# Patient Record
Sex: Female | Born: 1975 | Race: White | Hispanic: No | Marital: Married | Smoking: Never smoker
Health system: Southern US, Community
[De-identification: ages and names within clinical notes are randomized; demographics above are authoritative.]

## PROBLEM LIST (undated history)

## (undated) DIAGNOSIS — N83209 Unspecified ovarian cyst, unspecified side: Secondary | ICD-10-CM

## (undated) DIAGNOSIS — N809 Endometriosis, unspecified: Secondary | ICD-10-CM

## (undated) DIAGNOSIS — Q615 Medullary cystic kidney: Secondary | ICD-10-CM

## (undated) DIAGNOSIS — D8989 Other specified disorders involving the immune mechanism, not elsewhere classified: Secondary | ICD-10-CM

## (undated) DIAGNOSIS — I10 Essential (primary) hypertension: Secondary | ICD-10-CM

## (undated) DIAGNOSIS — N393 Stress incontinence (female) (male): Secondary | ICD-10-CM

## (undated) DIAGNOSIS — T7840XA Allergy, unspecified, initial encounter: Secondary | ICD-10-CM

## (undated) DIAGNOSIS — K5090 Crohn's disease, unspecified, without complications: Secondary | ICD-10-CM

## (undated) DIAGNOSIS — I7300 Raynaud's syndrome without gangrene: Secondary | ICD-10-CM

## (undated) DIAGNOSIS — O1490 Unspecified pre-eclampsia, unspecified trimester: Secondary | ICD-10-CM

## (undated) HISTORY — DX: Unspecified pre-eclampsia, unspecified trimester: O14.90

## (undated) HISTORY — PX: CHOLECYSTECTOMY: SHX55

## (undated) HISTORY — DX: Stress incontinence (female) (male): N39.3

## (undated) HISTORY — DX: Crohn's disease, unspecified, without complications: K50.90

## (undated) HISTORY — DX: Allergy, unspecified, initial encounter: T78.40XA

## (undated) HISTORY — DX: Endometriosis, unspecified: N80.9

## (undated) HISTORY — DX: Essential (primary) hypertension: I10

## (undated) HISTORY — PX: DILATION AND CURETTAGE OF UTERUS: SHX78

## (undated) HISTORY — PX: TUBAL LIGATION: SHX77

## (undated) HISTORY — DX: Medullary cystic kidney: Q61.5

## (undated) HISTORY — DX: Unspecified ovarian cyst, unspecified side: N83.209

## (undated) HISTORY — DX: Raynaud's syndrome without gangrene: I73.00

## (undated) HISTORY — PX: NOVASURE ABLATION: SHX5394

## (undated) HISTORY — PX: ENDOMETRIAL ABLATION: SHX621

---

## 2003-07-13 ENCOUNTER — Emergency Department (HOSPITAL_COMMUNITY): Admission: EM | Admit: 2003-07-13 | Discharge: 2003-07-14 | Payer: Self-pay | Admitting: Emergency Medicine

## 2004-02-16 ENCOUNTER — Inpatient Hospital Stay (HOSPITAL_COMMUNITY): Admission: AD | Admit: 2004-02-16 | Discharge: 2004-02-16 | Payer: Self-pay | Admitting: Obstetrics and Gynecology

## 2004-05-19 ENCOUNTER — Inpatient Hospital Stay (HOSPITAL_COMMUNITY): Admission: AD | Admit: 2004-05-19 | Discharge: 2004-05-19 | Payer: Self-pay | Admitting: Obstetrics and Gynecology

## 2004-05-20 ENCOUNTER — Inpatient Hospital Stay (HOSPITAL_COMMUNITY): Admission: AD | Admit: 2004-05-20 | Discharge: 2004-05-20 | Payer: Self-pay | Admitting: Obstetrics and Gynecology

## 2004-05-29 ENCOUNTER — Inpatient Hospital Stay (HOSPITAL_COMMUNITY): Admission: AD | Admit: 2004-05-29 | Discharge: 2004-06-06 | Payer: Self-pay | Admitting: Obstetrics & Gynecology

## 2004-06-11 ENCOUNTER — Inpatient Hospital Stay (HOSPITAL_COMMUNITY): Admission: AD | Admit: 2004-06-11 | Discharge: 2004-06-11 | Payer: Self-pay | Admitting: Obstetrics and Gynecology

## 2004-06-21 ENCOUNTER — Inpatient Hospital Stay (HOSPITAL_COMMUNITY): Admission: AD | Admit: 2004-06-21 | Discharge: 2004-06-23 | Payer: Self-pay | Admitting: Obstetrics and Gynecology

## 2004-08-15 ENCOUNTER — Ambulatory Visit (HOSPITAL_COMMUNITY): Admission: RE | Admit: 2004-08-15 | Discharge: 2004-08-15 | Payer: Self-pay | Admitting: Obstetrics and Gynecology

## 2007-10-17 ENCOUNTER — Ambulatory Visit (HOSPITAL_COMMUNITY): Admission: RE | Admit: 2007-10-17 | Discharge: 2007-10-17 | Payer: Self-pay | Admitting: Obstetrics and Gynecology

## 2007-10-17 ENCOUNTER — Encounter (INDEPENDENT_AMBULATORY_CARE_PROVIDER_SITE_OTHER): Payer: Self-pay | Admitting: Obstetrics and Gynecology

## 2009-07-02 ENCOUNTER — Ambulatory Visit (HOSPITAL_COMMUNITY): Admission: RE | Admit: 2009-07-02 | Discharge: 2009-07-02 | Payer: Self-pay | Admitting: General Surgery

## 2010-01-22 ENCOUNTER — Ambulatory Visit: Payer: Self-pay | Admitting: Family Medicine

## 2010-01-22 DIAGNOSIS — M9981 Other biomechanical lesions of cervical region: Secondary | ICD-10-CM | POA: Insufficient documentation

## 2010-01-22 DIAGNOSIS — I10 Essential (primary) hypertension: Secondary | ICD-10-CM | POA: Insufficient documentation

## 2010-01-22 DIAGNOSIS — M999 Biomechanical lesion, unspecified: Secondary | ICD-10-CM | POA: Insufficient documentation

## 2010-01-22 DIAGNOSIS — Q615 Medullary cystic kidney: Secondary | ICD-10-CM | POA: Insufficient documentation

## 2010-01-22 LAB — CONVERTED CEMR LAB
ALT: 12 units/L
AST: 13 units/L
Albumin: 4.4 g/dL
Alkaline Phosphatase: 82 units/L
BUN: 16 mg/dL
CO2: 25 meq/L
Calcium: 9 mg/dL
Chloride: 103 meq/L
Cholesterol: 150 mg/dL
Creatinine, Ser: 0.8 mg/dL
HCT: 42 %
HDL: 46 mg/dL
Hemoglobin: 14.1 g/dL
LDL Cholesterol: 87 mg/dL
MCV: 91.3 fL
Platelets: 298 10*3/uL
Potassium: 3.8 meq/L
RDW: 13 %
Sodium: 138 meq/L
Total Bilirubin: 0.4 mg/dL
Triglycerides: 86 mg/dL
VLDL: 17 mg/dL
WBC: 10.1 10*3/uL

## 2010-02-17 ENCOUNTER — Ambulatory Visit: Payer: Self-pay | Admitting: Family Medicine

## 2010-03-11 ENCOUNTER — Encounter
Admission: RE | Admit: 2010-03-11 | Discharge: 2010-04-07 | Payer: Self-pay | Source: Home / Self Care | Attending: Orthopedic Surgery | Admitting: Orthopedic Surgery

## 2010-03-30 ENCOUNTER — Encounter: Payer: Self-pay | Admitting: Obstetrics & Gynecology

## 2010-04-08 NOTE — Assessment & Plan Note (Signed)
Summary: new pt/employee/per Dr. Smith/adjustment/bmc   Vital Signs:  Patient profile:   35 year old female Height:      63.25 inches Weight:      133 pounds BMI:     23.46 BSA:     1.63 Temp:     99.2 degrees F Pulse rate:   84 / minute BP sitting:   121 / 69  Vitals Entered By: Jone Baseman CMA (January 22, 2010 9:17 AM) CC: NP/ manipulation Is Patient Diabetic? No Pain Assessment Patient in pain? yes     Location: right side Intensity: 3   Primary Care Parvin Stetzer:  Antoine Primas DO  CC:  NP/ manipulation.  History of Present Illness: 35 yo female here to establish care.  1. HTN   BP today: WNL Taking Meds:no notices exercise keeps control Side Effects: no ROS: denies Headache visual changes nausea vomiting abdominal pain numbness in extremities    2.  Medullary sponge kidney-  Pt had this found when she was having CT scan for nonspecific abdominal pain she has noticed when she has caffiene. Has seen a nephologist for it in the past.   3.  Back pain  Onset:gradaul usually worse at the end of a long shift.  Location: righ sided low Aggrevating factors: long shifts, lifting certain things.  Relieving factors: motrin Does pain radiate and where:sometime down the back of the hip any known hx of back pain:no [facility]  Impeding ADL's: no ROS: Denies loss of function of extremities, numbness  in extremities, bowel or bladder problems,  dysuria, SOB      Habits & Providers  Alcohol-Tobacco-Diet     Tobacco Status: never  Current Medications (verified): 1)  None  Allergies (verified): No Known Drug Allergies  Past History:  Past Medical History: medullary spnge kidney HTn no meds hx of depression resolved  Past Surgical History: ex lap for non specific abdominal pain Cholecystectomy BTL in 2006  Family History: breast cancer in mother 53 CAD in father HTN  Social History: married 3  children 2000, 2002, 2006 works at [employer] Status:  never  Review of Systems       denies fever, chills, nausea, vomiting, diarrhea or constipation   Physical Exam  General:  Well-developed,well-nourished,in no acute distress; alert,appropriate and cooperative throughout examination Eyes:  No corneal or conjunctival inflammation noted. EOMI. Perrla.  Ears:  External ear exam shows no significant lesions or deformities.  Otoscopic examination reveals clear canals, tympanic membranes are intact bilaterally without bulging, retraction, inflammation or discharge. Hearing is grossly normal bilaterally. Mouth:  MMM Neck:  supple no LAD Lungs:  CTAB Heart:  RRR no murmur Abdomen:  BS+, NT, ND no masses Msk:  5/5 strength in all extremities  OMT findings C2L C5R T1 eRS right, T5 RS right  L2 RS left Sacrum left on left.   Pulses:  2+ Extremities:  no edema   Impression & Recommendations:  Problem # 1:  NONALLOPATHIC LESION OF SACRAL REGION NEC (ICD-739.4) HVLA improved  standing and seated flexion test Orders: OMT 3-4 Body Regions (16109)  Problem # 2:  NONALLOPATHIC LESION OF CERVICAL REGION NEC (ICD-739.1) HVLA improved.  Orders: OMT 3-4 Body Regions [phone])  Problem # 3:  NONALLOPATHIC LESION OF THORACIC REGION NEC (ICD-739.2) HVLA improved all manipulation was improved after verbal consent was obtained.  Orders: OMT 3-4 Body Regions [phone])  Problem # 4:  ESSENTIAL HYPERTENSION, BENIGN (ICD-401.1) at goal on no medications  at this time.    Orders Added: 1)  [facility]- New Level 4 [99204] 2)  OMT 3-4 Body Regions [10272]

## 2010-04-10 NOTE — Assessment & Plan Note (Signed)
Summary: flu sx,df   Vital Signs:  Patient profile:   35 year old female Height:      63.25 inches Weight:      134 pounds BMI:     23.63 Temp:     97.7 degrees F oral Pulse rate:   91 / minute BP sitting:   128 / 87  (right arm) Cuff size:   regular  Vitals Entered By: Tessie Fass CMA (February 17, 2010 1:45 PM) CC: cough and congestion and headache Pain Assessment Patient in pain? yes     Location: head Intensity: 7   Primary Care Teauna Dubach:  Antoine Primas DO  CC:  cough and congestion and headache.  History of Present Illness: 6 days of sinus congestion getting worse instead of bettter.  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  Cough is not severe.  Deneis fever.  Current Medications (verified): 1)  Doxycycline Hyclate 100 Mg Tabs (Doxycycline Hyclate) .... One Tab Two Times A Day For 7 Days  Allergies (verified): No Known Drug Allergies  Review of Systems General:  Denies chills and fever. ENT:  Complains of hoarseness, nasal congestion, postnasal drainage, sinus pressure, and sore throat. Resp:  Denies cough.  Physical Exam  General:  Congested, in no acute distress Ears:  TM retracted Nose:  decreased air movement Mouth:  + post nasal draiange Neck:  no adenopathy Lungs:  normal respiratory effort and normal breath sounds.  Decreased breath sounds in right lower lobe  Heart:  normal rate and no murmur.     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  Recommended 12 hour nasal spray at bedtime for 2-3 nights. Her updated medication list for this problem includes:    Doxycycline Hyclate 100 Mg Tabs (Doxycycline hyclate) ..... One tab two times a day for 7 days  Orders: [facility]- Est Level  3 (04540)  Complete Medication List: 1)  Doxycycline Hyclate 100 Mg Tabs (Doxycycline hyclate) ....  One tab two times a day for 7 days  Patient Instructions: 1)  Drink a lot of fluids 2)  eat before doxycycline Prescriptions: DOXYCYCLINE HYCLATE 100 MG TABS (DOXYCYCLINE HYCLATE) one tab two times a day for 7 days  #14 x 0   Entered and Authorized by:   Luretha Murphy NP   Signed by:   Luretha Murphy NP on 02/17/2010   Method used:   Electronically to        [facility]* (retail)       [address]       [address] Shipping/mailing       [place], [place]  [address]       Ph: [phone]       Fax: [phone]   RxID:   [id number]    Orders Added: 1)  [facility]- Est Level  3 [72536]

## 2010-04-12 ENCOUNTER — Encounter: Payer: Self-pay | Admitting: *Deleted

## 2010-05-27 LAB — PREGNANCY, URINE: Preg Test, Ur: NEGATIVE

## 2010-07-22 NOTE — Op Note (Signed)
[name], [name]                ACCOUNT NO.:  [id number]   MEDICAL RECORD NO.:  [id number]          PATIENT TYPE:  AMB   LOCATION:  SDC                           [facility]   PHYSICIAN:  Lenoard Aden, M.D.DATE OF BIRTH:  Aug 05, 1975   DATE OF PROCEDURE:  DATE OF DISCHARGE:                               OPERATIVE REPORT   PREOPERATIVE DIAGNOSES:  1. Dysmenorrhea.  2. Menorrhagia.  3. Right lower quadrant pain.   POSTOPERATIVE DIAGNOSES:  1. Dysmenorrhea.  2. Menorrhagia.  3. Right lower quadrant pain of unknown etiology.   PROCEDURES:  1. Diagnostic hysteroscopy.  2. D&C.  3. NovaSure endometrial ablation.  4. Diagnostic laparoscopy.   SURGEON:  Lenoard Aden, MD   ANESTHESIA:  General.   ESTIMATED BLOOD LOSS:  Less than 50 mL.   FLUID DEFICIT:  Less than 50 mL.   COMPLICATIONS:  None.   SPECIMEN:  Endometrial curettings to pathology.   The patient to recovery in good condition.   BRIEF OPERATIVE NOTE:  After being apprised of risks of anesthesia,  infection, bleeding, injury to abdominal organs, need for repair, the  labor's immediate complications to include bowel and bladder injury, and  inability to cure pelvic pain, the patient was brought to the operating  room where she was administered a general anesthetic without  complications.  She was prepped and draped in the usual sterile fashion  and catheterized until the bladder was emptied.  After achieving  adequate anesthesia, a weighted speculum placed per vagina.  Dilute  Pitressin solution was placed at 3 and 9 o'clock cervicovaginal  junction.  Cervical length of 2 and uterine length of 8 cm were noted.  Cervix was easily dilated up to a #25 Pratt dilator.  Hysteroscope  placed.  Visualization reveals a thin endometrium, and otherwise, normal  endometrial cavity.  Hysteroscope was removed.  Endometrium curettings  were collected in a normal 4-quadrant method using sharp curettage.  Good  hemostasis was noted.  Revisualization reveals an otherwise normal  endometrial cavity.  Hysteroscope was removed.  NovaSure was placed and  seated in appropriate fashion.  CO2 test was performed and is negative.  Procedure was initiated in a standard fashion.  NovaSure was completed  without difficulty and removed atraumatically.  Revisualization reveals  an otherwise normal endometrial cavity.  Post-ablation, no evidence of  uterine perforation.  Hulka tenaculum placed.  Attention turned to the  abdomen whereby an infraumbilical incision was made with a scalpel.  Veress needle placed with an opening pressure of -1 is noted.  A 3.5 L  of CO2 was insufflated  without difficulty.  Trocars placed  atraumatically.  Visualization reveals a normal liver, gallbladder bed,  normal appendix, normal bowel, and there were some bilateral tubal  ligation site as noted.  Normal anterior and posterior cul-de-sac.  Normal left and right ovary.  Small simple cyst appearing on the left  ovary.  At this time, revisualization reveals no evidence of uterine  perforation and normal cul-de-sac was noted.  A 5-mm trocar site, which  was made suprapubically under direct visualization.  This was used to  manipulate bowel and the ovaries.  Both ureters were peristalsing  normally.  No evidence of any adhesions into the pelvic and cul-de-sac.  At this time, instruments were then removed under direct visualization  CO2 having been released.  Incisions were closed using a 0 Vicryl and  Dermabond.  Instruments were then removed from the vagina.  The patient  was awakened and transferred to recovery in good condition.      Lenoard Aden, M.D.  Electronically Signed     RJT/MEDQ  D:  10/17/2007  T:  10/18/2007  Job:  [id number]

## 2010-07-25 NOTE — Consult Note (Signed)
[name], [name]                ACCOUNT NO.:  [id number]   MEDICAL RECORD NO.:  [id number]          PATIENT TYPE:  MAT   LOCATION:  MATC                          [facility]  [facility]   PHYSICIAN:  Lenoard Aden, M.D.DATE OF BIRTH:  1975/11/17   DATE OF CONSULTATION:  06/11/2004  DATE OF DISCHARGE:                                   CONSULTATION   EMERGENCY ROOM CONSULTATION:   CHIEF COMPLAINT:  Acute onset of blurred vision.   HISTORY OF PRESENT ILLNESS:  [age]-year-old white female G5, P2 EDD Jul 11, 2004 at 35-6/[redacted] weeks gestation with known chronic hypertension versus  pregnancy-induced hypertension who presents with aforementioned neurologic  symptoms.  She denies headaches or epigastric pain, nausea, vomiting.  She  reports good fetal movement.  She has medications to include labetalol and  prenatal vitamins.  She has a history of two vaginal deliveries and two  pregnancy losses.  She does have a history of hypertension with both  pregnancies in addition to preterm contractions.   Prenatal course has been complicated by probable gestational versus chronic  hypertension and previously abnormal 24-hour urine which was recently  normalized at less than 300 mg in a 24-hour specimen.  Normal PIH labs have  been previously noted.   PHYSICAL EXAMINATION:  She is a well-developed, well-nourished white female  in no acute distress.  Blood pressure ranging from 120s/80s to one value of  150/100 upon presentation.  Lungs clear.  Heart regular rhythm.  Abdomen  soft, no __________ tenderness, gravid, nontender.  DTRs 2 to 3+, no clonus,  trace pretibial edema.  Cervix is 2 cm, 70%, vertex, -1 and posterior.  Neurologic exam is nonfocal.  Peripheral vision changes have improved at  this time.   NST is reactive.  CBC within normal limits.  Platelet count of 180,000.  Chemistries are pending.   IMPRESSION:  Chronic versus gestational hypertension, rule out superimposed  preeclampsia, stabilization of blood pressure noted.   PLAN:  Check chemistry panel.  If normal we will discharge home on continued  bed rest, labetalol and follow up for 24-hour urine as scheduled in the  office within 24 hours.      RJT/MEDQ  D:  06/11/2004  T:  06/11/2004  Job:  [id number]

## 2010-07-25 NOTE — Op Note (Signed)
[name], [name]                ACCOUNT NO.:  [id number]   MEDICAL RECORD NO.:  [id number]          PATIENT TYPE:  AMB   LOCATION:  SDC                           [facility]   PHYSICIAN:  Lenoard Aden, M.D.DATE OF BIRTH:  [birth year]   DATE OF PROCEDURE:  08/15/2004  DATE OF DISCHARGE:                                 OPERATIVE REPORT   PREOPERATIVE DIAGNOSIS:  Desire for elective sterilization.   POSTOPERATIVE DIAGNOSIS:  Desire for elective sterilization.   PROCEDURE:  Laparoscopic tubal ligation.   SURGEON:  Lenoard Aden, M.D.   ANESTHESIA:  General.   ESTIMATED BLOOD LOSS:  Less than 50 mL.   COMPLICATIONS:  None.   DRAINS:  None.   COUNTS:  Correct.   Patient to recovery in good condition.   FINDINGS:  Normal uterus.  Bilateral normal tubes and ovaries.  Normal-  appearing appendix.  Normal liver, gallbladder bed.  No evidence of  adhesions.   BRIEF OPERATIVE NOTE:  After being apprised of the risks and benefits of  anesthesia, infection, bleeding, injury to abdominal organs and need for  repair, failure rate of tubal ligation five to 10 per 1000, the patient was  brought to the operating room, where she was administered a general  anesthetic without complications, prepped and draped in the usual sterile  fashion, catheterized until the bladder is empty.  Exam under anesthesia  reveals an anteflexed uterus and no adnexal masses.  A Hulka tenaculum  placed per vagina.  An infraumbilical incision made with the scalpel, a  Veress needle placed, opening pressure of -1 noted, 4.2 L of CO2 insufflated  without difficulty.  Atraumatic trocar placement visualized, pictures taken.  Findings as noted above.  Right tube traced out to the fimbriated end,  cauterized with bipolar cautery in three contiguous portions of the  ampullary-isthmic portion of the tube.  The same procedure done on the left  tube.  Both tubes are divided, tubal lumens are visualized.  Good  hemostasis  is noted.  CO2 is released.  All instruments are removed under direct visualization,  incision closed using a 0 Vicryl and Dermabond.  Marcaine solution placed 10  mL total.  Instruments removed from the vagina.  The patient tolerates the  procedure well, transferred to recovery in good condition.       RJT/MEDQ  D:  08/15/2004  T:  08/15/2004  Job:  [id number]

## 2010-07-25 NOTE — Consult Note (Signed)
NAMEPHILICIA, [name]                            ACCOUNT NO.:  [id number]   MEDICAL RECORD NO.:  [id number]                   PATIENT TYPE:  EMS   LOCATION:  MAJO                                 FACILITY:  [facility]   PHYSICIAN:  Genene Churn. Love, M.D.                 DATE OF BIRTH:  08-18-1975   DATE OF CONSULTATION:  07/14/2003  DATE OF DISCHARGE:  07/14/2003                                   CONSULTATION   REFERRING PHYSICIAN:  Lorre Nick, M.D.   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  Seen in the  emergency room at the request of Lorre Nick, M.D., for evaluation of  blurred vision and abnormal CT scan.   HISTORY OF PRESENT ILLNESS:  This patient has been in good health her entire  life.  About 7:15 p.m., she felt pressured while at work.  She noted a glare  to the computer screen.  She had poor ability to focus.  She noted a  lightheaded sensation, fussy vision in her left eye, and a sensation as if  she was going to faint.  She was seen at the [facility] Emergency Room  by a P.A. with diastolic blood pressures of 102 and was referred to [facility].  A CT scan showed evidence of calcification versus blood in  the right basal ganglion region of the CT and neurology was called.  The  patient has had no evidence of migraine.  Denies any family history of  aneurysms.  She has possibly had some migraines in the past, but no car  sickness as a child.   PAST MEDICAL HISTORY:  Significant for:  1. Preeclampsia.  2. Endometriosis.   CURRENT MEDICATIONS:  Her only current medication is Ortho Evra birth  control patch.   SOCIAL HISTORY:  She drinks about one drink of alcohol per month.  She is  married and has two children.  She does not smoke cigarettes.   PHYSICAL EXAMINATION:  A well-developed white female.  Blood pressure  sitting in right and leg arms 120/80, heart  rate 64 and regular.  No bruits  were heard.  On mental status, she was alert and oriented x 3.  She followed  one, two, and three-step commands.  The cranial nerve examination revealed  visual fields full.  Disks flat.  Extraocular movements full.  Corneals  full.  Facial sensation equal.  No facial motor asymmetry.  Hearing intact.  Air conduction greater than bone conduction.  Tongue midline.  Uvula  midline.  Gags present.  Sternocleidomastoid and trapezius testing normal.  Motor examination with 5/5 strength proximally and distally in the upper and  lower extremities.  No evidence of drift.  Coordination testing normal.  Sensory examination intact.  Deep tendon reflexes 1-2+.  Plantar responses  downgoing.  Gait examination  not evaluated.   LABORATORY DATA:  EKG showed normal sinus rhythm and right axis deviation.  Her white blood cell count was 12,500, hemoglobin 12.7, hematocrit 37.2, and  platelet count 307,000.  Sodium 139, potassium 3.4, chloride 106, CO2  content 25, glucose 111, BUN 9, creatinine 0.8, calcium 8.7.  Urinalysis was  unremarkable.  MRI study of the brain showed no evidence of mastoiditis.  CT  scan showed question of a small hemorrhage versus calcification in the basal  ganglion.   IMPRESSION:  1. Visual disturbance possibly representing migraine aura with blurred     vision.  368.40  2. Abnormal CT scan not showing abnormality on MRI.   PLAN:  1. Allow the patient to return home.  2. She will follow up on a p.r.n. basis.                                               Genene Churn. Sandria Manly, M.D.    JML/MEDQ  D:  07/14/2003  T:  07/15/2003  Job:  [id number]

## 2010-07-25 NOTE — Discharge Summary (Signed)
[name], [name]                ACCOUNT NO.:  [id number]   MEDICAL RECORD NO.:  [id number]          PATIENT TYPE:  INP   LOCATION:  9155                          [facility]   PHYSICIAN:  Lenoard Aden, M.D.DATE OF BIRTH:  October 30, 1975   DATE OF ADMISSION:  05/29/2004  DATE OF DISCHARGE:  06/06/2004                                 DISCHARGE SUMMARY   HOSPITAL COURSE:  The patient underwent admission for pregnancy-induced  hypertension and preterm contractions on May 29, 2004. Expectant  management within normal limits. Labs normalized, 24-hour urine improved.  Fetal heart rate status within normal limits. She was discharged to home on  hospital day #8. Follow up in the office in 1 week. Discharge medications  include labetalol, prenatal vitamins. Follow up in the office within 1 week  as noted.      [name]/MEDQ  D:  08/05/2004  T:  08/05/2004  Job:  [id number]

## 2010-07-25 NOTE — H&P (Signed)
[name], [name]                ACCOUNT NO.:  [id number]   MEDICAL RECORD NO.:  [id number]          PATIENT TYPE:  INP   LOCATION:  9155                          [facility]   PHYSICIAN:  Lenoard Aden, M.D.DATE OF BIRTH:  May 30, 1975   DATE OF ADMISSION:  05/29/2004  DATE OF DISCHARGE:                                HISTORY & PHYSICAL   HISTORY OF PRESENT ILLNESS:  A 35 year old G5 P2, [redacted] weeks gestation; EDD  Jul 11, 2004; with mild preeclampsia and new onset blood pressure lability.  Medications include prenatal vitamins, Zoloft, Procardia XL, and Zantac.  Past obstetric history is remarkable for two uncomplicated pregnancy losses  and two vaginal deliveries complicated by preterm labor and pregnancy-  induced hypertension. She has family history of hypertension, heart disease,  birth defects, and she does have a personal history of chickenpox as a  child.   PHYSICAL EXAMINATION:  VITAL SIGNS:  Weight is 160, blood pressure 150/104,  no proteinuria noted.  HEENT:  Normal.  LUNGS:  Clear.  HEART:  Regular rate and rhythm.  ABDOMEN:  Soft, gravid, nontender. Estimated fetal weight on May 15, 2004  of 4 pounds 8 ounces.  PELVIC:  Cervix is closed, soft, 2 cm long, vertex, -1.  EXTREMITIES:  DTRs 2-3+, no evidence of clonus.  NEUROLOGIC:  Nonfocal.   The patient is status post betamethasone on March 13 and March 14.   IMPRESSION:  1.  Thirty-four-week OB.  2.  Mild preeclampsia, questionable element of chronic hypertension with      blood pressure lability.  3.  Previous history of pregnancy-induced hypertension.  4.  Situational depression, stable on Zoloft.  5.  Chronic gastroesophageal reflux, stable on Zantac.   PLAN:  Check PIH panel and 24-hour urine today. Continue Procardia XL. If  PIH panel and 24-hour urine are stable, consider adding labetalol for blood  pressure lability. Continue serial PIH labs and repeat 24-hour urine as  noted.      RJT/MEDQ  D:   05/29/2004  T:  05/29/2004  Job:  [id number]

## 2010-07-25 NOTE — H&P (Signed)
[name], [name]                ACCOUNT NO.:  [id number]   MEDICAL RECORD NO.:  [id number]          PATIENT TYPE:  INP   LOCATION:  9169                          [facility]   PHYSICIAN:  Lenoard Aden, M.D.DATE OF BIRTH:  01/25/1976   DATE OF ADMISSION:  06/21/2004  DATE OF DISCHARGE:                                HISTORY & PHYSICAL   CHIEF COMPLAINT:  Gestational hypertension versus chronic hypertension now  at 37 weeks for indicated induction.   HISTORY OF PRESENT ILLNESS:  The patient is a 35 year old white female, G5,  P2-0-2-2, EDD Jul 11, 2004 at [redacted] weeks gestation with labile hypertension on  labetalol for induction.   OBSTETRICAL HISTORY:  Remarkable for two vaginal deliveries complicated by  preterm labor, one complicated by preeclampsia currently with magnesium  required after one of these vaginal deliveries.   FAMILY HISTORY:  Noncontributory.   SOCIAL HISTORY:  Noncontributory.   PRENATAL LABORATORY DATA:  Uncomplicated.   MEDICATIONS:  Zoloft, Labetalol, Pepcid, and prenatal vitamins.   HOSPITAL COURSE:  Preterm course complicated by hospitalization for elevated  blood pressures and proteinuria. Previous proteinuria was greater than 300  mg and a 24-hour specimen consistent with possible mild preeclampsia and  subsequent proteinuria under 300 mg 24-hour specimen. All labs previously  within normal limits. She was placed on Procardia early for increased  contractions and blood pressure control and switched to labetalol after  headaches due to her Procardia were noted.   PHYSICAL EXAMINATION:  VITAL SIGNS:  Today on exam, blood pressure 135/89.  HEENT:  Normal.  LUNGS:  Clear.  HEART:  Regular rate and rhythm.  ABDOMEN:  Soft, gravid, and nontender. Fetal weight 7 pounds.  PELVIC:  Cervix is 2-3 cm, 2 cm on vertex, -1.  EXTREMITIES:  Without cords.  NEUROLOGICAL:  Nonfocal.   IMPRESSION:  Gestational hypertension versus chronic hypertension, now  at  term on labetalol.   PLAN:  Proceed with induction, magnesium, on prophylaxis as needed. Continue  labetalol as needed postpartum. The patient desires postpartum tubal  ligation. Risks, benefits have been discussed. If blood pressure is well  controlled, we will consider postpartum tubal ligation. Risks of anesthesia,  infection, bleeding, injury to abdominal organs, need for repair previously  discussed. Failure risk of tubal ligation 5 to 10:1000 noted. The patient  acknowledges and will proceed.      RJT/MEDQ  D:  06/21/2004  T:  06/21/2004  Job:  [id number]

## 2010-07-25 NOTE — H&P (Signed)
[name], [name]                ACCOUNT NO.:  [id number]   MEDICAL RECORD NO.:  [id number]           PATIENT TYPE:   LOCATION:                                 FACILITY:   PHYSICIAN:  Lenoard Aden, M.D.     DATE OF BIRTH:   DATE OF ADMISSION:  08/15/2004  DATE OF DISCHARGE:                                HISTORY & PHYSICAL   CHIEF COMPLAINT:  Desire for elective sterilization.   HISTORY OF PRESENT ILLNESS:  The patient is a 35 year old white female, G5,  P3-0-2-3, status post uncomplicated vaginal delivery, June 21, 2004, for  tubal ligation.  History of 3 vaginal deliveries.   FAMILY HISTORY AND SOCIAL HISTORY:  Noncontributory.   PRENATAL LABORATORY DATA:  Uncomplicated.   MEDICATIONS:  1.  Zoloft.  2.  Prenatal vitamins.   PHYSICAL EXAMINATION:  GENERAL:  She is a well-developed, well-nourished,  white female in no acute distress.  HEENT:  Normal.  LUNGS:  Clear.  HEART:  Regular rate and rhythm.  ABDOMEN:  Soft, nontender.  PELVIC:  A small anteflexed uterus and no adnexal masses.   IMPRESSION:  Desire for elective sterilization.   PLAN:  Proceed with laparoscopic tubal ligation.  Risks of anesthesia,  infection, bleeding, injury to abdominal organs and need for repair was  discussed.  Delayed versus immediate complications to include bowel and  bladder injury noted with possible need for repair, failure risk of tubal  ligation of 5-10 per 1000 noted.  The patient acknowledges and wishes to  proceed.       RJT/MEDQ  D:  08/14/2004  T:  08/14/2004  Job:  [id number]

## 2010-08-14 ENCOUNTER — Ambulatory Visit (INDEPENDENT_AMBULATORY_CARE_PROVIDER_SITE_OTHER): Payer: 59 | Admitting: Family Medicine

## 2010-08-14 ENCOUNTER — Encounter: Payer: Self-pay | Admitting: Family Medicine

## 2010-08-14 DIAGNOSIS — J309 Allergic rhinitis, unspecified: Secondary | ICD-10-CM | POA: Insufficient documentation

## 2010-08-14 DIAGNOSIS — A499 Bacterial infection, unspecified: Secondary | ICD-10-CM

## 2010-08-14 DIAGNOSIS — B9689 Other specified bacterial agents as the cause of diseases classified elsewhere: Secondary | ICD-10-CM

## 2010-08-14 DIAGNOSIS — J329 Chronic sinusitis, unspecified: Secondary | ICD-10-CM

## 2010-08-14 MED ORDER — FLUTICASONE FUROATE 27.5 MCG/SPRAY NA SUSP: 2.0000 | Freq: Every day | NASAL | Status: DC

## 2010-08-14 MED ORDER — FEXOFENADINE HCL 60 MG PO TABS: 60.0000 mg | ORAL_TABLET | Freq: Two times a day (BID) | ORAL | Status: DC

## 2010-08-14 MED ORDER — AMOXICILLIN-POT CLAVULANATE 875-125 MG PO TABS: 1.0000 | ORAL_TABLET | Freq: Two times a day (BID) | ORAL | Status: AC

## 2010-08-14 NOTE — Assessment & Plan Note (Signed)
Acute secondary bacterial sinusitis in setting of baseline viral URI. Will treat with 7 day course of augmentin. Will also start pt on flonase and allegra in setting of baseline allergic rhinitis. Stressed importance of daily compliance of chronic medications. Red flags discussed. Pt agreeable to plan.

## 2010-08-14 NOTE — Assessment & Plan Note (Signed)
flonase and allegra in setting of baseline allergic rhinitis. Stressed importance of daily compliance of chronic medications

## 2010-08-14 NOTE — Progress Notes (Signed)
  Subjective:    Patient ID: [name], female    DOB: June 25, 1975, 35 y.o.   MRN: [id number]  HPI Sinusitis x 3-4 weeks. Initially had viral URI 1-2 weeks ago that mildly resolved. Pt then noticed recurrence of sinus pressure and congestion. + Purulent sinus drainage. + Purulent post nasal drip. Mild cough. No fevers. Mild chills. Pain most prominent over R maxillary area. Has had recurrence of baseline migraines x 4-5 days since recurrence of sinus pressure. + baseline hx/o allergic rhinitis. Has used antihistamines and nasal steroids intermittently in the past. Pt is non-smoker.    Review of Systems See HPI     Objective:   Physical Exam Gen: up in chair, mildly ill appearing. HEENT: TMs WNL bilaterally, + nasal erythema and turbinate swelling bilaterally, + TTP across R maxillary sinus, + post oropharyngeal erythema PULM: CTAB     Assessment & Plan:  Acute secondary bacterial sinusitis in setting of baseline viral URI. Will treat with 7 day course of augmentin. Will also start pt on flonase and allegra in setting of baseline allergic rhinitis. Stressed importance of daily compliance of chronic medications. Red flags discussed. Pt agreeable to plan.

## 2010-08-14 NOTE — Patient Instructions (Signed)
Allergic Rhinitis Allergic rhinitis is when the mucous membranes in the nose respond to allergens. Allergens are particles in the air that cause your body to have an allergic reaction. This causes you to release allergic antibodies. Through a chain of events, these eventually cause you to release histamine into the blood stream (hence the use of antihistamines). Although meant to be protective to the body, it is this release that causes your discomfort, such as frequent sneezing, congestion and an itchy runny nose.  CAUSES The pollen allergens may come from grasses, trees, and weeds. This is seasonal allergic rhinitis, or "hay fever." Other allergens cause year-round allergic rhinitis (perennial allergic rhinitis) such as house dust mite allergen, pet dander and mold spores.  SYMPTOMS  Nasal stuffiness (congestion).   Runny, itchy nose with sneezing and tearing of the eyes.   There is often an itching of the mouth, eyes and ears.  It cannot be cured, but it can be controlled with medications. DIAGNOSIS If you are unable to determine the offending allergen, skin or blood testing may find it. TREATMENT  Avoid the allergen.   Medications and allergy shots (immunotherapy) can help.   Hay fever may often be treated with antihistamines in pill or nasal spray forms. Antihistamines block the effects of histamine. There are over-the-counter medicines that may help with nasal congestion and swelling around the eyes. Check with your caregiver before taking or giving this medicine.  If the treatment above does not work, there are many new medications your caregiver can prescribe. Stronger medications may be used if initial measures are ineffective. Desensitizing injections can be used if medications and avoidance fails. Desensitization is when a patient is given ongoing shots until the body becomes less sensitive to the allergen. Make sure you follow up with your caregiver if problems continue. SEEK  MEDICAL CARE IF:   You develop fever (more than 100.5F (38.1 C).   You develop a cough that does not stop easily (persistent).   You have shortness of breath.   You start wheezing.   Symptoms interfere with normal daily activities.  Document Released: 11/18/2000 Document Re-Released: 03/17/2009 [employer] Patient Information 2011 [employer]. 

## 2010-10-07 ENCOUNTER — Ambulatory Visit: Payer: 59

## 2010-10-07 ENCOUNTER — Ambulatory Visit (INDEPENDENT_AMBULATORY_CARE_PROVIDER_SITE_OTHER): Payer: 59 | Admitting: Family Medicine

## 2010-10-07 ENCOUNTER — Encounter: Payer: Self-pay | Admitting: Family Medicine

## 2010-10-07 VITALS — BP 130/86 | HR 92 | Temp 98.2°F | Ht 63.25 in | Wt 133.5 lb

## 2010-10-07 DIAGNOSIS — M5459 Other low back pain: Secondary | ICD-10-CM | POA: Insufficient documentation

## 2010-10-07 DIAGNOSIS — M549 Dorsalgia, unspecified: Secondary | ICD-10-CM

## 2010-10-07 MED ORDER — CYCLOBENZAPRINE HCL 10 MG PO TABS: 10.0000 mg | ORAL_TABLET | Freq: Three times a day (TID) | ORAL | Status: AC | PRN

## 2010-10-07 NOTE — Progress Notes (Deleted)
  Subjective:    Patient ID: [name], female    DOB: 04-Dec-1975, 35 y.o.   MRN: [id number]  HPI  Review of Systems  Objective:   Physical Exam Subjective:    [name] is a 35 y.o. female who presents for evaluation of low back pain. The patient has had recurrent self limited episodes of low back pain in the past. Symptoms have been present for {1-10:13787} *** and are {clinical course - history:17::"unchanged"}.  Onset was related to / precipitated by {causes; back pain:32249::"no known injury"}. The pain is located in the {back pain location:31199} and {radiation:20410}. The pain is described as {pain quality:31200} and occurs {timing:31009}. {Pain rating:20411} Symptoms are exacerbated by {causes; aggravators pain back:31424}. Symptoms are improved by {pain treatments:32172}. She has also tried {pain treatments:32172} which provided no symptom relief. She has {back pain associated symptoms neuro:31426} associated with the back pain. {red flag Hx:20412}  {Common ambulatory SmartLinks:19316}  Review of Systems {Ros - Complete:30496}       {Exam; back exam:5796::"Full range of motion without pain, no tenderness, no spasm, no curvature.","Normal reflexes, gait, strength and negative straight-leg raise."}    Assessment:    {back diagnosis:16452}    Plan:Negative     {Plan; back pain:10213} Subjective:    [name] is a 35 y.o. female  Who presents with one week history of low back pain. The patient first noted symptoms 7 days ago. It was related to no known injury and intense floor exercises.. The pain is rated moderate, unremitting, and is located at the across the lower back. The pain is described as aching and soreness and occurs all day. The symptoms are not progressive. Symptoms are exacerbated by exercise and flexion. Factors which relieve the pain include heat, ice, NSAIDs and rest. Other associated symptoms include weakness in the right leg. Previous history of  symptoms: back pain related to shortened L leg requiring manipulations.  Treatment efforts have included rest, ice, OTC NSAIDS and heat, with and without relief.  Outside reports reviewed: none.  Review of Systems Pertinent items are noted in HPI.    Objective:     General :    alert, cooperative and appears stated age  Gait:  {gait:140007::"Normal"}.   Tenderness:   absent  Edema:   absent.  Back ROM:  flexion 100, extension +30, lateral rotation 90/90, lateral bending 50/50. Pain reproducible on flexion.   Extension:   +30        Leg Lengths:   Left leg shorter by  1 cm                    Negative lying and sitting straight leg raising test.     Assessment:    Low back pain, likely secondary to muscle strain.     Plan:    I discussed the following treatment options: Exercise options discussed and encouraged Activity modifications discussed and recommended Medication options discussed and recommended      Assessment & Plan:   No problem-specific assessment & plan notes found for this encounter.

## 2010-10-07 NOTE — Patient Instructions (Signed)
Back Exercises Back exercises help treat and prevent back injuries. The goal of back exercises is to increase the strength of your abdominal and back muscles and the flexibility of your back. These exercises should be started when you no longer have back pain. Back exercises include: 1. Pelvic Tilt - Lie on your back with your knees bent. Tilt your pelvis until the lower part of your back is against the floor. Hold this position 5-10 sec and repeat 5-10 times.  2. Knee to Chest - Pull first one knee up against your chest and hold for 20-30 seconds, repeat this with the other knee, and then both knees. This may be done with the other leg straight or bent, whichever feels better.  3. Sit-Ups or Curl-Ups - Bend your knees 90 degrees. Start with tilting your pelvis, and do a partial, slow sit-up, lifting your trunk only 30-45 degrees off the floor. Take at least 2-3 sec for each sit-up. Do not do sit-ups with your knees out straight. If partial sit-ups are difficult, simply do the above but with only tightening your abdominal muscles and holding it as directed.  4. Hip-Lift - Lie on your back with your knees flexed 90 degrees. Push down with your feet and shoulders as you raise your hips a couple inches off the floor; hold for 10 sec, repeat 5-10 times.  5. Back arches - Lie on your stomach, propping yourself up on bent elbows. Slowly press on your hands, causing an arch in your low back. Repeat 3-5 times. Any initial stiffness and discomfort should lessen with repetition over time.  6. Shoulder-Lifts - Lie face down with arms beside your body. Keep hips and torso pressed to floor as you slowly lift your head and shoulders off the floor.  Do not overdo your exercises, especially in the beginning. Exercises may cause you some mild back discomfort which lasts for a few minutes; however, if the pain is more severe, or lasts for more than 15 minutes, do not continue exercises until you see your caregiver.  Improvement with exercise therapy for back problems is slow.   See your caregivers for assistance with developing a proper back exercise program. Document Released: 04/02/2004 Document Re-Released: 05/22/2008 [facility] Patient Information 2011 [place], [place].  Please take flexeril and use heating.  Please start low impact exercise in 2 days.  Continue ibuprofen up to 2400 mg/day. I recommend 400 mg every 6 hrs. Use lowest effective dose for shortest time.  F/u in 2 weeks with Dr. Katrinka Blazing. Return sooner if symptoms worsen.   -Dr. Armen Pickup

## 2010-10-08 NOTE — Progress Notes (Signed)
Subjective:    [name] is a 35 y.o. female who presents for evaluation of low back pain. The patient has had . Symptoms have been present for 7 day and are unchanged.  Onset was related to / precipitated by no known injury and abdominal exercises (leg lifts) on the floor. . The pain is located in the across the lower back and does not radiate. The pain is described as soreness and occurs all day. She rates her pain as moderate. Symptoms are exacerbated by flexion. Symptoms are improved by heat and ice. She has also tried NSAIDs which provided slight symptom relief. She has no other symptoms associated with the back pain.  No red flags indicative of potentially complicated back pain..    Review of Systems Pertinent items are noted in HPI.    Objective:   Full range of motion without pain, no tenderness, no spasm, no curvature. Normal gait, strength and negative straight-leg raise.    Assessment:    Nonspecific acute low back pain    Plan:    Natural history and expected course discussed. Questions answered. Stretching exercises discussed. Short (2-4 day) period of relative rest recommended until acute symptoms improve. Ice to affected area as needed for local pain relief. Heat to affected area as needed for local pain relief. NSAIDs per medication orders. Muscle relaxants per medication orders. Follow-up in 1 week as needed.

## 2010-10-08 NOTE — Assessment & Plan Note (Signed)
Acute low back pain 2/2 to strain. No evidence of nerve impingement.  Plan: moderate exercise (examples provided) in 2 days, continue heat, motrin for next few days, flexeril prn. If symptoms fail to improve in next week. RTC for evaluation. Consider PT referral at this time.

## 2010-12-05 LAB — CBC
HCT: 38.9
Hemoglobin: 13.1
MCHC: 33.8
MCV: 89.9
Platelets: 310
RBC: 4.32
RDW: 12.8
WBC: 8.7

## 2010-12-05 LAB — HCG, SERUM, QUALITATIVE: Preg, Serum: NEGATIVE

## 2011-01-13 ENCOUNTER — Encounter: Payer: Self-pay | Admitting: Family Medicine

## 2011-01-13 ENCOUNTER — Ambulatory Visit (INDEPENDENT_AMBULATORY_CARE_PROVIDER_SITE_OTHER): Payer: 59 | Admitting: Family Medicine

## 2011-01-13 VITALS — BP 131/84 | HR 81 | Temp 98.2°F | Wt 134.2 lb

## 2011-01-13 DIAGNOSIS — A499 Bacterial infection, unspecified: Secondary | ICD-10-CM

## 2011-01-13 DIAGNOSIS — J329 Chronic sinusitis, unspecified: Secondary | ICD-10-CM

## 2011-01-13 DIAGNOSIS — B9689 Other specified bacterial agents as the cause of diseases classified elsewhere: Secondary | ICD-10-CM

## 2011-01-13 MED ORDER — AMOXICILLIN-POT CLAVULANATE 875-125 MG PO TABS: 1.0000 | ORAL_TABLET | Freq: Two times a day (BID) | ORAL | Status: AC

## 2011-01-13 MED ORDER — FLUCONAZOLE 150 MG PO TABS: 150.0000 mg | ORAL_TABLET | Freq: Once | ORAL | Status: AC

## 2011-01-13 NOTE — Progress Notes (Signed)
  Subjective:    Patient ID: VALENA IVANOV, female    DOB: 1975/09/28, 35 y.o.   MRN: [id number]  HPI 35 year old female coming in with worsening sinus pressure. Patient was seen by Dr. Ezzard Standing in the springtime for sinus infection was treated and seemed to improve without much problems. Patient though has a history of allergic rhinitis has not been taking her medications on a regular basis. Over the course of last week she has had much more fullness and sinus pressure on the right side with some tooth pain denies any visual changes denies any fevers or chills patient is still able to eat and denies any type of shortness of breath or chest pain. Patient states that she was having some discharge from the nose earlier which was yellow to green in color no blood since that time though she just and stuffed up.   Review of Systems As stated above    past medical surgical social and family history reviewed with no changes Objective:   Physical Exam General: No apparent distress patient though has fullness of the right maxillary compared to contralateral side. Head: Patient is severely tender to percussion over the right maxillary sinus Tympanic membranes bilaterally have fluid level but nonbulging non-erythemic Patient has a postnasal drip Cardiovascular: Regular rate and rhythm no murmur Pulmonary: Clear to auscultation bilaterally    Assessment & Plan:

## 2011-01-13 NOTE — Patient Instructions (Signed)
Take augmentin for next 10  Days I am giving you diflucan just in case Come back when you nee me.

## 2011-01-13 NOTE — Assessment & Plan Note (Addendum)
Will treat  At this time. Patient does give a history of daughter having strep throat recently so we will broaden coverage and use Augmentin at this time. 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 Patient has history of yeast infections after antibiotic use we'll give patient a prescription for Diflucan

## 2011-04-28 ENCOUNTER — Ambulatory Visit (INDEPENDENT_AMBULATORY_CARE_PROVIDER_SITE_OTHER): Payer: 59 | Admitting: Family Medicine

## 2011-04-28 ENCOUNTER — Encounter: Payer: Self-pay | Admitting: Family Medicine

## 2011-04-28 DIAGNOSIS — M999 Biomechanical lesion, unspecified: Secondary | ICD-10-CM

## 2011-04-28 DIAGNOSIS — M9981 Other biomechanical lesions of cervical region: Secondary | ICD-10-CM

## 2011-04-28 NOTE — Progress Notes (Signed)
  Subjective:    Patient ID: [name], female    DOB: [birth year], 36 y.o.   MRN: [id number]  HPI Patient is coming in with right shoulder pain. 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 Patient also denies any chest pain or shortness of breath. Patient also complains of significant amount of pain on the right side of her body. Patient has had this before.  Review of Systems As stated in the history of present illness patient also denies any fever, chills, abdominal pain or weight loss.    Objective:   Physical Exam Gen: No apparent distress  Shoulder: Right Inspection reveals no abnormalities, atrophy or asymmetry. Palpation is normal with no tenderness over AC joint or bicipital groove. ROM is full in all planes. Rotator cuff strength normal throughout. No signs of impingement with negative Neer and Hawkin's tests, empty can. Speeds and Yergason's tests normal. No labral pathology noted with negative Obrien's, negative clunk and good stability. Normal scapular function observed. No painful arc and no drop arm sign. No apprehension sign  OMT Findings: Cervical: See to flexed rotated and side bent right, C6 through 7 rotated and side bent left Thoracic: T5 extended rotated and side bent right T8 extended rotated and side bent left Lumbar: L2 flexed rotated and side bent right Sacrum: Right posterior sacrum Right anterior ilium      Assessment & Plan:

## 2011-04-28 NOTE — Patient Instructions (Signed)
History to see you. Due to stretches for your shoulders and chest pressure do on the wall. Daily back exercises with soup cans. Stretch your psoas muscle every night before bed. Focus on strengthening her core. I should probably see you again in 3-4 weeks.   Back Injury Prevention How you use your back is important in preventing back injury. HEALTHY BACK IDEAS  Keep your back aligned and maintain good posture.   Sleep on a firm mattress. Two positions of sleep keep your back aligned properly:   Sleep on your side with your knees slightly bent.   Sleep on your back with a pillow under your knees.   Avoid sitting in 1 position for a long time. Get up and move around every hour.   Use massage if you feel tense in your shoulders, neck, or back.   Stretch before you work or exercise.  LIFTING  Avoid lifting heavy objects. Make several small trips rather than carrying 1 heavy load.   Do not let your back serve as a lever to lift objects.   Lift objects close to your body to prevent leaning forward.   Bend at your knees when you pick things up. Do not bend at your waist.   Try not to lift heavy things above your waist.   Try not to reach for things above your head in a manner that might disturb your back alignment.   Do not turn or twist while holding an object. Turn your feet, not your back.   Avoid sudden movements.   Get help to move an object when needed.   Use carts or dollies to move heavier objects whenever possible.  WORKING AT A DESK  Use a chair with good lower back support.   Sit close to your desk so you do not need to lean over.   Keep your chin tucked in to keep your back aligned.  IN A CAR  Avoid prolonged drives without enough breaks.   Sit comfortably with your arms slightly flexed.  EXERCISE Strengthening exercises decrease the risk of back injuries. Be sure to stretch before and after exercise. Ask your doctor for a list of exercises to  strengthen your back and belly (abdomen). Stay at a healthy body weight. MAKE SURE YOU:   Understand these instructions.   Will watch your condition.   Will get help right away if you are not doing well or get worse.  Document Released: 08/12/2007 Document Revised: 11/05/2010 Document Reviewed: 04/04/2009 [facility] Patient Information 2012 [place], [place].

## 2011-04-28 NOTE — Assessment & Plan Note (Signed)
After verbal consent pt did have HVLA, with marked improvement.  Gave side effects to look out for and can take anti inflammatories in the acute time frame. Exercises given to improve shoulder girdle and posture.

## 2011-04-28 NOTE — Assessment & Plan Note (Signed)
After verbal consent pt did have HVLA, with marked improvement.  Gave side effects to look out for and can take anti inflammatories in the acute time frame. Exercises given. Told her new mattress would be helpful.

## 2011-04-28 NOTE — Assessment & Plan Note (Signed)
After verbal consent pt did have HVLA, with marked improvement.  Gave side effects to look out for and can take anti inflammatories in the acute time frame. Talked about strength and core.

## 2011-08-14 ENCOUNTER — Encounter: Payer: Self-pay | Admitting: Family Medicine

## 2011-08-14 ENCOUNTER — Ambulatory Visit (INDEPENDENT_AMBULATORY_CARE_PROVIDER_SITE_OTHER): Payer: 59 | Admitting: Family Medicine

## 2011-08-14 VITALS — BP 127/83 | HR 89 | Temp 97.8°F | Ht 63.25 in | Wt 130.0 lb

## 2011-08-14 DIAGNOSIS — M549 Dorsalgia, unspecified: Secondary | ICD-10-CM

## 2011-08-14 DIAGNOSIS — M999 Biomechanical lesion, unspecified: Secondary | ICD-10-CM

## 2011-08-14 MED ORDER — PREDNISONE 50 MG PO TABS: ORAL_TABLET | ORAL | Status: AC

## 2011-08-14 MED ORDER — KETOROLAC TROMETHAMINE 60 MG/2ML IM SOLN
60.0000 mg | Freq: Once | INTRAMUSCULAR | Status: AC
  Administered 2011-08-14: 60 mg via INTRAMUSCULAR

## 2011-08-14 MED ORDER — DEXAMETHASONE SODIUM PHOSPHATE 10 MG/ML IJ SOLN
10.0000 mg | Freq: Once | INTRAMUSCULAR | Status: AC
  Administered 2011-08-14: 10 mg via INTRAMUSCULAR

## 2011-08-14 NOTE — Assessment & Plan Note (Signed)
Decision today to treat with OMT was based on Physical Exam  After verbal consent patient was treated with HVLA and muscle energy techniques in thoracic lumbar and sacral areas  Patient tolerated the procedure well with improvement in symptoms  Patient given exercises, stretches and lifestyle modifications  See medications in patient instructions if given  Patient will follow up in 4-6 weeks

## 2011-08-14 NOTE — Patient Instructions (Signed)
Hip Exercises RANGE OF MOTION (ROM) AND STRETCHING EXERCISES  These exercises may help you when beginning to rehabilitate your injury. Doing them too aggressively can worsen your condition. Complete them slowly and gently. Your symptoms may resolve with or without further involvement from your physician, physical therapist or athletic trainer. While completing these exercises, remember:   Restoring tissue flexibility helps normal motion to return to the joints. This allows healthier, less painful movement and activity.   An effective stretch should be held for at least 30 seconds.   A stretch should never be painful. You should only feel a gentle lengthening or release in the stretched tissue. If these stretches worsen your symptoms even when done gently, consult your physician, physical therapist or athletic trainer.  STRETCH - Hamstrings, Supine   Lie on your back. Loop a belt or towel over the ball of your right / left foot.   Straighten your right / left knee and slowly pull on the belt to raise your leg. Do not allow the right / left knee to bend. Keep your opposite leg flat on the floor.   Raise the leg until you feel a gentle stretch behind your right / left knee or thigh. Hold this position for __________ seconds.  Repeat __________ times. Complete this stretch __________ times per day.  STRETCH - Hip Rotators   Lie on your back on a firm surface. Grasp your right / left knee with your right / left hand and your ankle with your opposite hand.   Keeping your hips and shoulders firmly planted, gently pull your right / left knee and rotate your lower leg toward your opposite shoulder until you feel a stretch in your buttocks.   Hold this stretch for __________ seconds.  Repeat this stretch __________ times. Complete this stretch __________ times per day. STRETCH - Hamstrings/Adductors, V-Sit   Sit on the floor with your legs extended in a large "V," keeping your knees straight.    With your head and chest upright, bend at your waist reaching for your right foot to stretch your left adductors.   You should feel a stretch in your left inner thigh. Hold for __________ seconds.   Return to the upright position to relax your leg muscles.   Continuing to keep your chest upright, bend straight forward at your waist to stretch your hamstrings.   You should feel a stretch behind both of your thighs and/or knees. Hold for __________ seconds.   Return to the upright position to relax your leg muscles.   Repeat steps 2 through 4 for opposite leg.  Repeat __________ times. Complete this exercise __________ times per day.  STRETCHING - Hip Flexors, Lunge  Half kneel with your right / left knee on the floor and your opposite knee bent and directly over your ankle.   Keep good posture with your head over your shoulders. Tighten your buttocks to point your tailbone downward; this will prevent your back from arching too much.   You should feel a gentle stretch in the front of your thigh and/or hip. If you do not feel any resistance, slightly slide your opposite foot forward and then slowly lunge forward so your knee once again lines up over your ankle. Be sure your tailbone remains pointed downward.   Hold this stretch for __________ seconds.  Repeat __________ times. Complete this stretch __________ times per day. STRENGTHENING EXERCISES These exercises may help you when beginning to rehabilitate your injury. They may resolve your symptoms   with or without further involvement from your physician, physical therapist or athletic trainer. While completing these exercises, remember:   Muscles can gain both the endurance and the strength needed for everyday activities through controlled exercises.   Complete these exercises as instructed by your physician, physical therapist or athletic trainer. Progress the resistance and repetitions only as guided.   You may experience muscle  soreness or fatigue, but the pain or discomfort you are trying to eliminate should never worsen during these exercises. If this pain does worsen, stop and make certain you are following the directions exactly. If the pain is still present after adjustments, discontinue the exercise until you can discuss the trouble with your clinician.  STRENGTH - Hip Extensors, Bridge   Lie on your back on a firm surface. Bend your knees and place your feet flat on the floor.   Tighten your buttocks muscles and lift your bottom off the floor until your trunk is level with your thighs. You should feel the muscles in your buttocks and back of your thighs working. If you do not feel these muscles, slide your feet 1-2 inches further away from your buttocks.   Hold this position for __________ seconds.   Slowly lower your hips to the starting position and allow your buttock muscles relax completely before beginning the next repetition.   If this exercise is too easy, you may cross your arms over your chest.  Repeat __________ times. Complete this exercise __________ times per day.  STRENGTH - Hip Abductors, Straight Leg Raises  Be aware of your form throughout the entire exercise so that you exercise the correct muscles. Sloppy form means that you are not strengthening the correct muscles.  Lie on your side so that your head, shoulders, knee and hip line up. You may bend your lower knee to help maintain your balance. Your right / left leg should be on top.   Roll your hips slightly forward, so that your hips are stacked directly over each other and your right / left knee is facing forward.   Lift your top leg up 4-6 inches, leading with your heel. Be sure that your foot does not drift forward or that your knee does not roll toward the ceiling.   Hold this position for __________ seconds. You should feel the muscles in your outer hip lifting (you may not notice this until your leg begins to tire).   Slowly lower  your leg to the starting position. Allow the muscles to fully relax before beginning the next repetition.  Repeat __________ times. Complete this exercise __________ times per day.  STRENGTH - Hip Adductors, Straight Leg Raises   Lie on your side so that your head, shoulders, knee and hip line up. You may place your upper foot in front to help maintain your balance. Your right / left leg should be on the bottom.   Roll your hips slightly forward, so that your hips are stacked directly over each other and your right / left knee is facing forward.   Tense the muscles in your inner thigh and lift your bottom leg 4-6 inches. Hold this position for __________ seconds.   Slowly lower your leg to the starting position. Allow the muscles to fully relax before beginning the next repetition.  Repeat __________ times. Complete this exercise __________ times per day.  STRENGTH - Quadriceps, Straight Leg Raises  Quality counts! Watch for signs that the quadriceps muscle is working to insure you are strengthening the correct   muscles and not "cheating" by substituting with healthier muscles.  Lay on your back with your right / left leg extended and your opposite knee bent.   Tense the muscles in the front of your right / left thigh. You should see either your knee cap slide up or increased dimpling just above the knee. Your thigh may even quiver.   Tighten these muscles even more and raise your leg 4 to 6 inches off the floor. Hold for right / left seconds.   Keeping these muscles tense, lower your leg.   Relax the muscles slowly and completely in between each repetition.  Repeat __________ times. Complete this exercise __________ times per day.  STRENGTH - Hip Abductors, Standing  Tie one end of a rubber exercise band/tubing to a secure surface (table, pole) and tie a loop at the other end.   Place the loop around your right / left ankle. Keeping your ankle with the band directly opposite of the  secured end, step away until there is tension in the tube/band.   Hold onto a chair as needed for balance.   Keeping your back upright, your shoulders over your hips, and your toes pointing forward, lift your right / left leg out to your side. Be sure to lift your leg with your hip muscles. Do not "throw" your leg or tip your body to lift your leg.   Slowly and with control, return to the starting position.  Repeat exercise __________ times. Complete this exercise __________ times per day.  STRENGTH - Quadriceps, Squats  Stand in a door frame so that your feet and knees are in line with the frame.   Use your hands for balance, not support, on the frame.   Slowly lower your weight, bending at the hips and knees. Keep your lower legs upright so that they are parallel with the door frame. Squat only within the range that does not increase your knee pain. Never let your hips drop below your knees.   Slowly return upright, pushing with your legs, not pulling with your hands.  Document Released: 03/13/2005 Document Revised: 02/12/2011 Document Reviewed: 06/07/2008 [employer] Patient Information 2012 [employer]. 

## 2011-08-14 NOTE — Assessment & Plan Note (Signed)
Secondary to patient's hip pain and pelvic shear. Patient given Toradol and prednisone to help with acute flare secondary to manipulation today. Followup as needed.

## 2011-08-14 NOTE — Progress Notes (Signed)
Patient ID: [name], female   DOB: December 30, 1975, 36 y.o.   MRN: [id number]  36 year old female coming in with hip pain. Patient has had sciatica pain in the past but this one is a little bit different. Patient states that she has pain seems to radiate from the groin to her back mostly near the sacral bone on the right side. Patient denies any radiation of pain or any weakness in the hip. Patient states though that the pain seems to be getting worse and has a history of a biliary sponge kidney so is concerned of taking anti-inflammatories on a regular basis. Patient states she has not had a cough work yet but it is getting close to that type of pain. Patient denies any fever, chills, weight loss, abdominal pain.  Review of systems as above in history of present illness  Physical exam Vitals reviewed General: No apparent distress Cardiovascular: Regular in rhythm no murmur Pulmonary: Clear to auscultation bilaterally YQM:VHQIO ROM IR: 80 Deg, ER: 80 Deg, Flexion: 120 Deg, Extension: 100 Deg, Abduction: 45 Deg, Adduction: 45 Deg Strength IR: 5/5, ER: 5/5, Flexion: 5/5, Extension: 5/5, Abduction: 4/5, Adduction: 5/5 Pelvic alignment unremarkable to inspection and palpation. Standing hip rotation and gait without trendelenburg / unsteadiness. Greater trochanter without tenderness to palpation.  OMT Physical Exam  Standing structural       Occiput  Shoulder right shoulder lower.   Inferior angle of scapula lower right  Illiac crest high right  Standing flexion right  Seated Flexion right  Cervical  C3 flexed rotated and side bent right  Thoracic T3 extended rotated and side bent right T9 extended rotated and side bent left  Lumbar L3 flexed rotated and side bent left  Sacrum left on left  Has mild pelvic sheer her right

## 2011-08-14 NOTE — Progress Notes (Signed)
Addended by: Jone Baseman D on: 08/14/2011 12:37 PM   Modules accepted: Orders

## 2011-09-16 ENCOUNTER — Ambulatory Visit (INDEPENDENT_AMBULATORY_CARE_PROVIDER_SITE_OTHER): Payer: 59 | Admitting: Family Medicine

## 2011-09-16 VITALS — BP 118/90 | HR 99 | Temp 98.4°F | Resp 16 | Ht 63.5 in | Wt 131.6 lb

## 2011-09-16 DIAGNOSIS — M25559 Pain in unspecified hip: Secondary | ICD-10-CM

## 2011-09-16 DIAGNOSIS — G8929 Other chronic pain: Secondary | ICD-10-CM

## 2011-09-16 DIAGNOSIS — M533 Sacrococcygeal disorders, not elsewhere classified: Secondary | ICD-10-CM

## 2011-09-16 MED ORDER — TRAMADOL HCL 50 MG PO TABS: 50.0000 mg | ORAL_TABLET | Freq: Three times a day (TID) | ORAL | Status: AC | PRN

## 2011-09-16 MED ORDER — METHYLPREDNISOLONE ACETATE 80 MG/ML IJ SUSP
80.0000 mg | Freq: Once | INTRAMUSCULAR | Status: AC
  Administered 2011-09-16: 80 mg via INTRAMUSCULAR

## 2011-09-16 NOTE — Progress Notes (Signed)
Nature conservation officer at [facility] [address] [place] [address] Phone: [phone] Fax: [phone]  Date:  09/16/2011   Name:  [name]   DOB:  10-Jun-1975   MRN:  [id number]  PCP:  Rodman Pickle, MD    Chief Complaint: Back Pain and Hip Pain   History of Present Illness:  [name] is a 36 y.o. very pleasant female patient who presents with the following:  She has had some problems with right hip pain and sciatica- she had been followed by Dr. Antoine Primas, who was a DO at the family practice center while he was a resident.  He is now a sports fellow with Dr. Darrick Penna. He treated her with exercises, osteopathic manipulation, toradol and decadron last month.  This seemed to help her temporarily.  However, she lifted her daughter about 2 weeks ago and had recurrence of her pain.  She has pain and tenderness in her right buttock/ lower back area.  Her lower back is also tender bilaterally.  She has had this problem for about 6 weeks now.  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    She also has medullary sponge kidney but is otherwise generally healthy.  She does take flexeril at bedtime- she still has some pain, but is able to stay asleep once she falls asleep.  She liked Dr. Katrinka Blazing and would like to continue to see him, but was not aware that she could still do so.    Patient Active Problem List  Diagnosis  . ESSENTIAL HYPERTENSION, BENIGN  . NONALLOPATHIC LESION OF CERVICAL REGION NEC  . NONALLOPATHIC LESION OF THORACIC REGION NEC  . NONALLOPATHIC LESION OF SACRAL REGION NEC  . MEDULLARY SPONGE KIDNEY  . Bacterial sinusitis  . Allergic rhinitis  . Back pain, acute   Past Medical History  Diagnosis Date  . Allergy   . Anxiety    No past surgical history on file. History  Substance Use Topics  . Smoking status: Never Smoker   . Smokeless tobacco: Not on file  .  Alcohol Use: Yes   No family history on file. Allergies  Allergen Reactions  . Lamisil (Terbinafine Hcl) Hives and Itching  . Terbinafine Hcl Hives    Medication list has been reviewed and updated.  Current Outpatient Prescriptions on File Prior to Visit  Medication Sig Dispense Refill  . cyclobenzaprine (FLEXERIL) 10 MG tablet Take 1 tablet (10 mg total) by mouth every 8 (eight) hours as needed for muscle spasms.  30 tablet  1  . fexofenadine (ALLEGRA) 60 MG tablet Take 1 tablet (60 mg total) by mouth 2 (two) times daily.  60 tablet  2  . fluticasone (VERAMYST) 27.5 MCG/SPRAY nasal spray Place 2 sprays into the nose daily.  10 g  2    Review of Systems:  As per HPI- otherwise negative.   Physical Examination: Filed Vitals:   09/16/11 1527  BP: 118/90  Pulse: 99  Temp: 98.4 F (36.9 C)  Resp: 16   Filed Vitals:   09/16/11 1527  Height: 5' 3.5" (1.613 m)  Weight: 131 lb 9.6 oz (59.693 kg)   Body mass index is 22.95 kg/(m^2). Ideal Body Weight: Weight in (lb) to have BMI = 25: 143.1   GEN: WDWN, NAD, Non-toxic, A & O x 3, slim build HEENT: Atraumatic, Normocephalic. Neck supple. No masses, No LAD. Ears and  Nose: No external deformity. CV: RRR, No M/G/R. No JVD. No thrill. No extra heart sounds. PULM: CTA B, no wheezes, crackles, rhonchi. No retractions. No resp. distress. No accessory muscle use. EXTR: No c/c/e NEURO Normal gait.  PSYCH: Normally interactive. Conversant. Not depressed or anxious appearing.  Calm demeanor.  Tenderness over right SI joint.  Mild tenderness over bilateral lower back.  Normal flexion and extension.  Normal leg strength, sensation, patellar DTR, mildly positive SLR on the right.    Assessment and Plan: 1. Hip pain  traMADol (ULTRAM) 50 MG tablet  2. Chronic SI joint pain  methylPREDNISolone acetate (DEPO-MEDROL) injection 80 mg   SI joint pain and LBP.  Treated as above with depo- medrol and tramadol to use prn.  Let [name] know that she  can continue to see Dr. Katrinka Blazing- she is very glad about this and plans to see him at the [facility].  She will let me know if there is anything that I can do in the meantime.    Abbe Amsterdam, MD

## 2011-10-01 ENCOUNTER — Other Ambulatory Visit (HOSPITAL_COMMUNITY): Payer: Self-pay | Admitting: Sports Medicine

## 2011-10-01 DIAGNOSIS — M545 Low back pain, unspecified: Secondary | ICD-10-CM

## 2011-10-03 ENCOUNTER — Ambulatory Visit (HOSPITAL_COMMUNITY)
Admission: RE | Admit: 2011-10-03 | Discharge: 2011-10-03 | Disposition: A | Discharge status: Home / Self Care | Payer: 59 | Source: Ambulatory Visit | Attending: Sports Medicine | Admitting: Sports Medicine

## 2011-10-03 DIAGNOSIS — M545 Low back pain, unspecified: Secondary | ICD-10-CM

## 2011-10-03 DIAGNOSIS — M5126 Other intervertebral disc displacement, lumbar region: Secondary | ICD-10-CM | POA: Insufficient documentation

## 2011-11-02 ENCOUNTER — Ambulatory Visit: Payer: 59 | Attending: Family Medicine

## 2011-11-02 DIAGNOSIS — M545 Low back pain, unspecified: Secondary | ICD-10-CM | POA: Insufficient documentation

## 2011-11-02 DIAGNOSIS — IMO0001 Reserved for inherently not codable concepts without codable children: Secondary | ICD-10-CM | POA: Insufficient documentation

## 2011-11-02 DIAGNOSIS — M25559 Pain in unspecified hip: Secondary | ICD-10-CM | POA: Insufficient documentation

## 2011-11-11 ENCOUNTER — Ambulatory Visit: Payer: 59 | Attending: Family Medicine

## 2011-11-11 DIAGNOSIS — IMO0001 Reserved for inherently not codable concepts without codable children: Secondary | ICD-10-CM | POA: Insufficient documentation

## 2011-11-11 DIAGNOSIS — M545 Low back pain, unspecified: Secondary | ICD-10-CM | POA: Insufficient documentation

## 2011-11-11 DIAGNOSIS — M2569 Stiffness of other specified joint, not elsewhere classified: Secondary | ICD-10-CM | POA: Insufficient documentation

## 2011-11-11 DIAGNOSIS — M25559 Pain in unspecified hip: Secondary | ICD-10-CM | POA: Insufficient documentation

## 2011-11-12 ENCOUNTER — Ambulatory Visit: Payer: 59

## 2011-11-16 ENCOUNTER — Ambulatory Visit: Payer: 59

## 2011-11-19 ENCOUNTER — Ambulatory Visit: Payer: 59

## 2011-11-24 ENCOUNTER — Encounter: Payer: Self-pay | Admitting: Family Medicine

## 2011-11-24 ENCOUNTER — Ambulatory Visit (INDEPENDENT_AMBULATORY_CARE_PROVIDER_SITE_OTHER): Payer: 59 | Admitting: Family Medicine

## 2011-11-24 VITALS — BP 130/85 | HR 105 | Temp 98.2°F | Ht 63.5 in | Wt 133.0 lb

## 2011-11-24 DIAGNOSIS — R590 Localized enlarged lymph nodes: Secondary | ICD-10-CM | POA: Insufficient documentation

## 2011-11-24 DIAGNOSIS — R599 Enlarged lymph nodes, unspecified: Secondary | ICD-10-CM

## 2011-11-24 MED ORDER — AMOXICILLIN 500 MG PO CAPS: 500.0000 mg | ORAL_CAPSULE | Freq: Three times a day (TID) | ORAL | Status: DC

## 2011-11-24 NOTE — Assessment & Plan Note (Signed)
Unsure of etiology of LAD. Will refer to ENT for further work up and possible biopsy. Will also start on Amoxicillin TID x10 days to see if it is a reactive LAD that will resolve. If it goes away completely, she can cancel her ENT appointment. Given the size of the lymph node, and that it is not tender, I would lean more towards having a tissue biopsy, but I will leave that up to ENT. Patient to return in 1-2 months for follow up.

## 2011-11-24 NOTE — Patient Instructions (Signed)
It was good to see you today!  I am going to put in a referral to ENT for further evaluation of the lymph node. In the meantime, I am giving you an antibiotic to try to see if that will help it go away. If it goes away completely with the antibiotic, you can cancel your ENT appt.  Please come back to see me in 1-2 months, or after your ENT appt.  Take care! Porchia Sinkler M. Chaitra Mast, M.D.

## 2011-11-24 NOTE — Progress Notes (Signed)
Subjective:     Patient ID: [name], female   DOB: Aug 29, 1975, 36 y.o.   MRN: [id number]  HPI Pt felt ill one month ago and noticed a knot on left side of neck at that time. Fevers/chills/N/V one month ago. Got over the acute illness then noticed the knot did not go away. No fevers, no chills, no acute problems, no other swollen LAD. Never had anything like this before. No ear or tooth pain. Off/on allergy problems with post-nasal drainage but that has not changed the knot. Knot is nontender and has not changed. She has not noticed any significant weight loss. She does own cats but does not remember any bites or scratches.   History: Non-smoker  Review of Systems See HPI above    Objective:   Physical Exam Gen: Awake, alert. NAD HEENT: AT, [place]. Some post-nasal drip. Tonsils absent. Ears wnl, teeth wnl Neck: 1.5cm lymph node palpated in left cervical. Non-tender, no redness of skin. Moves some, but not fluctuant.  Heart: RRR Lungs: CTAB Abd: Soft nontender Ext: Moves all extremities. 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

## 2011-11-25 ENCOUNTER — Ambulatory Visit: Payer: 59

## 2011-11-26 ENCOUNTER — Ambulatory Visit: Payer: 59

## 2011-11-30 ENCOUNTER — Ambulatory Visit: Payer: 59

## 2011-12-04 ENCOUNTER — Ambulatory Visit: Payer: 59

## 2011-12-09 ENCOUNTER — Ambulatory Visit: Payer: 59 | Attending: Family Medicine

## 2011-12-09 DIAGNOSIS — IMO0001 Reserved for inherently not codable concepts without codable children: Secondary | ICD-10-CM | POA: Insufficient documentation

## 2011-12-09 DIAGNOSIS — M25559 Pain in unspecified hip: Secondary | ICD-10-CM | POA: Insufficient documentation

## 2011-12-09 DIAGNOSIS — M545 Low back pain, unspecified: Secondary | ICD-10-CM | POA: Insufficient documentation

## 2012-05-25 ENCOUNTER — Encounter: Payer: Self-pay | Admitting: Family Medicine

## 2012-05-25 ENCOUNTER — Ambulatory Visit (INDEPENDENT_AMBULATORY_CARE_PROVIDER_SITE_OTHER): Payer: 59 | Admitting: Family Medicine

## 2012-05-25 VITALS — BP 118/85 | HR 79 | Temp 97.6°F | Ht 63.5 in | Wt 135.0 lb

## 2012-05-25 DIAGNOSIS — A499 Bacterial infection, unspecified: Secondary | ICD-10-CM

## 2012-05-25 DIAGNOSIS — J329 Chronic sinusitis, unspecified: Secondary | ICD-10-CM

## 2012-05-25 DIAGNOSIS — B9689 Other specified bacterial agents as the cause of diseases classified elsewhere: Secondary | ICD-10-CM

## 2012-05-25 MED ORDER — AMOXICILLIN-POT CLAVULANATE 875-125 MG PO TABS: 1.0000 | ORAL_TABLET | Freq: Two times a day (BID) | ORAL | Status: DC

## 2012-05-25 NOTE — Patient Instructions (Signed)
I am sorry you are not feeling well- Please let us know if you are not feeling better after the medication.  Also, try nasal saline rinses or netti pot to help clear your nose out.

## 2012-05-25 NOTE — Progress Notes (Signed)
  Subjective:    Patient ID: [name], female    DOB: 1975/05/11, 37 y.o.   MRN: [id number]  HPI  [name] comes in with nasal congestion, headaches, facial pain for a few days.  She has had a cold for about two weeks and had gotten a little better and now is feeling much worse again.  She says her ears pop and she cannot clear the congestion from her nose.  Denies fever, wheezing, dyspnea.   Review of Systems See HPI    Objective:   Physical Exam BP 118/85  Pulse 79  Temp(Src) 97.6 F (36.4 C) (Oral)  Ht 5' 3.5" (1.613 m)  Wt 135 lb (61.236 kg)  BMI 23.54 kg/m2 General appearance: alert, cooperative and no distress Head: Normocephalic, without obvious abnormality, sinuses tender to percussion mildly Ears: normal TM's and external ear canals both ears Nose: moderate congestion Lungs: clear to auscultation bilaterally Heart: regular rate and rhythm, S1, S2 normal, no murmur, click, rub or gallop     Assessment & Plan:

## 2012-05-25 NOTE — Assessment & Plan Note (Signed)
Rx for Augmentin for 7 day course.

## 2012-06-03 ENCOUNTER — Other Ambulatory Visit (HOSPITAL_COMMUNITY): Payer: Self-pay | Admitting: Orthopedic Surgery

## 2012-06-03 DIAGNOSIS — M25551 Pain in right hip: Secondary | ICD-10-CM

## 2012-06-10 ENCOUNTER — Other Ambulatory Visit (HOSPITAL_COMMUNITY): Payer: Self-pay | Admitting: Orthopedic Surgery

## 2012-06-10 ENCOUNTER — Ambulatory Visit (HOSPITAL_COMMUNITY)
Admission: RE | Admit: 2012-06-10 | Discharge: 2012-06-10 | Disposition: A | Discharge status: Home / Self Care | Payer: 59 | Source: Ambulatory Visit | Attending: Orthopedic Surgery | Admitting: Orthopedic Surgery

## 2012-06-10 DIAGNOSIS — M25551 Pain in right hip: Secondary | ICD-10-CM

## 2012-06-10 DIAGNOSIS — M25559 Pain in unspecified hip: Secondary | ICD-10-CM | POA: Insufficient documentation

## 2012-06-10 DIAGNOSIS — M24859 Other specific joint derangements of unspecified hip, not elsewhere classified: Secondary | ICD-10-CM | POA: Insufficient documentation

## 2012-06-10 DIAGNOSIS — N83209 Unspecified ovarian cyst, unspecified side: Secondary | ICD-10-CM | POA: Insufficient documentation

## 2012-06-10 DIAGNOSIS — M2480 Other specific joint derangements of unspecified joint, not elsewhere classified: Secondary | ICD-10-CM | POA: Insufficient documentation

## 2012-06-10 MED ORDER — IOHEXOL 180 MG/ML  SOLN
20.0000 mL | Freq: Once | INTRAMUSCULAR | Status: AC | PRN
  Administered 2012-06-10: 8 mL via INTRA_ARTICULAR

## 2012-06-10 MED ORDER — GADOBENATE DIMEGLUMINE 529 MG/ML IV SOLN
5.0000 mL | Freq: Once | INTRAVENOUS | Status: AC | PRN
  Administered 2012-06-10: 0.1 mL via INTRAVENOUS

## 2013-02-17 ENCOUNTER — Ambulatory Visit (INDEPENDENT_AMBULATORY_CARE_PROVIDER_SITE_OTHER): Payer: 59 | Admitting: Family Medicine

## 2013-02-17 ENCOUNTER — Encounter: Payer: Self-pay | Admitting: Family Medicine

## 2013-02-17 VITALS — BP 132/89 | HR 82 | Temp 98.4°F | Ht 63.5 in | Wt 135.0 lb

## 2013-02-17 DIAGNOSIS — A499 Bacterial infection, unspecified: Secondary | ICD-10-CM

## 2013-02-17 DIAGNOSIS — J329 Chronic sinusitis, unspecified: Secondary | ICD-10-CM

## 2013-02-17 DIAGNOSIS — B9689 Other specified bacterial agents as the cause of diseases classified elsewhere: Secondary | ICD-10-CM

## 2013-02-17 MED ORDER — AMOXICILLIN-POT CLAVULANATE 875-125 MG PO TABS: 1.0000 | ORAL_TABLET | Freq: Two times a day (BID) | ORAL | Status: DC

## 2013-02-17 NOTE — Progress Notes (Signed)
  Tana Conch, MD Phone: [phone]  Subjective:     [name] is a 37 y.o. female who presents for evaluation of sinus pain. Symptoms include: congestion, cough, facial pain and foul breath. Onset of symptoms was 3 weeks ago. Symptoms have been gradually worsening since that time. Past history is significant for about twice yearly sinusitis episodes. Patient is a non-smoker. Taking allegra D for 3 weeks without relief. Also ibuprofen as needed for pain.   Past medical history-allergic rhinitis, hypertension (diet and exercise controlled), medullary sponge kidney  Review of Systems No fever, chills. Endorses pain with chewing. Drainage is R>L. Clear on left, purulent on right.   Objective:    BP 132/89  Pulse 82  Temp(Src) 98.4 F (36.9 C) (Oral)  Ht 5' 3.5" (1.613 m)  Wt 135 lb (61.236 kg)  BMI 23.54 kg/m2 General appearance: alert, cooperative and fatigued Head: pain with tapping on right frontal sinus Eyes: conjunctivae/corneas clear. PERRL, EOM's intact. Ears: normal TM's and external ear canals both ears Nose: erythematous nares, thick yellow-green discharge on right.  Throat: lips, mucosa, and tongue normal; teeth and gums normal Lungs: clear to auscultation bilaterally Heart: regular rate and rhythm, S1, S2 normal, no murmur, click, rub or gallop    Assessment:    Acute bacterial sinusitis.    Plan:    Neti pot recommended. Instructions given. Augmentin per medication orders.  Return prn if no improvement or worsening of symptoms  Meds ordered this encounter  Medications  . amoxicillin-clavulanate (AUGMENTIN) 875-125 MG per tablet    Sig: Take 1 tablet by mouth 2 (two) times daily.    Dispense:  14 tablet    Refill:  0

## 2013-02-17 NOTE — Patient Instructions (Signed)
Typically 5 days is adequate. I have given you 7 days worth just in case it is a little more persistent.  See [place] if no improvement or if recurrence after treatment.   Sinusitis Sinusitis is redness, soreness, and swelling (inflammation) of the paranasal sinuses. Paranasal sinuses are air pockets within the bones of your face (beneath the eyes, the middle of the forehead, or above the eyes). In healthy paranasal sinuses, mucus is able to drain out, and air is able to circulate through them by way of your nose. However, when your paranasal sinuses are inflamed, mucus and air can become trapped. This can allow bacteria and other germs to grow and cause infection. Sinusitis can develop quickly and last only a short time (acute) or continue over a long period (chronic). Sinusitis that lasts for more than 12 weeks is considered chronic.  CAUSES  Causes of sinusitis include:  Allergies.  Structural abnormalities, such as displacement of the cartilage that separates your nostrils (deviated septum), which can decrease the air flow through your nose and sinuses and affect sinus drainage.  Functional abnormalities, such as when the small hairs (cilia) that line your sinuses and help remove mucus do not work properly or are not present. SYMPTOMS  Symptoms of acute and chronic sinusitis are the same. The primary symptoms are pain and pressure around the affected sinuses. Other symptoms include:  Upper toothache.  Earache.  Headache.  Bad breath.  Decreased sense of smell and taste.  A cough, which worsens when you are lying flat.  Fatigue.  Fever.  Thick drainage from your nose, which often is green and may contain pus (purulent).  Swelling and warmth over the affected sinuses. DIAGNOSIS  Your caregiver will perform a physical exam. During the exam, your caregiver may:  Look in your nose for signs of abnormal growths in your nostrils (nasal polyps).  Tap over the affected sinus to check  for signs of infection.  View the inside of your sinuses (endoscopy) with a special imaging device with a light attached (endoscope), which is inserted into your sinuses. If your caregiver suspects that you have chronic sinusitis, one or more of the following tests may be recommended:  Allergy tests.  Nasal culture A sample of mucus is taken from your nose and sent to a lab and screened for bacteria.  Nasal cytology A sample of mucus is taken from your nose and examined by your caregiver to determine if your sinusitis is related to an allergy. TREATMENT  Most cases of acute sinusitis are related to a viral infection and will resolve on their own within 10 days. Sometimes medicines are prescribed to help relieve symptoms (pain medicine, decongestants, nasal steroid sprays, or saline sprays).  However, for sinusitis related to a bacterial infection, your caregiver will prescribe antibiotic medicines. These are medicines that will help kill the bacteria causing the infection.  Rarely, sinusitis is caused by a fungal infection. In theses cases, your caregiver will prescribe antifungal medicine. For some cases of chronic sinusitis, surgery is needed. Generally, these are cases in which sinusitis recurs more than 3 times per year, despite other treatments. HOME CARE INSTRUCTIONS   Drink plenty of water. Water helps thin the mucus so your sinuses can drain more easily.  Use a humidifier.  Inhale steam 3 to 4 times a day (for example, sit in the bathroom with the shower running).  Apply a warm, moist washcloth to your face 3 to 4 times a day, or as directed by  your caregiver.  Use saline nasal sprays to help moisten and clean your sinuses.  Take over-the-counter or prescription medicines for pain, discomfort, or fever only as directed by your caregiver. SEEK IMMEDIATE MEDICAL CARE IF:  You have increasing pain or severe headaches.  You have nausea, vomiting, or drowsiness.  You have  swelling around your face.  You have vision problems.  You have a stiff neck.  You have difficulty breathing. MAKE SURE YOU:   Understand these instructions.  Will watch your condition.  Will get help right away if you are not doing well or get worse. Document Released: 02/23/2005 Document Revised: 05/18/2011 Document Reviewed: 03/10/2011 [facility] Patient Information 2014 [place], [place].

## 2013-05-10 ENCOUNTER — Other Ambulatory Visit: Payer: Self-pay

## 2013-05-10 DIAGNOSIS — Z1231 Encounter for screening mammogram for malignant neoplasm of breast: Secondary | ICD-10-CM

## 2013-05-18 ENCOUNTER — Encounter: Payer: Self-pay | Admitting: Family Medicine

## 2013-05-18 ENCOUNTER — Ambulatory Visit (INDEPENDENT_AMBULATORY_CARE_PROVIDER_SITE_OTHER): Payer: 59 | Admitting: Family Medicine

## 2013-05-18 VITALS — BP 146/88 | HR 90 | Temp 98.7°F | Ht 63.5 in | Wt 134.0 lb

## 2013-05-18 DIAGNOSIS — B3749 Other urogenital candidiasis: Secondary | ICD-10-CM | POA: Insufficient documentation

## 2013-05-18 DIAGNOSIS — R300 Dysuria: Secondary | ICD-10-CM

## 2013-05-18 DIAGNOSIS — N390 Urinary tract infection, site not specified: Secondary | ICD-10-CM

## 2013-05-18 LAB — POCT URINALYSIS DIPSTICK
Bilirubin, UA: NEGATIVE
GLUCOSE UA: NEGATIVE
Ketones, UA: NEGATIVE
NITRITE UA: NEGATIVE
PH UA: 6
Protein, UA: NEGATIVE
RBC UA: NEGATIVE
Spec Grav, UA: 1.02
UROBILINOGEN UA: 0.2

## 2013-05-18 LAB — POCT UA - MICROSCOPIC ONLY

## 2013-05-18 MED ORDER — CEPHALEXIN 500 MG PO CAPS: 500.0000 mg | ORAL_CAPSULE | Freq: Four times a day (QID) | ORAL | Status: DC

## 2013-05-18 NOTE — Progress Notes (Signed)
   Subjective:    Patient ID: [name], female    DOB: 1976-01-23, 38 y.o.   MRN: [id number]  HPI Dysuria: Patient report dysuria for 2-3 days. She endorses burning with urination, frequency of urination and incomplete bladder emptying. She notices a foul smell of her urine. She denies any hematuria, fever, abdominal pain or back pain. She is tolerating by mouth. She has a history of urinary tract infections usually 2 times a year. She is taking an over-the-counter medication for the discomfort is similar to Pyridium.   Review of Systems  Negative, with the exception of above mentioned in HPI Objective:   Physical Exam BP 146/88  Pulse 90  Temp(Src) 98.7 F (37.1 C) (Oral)  Ht 5' 3.5" (1.613 m)  Wt 134 lb (60.782 kg)  BMI 23.36 kg/m2 Gen: NAD.  CV: RRR Chest: CTAB, no wheeze or crackles Abd: Soft. Mild suprapubic tenderness .ND. BS present. No Masses palpated.  Ext: No erythema. No edema.  Skin: No rashes, purpura or petechiae.

## 2013-05-18 NOTE — Patient Instructions (Signed)
Urinary Tract Infection  Urinary tract infections (UTIs) can develop anywhere along your urinary tract. Your urinary tract is your body's drainage system for removing wastes and extra water. Your urinary tract includes two kidneys, two ureters, a bladder, and a urethra. Your kidneys are a pair of bean-shaped organs. Each kidney is about the size of your fist. They are located below your ribs, one on each side of your spine.  CAUSES  Infections are caused by microbes, which are microscopic organisms, including fungi, viruses, and bacteria. These organisms are so small that they can only be seen through a microscope. Bacteria are the microbes that most commonly cause UTIs.  SYMPTOMS   Symptoms of UTIs may vary by age and gender of the patient and by the location of the infection. Symptoms in young women typically include a frequent and intense urge to urinate and a painful, burning feeling in the bladder or urethra during urination. Older women and men are more likely to be tired, shaky, and weak and have muscle aches and abdominal pain. A fever may mean the infection is in your kidneys. Other symptoms of a kidney infection include pain in your back or sides below the ribs, nausea, and vomiting.  DIAGNOSIS  To diagnose a UTI, your caregiver will ask you about your symptoms. Your caregiver also will ask to provide a urine sample. The urine sample will be tested for bacteria and white blood cells. White blood cells are made by your body to help fight infection.  TREATMENT   Typically, UTIs can be treated with medication. Because most UTIs are caused by a bacterial infection, they usually can be treated with the use of antibiotics. The choice of antibiotic and length of treatment depend on your symptoms and the type of bacteria causing your infection.  HOME CARE INSTRUCTIONS   If you were prescribed antibiotics, take them exactly as your caregiver instructs you. Finish the medication even if you feel better after you  have only taken some of the medication.   Drink enough water and fluids to keep your urine clear or pale yellow.   Avoid caffeine, tea, and carbonated beverages. They tend to irritate your bladder.   Empty your bladder often. Avoid holding urine for long periods of time.   Empty your bladder before and after sexual intercourse.   After a bowel movement, women should cleanse from front to back. Use each tissue only once.  SEEK MEDICAL CARE IF:    You have back pain.   You develop a fever.   Your symptoms do not begin to resolve within 3 days.  SEEK IMMEDIATE MEDICAL CARE IF:    You have severe back pain or lower abdominal pain.   You develop chills.   You have nausea or vomiting.   You have continued burning or discomfort with urination.  MAKE SURE YOU:    Understand these instructions.   Will watch your condition.   Will get help right away if you are not doing well or get worse.  Document Released: 12/03/2004 Document Revised: 08/25/2011 Document Reviewed: 04/03/2011  [employer] Patient Information 2014 [employer].

## 2013-05-18 NOTE — Assessment & Plan Note (Addendum)
Patient with large leukoesterase, negative nitrite and urine. We'll send for culture and call patient with results Will treat with Keflex patient to followup if symptoms are not resolved

## 2013-05-20 LAB — URINE CULTURE

## 2013-06-09 ENCOUNTER — Ambulatory Visit
Admission: RE | Admit: 2013-06-09 | Discharge: 2013-06-09 | Disposition: A | Discharge status: Home / Self Care | Payer: 59 | Source: Ambulatory Visit

## 2013-06-09 DIAGNOSIS — Z1231 Encounter for screening mammogram for malignant neoplasm of breast: Secondary | ICD-10-CM

## 2013-06-30 ENCOUNTER — Encounter: Payer: Self-pay | Admitting: Family Medicine

## 2013-06-30 ENCOUNTER — Ambulatory Visit (INDEPENDENT_AMBULATORY_CARE_PROVIDER_SITE_OTHER): Payer: 59 | Admitting: Family Medicine

## 2013-06-30 VITALS — BP 130/80 | HR 80 | Temp 98.5°F | Ht 63.0 in | Wt 132.6 lb

## 2013-06-30 DIAGNOSIS — N390 Urinary tract infection, site not specified: Secondary | ICD-10-CM

## 2013-06-30 LAB — POCT URINALYSIS DIPSTICK
Bilirubin, UA: NEGATIVE
Glucose, UA: NEGATIVE
Ketones, UA: NEGATIVE
Nitrite, UA: NEGATIVE
Protein, UA: NEGATIVE
SPEC GRAV UA: 1.02
Urobilinogen, UA: 0.2
pH, UA: 6

## 2013-06-30 LAB — POCT UA - MICROSCOPIC ONLY

## 2013-06-30 MED ORDER — CEPHALEXIN 250 MG PO CAPS: 250.0000 mg | ORAL_CAPSULE | Freq: Once | ORAL | Status: DC | PRN

## 2013-06-30 MED ORDER — CEPHALEXIN 500 MG PO CAPS: 500.0000 mg | ORAL_CAPSULE | Freq: Four times a day (QID) | ORAL | Status: DC

## 2013-06-30 NOTE — Patient Instructions (Signed)
UTI  Treat Current one with keflex for 7 days  For prevention- 250 mg dose after sex in the future  Thanks, Dr. Yong Channel  Health Maintenance Due  Topic Date Due  . Pap Smear  06/06/1991  . Tetanus/tdap  06/05/1992

## 2013-06-30 NOTE — Assessment & Plan Note (Signed)
Recurrent post coital UTI (based off symptoms)  with 2 episodes in 2 months. UA pending and also sent for culture. Will start back on prophylaxis with 243m keflex after sex (patient has sex infrequently so daily not as beneficial). Treat current UTI with keflex x 7 days.

## 2013-06-30 NOTE — Progress Notes (Signed)
  Garret Reddish, MD Phone: [phone]  Subjective:   [name] is a 38 y.o. year old very pleasant female patient who presents with the following:  Dysuria Urinary Burning, frequency, urgency x 3 days. Burning with each episode. Stable in course.  No polydipsia. Started after sex. Used to be on post coital prophylaxis. Typically has about 2-3 UTI per year but always after sex.  ROS- Denies nausea/vomiting/fever/chills/fatigue/overall sick feelings. No CVA tenderness. No vaginal discharge or itching.   Past Medical History-allergic rhinitis, hypertension (diet and exercise controlled), medullary sponge kidney, history of UTI in early march 2015 Medications- allegra, fluticasone nasal, ibuprofen   Objective: BP 130/80  Pulse 80  Temp(Src) 98.5 F (36.9 C) (Oral)  Ht 5' 3"  (1.6 m)  Wt 132 lb 9.6 oz (60.147 kg)  BMI 23.49 kg/m2 Gen: NAD, resting comfortably Back: No CVA tenderness Abdomen: softnondistended/normal bowel sounds. No rebound or guarding. Mild suprapubic tenderness.  Ext: no edema Skin: warm, dry, no rash  Assessment/Plan:  Urinary tract infection, site not specified Recurrent post coital UTI (based off symptoms)  with 2 episodes in 2 months. UA pending and also sent for culture. Will start back on prophylaxis with 254m keflex after sex (patient has sex infrequently so daily not as beneficial). Treat current UTI with keflex x 7 days.

## 2013-06-30 NOTE — Addendum Note (Signed)
Addended by: [place], Aven Cegielski on: 06/30/2013 09:33 AM   Modules accepted: Orders

## 2013-08-31 ENCOUNTER — Encounter: Payer: Self-pay | Admitting: Family Medicine

## 2013-08-31 ENCOUNTER — Ambulatory Visit (INDEPENDENT_AMBULATORY_CARE_PROVIDER_SITE_OTHER): Payer: 59 | Admitting: Family Medicine

## 2013-08-31 VITALS — BP 127/88 | HR 90 | Temp 98.0°F | Wt 131.0 lb

## 2013-08-31 DIAGNOSIS — I10 Essential (primary) hypertension: Secondary | ICD-10-CM

## 2013-08-31 MED ORDER — METOPROLOL SUCCINATE ER 25 MG PO TB24: 25.0000 mg | ORAL_TABLET | Freq: Every day | ORAL | Status: DC

## 2013-08-31 NOTE — Patient Instructions (Signed)
Start the blood pressure pill daily. Monitor your symptoms and BP. If you are not feeling well, please let us know. You have many other options if this does not work for you.  Amber M. Hairford, M.D.

## 2013-08-31 NOTE — Assessment & Plan Note (Signed)
A: Elevated blood pressure last week. Appears to be very labile. She has been on medications before.  P: - Given her elevated pulse and reports of palpitations will start with Metoprolol 62m daily - F/u in 1-2 months for recheck - Encouraged to keep track of her blood pressure at work and let [place] if she is not feeling well or if her BP is elevated.

## 2013-08-31 NOTE — Progress Notes (Signed)
Patient ID: [name], female   DOB: 1975/04/24, 38 y.o.   MRN: [id number]    Subjective: HPI: Patient is a 38 y.o. female presenting to clinic today for ED follow up for elevated blood pressure.  HTN- Was at the beach a few weeks ago. She did not feel well that week. On June 18, she was having palpitations, neck pain, chest pain and flushed. Went to the ED, BP was 170/100 at triage. Came down to 140's/90's. She had PAC on the monitor, HR 100's. EKG showed sinus tach. Given Metoprolol in the ED which helped. Had carotid doppler for possible bruit on the left, which was normal. She states her BP has been up for a few months. She states she develops sweatiness and rushing sound in her ears when it is up. She has a family history of HTN. She also had HTN during pregnancy.   History Reviewed: Never smoker. Health Maintenance: UTD  ROS: Please see HPI above.  Objective: Office vital signs reviewed. BP 127/88  Pulse 90  Temp(Src) 98 F (36.7 C) (Oral)  Wt 131 lb (59.421 kg)  Physical Examination:  General: Awake, alert. NAD HEENT: Atraumatic, normocephalic. MMM Pulm: CTAB, no wheezes Cardio: RRR, no murmurs appreciated Abdomen:+BS, soft, nontender, nondistended Extremities: No edema Neuro: Grossly intact  Assessment: 38 y.o. female follow up for HTN  Plan: See Problem List and After Visit Summary

## 2014-01-30 ENCOUNTER — Ambulatory Visit (INDEPENDENT_AMBULATORY_CARE_PROVIDER_SITE_OTHER): Payer: 59 | Admitting: Family Medicine

## 2014-01-30 ENCOUNTER — Encounter: Payer: Self-pay | Admitting: Family Medicine

## 2014-01-30 VITALS — BP 141/98 | HR 88 | Temp 97.8°F | Ht 63.0 in | Wt 138.0 lb

## 2014-01-30 DIAGNOSIS — R300 Dysuria: Secondary | ICD-10-CM

## 2014-01-30 DIAGNOSIS — I10 Essential (primary) hypertension: Secondary | ICD-10-CM

## 2014-01-30 DIAGNOSIS — N3000 Acute cystitis without hematuria: Secondary | ICD-10-CM

## 2014-01-30 LAB — POCT URINALYSIS DIPSTICK
BILIRUBIN UA: NEGATIVE
GLUCOSE UA: NEGATIVE
Ketones, UA: NEGATIVE
NITRITE UA: POSITIVE
Protein, UA: NEGATIVE
RBC UA: NEGATIVE
Spec Grav, UA: 1.015
Urobilinogen, UA: 1
pH, UA: 7

## 2014-01-30 LAB — POCT UA - MICROSCOPIC ONLY: WBC, Ur, HPF, POC: >20

## 2014-01-30 MED ORDER — CEPHALEXIN 250 MG PO CAPS: 250.0000 mg | ORAL_CAPSULE | Freq: Once | ORAL | Status: DC | PRN

## 2014-01-30 MED ORDER — CEPHALEXIN 500 MG PO CAPS: 500.0000 mg | ORAL_CAPSULE | Freq: Two times a day (BID) | ORAL | Status: DC

## 2014-01-30 NOTE — Progress Notes (Signed)
Subjective:     Patient ID: [name], female   DOB: 06/22/1975, 38 y.o.   MRN: [id number]  HPI [name] is a 38yo female presenting for UTI.  # UTI: - Has history of multiple UTIs in past, usually associated with sexual intercourse. States this feels similar to prior UTIs. - Has been experiencing burning, frequency, and odor, all of which she has experienced with previous UTIs - Symptoms started on Sunday (01/28/14) - Was treated with Keflex in past with Keflex for prophylaxis after intercourse. She has ran out of prophylactic medication. - Takes pyridium, which may discolor urine  # HTN-  141/98 today - Has not been taking BP medications - Prescribe Metoprolol succinate 36m - States she has been feeling more fatigued since taking 221m Would prefer to take 12.80m680mgain.  No further complaints today.  Review of Systems  Constitutional: Negative for fever.  Gastrointestinal: Negative for abdominal pain.  Genitourinary: Positive for dysuria and frequency. Negative for flank pain.       Objective:   Physical Exam  Constitutional: She is oriented to person, place, and time. She appears well-developed and well-nourished. No distress.  Cardiovascular: Normal rate and regular rhythm.  Exam reveals no gallop and no friction rub.   No murmur heard. Pulmonary/Chest: Effort normal and breath sounds normal. No respiratory distress. She has no wheezes. She has no rales.  Abdominal: Soft. Bowel sounds are normal. She exhibits no distension. There is no tenderness.  Notes "pressure/discomfort" over suprapubic region, but denies pain. No CVA tenderness noted.  Neurological: She is alert and oriented to person, place, and time.  Psychiatric: She has a normal mood and affect. Her behavior is normal.       Assessment:     Please refer to Problem List for Assessment.    Plan:     Please refer to Problem List for Plan.

## 2014-01-30 NOTE — Assessment & Plan Note (Addendum)
-   141/98 today - Has not been taking medications. States 78m makes her feel fatigued. Would prefer taking 12.532magain - Discussed importance of taking HTN medications and consequences of not doing so. While she is prescribed 2566mwe discussed it would be better for her to take 12.5mg60man nothing. - Agrees to begin taking medication again.

## 2014-01-30 NOTE — Patient Instructions (Signed)
Thank you so much for coming to visit [place] today1  I have sent in prescriptions for Keflex, both for the treatment of your current UTI and for prevention of UTIs in the future! We will culture your urine and if a change in your antibiotics is needed you will be contacted!  Thanks again! Dr. Gerlean Ren

## 2014-01-30 NOTE — Assessment & Plan Note (Signed)
-   Urinalysis obtained. Sent for Urine Culture with sensitivities. Will contact Mrs. Sample with change of antibiotics if needed. Urinalysis    Component Value Date/Time   BILIRUBINUR NEG 01/30/2014 1413   PROTEINUR NEG 01/30/2014 1413   UROBILINOGEN 1.0 01/30/2014 1413   NITRITE POSITIVE 01/30/2014 1413   LEUKOCYTESUR small (1+) 01/30/2014 1413  - Prescription sent for Keflex 522m BID x 7days - Prescription sent for Keflex 2566mto use after intercourse for prophylaxis. Instructed to call office if she requires refill of medication.

## 2014-01-31 NOTE — Progress Notes (Signed)
Reviewed and discussed with resident.

## 2014-02-02 LAB — URINE CULTURE: Colony Count: >=100000

## 2014-02-08 ENCOUNTER — Telehealth: Payer: 59 | Admitting: Physician Assistant

## 2014-02-08 DIAGNOSIS — B9689 Other specified bacterial agents as the cause of diseases classified elsewhere: Secondary | ICD-10-CM

## 2014-02-08 DIAGNOSIS — J019 Acute sinusitis, unspecified: Principal | ICD-10-CM

## 2014-02-09 MED ORDER — AMOXICILLIN-POT CLAVULANATE 875-125 MG PO TABS: 1.0000 | ORAL_TABLET | Freq: Two times a day (BID) | ORAL | Status: DC

## 2014-02-09 NOTE — Progress Notes (Signed)
We are sorry that you are not feeling well.  Here is how we plan to help!  Based on what you have shared with me it looks like you have sinusitis.  Sinusitis is inflammation and infection in the sinus cavities of the head.  Based on your presentation I believe you most likely have Acute Bacterial sinusitis.  This is an infection caused by bacteria and is treated with antibiotics.  I have prescribed Augmentin, an antibiotic in the penicillin family, one tablet twice daily with food, for 7 days.  You may use an oral decongestant such as Mucinex D or if you have glaucoma or high blood pressure use plain Mucinex.  Saline nasal sprays help an can sefely be used as often as needed for congestion.  If you develop worsening sinus pain, fever or notice severe headache and vision changes, or if symptoms are not better after completion of antibiotic, please schedule an appointment with a health care provider.  Sinus infections are not as easily transmitted as other respiratory infection, however we still recommend that you avoid close contact with loved ones, especially the very young and elderly.  Remember to wash your hands thoroughly throughout the day as this is the number one way to prevent the spread of infection!  Home Care:  Only take medications as instructed by your medical team.  Complete the entire course of an antibiotic.  Do not take these medications with alcohol.  A steam or ultrasonic humidifier can help congestion.  You can place a towel over your head and breathe in the steam from hot water coming from a faucet.  Avoid close contacts especially the very young and the elderly.  Cover your mouth when you cough or sneeze.  Always remember to wash your hands.  Get Help Right Away If:  You develop worsening fever or sinus pain.  You develop a severe head ache or visual changes.  Your symptoms persist after you have completed your treatment plan.  Make sure you  Understand these  instructions.  Will watch your condition.  Will get help right away if you are not doing well or get worse.  Your e-visit answers were reviewed by a board certified advanced clinical practitioner to complete your personal care plan.  Depending on the condition, your plan could have included both over the counter or prescription medications.  Please review your pharmacy choice.  If there is a problem, you may call our nursing hot line at [phone] and have the prescription routed to another pharmacy.  Your safety is important to [place].  If you have drug allergies check your prescription carefully.    You can use MyChart to ask questions about today's visit, request a non-urgent call back, or ask for a work or school excuse.  You will get an e-mail in the next two days asking about your experience.  I hope that your e-visit has been valuable and will speed your recovery. Thank you for using e-visits.

## 2014-05-14 ENCOUNTER — Other Ambulatory Visit: Payer: Self-pay | Admitting: Family Medicine

## 2014-05-14 NOTE — Telephone Encounter (Signed)
It looks like it was prescribed Nov 2015 for presumed UTI. She needs to be evaluated in clinic to determine if this is needed again.  Hilton Sinclair, MD

## 2014-05-14 NOTE — Telephone Encounter (Signed)
Will forward to MD but patient might need an appt to be evaluated. Jazmin Hartsell,CMA

## 2014-05-14 NOTE — Telephone Encounter (Signed)
Needs refill on keflex

## 2014-05-15 MED ORDER — CEPHALEXIN 250 MG PO CAPS
250.0000 mg | ORAL_CAPSULE | Freq: Once | ORAL | Status: DC | PRN
Start: 2014-05-15 — End: 2014-05-16

## 2014-05-15 NOTE — Telephone Encounter (Signed)
Prescription for prophylactic Keflex sent in as discussed at previous visit. Please make sure this is for prophylactic purposes and she does not have any symptoms of UTI. If symptoms are present, she will need to be seen.

## 2014-05-15 NOTE — Telephone Encounter (Signed)
Will forward to Dr. Gerlean Ren to see if she will refill this medication. Dezeray Puccio,CMA

## 2014-05-15 NOTE — Telephone Encounter (Signed)
Pt called back. The keflex has been presribed as a preventative for UTI  After sex, This was the plan that decided on by Dr Gerlean Ren Please advise

## 2014-05-15 NOTE — Telephone Encounter (Signed)
Spoke with pt to inform her of below and she stated that she had made an appointment for tomorrow. Katharina Caper, Ricky Gallery D

## 2014-05-15 NOTE — Telephone Encounter (Signed)
Sorry Dr Gerlean Ren, I missed that it was being given prophylactically.  Hilton Sinclair, MD

## 2014-05-15 NOTE — Telephone Encounter (Signed)
Will forward to PCP for review. Triston Lisanti, CMA. 

## 2014-05-15 NOTE — Telephone Encounter (Signed)
LM ok per ROI for patient to call back and schedule an appt.  Sedonia Kitner,CMA

## 2014-05-16 ENCOUNTER — Encounter: Payer: Self-pay | Admitting: Family Medicine

## 2014-05-16 ENCOUNTER — Ambulatory Visit (INDEPENDENT_AMBULATORY_CARE_PROVIDER_SITE_OTHER): Payer: 59 | Admitting: Family Medicine

## 2014-05-16 VITALS — BP 110/64 | Temp 98.0°F | Ht 63.0 in | Wt 137.0 lb

## 2014-05-16 DIAGNOSIS — R300 Dysuria: Secondary | ICD-10-CM

## 2014-05-16 DIAGNOSIS — N390 Urinary tract infection, site not specified: Secondary | ICD-10-CM

## 2014-05-16 LAB — POCT UA - MICROSCOPIC ONLY

## 2014-05-16 LAB — POCT URINALYSIS DIPSTICK
Bilirubin, UA: NEGATIVE
GLUCOSE UA: NEGATIVE
KETONES UA: NEGATIVE
NITRITE UA: NEGATIVE
PROTEIN UA: NEGATIVE
SPEC GRAV UA: 1.015
UROBILINOGEN UA: 0.2
pH, UA: 6

## 2014-05-16 MED ORDER — CEPHALEXIN 250 MG PO CAPS: 250.0000 mg | ORAL_CAPSULE | Freq: Once | ORAL | Status: DC | PRN

## 2014-05-16 MED ORDER — CEPHALEXIN 500 MG PO CAPS: 500.0000 mg | ORAL_CAPSULE | Freq: Two times a day (BID) | ORAL | Status: DC

## 2014-05-16 MED ORDER — CEPHALEXIN 250 MG PO CAPS: 250.0000 mg | ORAL_CAPSULE | Freq: Every day | ORAL | Status: DC | PRN

## 2014-05-16 NOTE — Progress Notes (Signed)
Patient ID: [name], female   DOB: 1975-05-21, 39 y.o.   MRN: [id number] Subjective:   CC: Urinary burning   HPI:   Patient presented to same-day clinic for burning with urination. She has a history of recurrent UTIs for which she was on low dose of Keflex prophylactically after sex. She was unable to receive refill last week due to confusion about prophylactic dosing. She has now had 1 day of dysuria and malodorous urine. She denies fevers, chills, vomiting, nausea, diarrhea, vaginal discharge, vaginal pain, dyspareunia, or back pain. She typically takes prophylactic Keflex dosing weekly for the past 3-4 years. She has had frequent UTIs in the past which this feels like. She drinks 6-7 cups of water daily. She tries urinating right after sex to avoid UTI.   She also has intermittent right lower quadrant burning pain if she drinks too much caffeine with suprapubic discomfort that lasts for a few moments and then resolves. It has never been severe or caused her to seek emergency evaluation. Years ago this was evaluated by PCP with colonoscopy, pelvic ultrasound, CT pelvis which revealed medullary sponge kidney but no other abnormalities. She saw nephrology who recommended no NSAIDs but otherwise stated this should not cause pain. She has this pain every 2-3 months. She is interested in urology referral for frequent UTIs and this pelvic/right lower quadrant pain.   Review of Systems - Per HPI.   PMH - allergic rhinitis, back pain, hypertension, medullary sponge kidney, UTI history    Objective:  Physical Exam BP 110/64 mmHg  Temp(Src) 98 F (36.7 C) (Oral)  Ht 5' 3"  (1.6 m)  Wt 137 lb (62.143 kg)  BMI 24.27 kg/m2 GEN: NAD Abdomen: Soft, nontender, nondistended, no CVA or suprapubic tenderness    Assessment:     [name] is a 39 y.o. female here for dysuria.    Plan:     # See problem list and after visit summary for problem-specific plans.   # Health Maintenance: Not  discussed  Follow-up: Follow up in 1 week if lack of improvement of dysuria.   Hilton Sinclair, MD [place]

## 2014-05-16 NOTE — Assessment & Plan Note (Addendum)
Frequent recurrent UTIs, historically has been taking prophylactic Keflex 250 mg after intercourse once per week. This has helped control symptoms. Current symptoms of UTI in context of no prophylactic keflex on hand. Normal vitals with no symptoms of systemic illness. -We'll Rx Keflex 500 mg twice a day 7 days to empirically treat current UTI, return in 1 week if not improving for further workup. -Also Rx'd prophylactic Keflex 250 mg to take daily when necessary after sexual intercourse, patient typically takes weekly, #30 with 2 refills -Continue voiding after sex and staying hydrated. Can also try cranberry juice. -Referral to urology placed for further workup of recurrent UTIs and suprapubic intermittent pain per pt request.

## 2014-05-16 NOTE — Patient Instructions (Addendum)
It was good to see you today. I have refilled Keflex to take after sex prophylactically to prevent urinary tract infection. I have also referred you to urology for any further recommendations on frequent UTIs.  Best,  Hilton Sinclair, MD

## 2014-05-18 ENCOUNTER — Telehealth: Payer: Self-pay | Admitting: Family Medicine

## 2014-05-18 NOTE — Telephone Encounter (Signed)
Called to let patient know that UA results were borderline (neg nitrite, trace leuks, small RBC, 5-10 WBC, trace bacteria); along with symptoms that she states are now improving, still was likely a UTI.   Lorraine Sinclair, MD

## 2014-06-13 ENCOUNTER — Other Ambulatory Visit: Payer: Self-pay

## 2014-06-13 DIAGNOSIS — Z1231 Encounter for screening mammogram for malignant neoplasm of breast: Secondary | ICD-10-CM

## 2014-06-29 ENCOUNTER — Ambulatory Visit
Admission: RE | Admit: 2014-06-29 | Discharge: 2014-06-29 | Disposition: A | Discharge status: Home / Self Care | Payer: 59 | Source: Ambulatory Visit

## 2014-06-29 ENCOUNTER — Ambulatory Visit (INDEPENDENT_AMBULATORY_CARE_PROVIDER_SITE_OTHER): Payer: 59 | Admitting: Family Medicine

## 2014-06-29 ENCOUNTER — Encounter: Payer: Self-pay | Admitting: Family Medicine

## 2014-06-29 VITALS — BP 126/84 | HR 93 | Ht 63.0 in | Wt 137.0 lb

## 2014-06-29 DIAGNOSIS — M67951 Unspecified disorder of synovium and tendon, right thigh: Secondary | ICD-10-CM

## 2014-06-29 DIAGNOSIS — Z1231 Encounter for screening mammogram for malignant neoplasm of breast: Secondary | ICD-10-CM

## 2014-06-29 DIAGNOSIS — M9905 Segmental and somatic dysfunction of pelvic region: Secondary | ICD-10-CM | POA: Diagnosis not present

## 2014-06-29 DIAGNOSIS — M7601 Gluteal tendinitis, right hip: Secondary | ICD-10-CM

## 2014-06-29 DIAGNOSIS — M9903 Segmental and somatic dysfunction of lumbar region: Secondary | ICD-10-CM

## 2014-06-29 DIAGNOSIS — M999 Biomechanical lesion, unspecified: Secondary | ICD-10-CM

## 2014-06-29 DIAGNOSIS — M9904 Segmental and somatic dysfunction of sacral region: Secondary | ICD-10-CM | POA: Diagnosis not present

## 2014-06-29 NOTE — Progress Notes (Signed)
[name] [place], [place] [address] Phone: [phone] Subjective:    I'm seeing this patient by the request  of:  Conni Slipper, MD   CC: Low back pain  [id number] [name] is a 39 y.o. female coming in with complaint of low back pain. Patient used to be seen by me greater than 3 years ago at another facility. Patient was responding somewhat for osteopathic manipulation for her low back pain. Patient did have an MRI back in 2013 that showed a very small L5-S1 central protrusion but no significant nerve root impingement. Patient states she is given an epidural steroid injection but we did not have any significant improvement. Patient then had a sacroiliac joint on the right side injected as well also without any significant improvement. Patient states that it is more of a dull throbbing aching sensation that can be worse with certain activities. Sometimes as radiation that seems to be going to her hip. In 2014 patient did have an MRI of the hip that was unremarkable for any intra-articular pathology. All the imaging were reviewed by me today. Patient continues to have more of a chronic pain. Patient has a dull aching sensation. Patient states that sometimes it can be sharp as well. Starts to affect some of her daily activities. Seems to be worse when she is working and removing significant amount of weight. Patient rates the severity of pain a 7 out of 10. Patient has tried over-the-counter medications that is only seem to be temporizing. Patient would like to avoid any significant medications on a regular basis.  Past Medical History  Diagnosis Date  . Allergy   . Anxiety    No past surgical history on file. History  Substance Use Topics  . Smoking status: Never Smoker   . Smokeless tobacco: Not on file  . Alcohol Use: Yes   No family history on file. Allergies  Allergen Reactions  . Lamisil [Terbinafine Hcl] Hives and  Itching  . Terbinafine Hcl Hives        Past medical history, social, surgical and family history all reviewed in electronic medical record.   Review of Systems: No headache, visual changes, nausea, vomiting, diarrhea, constipation, dizziness, abdominal pain, skin rash, fevers, chills, night sweats, weight loss, swollen lymph nodes, body aches, joint swelling, muscle aches, chest pain, shortness of breath, mood changes.   Objective Blood pressure 126/84, pulse 93, height 5' 3"  (1.6 m), weight 137 lb (62.143 kg), SpO2 94 %.  General: No apparent distress alert and oriented x3 mood and affect normal, dressed appropriately.  HEENT: Pupils equal, extraocular movements intact  Respiratory: Patient's speak in full sentences and does not appear short of breath  Cardiovascular: No lower extremity edema, non tender, no erythema  Skin: Warm dry intact with no signs of infection or rash on extremities or on axial skeleton.  Abdomen: Soft nontender  Neuro: Cranial nerves II through XII are intact, neurovascularly intact in all extremities with 2+ DTRs and 2+ pulses.  Lymph: No lymphadenopathy of posterior or anterior cervical chain or axillae bilaterally.  Gait normal with good balance and coordination.  MSK:  Non tender with full range of motion and good stability and symmetric strength and tone of shoulders, elbows, wrist, hip, knee and ankles bilaterally.  Back Exam:  Inspection: Unremarkable  Motion: Flexion 35 deg, Extension 25 deg, Side Bending to 45 deg bilaterally,  Rotation to 45 deg bilaterally  SLR laying: Negative  XSLR  laying: Negative  Palpable tenderness: Tender to palpation over the right sacroiliac joint but also over the piriformis and even inferior to this area. No pain over the ischial bursa. Minimal pain over the greater trochanteric area. FABER: Positive Sensory change: Gross sensation intact to all lumbar and sacral dermatomes.  Reflexes: 2+ at both patellar tendons, 2+ at  achilles tendons, Babinski's downgoing.  Strength at foot  Plantar-flexion: 5/5 Dorsi-flexion: 5/5 Eversion: 5/5 Inversion: 5/5  Leg strength  Quad: 5/5 Hamstring: 5/5 Hip flexor: 5/5 Hip abductors: 4/5 on left with full strength on right Gait unremarkable. Negative grinding on right hip Foot exam shows overpronation of the hindfoot bilaterally. Patient also has mild bunion and bunionette formation. Mild breakdown of the midfoot as well.  Osteopathic findings Cervical C2 flexed rotated and side bent right C6 flexed rotated and side bent left Thoracic T5 extended rotated and side bent right T8 extended rotated and side bent left Lumbar L2 flexed rotated and side bent right Sacrum Right on right Ileum Right anterior ilium  Procedure note 40981; 15 minutes spent for Therapeutic exercises as stated in above notes.  This included exercises focusing on stretching, strengthening, with significant focus on eccentric aspects.  Low back exercises that included:  Pelvic tilt/bracing instruction to focus on control of the pelvic girdle and lower abdominal muscles  Glute strengthening exercises, focusing on proper firing of the glutes without engaging the low back muscles Proper stretching techniques for maximum relief for the hamstrings, hip flexors, low back and some rotation where tolerated Proper technique shown and discussed handout in great detail with ATC.  All questions were discussed and answered.      Impression and Recommendations:     This case required medical decision making of moderate complexity.

## 2014-06-29 NOTE — Patient Instructions (Addendum)
It is so good to see you!!!! Ice 20 minutes 2 times daily. Usually after activity and before bed. Exercises 3 times a week.  We will get you orthotics Try medicine 3 times a day for 6 days Can try pennsaid topically.  Vitamin D 2000 IU daily Turmeric 519m twice daily See me again in 3 weeks.

## 2014-06-29 NOTE — Progress Notes (Signed)
Pre visit review using our clinic review tool, if applicable. No additional management support is needed unless otherwise documented below in the visit note. 

## 2014-06-29 NOTE — Assessment & Plan Note (Signed)
Decision today to treat with OMT was based on Physical Exam  After verbal consent patient was treated with HVLA, ME techniques in lumbar sacral and ilium areas  Patient tolerated the procedure well with improvement in symptoms  Patient given exercises, stretches and lifestyle modifications  See medications in patient instructions if given  Patient will follow up in 3 weeks

## 2014-06-29 NOTE — Assessment & Plan Note (Addendum)
Patient is likely having more of an obturator muscle injury. I think that there is weakness of the gluteal muscle region. Difficult to incorporate at this time. I do not think that there is any significant radicular symptoms at this time and I do not think that this is more of a neuropathy. I do not find any signs of an internal derangement of the hip. Patient did respond somewhat to osteopathic manipulation today. Patient even home exercises and will work with Product/process development scientist. Depending on patient results we may went to consider ultrasound the area and see if we can find any hypoechoic changes or increasing Doppler flow that could be contributing. If this does occur we can always try an injection. Patient has done formal physical therapy and a lot of the other conservative therapy previously. Patient did not respond to the epidural lumbar injection or the intra-articular sacroiliac injection was tried by other providers previously. Patient and will come back and see me again in 3 weeks for further evaluation and treatment.

## 2014-07-04 ENCOUNTER — Encounter: Payer: Self-pay | Admitting: Family Medicine

## 2014-07-09 ENCOUNTER — Ambulatory Visit (INDEPENDENT_AMBULATORY_CARE_PROVIDER_SITE_OTHER): Payer: 59 | Admitting: Family Medicine

## 2014-07-09 ENCOUNTER — Encounter: Payer: Self-pay | Admitting: Family Medicine

## 2014-07-09 DIAGNOSIS — M7601 Gluteal tendinitis, right hip: Secondary | ICD-10-CM | POA: Diagnosis not present

## 2014-07-09 DIAGNOSIS — M67951 Unspecified disorder of synovium and tendon, right thigh: Secondary | ICD-10-CM

## 2014-07-09 NOTE — Assessment & Plan Note (Signed)
She is having more of an arm tourniquet discomfort that I think was giving her the trouble. Patient was put in custom orthotics today to see if we can actually improved alignment. Patient will slowly increase wear over the course of the next 2 weeks. Patient will follow-up again in 2-4 weeks for further evaluation and treatment.

## 2014-07-09 NOTE — Patient Instructions (Addendum)
Acclimating to your Igli orthotics -   We recommend that you allow up to 2 weeks for you to fully acclimate to your new custom orthotics. Please use the following recommended plan to build into full day wear.   Day 1 - 2hours/day Every day afterwards add 1 hour of wear(3hrs/day, 4hrs/day, etc) until you are able to wear them for an entire day without issues.   If you notice any irritation or increasing discomfort with your new orthotics, please do not hesitate in contacting the office(leave a message for Jeral Fruit or Ria Comment) or sending Jody a message through [place] to arrange a time to review your fit.   Enjoy your new orthotics!!   [name]

## 2014-07-09 NOTE — Progress Notes (Signed)
Pre visit review using our clinic review tool, if applicable. No additional management support is needed unless otherwise documented below in the visit note. 

## 2014-07-09 NOTE — Progress Notes (Signed)
Patient was fitted for a : standard, cushioned, semi-rigid orthotic. The orthotic was heated and afterward the patient was in a seated position and the orthotic molded. The patient was positioned in subtalar neutral position and 10 degrees of ankle dorsiflexion in a non-weight bearing stance. After completion of molding, patient did have orthotic management which included instructions on acclimating to the orthotics, signs of ill fit as well as care for the orthotic.  I spent 49mnutes fitting the patient for orthotics as well as discussing proper fit, transferring between shoes and care of the inserts.   The blank was ground to a stable position for weight bearing. Size: 9 (Igli Active)  Base: Carbon fiber Additional Posting and Padding: The following postings were fitted onto the molded orthotics to help maintain a talar neutral position - Wedge posting for transverse arch: 200/35 on the right side     Silicone posting for longitudinal arch: Right 250/120 and 250/100     Left 250/100 x2  The patient ambulated these, and they were very comfortable and supportive.

## 2014-07-10 ENCOUNTER — Ambulatory Visit: Payer: 59 | Admitting: Family Medicine

## 2014-07-26 ENCOUNTER — Ambulatory Visit: Payer: 59 | Admitting: Family Medicine

## 2014-07-30 ENCOUNTER — Ambulatory Visit (INDEPENDENT_AMBULATORY_CARE_PROVIDER_SITE_OTHER): Payer: 59 | Admitting: Family Medicine

## 2014-07-30 ENCOUNTER — Encounter: Payer: Self-pay | Admitting: Family Medicine

## 2014-07-30 VITALS — BP 120/84 | HR 87 | Ht 63.0 in | Wt 136.0 lb

## 2014-07-30 DIAGNOSIS — M9903 Segmental and somatic dysfunction of lumbar region: Secondary | ICD-10-CM | POA: Diagnosis not present

## 2014-07-30 DIAGNOSIS — M9905 Segmental and somatic dysfunction of pelvic region: Secondary | ICD-10-CM

## 2014-07-30 DIAGNOSIS — M7601 Gluteal tendinitis, right hip: Secondary | ICD-10-CM | POA: Diagnosis not present

## 2014-07-30 DIAGNOSIS — M9904 Segmental and somatic dysfunction of sacral region: Secondary | ICD-10-CM | POA: Diagnosis not present

## 2014-07-30 DIAGNOSIS — M67951 Unspecified disorder of synovium and tendon, right thigh: Secondary | ICD-10-CM

## 2014-07-30 DIAGNOSIS — M999 Biomechanical lesion, unspecified: Secondary | ICD-10-CM

## 2014-07-30 MED ORDER — NITROGLYCERIN 0.2 MG/HR TD PT24: MEDICATED_PATCH | TRANSDERMAL | Status: DC

## 2014-07-30 NOTE — Assessment & Plan Note (Signed)
Patient continued to have some difficulty. We are starting a nitroglycerin patient warned the potential side effects. Discussed icing regimen and continuing the home exercises. Patient will then do more of the home exercises. Patient will continue with the orthotics. Patient come back and see me again in 3-4 weeks.

## 2014-07-30 NOTE — Assessment & Plan Note (Signed)
Decision today to treat with OMT was based on Physical Exam  After verbal consent patient was treated with HVLA, ME techniques in lumbar sacral and ilium areas  Patient tolerated the procedure well with improvement in symptoms  Patient given exercises, stretches and lifestyle modifications  See medications in patient instructions if given  Patient will follow up in 3-4 weeks

## 2014-07-30 NOTE — Progress Notes (Signed)
[name] [place], [place] [address] Phone: [phone] Subjective:     CC: Low back pain  [id number] [name] is a 39 y.o. female coming in with complaint of low back pain. Patient used to be seen by me greater than 3 years ago at another facility. Patient was responding somewhat for osteopathic manipulation for her low back pain. Patient did have an MRI back in 2013 that showed a very small L5-S1 central protrusion but no significant nerve root impingement. Patient states she is given an epidural steroid injection but we did not have any significant improvement. Patient then had a sacroiliac joint on the right side injected as well also without any significant improvement. Patient states that it is more of a dull throbbing aching sensation that can be worse with certain activities. Sometimes as radiation that seems to be going to her hip. In 2014 patient did have an MRI of the hip that was unremarkable for any intra-articular pathology.   Patient was last seen 3 weeks ago. Patient was diagnosed with more of a sacroiliac joint dysfunction. Patient is in orthotics to help out as well secondary to improper alignment. Patient also had what appeared to be more of a tendinopathy of the right gluteal region that was given her some discomfort. Patient was given home exercises, topical anti-inflammatories and we discussed over-the-counter medications. Patient did respond fairly well to osteopathic manipulation. Patient states she has made some mild improvement. Patient states that she's been able to do some more increasing activity. Patient states that the orthotics have been very helpful. Has noticed that she doesn't have as much fatigue at the end of the day. Notices when she switched shoes back home she has some mild discomfort of the sacroiliac joint.  Past Medical History  Diagnosis Date  . Allergy   . Anxiety    No past surgical history on  file. History  Substance Use Topics  . Smoking status: Never Smoker   . Smokeless tobacco: Not on file  . Alcohol Use: Yes   No family history on file. Allergies  Allergen Reactions  . Lamisil [Terbinafine Hcl] Hives and Itching  . Terbinafine Hcl Hives        Past medical history, social, surgical and family history all reviewed in electronic medical record.   Review of Systems: No headache, visual changes, nausea, vomiting, diarrhea, constipation, dizziness, abdominal pain, skin rash, fevers, chills, night sweats, weight loss, swollen lymph nodes, body aches, joint swelling, muscle aches, chest pain, shortness of breath, mood changes.   Objective There were no vitals taken for this visit.  General: No apparent distress alert and oriented x3 mood and affect normal, dressed appropriately.  HEENT: Pupils equal, extraocular movements intact  Respiratory: Patient's speak in full sentences and does not appear short of breath  Cardiovascular: No lower extremity edema, non tender, no erythema  Skin: Warm dry intact with no signs of infection or rash on extremities or on axial skeleton.  Abdomen: Soft nontender  Neuro: Cranial nerves II through XII are intact, neurovascularly intact in all extremities with 2+ DTRs and 2+ pulses.  Lymph: No lymphadenopathy of posterior or anterior cervical chain or axillae bilaterally.  Gait normal with good balance and coordination.  MSK:  Non tender with full range of motion and good stability and symmetric strength and tone of shoulders, elbows, wrist, hip, knee and ankles bilaterally.  Back Exam:  Inspection: Unremarkable  Motion: Flexion 35 deg, Extension  25 deg, Side Bending to 45 deg bilaterally,  Rotation to 45 deg bilaterally  SLR laying: Negative  XSLR laying: Negative  Palpable tenderness: Tender to palpation over the right sacroiliac joint but also over the piriformis and inferior to this.. No pain over the ischial bursa. Minimal pain over  the greater trochanteric area. No significant change from previous exam. FABER: Positive Sensory change: Gross sensation intact to all lumbar and sacral dermatomes.  Reflexes: 2+ at both patellar tendons, 2+ at achilles tendons, Babinski's downgoing.  Strength at foot  Plantar-flexion: 5/5 Dorsi-flexion: 5/5 Eversion: 5/5 Inversion: 5/5  Leg strength  Quad: 5/5 Hamstring: 5/5 Hip flexor: 5/5 Hip abductors: 4/5 on left with full strength on right Gait unremarkable. Negative grinding on right hip Foot exam shows overpronation of the hindfoot bilaterally. Patient also has mild bunion and bunionette formation. Mild breakdown of the midfoot as well.  Osteopathic findings Cervical C2 flexed rotated and side bent right C6 flexed rotated and side bent left Thoracic T5 extended rotated and side bent right T8 extended rotated and side bent left Lumbar L2 flexed rotated and side bent right Sacrum Right on right Ileum Right anterior ilium within flaring Same pattern as previously   Impression and Recommendations:     This case required medical decision making of moderate complexity.

## 2014-07-30 NOTE — Progress Notes (Signed)
Pre visit review using our clinic review tool, if applicable. No additional management support is needed unless otherwise documented below in the visit note. 

## 2014-07-30 NOTE — Patient Instructions (Addendum)
Good to see you Ice is your friend Continue what you are doing.  Add 2# ankle weights Nitroglycerin Protocol   Apply 1/4 nitroglycerin patch to affected area daily.  Change position of patch within the affected area every 24 hours.  You may experience a headache during the first 1-2 weeks of using the patch, these should subside.  If you experience headaches after beginning nitroglycerin patch treatment, you may take your preferred over the counter pain reliever.  Another side effect of the nitroglycerin patch is skin irritation or rash related to patch adhesive.  Please notify our office if you develop more severe headaches or rash, and stop the patch.  Tendon healing with nitroglycerin patch may require 12 to 24 weeks depending on the extent of injury.  Men should not use if taking Viagra, Cialis, or Levitra.   Do not use if you have migraines or rosacea.   See me again in 3-4 weeks

## 2014-08-21 ENCOUNTER — Ambulatory Visit (INDEPENDENT_AMBULATORY_CARE_PROVIDER_SITE_OTHER): Payer: 59 | Admitting: Family Medicine

## 2014-08-21 ENCOUNTER — Encounter: Payer: Self-pay | Admitting: Family Medicine

## 2014-08-21 VITALS — BP 118/82 | HR 85 | Ht 63.0 in | Wt 136.0 lb

## 2014-08-21 DIAGNOSIS — M7601 Gluteal tendinitis, right hip: Secondary | ICD-10-CM

## 2014-08-21 DIAGNOSIS — M549 Dorsalgia, unspecified: Secondary | ICD-10-CM

## 2014-08-21 DIAGNOSIS — M9905 Segmental and somatic dysfunction of pelvic region: Secondary | ICD-10-CM | POA: Diagnosis not present

## 2014-08-21 DIAGNOSIS — M9904 Segmental and somatic dysfunction of sacral region: Secondary | ICD-10-CM

## 2014-08-21 DIAGNOSIS — M9903 Segmental and somatic dysfunction of lumbar region: Secondary | ICD-10-CM

## 2014-08-21 DIAGNOSIS — M67951 Unspecified disorder of synovium and tendon, right thigh: Secondary | ICD-10-CM

## 2014-08-21 DIAGNOSIS — M999 Biomechanical lesion, unspecified: Secondary | ICD-10-CM

## 2014-08-21 MED ORDER — METHYLPREDNISOLONE ACETATE 80 MG/ML IJ SUSP
80.0000 mg | Freq: Once | INTRAMUSCULAR | Status: AC
  Administered 2014-08-21: 80 mg via INTRAMUSCULAR

## 2014-08-21 MED ORDER — KETOROLAC TROMETHAMINE 60 MG/2ML IM SOLN
60.0000 mg | Freq: Once | INTRAMUSCULAR | Status: AC
  Administered 2014-08-21: 60 mg via INTRAMUSCULAR

## 2014-08-21 NOTE — Patient Instructions (Signed)
Good to se y ou ICe at the end of a long day When driving tennis ball under buttocks and roll  Towel just proximal to knee with sitting Continue the vitamins and the exercises  Enjoy vacation See me when you get back.

## 2014-08-21 NOTE — Assessment & Plan Note (Signed)
Decision today to treat with OMT was based on Physical Exam  After verbal consent patient was treated with HVLA, ME techniques in lumbar sacral and ilium areas  Patient tolerated the procedure well with improvement in symptoms  Patient given exercises, stretches and lifestyle modifications  See medications in patient instructions if given  Patient will follow up in 3-4 weeks

## 2014-08-21 NOTE — Assessment & Plan Note (Signed)
Patient continues to respond somewhat to the nitroglycerin patch and we will continue this at the same dose. We discussed continuing home exercises working on hip abductor's. Patient is in custom orthotics which is helped with alignment and is somewhat decreased the amount of tenderness. We discussed the possibility of diagnostic as well as potential therapeutic ultrasound guided injections if she does not make any significant improvement. Patient feels though that she is improving slowly social hold on this for now. Patient though is going to be given a shot of a anti-inflammatory as well as an IM sterile for her travels. We discussed different ergonomic changes she can make for traveling. Patient and will follow-up with me again when she returns in 3 weeks.

## 2014-08-21 NOTE — Progress Notes (Signed)
[name] [address] [place], [place] [address] Phone: [phone] Subjective:     CC: Low back pain  [id number] [name] is a 39 y.o. female coming in with complaint of low back pain. Patient did have an MRI back in 2013 that showed a very small L5-S1 central protrusion but no significant nerve root impingement. Patient states she is given an epidural steroid injection but we did not have any significant improvement. Patient then had a sacroiliac joint on the right side injected as well also without any significant improvement. Patient states that it is more of a dull throbbing aching sensation that can be worse with certain activities. Sometimes as radiation that seems to be going to her hip. In 2014 patient did have an MRI of the hip that was unremarkable for any intra-articular pathology.   Patient was last seen 3 weeks ago. Patient was diagnosed with more of a sacroiliac joint dysfunction in the possibility of an obturator or piriformis syndrome or even tendinopathy of the right gluteal region.. Patient is in orthotics to help out as well secondary to improper alignment. Patient states that this continues to help. Patient states that the osteopathic manipulation does give her some mild improvement. With patient's tendinopathy of the right gluteal region she is also doing the nitroglycerin patch with no significant side effects and maybe some mild improvement. Vision is going to be traveling for the next 2 weeks and we'll be a car and is concerned and wants no other home modalities that can help while she is on the road. Patient states that still the pain seems to be worse at the end of the day.  Past Medical History  Diagnosis Date  . Allergy   . Anxiety    No past surgical history on file. History  Substance Use Topics  . Smoking status: Never Smoker   . Smokeless tobacco: Not on file  . Alcohol Use: Yes   No family history on  file. Allergies  Allergen Reactions  . Lamisil [Terbinafine Hcl] Hives and Itching  . Terbinafine Hcl Hives        Past medical history, social, surgical and family history all reviewed in electronic medical record.   Review of Systems: No headache, visual changes, nausea, vomiting, diarrhea, constipation, dizziness, abdominal pain, skin rash, fevers, chills, night sweats, weight loss, swollen lymph nodes, body aches, joint swelling, muscle aches, chest pain, shortness of breath, mood changes.   Objective Blood pressure 118/82, pulse 85, height 5' 3"  (1.6 m), weight 136 lb (61.689 kg), SpO2 97 %.  General: No apparent distress alert and oriented x3 mood and affect normal, dressed appropriately.  HEENT: Pupils equal, extraocular movements intact  Respiratory: Patient's speak in full sentences and does not appear short of breath  Cardiovascular: No lower extremity edema, non tender, no erythema  Skin: Warm dry intact with no signs of infection or rash on extremities or on axial skeleton.  Abdomen: Soft nontender  Neuro: Cranial nerves II through XII are intact, neurovascularly intact in all extremities with 2+ DTRs and 2+ pulses.  Lymph: No lymphadenopathy of posterior or anterior cervical chain or axillae bilaterally.  Gait normal with good balance and coordination.  MSK:  Non tender with full range of motion and good stability and symmetric strength and tone of shoulders, elbows, wrist, hip, knee and ankles bilaterally.  Back Exam:  Inspection: Unremarkable  Motion: Flexion 35 deg, Extension 25 deg, Side Bending to 45 deg bilaterally,  Rotation to 45 deg bilaterally  SLR laying: Negative  XSLR laying: Negative  Palpable tenderness: Tender to palpation over the right sacroiliac joint but also over the piriformis and inferior to this.. Minimal less tenderness than previous exam FABER: Positive Sensory change: Gross sensation intact to all lumbar and sacral dermatomes.  Reflexes: 2+  at both patellar tendons, 2+ at achilles tendons, Babinski's downgoing.  Strength at foot  Plantar-flexion: 5/5 Dorsi-flexion: 5/5 Eversion: 5/5 Inversion: 5/5  Leg strength  Quad: 5/5 Hamstring: 5/5 Hip flexor: 5/5 Hip abductors: 4/5 on left with full strength on right Gait unremarkable. Negative grinding on right hip  Osteopathic findings Cervical C2 flexed rotated and side bent right C6 flexed rotated and side bent left Thoracic T5 extended rotated and side bent right T9 extended rotated and side bent left Lumbar L2 flexed rotated and side bent right Sacrum Right on right Ileum Right anterior ilium with in flaring Same pattern as previously    Impression and Recommendations:     This case required medical decision making of moderate complexity.

## 2014-09-03 ENCOUNTER — Ambulatory Visit (INDEPENDENT_AMBULATORY_CARE_PROVIDER_SITE_OTHER): Payer: 59 | Admitting: Family Medicine

## 2014-09-03 ENCOUNTER — Encounter: Payer: Self-pay | Admitting: Family Medicine

## 2014-09-03 VITALS — BP 108/80 | HR 85 | Ht 63.0 in | Wt 137.0 lb

## 2014-09-03 DIAGNOSIS — M67951 Unspecified disorder of synovium and tendon, right thigh: Secondary | ICD-10-CM

## 2014-09-03 DIAGNOSIS — M999 Biomechanical lesion, unspecified: Secondary | ICD-10-CM

## 2014-09-03 DIAGNOSIS — M7601 Gluteal tendinitis, right hip: Secondary | ICD-10-CM | POA: Diagnosis not present

## 2014-09-03 DIAGNOSIS — M9904 Segmental and somatic dysfunction of sacral region: Secondary | ICD-10-CM | POA: Diagnosis not present

## 2014-09-03 NOTE — Progress Notes (Signed)
[name] [address] [place], [place] [address] Phone: [phone] Subjective:     CC: Low back pain  [id number] [name] is a 39 y.o. female coming in with complaint of low back pain. Patient did have an MRI back in 2013 that showed a very small L5-S1 central protrusion but no significant nerve root impingement. Patient states she is given an epidural steroid injection but we did not have any significant improvement. Patient then had a sacroiliac joint on the right side injected as well also without any significant improvement. Patient states that it is more of a dull throbbing aching sensation that can be worse with certain activities. Sometimes as radiation that seems to be going to her hip. In 2014 patient did have an MRI of the hip that was unremarkable for any intra-articular pathology.   She has been treated for more of a sacroiliac joint dysfunction. We have tried osteopathic manipulation. We discussed avoiding certain activities and patient was to be doing home exercises on a fairly regular basis. We discussed over-the-counter natural topical anti-inflammatory's in different treatment options. Patient states she was doing quite better until she went on 2 week vacation. Patient is now coming back from vacation. Having mild crease and discomfort. Patient left her shoes with the orthotics but they're being sent back to her house in the next couple days. Patient denies any radiation of pain. Nothing that is too alarming. Patient has not had anything that is stopping him from activity. Patient though is going to start working on a more regular basis. Patient will start doing the exercises she states on a more religious aspect. Denies any new symptoms.  Past Medical History  Diagnosis Date  . Allergy   . Anxiety    No past surgical history on file. History  Substance Use Topics  . Smoking status: Never Smoker   . Smokeless tobacco: Not on file  .  Alcohol Use: Yes   No family history on file. Allergies  Allergen Reactions  . Lamisil [Terbinafine Hcl] Hives and Itching  . Terbinafine Hcl Hives        Past medical history, social, surgical and family history all reviewed in electronic medical record.   Review of Systems: No headache, visual changes, nausea, vomiting, diarrhea, constipation, dizziness, abdominal pain, skin rash, fevers, chills, night sweats, weight loss, swollen lymph nodes, body aches, joint swelling, muscle aches, chest pain, shortness of breath, mood changes.   Objective There were no vitals taken for this visit.  General: No apparent distress alert and oriented x3 mood and affect normal, dressed appropriately.  HEENT: Pupils equal, extraocular movements intact  Respiratory: Patient's speak in full sentences and does not appear short of breath  Cardiovascular: No lower extremity edema, non tender, no erythema  Skin: Warm dry intact with no signs of infection or rash on extremities or on axial skeleton.  Abdomen: Soft nontender  Neuro: Cranial nerves II through XII are intact, neurovascularly intact in all extremities with 2+ DTRs and 2+ pulses.  Lymph: No lymphadenopathy of posterior or anterior cervical chain or axillae bilaterally.  Gait normal with good balance and coordination.  MSK:  Non tender with full range of motion and good stability and symmetric strength and tone of shoulders, elbows, wrist, hip, knee and ankles bilaterally.  Back Exam:  Inspection: Unremarkable  Motion: Flexion 35 deg, Extension 25 deg, Side Bending to 45 deg bilaterally,  Rotation to 45 deg bilaterally  SLR laying: Negative  XSLR laying: Negative  Palpable tenderness: Tender to palpation over the right sacroiliac joint but also over the piriformis and inferior to this.. Minimal less tenderness than previous exam FABER: Positive Sensory change: Gross sensation intact to all lumbar and sacral dermatomes.  Reflexes: 2+ at both  patellar tendons, 2+ at achilles tendons, Babinski's downgoing.  Strength at foot  Plantar-flexion: 5/5 Dorsi-flexion: 5/5 Eversion: 5/5 Inversion: 5/5  Leg strength  Quad: 5/5 Hamstring: 5/5 Hip flexor: 5/5 Hip abductors: 4/5 on left with full strength on right Gait unremarkable. Negative grinding on right hip  Osteopathic findings Cervical C2 flexed rotated and side bent right C5 flexed rotated and side bent left Thoracic T5 extended rotated and side bent right  Lumbar L2 flexed rotated and side bent right Sacrum Right on right Ileum Neutral   Impression and Recommendations:     This case required medical decision making of moderate complexity.

## 2014-09-03 NOTE — Assessment & Plan Note (Signed)
Decision today to treat with OMT was based on Physical Exam  After verbal consent patient was treated with HVLA, ME techniques in lumbar sacral and ilium areas  Patient tolerated the procedure well with improvement in symptoms  Patient given exercises, stretches and lifestyle modifications  See medications in patient instructions if given  Patient will follow up in 3-4 weeks

## 2014-09-03 NOTE — Patient Instructions (Addendum)
Good to see you as always.  Lets get in to the routine I hope UPS is on track Exercises, icing and the vitamins No changes  See me again in 3-4 weeks.

## 2014-09-03 NOTE — Assessment & Plan Note (Signed)
Patient was not doing the home exercises on a regular basis. Patient was not doing any other conservative therapy with her being on vacation. We discussed trying to do this on a more regular basis. Patient for the next month will be in town and working on a regular routine. We discussed patient doing this and seeing if she respond very well. Patient will come back and see me again in 3-4 weeks for further evaluation and treatment.

## 2014-09-03 NOTE — Progress Notes (Signed)
Pre visit review using our clinic review tool, if applicable. No additional management support is needed unless otherwise documented below in the visit note. 

## 2014-12-10 ENCOUNTER — Ambulatory Visit (INDEPENDENT_AMBULATORY_CARE_PROVIDER_SITE_OTHER): Payer: 59 | Admitting: Family Medicine

## 2014-12-10 ENCOUNTER — Encounter: Payer: Self-pay | Admitting: Family Medicine

## 2014-12-10 VITALS — BP 122/80 | HR 76 | Wt 140.0 lb

## 2014-12-10 DIAGNOSIS — M7601 Gluteal tendinitis, right hip: Secondary | ICD-10-CM

## 2014-12-10 DIAGNOSIS — M9905 Segmental and somatic dysfunction of pelvic region: Secondary | ICD-10-CM

## 2014-12-10 DIAGNOSIS — M67951 Unspecified disorder of synovium and tendon, right thigh: Secondary | ICD-10-CM

## 2014-12-10 DIAGNOSIS — M9904 Segmental and somatic dysfunction of sacral region: Secondary | ICD-10-CM

## 2014-12-10 DIAGNOSIS — M999 Biomechanical lesion, unspecified: Secondary | ICD-10-CM

## 2014-12-10 DIAGNOSIS — M9903 Segmental and somatic dysfunction of lumbar region: Secondary | ICD-10-CM | POA: Diagnosis not present

## 2014-12-10 NOTE — Assessment & Plan Note (Addendum)
Decision today to treat with OMT was based on Physical Exam  After verbal consent patient was treated with HVLA, ME techniques in lumbar sacral and ilium areas  Patient tolerated the procedure well with improvement in symptoms  Patient given exercises, stretches and lifestyle modifications  See medications in patient instructions if given  Patient will follow up in 4 weeks

## 2014-12-10 NOTE — Assessment & Plan Note (Signed)
Patient continues to have the difficulty with the obturator as well as the piriformis and the obturator muscle. Patient will also continue to work on doing her regular home exercises but we will refer to formal physical therapy as well. I think patient is motivated to get better but finds it difficult to focus on herself. Patient will come back again in 4-6 weeks.

## 2014-12-10 NOTE — Patient Instructions (Signed)
Good to see you [facility] will be calling you Ice when you need it.  Get back on the horse and try exercises 3 times a week if you can.  Continue the vitamins See me again in 406 weeks.

## 2014-12-10 NOTE — Progress Notes (Signed)
[name] [address] [place], [place] [address] Phone: [phone] Subjective:     CC: Low back pain  [id number] [name] is a 39 y.o. female coming in with complaint of low back pain. Patient did have an MRI back in 2013 that showed a very small L5-S1 central protrusion but no significant nerve root impingement. Patient states she is given an epidural steroid injection but we did not have any significant improvement. Patient then had a sacroiliac joint on the right side injected as well also without any significant improvement. Patient states that it is more of a dull throbbing aching sensation that can be worse with certain activities. Sometimes as radiation that seems to be going to her hip. In 2014 patient did have an MRI of the hip that was unremarkable for any intra-articular pathology.   She has been treated for more of a sacroiliac joint dysfunction. Patient at last exam was making some mild improvement. Patient was wearing the orthotics on a more regular basis. Patient was doing the exercises on a more regular basis as well. Patient states she is actually not been doing the exercises or doing any of the other modalities we've discussed previously. Patient does feel that she needs more help with somebody 2 holder [name]. States that it's only worsening of the previous symptoms no new symptoms.  Past Medical History  Diagnosis Date  . Allergy   . Anxiety    History reviewed. No pertinent past surgical history. Social History  Substance Use Topics  . Smoking status: Never Smoker   . Smokeless tobacco: None  . Alcohol Use: Yes   History reviewed. No pertinent family history. Allergies  Allergen Reactions  . Lamisil [Terbinafine Hcl] Hives and Itching  . Terbinafine Hcl Hives        Past medical history, social, surgical and family history all reviewed in electronic medical record.   Review of Systems: No headache, visual  changes, nausea, vomiting, diarrhea, constipation, dizziness, abdominal pain, skin rash, fevers, chills, night sweats, weight loss, swollen lymph nodes, body aches, joint swelling, muscle aches, chest pain, shortness of breath, mood changes.   Objective Blood pressure 122/80, pulse 76, weight 140 lb (63.504 kg).  General: No apparent distress alert and oriented x3 mood and affect normal, dressed appropriately.  HEENT: Pupils equal, extraocular movements intact  Respiratory: Patient's speak in full sentences and does not appear short of breath  Cardiovascular: No lower extremity edema, non tender, no erythema  Skin: Warm dry intact with no signs of infection or rash on extremities or on axial skeleton.  Abdomen: Soft nontender  Neuro: Cranial nerves II through XII are intact, neurovascularly intact in all extremities with 2+ DTRs and 2+ pulses.  Lymph: No lymphadenopathy of posterior or anterior cervical chain or axillae bilaterally.  Gait normal with good balance and coordination.  MSK:  Non tender with full range of motion and good stability and symmetric strength and tone of shoulders, elbows, wrist, hip, knee and ankles bilaterally.  Back Exam:  Inspection: Unremarkable  Motion: Flexion 35 deg, Extension 25 deg, Side Bending to 45 deg bilaterally,  Rotation to 45 deg bilaterally  SLR laying: Negative  XSLR laying: Negative  Palpable tenderness: Continued tenderness of the paraspinal musculature of the lumbar spine as well as the sacroiliac joint mild pain over the piriformis. FABER: Positive Sensory change: Gross sensation intact to all lumbar and sacral dermatomes.  Reflexes: 2+ at both patellar tendons, 2+ at achilles  tendons, Babinski's downgoing.  Strength at foot  Plantar-flexion: 5/5 Dorsi-flexion: 5/5 Eversion: 5/5 Inversion: 5/5  Leg strength  Quad: 5/5 Hamstring: 5/5 Hip flexor: 5/5 Hip abductors: 4/5 on left with full strength on right Gait unremarkable.  Osteopathic  findings Cervical C2 flexed rotated and side bent right C5 flexed rotated and side bent left Thoracic T5 extended rotated and side bent right inhaled fifth rib on right side  Lumbar L2 flexed rotated and side bent right Sacrum Right on right Ileum Neutral   Impression and Recommendations:     This case required medical decision making of moderate complexity.

## 2014-12-26 ENCOUNTER — Other Ambulatory Visit: Payer: Self-pay | Admitting: *Deleted

## 2014-12-26 DIAGNOSIS — M67951 Unspecified disorder of synovium and tendon, right thigh: Secondary | ICD-10-CM

## 2014-12-26 DIAGNOSIS — M7601 Gluteal tendinitis, right hip: Secondary | ICD-10-CM

## 2015-01-01 ENCOUNTER — Ambulatory Visit (INDEPENDENT_AMBULATORY_CARE_PROVIDER_SITE_OTHER): Payer: 59 | Admitting: Family Medicine

## 2015-01-01 ENCOUNTER — Ambulatory Visit (HOSPITAL_COMMUNITY)
Admission: RE | Admit: 2015-01-01 | Discharge: 2015-01-01 | Disposition: A | Discharge status: Home / Self Care | Payer: 59 | Source: Ambulatory Visit | Attending: Family Medicine | Admitting: Family Medicine

## 2015-01-01 ENCOUNTER — Encounter: Payer: Self-pay | Admitting: Family Medicine

## 2015-01-01 VITALS — BP 142/88 | HR 85 | Temp 98.2°F | Wt 139.8 lb

## 2015-01-01 DIAGNOSIS — I10 Essential (primary) hypertension: Secondary | ICD-10-CM | POA: Diagnosis not present

## 2015-01-01 DIAGNOSIS — R0789 Other chest pain: Secondary | ICD-10-CM

## 2015-01-01 DIAGNOSIS — Q615 Medullary cystic kidney: Secondary | ICD-10-CM | POA: Diagnosis not present

## 2015-01-01 MED ORDER — AMLODIPINE BESYLATE 2.5 MG PO TABS: 2.5000 mg | ORAL_TABLET | Freq: Every day | ORAL | Status: DC

## 2015-01-01 NOTE — Patient Instructions (Addendum)
Stop Toprol.  Start Amlodipine.  Plan to follow up in 1-2 weeks with Dr Brett Albino for BP check and medication follow up. I will contact you will the results of your labs.  If anything is abnormal, I will call you.  Otherwise, expect a copy to be mailed to you.  If your symptoms worsen, you develop weakness, slurred speech, dizziness, chest pain, shortness of breath, please go to the Emergency department for evaluation. Hypertension Hypertension, commonly called high blood pressure, is when the force of blood pumping through your arteries is too strong. Your arteries are the blood vessels that carry blood from your heart throughout your body. A blood pressure reading consists of a higher number over a lower number, such as 110/72. The higher number (systolic) is the pressure inside your arteries when your heart pumps. The lower number (diastolic) is the pressure inside your arteries when your heart relaxes. Ideally you want your blood pressure below 120/80. Hypertension forces your heart to work harder to pump blood. Your arteries may become narrow or stiff. Having untreated or uncontrolled hypertension can cause heart attack, stroke, kidney disease, and other problems. RISK FACTORS Some risk factors for high blood pressure are controllable. Others are not.  Risk factors you cannot control include:   Race. You may be at higher risk if you are African American.  Age. Risk increases with age.  Gender. Men are at higher risk than women before age 40 years. After age 54, women are at higher risk than men. Risk factors you can control include:  Not getting enough exercise or physical activity.  Being overweight.  Getting too much fat, sugar, calories, or salt in your diet.  Drinking too much alcohol. SIGNS AND SYMPTOMS Hypertension does not usually cause signs or symptoms. Extremely high blood pressure (hypertensive crisis) may cause headache, anxiety, shortness of breath, and nosebleed. DIAGNOSIS To  check if you have hypertension, your health care provider will measure your blood pressure while you are seated, with your arm held at the level of your heart. It should be measured at least twice using the same arm. Certain conditions can cause a difference in blood pressure between your right and left arms. A blood pressure reading that is higher than normal on one occasion does not mean that you need treatment. If it is not clear whether you have high blood pressure, you may be asked to return on a different day to have your blood pressure checked again. Or, you may be asked to monitor your blood pressure at home for 1 or more weeks. TREATMENT Treating high blood pressure includes making lifestyle changes and possibly taking medicine. Living a healthy lifestyle can help lower high blood pressure. You may need to change some of your habits. Lifestyle changes may include:  Following the DASH diet. This diet is high in fruits, vegetables, and whole grains. It is low in salt, red meat, and added sugars.  Keep your sodium intake below 2,300 mg per day.  Getting at least 30-45 minutes of aerobic exercise at least 4 times per week.  Losing weight if necessary.  Not smoking.  Limiting alcoholic beverages.  Learning ways to reduce stress. Your health care provider may prescribe medicine if lifestyle changes are not enough to get your blood pressure under control, and if one of the following is true:  You are 39-36 years of age and your systolic blood pressure is above 140.  You are 43 years of age or older, and your systolic blood  pressure is above 150.  Your diastolic blood pressure is above 90.  You have diabetes, and your systolic blood pressure is over 702 or your diastolic blood pressure is over 90.  You have kidney disease and your blood pressure is above 140/90.  You have heart disease and your blood pressure is above 140/90. Your personal target blood pressure may vary depending on  your medical conditions, your age, and other factors. HOME CARE INSTRUCTIONS  Have your blood pressure rechecked as directed by your health care provider.   Take medicines only as directed by your health care provider. Follow the directions carefully. Blood pressure medicines must be taken as prescribed. The medicine does not work as well when you skip doses. Skipping doses also puts you at risk for problems.  Do not smoke.   Monitor your blood pressure at home as directed by your health care provider. SEEK MEDICAL CARE IF:   You think you are having a reaction to medicines taken.  You have recurrent headaches or feel dizzy.  You have swelling in your ankles.  You have trouble with your vision. SEEK IMMEDIATE MEDICAL CARE IF:  You develop a severe headache or confusion.  You have unusual weakness, numbness, or feel faint.  You have severe chest or abdominal pain.  You vomit repeatedly.  You have trouble breathing. MAKE SURE YOU:   Understand these instructions.  Will watch your condition.  Will get help right away if you are not doing well or get worse.   This information is not intended to replace advice given to you by your health care provider. Make sure you discuss any questions you have with your health care provider.   Document Released: 02/23/2005 Document Revised: 07/10/2014 Document Reviewed: 12/16/2012 Elsevier Interactive Patient Education [employer].

## 2015-01-01 NOTE — Assessment & Plan Note (Signed)
BP 142/80.  Patient appears to not tolerate BB. - Stop Toprol - Start Norvasc 2.45m qd. - BMET ordered today - If not tolerating CCB, could consider thiazide diuretic. 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 - Return precautions reviewed with patient, see avs - follow up with PCP in 1-2 weeks for BP check and medication follow up

## 2015-01-01 NOTE — Progress Notes (Signed)
    Subjective: CC: HTN, elevated BP HPI: Patient is a 39 y.o. female presenting to clinic today for same day appt. Concerns today include:  1. Hypertension Patient notes that she had discontinued her HTN medication this summer.  She notes that she resumed medication about 1 month ago.  She notes that she feels a rushing sensation into her neck and head and this is how she knows her BP is up.  She notes a tightness around her neck over the last 3 days.  She notes that the sensation extends into her jaw.  Sensation was initially intermittent but since yesterday, has been constant.  Pressure seems to be worse with laying down.  She notes that her L carotid seems enlarged.  She has had this evaluated previously.  Occ has a shooting pain in the L side of her neck.  No dizziness, vision changes, CP, diaphoresis.  Occ has nausea without vomiting.   Blood pressure at home: 160/100 last night with a wrist cuff.  Not sure if she has rested enough time before reading. Blood pressure today: 142/80, repeat Meds: Compliant with Toprol XL 25 mg x3 weeks Side effects: Headache and fatigue but has resolved. ROS: Denies headache, dizziness, visual changes, vomiting, chest pain, abdominal pain or shortness of breath.  2. Medullary sponge kidney Patient reports that she was evaluated by nephrology previously.  She was told she did not need to f/u routinely for this.  She denies renal stones, difficulty urinating.  She does occ take Motrin for her back pain, also sees Dr Tamala Julian for this.  Has a h/o recurrent UTIs but currently w/o symptoms.  Social History Reviewed: non smoker. FamHx and MedHx updated.  Please see EMR.  ROS: Per HPI  Objective: Office vital signs reviewed. BP 142/88 mmHg  Pulse 85  Temp(Src) 98.2 F (36.8 C) (Oral)  Wt 139 lb 12.8 oz (63.413 kg)  SpO2 92%  Physical Examination:  General: Awake, alert, well nourished, NAD HEENT: Normal, PERRLA, EOMI, MMM Neck: no JVD, no palpable  masses or LAD, no discoloration of skin Cardio: RRR, S1S2 heard, no murmurs appreciated Pulm: CTAB, no wheezes, rhonchi or rales, normal WOB Extremities: WWP, No edema, cyanosis or clubbing; +2 radial pulses bilaterally MSK: Normal gait and station Skin: dry, intact, no rashes or lesions Neuro: Strength and sensation grossly intact, no focal deficits, follows commands.  Assessment/ Plan: 39 y.o. female with  ESSENTIAL HYPERTENSION, BENIGN BP 142/80.  Patient appears to not tolerate BB. - Stop Toprol - Start Norvasc 2.70m qd. - BMET ordered today - If not tolerating CCB, could consider thiazide diuretic. 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 - Return precautions reviewed with patient, see avs - follow up with PCP in 1-2 weeks for BP check and medication follow up  Atypical chest pain.  Patient with neck pain.  Denies SOB, CP.  BP has been elevated.  142/80, repeat 142/88  - EKG 12-Lead without signs of ischemia or abnormality - Return precautions reviewed.  MEDULLARY SPONGE KIDNEY - BMET today - Avoid overuse of NSAIDs - Could consider thiazide if needed for BP control. - Discussed dx and medication choices with Pharmacy - Follow up with PCP in 1-2 weeks.  AJanora Norlander DO PGY-2, [place]

## 2015-01-02 ENCOUNTER — Encounter: Payer: Self-pay | Admitting: Family Medicine

## 2015-01-02 LAB — BASIC METABOLIC PANEL WITH GFR
BUN: 13 mg/dL (ref 7–25)
CO2: 25 mmol/L (ref 20–31)
Calcium: 9.5 mg/dL (ref 8.6–10.2)
Chloride: 100 mmol/L (ref 98–110)
Creat: 0.72 mg/dL (ref 0.50–1.10)
GFR, Est African American: >89 mL/min (ref >=60)
Glucose, Bld: 84 mg/dL (ref 65–99)
POTASSIUM: 4.1 mmol/L (ref 3.5–5.3)
Sodium: 137 mmol/L (ref 135–146)

## 2015-01-07 ENCOUNTER — Ambulatory Visit (INDEPENDENT_AMBULATORY_CARE_PROVIDER_SITE_OTHER): Payer: 59 | Admitting: Family Medicine

## 2015-01-07 ENCOUNTER — Encounter: Payer: Self-pay | Admitting: Family Medicine

## 2015-01-07 VITALS — BP 122/82 | HR 75 | Ht 63.0 in | Wt 140.0 lb

## 2015-01-07 DIAGNOSIS — M9903 Segmental and somatic dysfunction of lumbar region: Secondary | ICD-10-CM | POA: Diagnosis not present

## 2015-01-07 DIAGNOSIS — M9904 Segmental and somatic dysfunction of sacral region: Secondary | ICD-10-CM | POA: Diagnosis not present

## 2015-01-07 DIAGNOSIS — M9905 Segmental and somatic dysfunction of pelvic region: Secondary | ICD-10-CM | POA: Diagnosis not present

## 2015-01-07 DIAGNOSIS — M999 Biomechanical lesion, unspecified: Secondary | ICD-10-CM

## 2015-01-07 DIAGNOSIS — M7601 Gluteal tendinitis, right hip: Secondary | ICD-10-CM

## 2015-01-07 DIAGNOSIS — M67951 Unspecified disorder of synovium and tendon, right thigh: Secondary | ICD-10-CM

## 2015-01-07 NOTE — Assessment & Plan Note (Signed)
Patient continues to have some discomfort overall. She is doing well as long as she does he exercises. I do not think advance procedures such as injection is warranted at this time. Encourage her to get an with formal physical therapy and she is awaiting scheduling for this. We discussed different sleeping wrist positions as well as ergonomics are up-to-date I can help. Patient will come back and see me again in 5-6 weeks for further evaluation and treatment. Continues to respond well to osteopathic manipulation.

## 2015-01-07 NOTE — Progress Notes (Signed)
Pre visit review using our clinic review tool, if applicable. No additional management support is needed unless otherwise documented below in the visit note. 

## 2015-01-07 NOTE — Progress Notes (Signed)
[name] [address], [place] [address] Phone: [phone] Subjective:     CC: Low back pain  [id number] [name] is a 39 y.o. female coming in with complaint of low back pain. Patient did have an MRI back in 2013 that showed a very small L5-S1 central protrusion but no significant nerve root impingement. Patient states she is given an epidural steroid injection but we did not have any significant improvement. Patient then had a sacroiliac joint on the right side injected as well also without any significant improvement.   She has been treated for more of a sacroiliac joint dysfunction. I do believe that there is likely also an obturator muscle injury versus nerve root impingement. Patient has now been going to formal physical therapy when she can. Has had difficulty secondary to her other family obligations. Patient states overall when she does do the exercises on a regular basis she seems to do better. Not hurting her as much. Some mild increasing upper back pain. Nothing that stopping her from activity. Patient feels that as long as she would start formal physical therapy which she has not been able to do that this will be very beneficial. Patient is more motivated and she has been recently. Having some difficulty sleeping at night though still from the discomfort.  Past Medical History  Diagnosis Date  . Allergy   . Anxiety    No past surgical history on file. Social History  Substance Use Topics  . Smoking status: Never Smoker   . Smokeless tobacco: None  . Alcohol Use: Yes   No family history on file. Allergies  Allergen Reactions  . Lamisil [Terbinafine Hcl] Hives and Itching  . Terbinafine Hcl Hives        Past medical history, social, surgical and family history all reviewed in electronic medical record.   Review of Systems: No headache, visual changes, nausea, vomiting, diarrhea, constipation, dizziness, abdominal  pain, skin rash, fevers, chills, night sweats, weight loss, swollen lymph nodes, body aches, joint swelling, muscle aches, chest pain, shortness of breath, mood changes.   Objective Blood pressure 122/82, pulse 75, height 5' 3"  (1.6 m), weight 140 lb (63.504 kg), SpO2 98 %.  General: No apparent distress alert and oriented x3 mood and affect normal, dressed appropriately.  HEENT: Pupils equal, extraocular movements intact  Respiratory: Patient's speak in full sentences and does not appear short of breath  Cardiovascular: No lower extremity edema, non tender, no erythema  Skin: Warm dry intact with no signs of infection or rash on extremities or on axial skeleton.  Abdomen: Soft nontender  Neuro: Cranial nerves II through XII are intact, neurovascularly intact in all extremities with 2+ DTRs and 2+ pulses.  Lymph: No lymphadenopathy of posterior or anterior cervical chain or axillae bilaterally.  Gait normal with good balance and coordination.  MSK:  Non tender with full range of motion and good stability and symmetric strength and tone of shoulders, elbows, wrist, hip, knee and ankles bilaterally.  Back Exam:  Inspection: Unremarkable  Motion: Flexion 35 deg, Extension 25 deg, Side Bending to 45 deg bilaterally, no change in range of motion Rotation to 45 deg bilaterally  SLR laying: Negative  XSLR laying: Negative  Palpable tenderness: Less tender than previous exam but continued discomfort. FABER: Positive Sensory change: Gross sensation intact to all lumbar and sacral dermatomes.  Reflexes: 2+ at both patellar tendons, 2+ at achilles tendons, Babinski's downgoing.  Strength at foot  Plantar-flexion: 5/5 Dorsi-flexion: 5/5 Eversion: 5/5 Inversion: 5/5  Leg strength  Quad: 5/5 Hamstring: 5/5 Hip flexor: 5/5 Hip abductors: 4/5 on left with full strength on right Gait unremarkable.  Osteopathic findings Cervical C2 flexed rotated and side bent right C5 flexed rotated and side bent  left Thoracic T1 extended rotated and side bent left with inhaled first rib T5 extended rotated and side bent right inhaled fifth rib on right side  Lumbar L2 flexed rotated and side bent right Sacrum Right on right Ileum Neutral   Impression and Recommendations:     This case required medical decision making of moderate complexity.

## 2015-01-07 NOTE — Patient Instructions (Addendum)
Good to see you Ice is your friend I am impressed overall When sleeping try to keep left arm down if possible Change your pillow Try PT because it will help See me again 5-6 weeks.

## 2015-01-07 NOTE — Assessment & Plan Note (Signed)
Decision today to treat with OMT was based on Physical Exam  After verbal consent patient was treated with HVLA, ME techniques in lumbar sacral and ilium areas  Patient tolerated the procedure well with improvement in symptoms  Patient given exercises, stretches and lifestyle modifications  See medications in patient instructions if given  Patient will follow up in 5-6 weeks

## 2015-01-14 ENCOUNTER — Encounter: Payer: Self-pay | Admitting: Family Medicine

## 2015-01-14 ENCOUNTER — Ambulatory Visit (INDEPENDENT_AMBULATORY_CARE_PROVIDER_SITE_OTHER): Payer: 59 | Admitting: Family Medicine

## 2015-01-14 VITALS — BP 118/72 | HR 72 | Temp 98.3°F | Ht 63.0 in | Wt 139.8 lb

## 2015-01-14 DIAGNOSIS — I10 Essential (primary) hypertension: Secondary | ICD-10-CM

## 2015-01-14 NOTE — Assessment & Plan Note (Signed)
At goal today with resolution of symptoms (some of which were likely not related to BP). Significant response to such a low dose of norvasc suggests other factors at play (e.g. stress/anxiety). No SEs. Also discussed role of DASH diet elements and increased exercise. (normal BMI). No labs today. Follow up with PCP in 6 - 8 weeks.

## 2015-01-14 NOTE — Patient Instructions (Signed)
It was nice to meet you!  - High blood pressure is best treated by taking medications as directed, avoiding salt in your diet and increasing potassium from fruits and vegetables (called the DASH diet). - If you feel faint, experience new/worsening chest pain or shortness of breath, or notice rapid leg swelling and/or weight gain you should call the clinic at [phone] OR go directly to the ER.   Take care,  - Dr. Bonner Puna  DASH Eating Plan DASH stands for "Dietary Approaches to Stop Hypertension." The DASH eating plan is a healthy eating plan that has been shown to reduce high blood pressure (hypertension). Additional health benefits may include reducing the risk of type 2 diabetes mellitus, heart disease, and stroke. The DASH eating plan may also help with weight loss. WHAT DO I NEED TO KNOW ABOUT THE DASH EATING PLAN? For the DASH eating plan, you will follow these general guidelines:  Choose foods with a percent daily value for sodium of less than 5% (as listed on the food label).  Use salt-free seasonings or herbs instead of table salt or sea salt.  Check with your health care provider or pharmacist before using salt substitutes.  Eat lower-sodium products, often labeled as "lower sodium" or "no salt added."  Eat fresh foods.  Eat more vegetables, fruits, and low-fat dairy products.  Choose whole grains. Look for the word "whole" as the first word in the ingredient list.  Choose fish and skinless chicken or [place] more often than red meat. Limit fish, poultry, and meat to 6 oz (170 g) each day.  Limit sweets, desserts, sugars, and sugary drinks.  Choose heart-healthy fats.  Limit cheese to 1 oz (28 g) per day.  Eat more home-cooked food and less restaurant, buffet, and fast food.  Limit fried foods.  Cook foods using methods other than frying.  Limit canned vegetables. If you do use them, rinse them well to decrease the sodium.  When eating at a restaurant, ask that your  food be prepared with less salt, or no salt if possible. WHAT FOODS CAN I EAT? Seek help from a dietitian for individual calorie needs. Grains Whole grain or whole wheat bread. Brown rice. Whole grain or whole wheat pasta. Quinoa, bulgur, and whole grain cereals. Low-sodium cereals. Corn or whole wheat flour tortillas. Whole grain cornbread. Whole grain crackers. Low-sodium crackers. Vegetables Fresh or frozen vegetables (raw, steamed, roasted, or grilled). Low-sodium or reduced-sodium tomato and vegetable juices. Low-sodium or reduced-sodium tomato sauce and paste. Low-sodium or reduced-sodium canned vegetables.  Fruits All fresh, canned (in natural juice), or frozen fruits. Meat and Other Protein Products Ground beef (85% or leaner), grass-fed beef, or beef trimmed of fat. Skinless chicken or [place]. Ground chicken or [place]. Pork trimmed of fat. All fish and seafood. Eggs. Dried beans, peas, or lentils. Unsalted nuts and seeds. Unsalted canned beans. Dairy Low-fat dairy products, such as skim or 1% milk, 2% or reduced-fat cheeses, low-fat ricotta or cottage cheese, or plain low-fat yogurt. Low-sodium or reduced-sodium cheeses. Fats and Oils Tub margarines without trans fats. Light or reduced-fat mayonnaise and salad dressings (reduced sodium). Avocado. Safflower, olive, or canola oils. Natural peanut or almond butter. Other Unsalted popcorn and pretzels.  WHAT FOODS ARE NOT RECOMMENDED? Grains White bread. White pasta. White rice. Refined cornbread. Bagels and croissants. Crackers that contain trans fat. Vegetables Creamed or fried vegetables. Vegetables in a cheese sauce. Regular canned vegetables. Regular canned tomato sauce and paste. Regular tomato and vegetable juices. Fruits Dried  fruits. Canned fruit in light or heavy syrup. Fruit juice. Meat and Other Protein Products Fatty cuts of meat. Ribs, chicken wings, bacon, sausage, bologna, salami, chitterlings, fatback, hot dogs,  bratwurst, and packaged luncheon meats. Salted nuts and seeds. Canned beans with salt. Dairy Whole or 2% milk, cream, half-and-half, and cream cheese. Whole-fat or sweetened yogurt. Full-fat cheeses or blue cheese. Nondairy creamers and whipped toppings. Processed cheese, cheese spreads, or cheese curds. Condiments Onion and garlic salt, seasoned salt, table salt, and sea salt. Canned and packaged gravies. Worcestershire sauce. Tartar sauce. Barbecue sauce. Teriyaki sauce. Soy sauce, including reduced sodium. Steak sauce. Fish sauce. Oyster sauce. Cocktail sauce. Horseradish. Ketchup and mustard. Meat flavorings and tenderizers. Bouillon cubes. Hot sauce. Tabasco sauce. Marinades. Taco seasonings. Relishes. Fats and Oils Butter, stick margarine, lard, shortening, ghee, and bacon fat. Coconut, palm kernel, or palm oils. Regular salad dressings. Other Pickles and olives. Salted popcorn and pretzels. The items listed above may not be a complete list of foods and beverages to avoid. Contact your dietitian for more information. WHERE CAN I FIND MORE INFORMATION? National [employer], [employer], [employer] Institute: travelstabloid.com

## 2015-01-14 NOTE — Progress Notes (Signed)
Subjective: [name] is a 39 y.o. female here for hypertension follow up.   BP at home checked about every other day in 120-130s/high70s-80s. Now 100% compliance with norvasc 2.56m daily. Trying to eat less salt and more fruits. Her neck pain is gone.  - ROS: Denies CP, SOB, palpitations, syncope, dizziness, orthopnea, PND, headaches, vision changes, claudication, leg swelling. - PMFSH: [facility] non-smoker, no EtOH, no illicit drugs. - Medications: reviewed and updated  Objective: BP 118/72 mmHg  Pulse 72  Temp(Src) 98.3 F (36.8 C) (Oral)  Ht 5' 3"  (1.6 m)  Wt 139 lb 12.8 oz (63.413 kg)  BMI 24.77 kg/m2  SpO2 99% Gen: Pleasant, well-appearing 39y.o.female in no distress Neck: brisk carotid upstroke, no bruits; thyroid not enlarged  Pulm: Non-labored breathing room air; CTAB CV: Regular rate with normal S1/S2, no murmur; no LE edema, no JVD; DP and radial pulses symmetric and 2+. Homan's sign absent. cap refill < 2 sec.  GI: Nontender, nondistended, no HSM   Assessment & Plan: Lorraine STREIGHTis a 39y.o. female here for hypertension, currently well controlled.  Essential hypertension, benign At goal today with resolution of symptoms (some of which were likely not related to BP). Significant response to such a low dose of norvasc suggests other factors at play (e.g. stress/anxiety). No SEs. Also discussed role of DASH diet elements and increased exercise. (normal BMI). No labs today. Follow up with PCP in 6 - 8 weeks.

## 2015-01-23 ENCOUNTER — Encounter: Payer: Self-pay | Admitting: Family Medicine

## 2015-01-23 ENCOUNTER — Ambulatory Visit (INDEPENDENT_AMBULATORY_CARE_PROVIDER_SITE_OTHER): Payer: 59 | Admitting: Family Medicine

## 2015-01-23 VITALS — BP 114/80 | HR 83 | Ht 63.0 in | Wt 139.0 lb

## 2015-01-23 DIAGNOSIS — M549 Dorsalgia, unspecified: Secondary | ICD-10-CM | POA: Diagnosis not present

## 2015-01-23 DIAGNOSIS — M9904 Segmental and somatic dysfunction of sacral region: Secondary | ICD-10-CM

## 2015-01-23 DIAGNOSIS — M9903 Segmental and somatic dysfunction of lumbar region: Secondary | ICD-10-CM | POA: Diagnosis not present

## 2015-01-23 DIAGNOSIS — M9905 Segmental and somatic dysfunction of pelvic region: Secondary | ICD-10-CM

## 2015-01-23 DIAGNOSIS — M999 Biomechanical lesion, unspecified: Secondary | ICD-10-CM

## 2015-01-23 MED ORDER — DICLOFENAC SODIUM 2 % TD SOLN: TRANSDERMAL | Status: DC

## 2015-01-23 NOTE — Patient Instructions (Signed)
Good to see you Ice is your friend pennsaid pinkie amount topically 2 times daily as needed.  Try new shoes for next 6 hours and then increase 2 hoursa day See me again soon.

## 2015-01-23 NOTE — Progress Notes (Signed)
Pre visit review using our clinic review tool, if applicable. No additional management support is needed unless otherwise documented below in the visit note. 

## 2015-01-23 NOTE — Assessment & Plan Note (Signed)
Decision today to treat with OMT was based on Physical Exam  After verbal consent patient was treated with HVLA, ME techniques in lumbar sacral and ilium areas  Patient tolerated the procedure well with improvement in symptoms  Patient given exercises, stretches and lifestyle modifications  See medications in patient instructions if given  Patient will follow up in 5-6 weeks

## 2015-01-23 NOTE — Progress Notes (Signed)
[name] [address], [place] [address] Phone: [phone] Subjective:     CC: Low back pain follow up  [id number] [name] is a 39 y.o. female coming in with complaint of low back pain. Patient did have an MRI back in 2013 that showed a very small L5-S1 central protrusion but no significant nerve root impingement. Patient states she is given an epidural steroid injection but we did not have any significant improvement.    Has been seen me for more of a sacroiliac dysfunction. Patient has been responding well to the home exercises as well as gluteal tendon exercises. Patient has also done well with osteopathic manipulation. States that overall seems to be doing relatively well. Starting to increase her activity and work out on a more regular basis.  Patient was putting custom orthotics for her back which has helped her back. Patient does notice that she's been having some mild numbness in the right anterior ankle. States that it doesn't stop her from any activity. Seems to be worse when she is standing on it for long amount of time.  Past Medical History  Diagnosis Date  . Allergy   . Anxiety    No past surgical history on file. Social History  Substance Use Topics  . Smoking status: Never Smoker   . Smokeless tobacco: None  . Alcohol Use: Yes   No family history on file. Allergies  Allergen Reactions  . Lamisil [Terbinafine Hcl] Hives and Itching  . Terbinafine Hcl Hives        Past medical history, social, surgical and family history all reviewed in electronic medical record.   Review of Systems: No headache, visual changes, nausea, vomiting, diarrhea, constipation, dizziness, abdominal pain, skin rash, fevers, chills, night sweats, weight loss, swollen lymph nodes, body aches, joint swelling, muscle aches, chest pain, shortness of breath, mood changes.   Objective Blood pressure 114/80, pulse 83, height 5' 3"  (1.6 m),  weight 139 lb (63.05 kg), SpO2 99 %.  General: No apparent distress alert and oriented x3 mood and affect normal, dressed appropriately.  HEENT: Pupils equal, extraocular movements intact  Respiratory: Patient's speak in full sentences and does not appear short of breath  Cardiovascular: No lower extremity edema, non tender, no erythema  Skin: Warm dry intact with no signs of infection or rash on extremities or on axial skeleton.  Abdomen: Soft nontender  Neuro: Cranial nerves II through XII are intact, neurovascularly intact in all extremities with 2+ DTRs and 2+ pulses.  Lymph: No lymphadenopathy of posterior or anterior cervical chain or axillae bilaterally.  Gait normal with good balance and coordination.  MSK:  Non tender with full range of motion and good stability and symmetric strength and tone of shoulders, elbows, wrist, hip, knee and ankles bilaterally.  Back Exam:  Inspection: Unremarkable  Motion: Flexion 35 deg, Extension 25 deg, Side Bending to 45 deg bilaterally, no change in range of motion Rotation to 45 deg bilaterally  SLR laying: Negative  XSLR laying: Negative  Palpable tenderness: Less tender than previous exam but continued discomfort. FABER: Positive Sensory change: Gross sensation intact to all lumbar and sacral dermatomes.  Reflexes: 2+ at both patellar tendons, 2+ at achilles tendons, Babinski's downgoing.  Strength at foot  Plantar-flexion: 5/5 Dorsi-flexion: 5/5 Eversion: 5/5 Inversion: 5/5  Leg strength  Quad: 5/5 Hamstring: 5/5 Hip flexor: 5/5 Hip abductors: 4/5 on left with full strength on right Gait unremarkable.  Osteopathic findings Cervical  C2 flexed rotated and side bent right C5 flexed rotated and side bent left Thoracic T1 extended rotated and side bent left with inhaled first rib T5 extended rotated and side bent right  Lumbar L2 flexed rotated and side bent right Sacrum Right on right Ileum Neutral   Impression and Recommendations:       This case required medical decision making of moderate complexity.

## 2015-01-23 NOTE — Assessment & Plan Note (Signed)
Patient overall is doing relatively better. I do not think that the numbness in her foot is coming from that. We made adjustments to her custom orthotics. Patient is going to continue with the same regular daily activities. Patient come back and see me again in 4-6 weeks. We will continue with the conservative therapy as well as she continues to respond.

## 2015-01-28 ENCOUNTER — Other Ambulatory Visit: Payer: Self-pay | Admitting: Family Medicine

## 2015-01-30 ENCOUNTER — Ambulatory Visit: Payer: 59 | Admitting: Internal Medicine

## 2015-02-12 ENCOUNTER — Ambulatory Visit: Payer: 59 | Admitting: Family Medicine

## 2015-02-19 ENCOUNTER — Encounter: Payer: Self-pay | Admitting: Internal Medicine

## 2015-02-19 ENCOUNTER — Ambulatory Visit (INDEPENDENT_AMBULATORY_CARE_PROVIDER_SITE_OTHER): Payer: 59 | Admitting: Internal Medicine

## 2015-02-19 ENCOUNTER — Encounter: Payer: Self-pay | Admitting: Family Medicine

## 2015-02-19 ENCOUNTER — Ambulatory Visit: Payer: 59 | Admitting: Family Medicine

## 2015-02-19 ENCOUNTER — Ambulatory Visit (INDEPENDENT_AMBULATORY_CARE_PROVIDER_SITE_OTHER): Payer: 59 | Admitting: Family Medicine

## 2015-02-19 VITALS — BP 126/86 | HR 79 | Temp 98.6°F | Resp 16 | Ht 63.0 in | Wt 139.0 lb

## 2015-02-19 VITALS — BP 126/86 | HR 79 | Ht 63.0 in | Wt 139.0 lb

## 2015-02-19 DIAGNOSIS — M9904 Segmental and somatic dysfunction of sacral region: Secondary | ICD-10-CM | POA: Diagnosis not present

## 2015-02-19 DIAGNOSIS — M549 Dorsalgia, unspecified: Secondary | ICD-10-CM

## 2015-02-19 DIAGNOSIS — I10 Essential (primary) hypertension: Secondary | ICD-10-CM

## 2015-02-19 DIAGNOSIS — N390 Urinary tract infection, site not specified: Secondary | ICD-10-CM | POA: Diagnosis not present

## 2015-02-19 DIAGNOSIS — M9903 Segmental and somatic dysfunction of lumbar region: Secondary | ICD-10-CM | POA: Diagnosis not present

## 2015-02-19 DIAGNOSIS — M999 Biomechanical lesion, unspecified: Secondary | ICD-10-CM

## 2015-02-19 DIAGNOSIS — M9905 Segmental and somatic dysfunction of pelvic region: Secondary | ICD-10-CM | POA: Diagnosis not present

## 2015-02-19 DIAGNOSIS — Q615 Medullary cystic kidney: Secondary | ICD-10-CM

## 2015-02-19 NOTE — Assessment & Plan Note (Signed)
Patient back pain seems to be improving. Encourage patient to continue to work out. Patient has made some lifestyle changes that seem to be significantly beneficial. Encourage patient to continue to work on hip abductor strength as well as core. Patient will come back and see me again in 6 weeks for further evaluation and treatment.

## 2015-02-19 NOTE — Assessment & Plan Note (Signed)
Following with urology Macrodantin prn after intercourse

## 2015-02-19 NOTE — Assessment & Plan Note (Signed)
Decision today to treat with OMT was based on Physical Exam  After verbal consent patient was treated with HVLA, ME techniques in lumbar sacral and ilium areas  Patient tolerated the procedure well with improvement in symptoms  Patient given exercises, stretches and lifestyle modifications  See medications in patient instructions if given  Patient will follow up in 6 weeks

## 2015-02-19 NOTE — Progress Notes (Signed)
Pre visit review using our clinic review tool, if applicable. No additional management support is needed unless otherwise documented below in the visit note. 

## 2015-02-19 NOTE — Patient Instructions (Signed)
  We have reviewed your prior records including labs and tests today.  Test(s) ordered today. Your results will be released to [place] (or called to you) after review, usually within 72hours after test completion. If any changes need to be made, you will be notified at that same time.  All other Health Maintenance issues reviewed.   All recommended immunizations and age-appropriate screenings are up-to-date.  No immunizations administered today.   Medications reviewed and updated.  No changes recommended at this time.   Please followup annually, sooner if needed

## 2015-02-19 NOTE — Patient Instructions (Signed)
Good to see you Ice is your friend Stay active See me agai nin 6 weeks you are doing great Happy holidays!

## 2015-02-19 NOTE — Progress Notes (Signed)
[name] [address], [place] [address] Phone: [phone] Subjective:     CC: Low back pain follow up  [id number] [name] is a 39 y.o. female coming in with complaint of low back pain. Patient did have an MRI back in 2013 that showed a very small L5-S1 central protrusion but no significant nerve root impingement. Patient states she is given an epidural steroid injection but we did not have any significant improvement.    Has been seen me for more of a sacroiliac dysfunction. Patient has been responding well to the home exercises as well as gluteal tendon exercises. Patient continues to make significant improvement. Notices that she does not feel as fatigued at the end of the long day. No radicular symptoms. Nothing that is stopping her from activity. No exacerbations. Has not taken any pain medications. Happy with the results.    Past Medical History  Diagnosis Date  . Allergy   . Hypertension    Past Surgical History  Procedure Laterality Date  . Tubal ligation    . Cholecystectomy     Social History  Substance Use Topics  . Smoking status: Never Smoker   . Smokeless tobacco: Not on file  . Alcohol Use: Yes   Family History  Problem Relation Age of Onset  . Cancer Mother     Breast Cancer  . Hypertension Father   . Hyperlipidemia Father   . Stroke Maternal Grandmother   . Alcohol abuse Paternal Grandmother   . Heart disease Paternal Grandfather   . Other Brother     lyme   Allergies  Allergen Reactions  . Lamisil [Terbinafine Hcl] Hives and Itching  . Terbinafine Hcl Hives        Past medical history, social, surgical and family history all reviewed in electronic medical record.   Review of Systems: No headache, visual changes, nausea, vomiting, diarrhea, constipation, dizziness, abdominal pain, skin rash, fevers, chills, night sweats, weight loss, swollen lymph nodes, body aches, joint swelling, muscle  aches, chest pain, shortness of breath, mood changes.   Objective Blood pressure 126/86, pulse 79, height 5' 3"  (1.6 m), weight 139 lb (63.05 kg), SpO2 99 %.  General: No apparent distress alert and oriented x3 mood and affect normal, dressed appropriately.  HEENT: Pupils equal, extraocular movements intact  Respiratory: Patient's speak in full sentences and does not appear short of breath  Cardiovascular: No lower extremity edema, non tender, no erythema  Skin: Warm dry intact with no signs of infection or rash on extremities or on axial skeleton.  Abdomen: Soft nontender  Neuro: Cranial nerves II through XII are intact, neurovascularly intact in all extremities with 2+ DTRs and 2+ pulses.  Lymph: No lymphadenopathy of posterior or anterior cervical chain or axillae bilaterally.  Gait normal with good balance and coordination.  MSK:  Non tender with full range of motion and good stability and symmetric strength and tone of shoulders, elbows, wrist, hip, knee and ankles bilaterally.  Back Exam:  Inspection: Unremarkable  Motion: Flexion 35 deg, Extension 25 deg, Side Bending to 45 deg bilaterally, no change in range of motion Rotation to 45 deg bilaterally  SLR laying: Negative  XSLR laying: Negative  Palpable tenderness: Continued improvement in tenderness. FABER: Positive still on the left side but mild. Sensory change: Gross sensation intact to all lumbar and sacral dermatomes.  Reflexes: 2+ at both patellar tendons, 2+ at achilles tendons, Babinski's downgoing.  Strength at foot  Plantar-flexion: 5/5 Dorsi-flexion: 5/5 Eversion: 5/5 Inversion: 5/5  Leg strength  Quad: 5/5 Hamstring: 5/5 Hip flexor: 5/5 Hip abductors: 4/5 on left with full strength on right Gait unremarkable.  Osteopathic findings Cervical C2 flexed rotated and side bent right C5 flexed rotated and side bent left Thoracic T1 extended rotated and side bent left with inhaled first rib T5 extended rotated and side  bent right  Lumbar L2 flexed rotated and side bent right Sacrum Right on right Ileum Neutral   Impression and Recommendations:     This case required medical decision making of moderate complexity.

## 2015-02-19 NOTE — Progress Notes (Signed)
Subjective:    Patient ID: [name], female    DOB: 1975-03-12, 39 y.o.   MRN: [id number]  HPI She is here to establish with a new pcp.   Hypertension: She is taking her medication daily. She is compliant with a low sodium diet.  She denies chest pain, palpitations, edema, shortness of breath and regular headaches. She is exercising regularly.  She does monitor her blood pressure at home and typically gets 120's/80's..    Recurrent UTI's:  She has seen urology.  She takes macrodantin after intercourse and that typically prevents the UTIs.     Medications and allergies reviewed with patient and updated if appropriate.  Patient Active Problem List   Diagnosis Date Noted  . 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 Nonallopathic lesion of lumbosacral region 06/29/2014  . Nonallopathic lesion of sacral region 06/29/2014  . Nonallopathic lesion of pelvic region 06/29/2014  . Recurrent UTI 05/18/2013  . Back pain, acute 10/07/2010  . Allergic rhinitis 08/14/2010  . Essential hypertension, benign 01/22/2010    Current Outpatient Prescriptions on File Prior to Visit  Medication Sig Dispense Refill  . acetaminophen (TYLENOL) 500 MG tablet Take 500 mg by mouth.    Marland Kitchen amLODipine (NORVASC) 2.5 MG tablet TAKE 1 TABLET BY MOUTH DAILY. 30 tablet 0  . Diclofenac Sodium 2 % SOLN Apply 1 pump twice daily as needed. 112 g 3  . ibuprofen (ADVIL,MOTRIN) 200 MG tablet Take 200 mg by mouth.     No current facility-administered medications on file prior to visit.    Past Medical History  Diagnosis Date  . Allergy   . Hypertension     Past Surgical History  Procedure Laterality Date  . Tubal ligation    . Cholecystectomy      Social History   Social History  . Marital Status: Married    Spouse Name: N/A  . Number of Children: N/A  . Years of Education: N/A   Social History Main Topics  . Smoking status: Never Smoker   .  Smokeless tobacco: None  . Alcohol Use: Yes  . Drug Use: No  . Sexual Activity: Yes   Other Topics Concern  . 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  HENT: Positive for congestion (in morning only). Negative for sinus pressure.   Respiratory: Negative for cough, shortness of breath and wheezing.   Cardiovascular: Negative for chest pain, palpitations and leg swelling.  Neurological: Positive for headaches (in morning, ? sinus related). Negative for dizziness and light-headedness.       Objective:   Filed Vitals:   02/19/15 0805  BP: 126/86  Pulse: 79  Temp: 98.6 F (37 C)  Resp: 16   Filed Weights   02/19/15 0805  Weight: 139 lb (63.05 kg)   Body mass index is 24.63 kg/(m^2).   Physical Exam Constitutional: Appears well-developed and well-nourished. No distress.  Neck: Neck supple. No tracheal deviation present. No thyromegaly present.  No carotid bruit. No cervical adenopathy.   Cardiovascular: Normal rate, regular rhythm and normal heart sounds.   No murmur heard. Pulmonary/Chest: Effort normal and breath sounds normal. No respiratory distress. No wheezes.  Musculoskeletal: No edema.      Assessment & Plan:   See Problem List.  Screening blood work ordered   Follow up  annually

## 2015-02-19 NOTE — Assessment & Plan Note (Signed)
BP controlled here today and well controlled at home Continue low dose norvasc 2.5 mg daily - she has not been able to come off of it Monitor BP at home Check blood work  Follow up annuall

## 2015-02-19 NOTE — Assessment & Plan Note (Signed)
Congenital Last cmp normal, will recheck since she is getting blood work done No history of kidney stones Recurrent UTI - typically related to intercourse Following with urology

## 2015-03-06 ENCOUNTER — Telehealth: Payer: Self-pay | Admitting: *Deleted

## 2015-03-06 MED ORDER — AMLODIPINE BESYLATE 2.5 MG PO TABS: 2.5000 mg | ORAL_TABLET | Freq: Every day | ORAL | Status: DC

## 2015-03-06 NOTE — Telephone Encounter (Signed)
Left msg on triage stating needing refill on her amlodipine. Sent to [facility] pharmacy.Called pt back no answer LMOM rx sent to [facility]

## 2015-03-14 ENCOUNTER — Telehealth: Payer: Self-pay | Admitting: Internal Medicine

## 2015-03-14 NOTE — Telephone Encounter (Signed)
Pt states her son Roderic Palau has injured his left knee and was hoping to see you. He would be a new patient . I informed her your schedule was full and she was hoping you could give her a recommendation. She states he needs to see someone today. Can you please give her a call at [phone]

## 2015-03-20 ENCOUNTER — Encounter: Payer: Self-pay | Admitting: Family Medicine

## 2015-03-20 ENCOUNTER — Ambulatory Visit (INDEPENDENT_AMBULATORY_CARE_PROVIDER_SITE_OTHER): Payer: 59 | Admitting: Family Medicine

## 2015-03-20 VITALS — BP 124/74 | HR 78 | Ht 63.0 in | Wt 137.0 lb

## 2015-03-20 DIAGNOSIS — M533 Sacrococcygeal disorders, not elsewhere classified: Secondary | ICD-10-CM | POA: Diagnosis not present

## 2015-03-20 DIAGNOSIS — M899 Disorder of bone, unspecified: Secondary | ICD-10-CM

## 2015-03-20 DIAGNOSIS — M549 Dorsalgia, unspecified: Secondary | ICD-10-CM

## 2015-03-20 DIAGNOSIS — M9903 Segmental and somatic dysfunction of lumbar region: Secondary | ICD-10-CM | POA: Diagnosis not present

## 2015-03-20 DIAGNOSIS — M9908 Segmental and somatic dysfunction of rib cage: Secondary | ICD-10-CM

## 2015-03-20 DIAGNOSIS — M999 Biomechanical lesion, unspecified: Secondary | ICD-10-CM

## 2015-03-20 DIAGNOSIS — M9901 Segmental and somatic dysfunction of cervical region: Secondary | ICD-10-CM

## 2015-03-20 DIAGNOSIS — M9905 Segmental and somatic dysfunction of pelvic region: Secondary | ICD-10-CM

## 2015-03-20 MED ORDER — MELOXICAM 15 MG PO TABS: 15.0000 mg | ORAL_TABLET | Freq: Every day | ORAL | Status: DC

## 2015-03-20 MED FILL — MELOXICAM 15 MG TABLET: 15 | 90 days supply | Qty: 90 | Fill #0

## 2015-03-20 NOTE — Assessment & Plan Note (Signed)
Decision today to treat with OMT was based on Physical Exam  After verbal consent patient was treated with HVLA, ME techniques in lumbar sacral and ilium areas  Patient tolerated the procedure well with improvement in symptoms  Patient given exercises, stretches and lifestyle modifications  See medications in patient instructions if given  Patient will follow up in 3 weeks due to exacerbation.

## 2015-03-20 NOTE — Progress Notes (Signed)
Pre visit review using our clinic review tool, if applicable. No additional management support is needed unless otherwise documented below in the visit note. 

## 2015-03-20 NOTE — Assessment & Plan Note (Signed)
Patient does have back pain and sacroiliac dysfunction that I think is contribute in. Patient does have some mild scapular dyskinesia that is also contributing but this caused a slipped rib syndrome. We discussed posturing trying to take more and drainage of taking care of herself. We discussed icing regimen. We discussed home exercises and doing the more postural training. Patient is gone come back again in 3 weeks. Prescription for anti-inflammatory given and will take daily for 10 days then as needed.

## 2015-03-20 NOTE — Progress Notes (Signed)
[name] [place], [place] [address] Phone: [phone] Subjective:     CC: Low back pain follow up Upper back pain  [id number] [name] is a 40 y.o. female coming in with complaint of low back pain. Patient did have an MRI back in 2013 that showed a very small L5-S1 central protrusion but no significant nerve root impingement. Patient states she is given an epidural steroid injection but we did not have any significant improvement.   Patient has been doing very well with treatment for more of the sacroiliac joint dysfunction. Has responded well to osteopathic manipulation.Patient was having worsening exacerbation of low back pain. Has not been as active. Does think some of it is associated with stress. Has been working more frequently as well without doing the exercises or taking the vitamins on a regular regimen.   Patient states though that she is having worsening right upper back pain. Seems to be different than her usual discomfort. Describes the pain as dull throbbing aching sensation. Patient notices it initially when she was reaching above her head. States that now it was catching her breath from time to time. Denies any swelling, any pain around the shoulder itself. Seems to be more around the rim. Getting better very slowly but still giving her discomfort. Stated difficult to work this week.    Past Medical History  Diagnosis Date  . Allergy   . Hypertension    Past Surgical History  Procedure Laterality Date  . Tubal ligation    . Cholecystectomy     Social History  Substance Use Topics  . Smoking status: Never Smoker   . Smokeless tobacco: None  . Alcohol Use: Yes   Family History  Problem Relation Age of Onset  . Cancer Mother     Breast Cancer  . Hypertension Father   . Hyperlipidemia Father   . Stroke Maternal Grandmother   . Alcohol abuse Paternal Grandmother   . Heart disease Paternal Grandfather   .  Other Brother     lyme   Allergies  Allergen Reactions  . Lamisil [Terbinafine Hcl] Hives and Itching  . Terbinafine Hcl Hives        Past medical history, social, surgical and family history all reviewed in electronic medical record.   Review of Systems: No headache, visual changes, nausea, vomiting, diarrhea, constipation, dizziness, abdominal pain, skin rash, fevers, chills, night sweats, weight loss, swollen lymph nodes, body aches, joint swelling, muscle aches, chest pain, shortness of breath, mood changes.   Objective Blood pressure 124/74, pulse 78, height 5' 3"  (1.6 m), weight 137 lb (62.143 kg), SpO2 97 %.  General: No apparent distress alert and oriented x3 mood and affect normal, dressed appropriately.  HEENT: Pupils equal, extraocular movements intact  Respiratory: Patient's speak in full sentences and does not appear short of breath  Cardiovascular: No lower extremity edema, non tender, no erythema  Skin: Warm dry intact with no signs of infection or rash on extremities or on axial skeleton.  Abdomen: Soft nontender  Neuro: Cranial nerves II through XII are intact, neurovascularly intact in all extremities with 2+ DTRs and 2+ pulses.  Lymph: No lymphadenopathy of posterior or anterior cervical chain or axillae bilaterally.  Gait normal with good balance and coordination.  MSK:  Non tender with full range of motion and good stability and symmetric strength and tone of shoulders, elbows, wrist, hip, knee and ankles bilaterally.  Back Exam:  Inspection: Unremarkable  Motion: Flexion 35 deg, Extension 25 deg, Side Bending to 45 deg bilaterally, no change in range of motion Rotation to 45 deg bilaterally  SLR laying: Negative  XSLR laying: Negative  Palpable tenderness: Continued improvement in tenderness. In the lumbar region but also now in the paraspinal muscular tear of the thoracic region around the scapula on the right side FABER: Positive still on the left side but  mild. Sensory change: Gross sensation intact to all lumbar and sacral dermatomes.  Mild scapular dyskinesia noted on the right side Reflexes: 2+ at both patellar tendons, 2+ at achilles tendons, Babinski's downgoing.  Strength at foot  Plantar-flexion: 5/5 Dorsi-flexion: 5/5 Eversion: 5/5 Inversion: 5/5  Leg strength  Quad: 5/5 Hamstring: 5/5 Hip flexor: 5/5 Hip abductors: 4/5 on left with full strength on right Gait unremarkable.  Osteopathic findings Cervical C2 flexed rotated and side bent right C5 flexed rotated and side bent left Thoracic T1 extended rotated and side bent left with inhaled first rib T5 extended rotated and side bent right with inhaled right rib  Lumbar L2 flexed rotated and side bent right Sacrum Right on right Ileum Neutral   Impression and Recommendations:     This case required medical decision making of moderate complexity.

## 2015-03-20 NOTE — Patient Instructions (Addendum)
Good to see you Ice would be good Can lay on a tennis ball and massage the area when you have time Try the opsture exercise a little  On wall with heels, butt shoulder and head touching for a goal of 5 minutes daily  Meloxicam daily for 10 days then as needed Just call PT at [phone] they will get you scheduled See me again in 3 weeks.

## 2015-04-05 ENCOUNTER — Ambulatory Visit: Payer: 59

## 2015-04-05 ENCOUNTER — Ambulatory Visit: Payer: 59 | Attending: Family Medicine | Admitting: Physical Therapy

## 2015-04-05 DIAGNOSIS — M25651 Stiffness of right hip, not elsewhere classified: Secondary | ICD-10-CM

## 2015-04-05 DIAGNOSIS — M25551 Pain in right hip: Secondary | ICD-10-CM | POA: Diagnosis not present

## 2015-04-05 DIAGNOSIS — R29898 Other symptoms and signs involving the musculoskeletal system: Secondary | ICD-10-CM | POA: Diagnosis not present

## 2015-04-05 DIAGNOSIS — R262 Difficulty in walking, not elsewhere classified: Secondary | ICD-10-CM

## 2015-04-05 NOTE — Therapy (Signed)
[facility] [address]  [place], [place], [address] Phone: [phone]   Fax:  [phone]  Physical Therapy Evaluation  Patient Details  Name: [name] MRN: [id number] Date of Birth: Jul 28, 1975 Referring Provider: Hulan Saas, MD  Encounter Date: 04/05/2015      PT End of Session - 04/05/15 0955    Visit Number 1   Number of Visits 12   Date for PT Re-Evaluation 05/17/15   PT Start Time 0849  Pt finishing admission paperwork   PT Stop Time 0934   PT Time Calculation (min) 45 min   Activity Tolerance Patient tolerated treatment well   Behavior During Therapy [facility] for tasks assessed/performed      Past Medical History  Diagnosis Date  . Allergy   . Hypertension     Past Surgical History  Procedure Laterality Date  . Tubal ligation    . Cholecystectomy      There were no vitals filed for this visit.  Visit Diagnosis:  Posterior pain of right hip  Stiffness of right hip joint  Weakness of right leg  Difficulty walking      Subjective Assessment - 04/05/15 0851    Subjective Patient reports onset of riht hip pain ~40 y/o ago without know triggering event. Pain initially presented with anterior groin pain with pain later in posterior SIJ and buttock. Has previously received PT for SIJ dysfunction but pain persisted in buttock at which point obturater internus muscle injury was suspected. Issues have led to muscle atrophy in buttocks and distal instability inlcuding progressive pes planus for which patient now wears orthotics.   Pertinent History PT 2-3 yrs ago for SIJ   Limitations Sitting;Walking   How long can you sit comfortably? 45 minutes   How long can you walk comfortably? 1 hour   Patient Stated Goals "decrease pain & build up muscle to improve stability"   Currently in Pain? 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, sports, cycling machines at gym   Pain Relieving Factors ice, anti-inflammatory   Effect of Pain on Daily Activities pain present at all times but typically able to do what she needs to            [facility] PT Assessment - 04/05/15 0849    Assessment   Medical Diagnosis Tendinopathy of right gluteal region   Referring Provider Hulan Saas, MD   Onset Date/Surgical Date --  4 yrs ago   Next MD Visit 04/10/15   Prior Therapy PT for SIJ dysfunction 2-3 yrs ago   Balance Screen   Has the patient fallen in the past 6 months No   Has the patient had a decrease in activity level because of a fear of falling?  No   Is the patient reluctant to leave their home because of a fear of falling?  No   Prior Function   Level of Independence Independent   Vocation Full time employment   Vocation Requirements L&D RN   Leisure  working out a coulple days per week, mom of 3 children, vacationing   Observation/Other Assessments   Focus on Therapeutic Outcomes (FOTO)  Hip 60% (40% limitation); Predicted 68% (32% limitation)   ROM / Strength   AROM / PROM / Strength Strength   Strength   Strength Assessment Site Hip;Knee   Right/Left Hip Right;Left   Right Hip Flexion 4-/5   Right Hip Extension 4-/5   Right Hip ABduction 4-/5   Right Hip ADduction 4-/5   Left Hip Flexion 4-/5   Left Hip Extension 4-/5   Left Hip ABduction 4/5   Left Hip ADduction 4/5   Right/Left Knee Right;Left   Right Knee Flexion 4-/5   Right Knee Extension 4/5   Left Knee Flexion 4+/5   Left Knee Extension 4+/5   Flexibility   Soft Tissue Assessment /Muscle Length yes   Hamstrings  bilateral tightness R > L   Quadriceps tight on R   ITB WFL   Piriformis tight bilaterally R > L   Palpation   Palpation comment ttp along SIJ border, piriformis and obturator   Special Tests    Special Tests Hip Special Tests   Hip Special Tests  Saralyn Pilar (FABER) Test;Trendelenberg Test;Thomas Test;Ober's Test   Saralyn Pilar [facility]) Test   Findings Positive   Side Right   Trendelenburg Test   Findings Negative   Thomas Test    Findings Positive   Side Right;Left   Ober's Test   Findings Negative        Today's Treatment  Verbal review of prior HEP - stretches for hamstrings, hip flexors and piriformis; bridging; wall sits; abdominal bracing with proximal LE strengthening  TherEx HEP instruction:   Bridging with B Hip Abd isometric with green TB 10x3"   Hooklying alternating Hip Abd/ER with green TB 10x3"   R Hip Abd/ER clam in L sidelying with green TB 10x3"   R Rectus femoris stretch in lunge position with R knee on chair seat (demonstrated but not attempted)   Doorway standing hip flexor stretch   ITB stretch in standing           PT Education - 04/05/15 0954    Education provided Yes   Education Details PT eval findings, POC, existing HEP review + update/additions   Person(s) Educated Patient   Methods Explanation;Demonstration;Handout   Comprehension Verbalized understanding;Returned demonstration;Need further instruction             PT Long Term Goals - 04/05/15 1400    PT LONG TERM GOAL #1   Title Pt will be independent with HEP/gym program by 05/17/15   Status New   PT LONG TERM GOAL #2   Title Pt will demonstrate right hip strength 4+/5 or greater by 05/17/15   Status New   PT LONG TERM GOAL #3   Title Pt will complete household chores and tolerate 12 hr work shift with pain no greater than 4/10 by 05/17/15   Status New               Plan - 04/05/15 1305    Clinical Impression Statement Patient is a 40 y/o female who presents to OP PT for  right posterior hip pain and weakness of ~4 years duration associated with presumed obturater internus muscle injury. Patient has previously received therapy for SIJ pain and continues with limited compliance with HEP. Pain currently deep in right buttock but extends into lateral hip and groin as well as down anteriolateral thigh to just above  knee when more intense. Patient demonstrates limited flexibility in proximal LE muscles, especially hamstrings, hip external rotators and quads, right > left. Also demonstrates weakness in hip & thigh musclature averaging 4-/5 on left. 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    Pt will benefit from skilled therapeutic intervention in order to improve on the following deficits Pain;Impaired flexibility;Decreased range of motion;Hypomobility;Increased muscle spasms;Decreased strength;Difficulty walking;Abnormal gait   Rehab Potential Good   Clinical Impairments Affecting Rehab Potential Chronicity of pain   PT Frequency 2x / week   PT Duration 6 weeks   PT Treatment/Interventions Patient/family education;Manual techniques;Taping;Dry needling;Therapeutic exercise;Ultrasound;Moist Heat;Electrical Stimulation;Cryotherapy;Iontophoresis 21m/ml Dexamethasone;Balance training;Therapeutic activities   PT Next Visit Plan Review prior and new HEP - update as appropriate: Hip/proximal LE flexibillity and strengthening: Manual therapy for STM/DTM, Taping & Modalities PRN   Consulted and Agree with Plan of Care Patient         Problem List Patient Active Problem List   Diagnosis Date Noted  . 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 Scapular dysfunction 03/20/2015  . 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 Nonallopathic lesion of lumbosacral region 06/29/2014  . Nonallopathic lesion of sacral region 06/29/2014  . Nonallopathic lesion  of pelvic region 06/29/2014  . Recurrent UTI 05/18/2013  . Back pain, acute 10/07/2010  . Allergic rhinitis 08/14/2010  . Essential hypertension, benign 01/22/2010    JPercival Spanish PT, MPT 04/05/2015, 2:09 PM  [facility] [place] [address]Phone: [phone]  Fax:  [phone] Name: Lorraine YOWMRN: [id number] of Birth: 3Jul 29, 1977

## 2015-04-09 ENCOUNTER — Ambulatory Visit: Payer: 59 | Admitting: Family Medicine

## 2015-04-10 ENCOUNTER — Ambulatory Visit (INDEPENDENT_AMBULATORY_CARE_PROVIDER_SITE_OTHER): Payer: 59 | Admitting: Family Medicine

## 2015-04-10 ENCOUNTER — Encounter: Payer: Self-pay | Admitting: Family Medicine

## 2015-04-10 ENCOUNTER — Ambulatory Visit: Payer: 59 | Attending: Family Medicine | Admitting: Physical Therapy

## 2015-04-10 VITALS — BP 118/76 | HR 99 | Wt 139.0 lb

## 2015-04-10 DIAGNOSIS — M25651 Stiffness of right hip, not elsewhere classified: Secondary | ICD-10-CM | POA: Insufficient documentation

## 2015-04-10 DIAGNOSIS — M9903 Segmental and somatic dysfunction of lumbar region: Secondary | ICD-10-CM | POA: Diagnosis not present

## 2015-04-10 DIAGNOSIS — R29898 Other symptoms and signs involving the musculoskeletal system: Secondary | ICD-10-CM | POA: Diagnosis not present

## 2015-04-10 DIAGNOSIS — M9902 Segmental and somatic dysfunction of thoracic region: Secondary | ICD-10-CM

## 2015-04-10 DIAGNOSIS — R262 Difficulty in walking, not elsewhere classified: Secondary | ICD-10-CM | POA: Insufficient documentation

## 2015-04-10 DIAGNOSIS — M9904 Segmental and somatic dysfunction of sacral region: Secondary | ICD-10-CM | POA: Diagnosis not present

## 2015-04-10 DIAGNOSIS — M25551 Pain in right hip: Secondary | ICD-10-CM | POA: Diagnosis not present

## 2015-04-10 DIAGNOSIS — M549 Dorsalgia, unspecified: Secondary | ICD-10-CM

## 2015-04-10 DIAGNOSIS — M999 Biomechanical lesion, unspecified: Secondary | ICD-10-CM

## 2015-04-10 NOTE — Patient Instructions (Signed)
Good to see you  Ice 20 minutes 2 times daily. Usually after activity and before bed. Continue with the exercises Continue the tylenol and the meloxicam  Can try pennsaid twice daily as needed as well.  See me again in 2-3 weeks or call or write me if pain  Is worsening and we need to try injection or prednisone.

## 2015-04-10 NOTE — Assessment & Plan Note (Signed)
Decision today to treat with OMT was based on Physical Exam  After verbal consent patient was treated with HVLA, ME techniques in lumbar sacral and ilium areas  Patient tolerated the procedure well with improvement in symptoms  Patient given exercises, stretches and lifestyle modifications  See medications in patient instructions if given  Patient will follow up in 3 weeks due to exacerbation.

## 2015-04-10 NOTE — Progress Notes (Signed)
[name], [name] Phone: [phone] Subjective:     CC: Low back pain follow up Upper back pain  [id number] [name] is a 40 y.o. female coming in with complaint of low back pain. Patient did have an MRI back in 2013 that showed a very small L5-S1 central protrusion but no significant nerve root impingement. Patient states she is given an epidural steroid injection but we did not have any significant improvement.   Patient had been responding very well to osteopathic manipulation. Patient states though that she was doing some repetitive activity insert having increasing back pain again. Discuss it as dull, throbbing aching sensation. Patient states it seems to be worse than her regular flares. Some minimal radiation down the leg. No weakness though. States that it is even difficult to do some daily activities. Has been working with it. Feels that she is out of line.  Past Medical History  Diagnosis Date  . Allergy   . Hypertension    Past Surgical History  Procedure Laterality Date  . Tubal ligation    . Cholecystectomy     Social History  Substance Use Topics  . Smoking status: Never Smoker   . Smokeless tobacco: None  . Alcohol Use: Yes   Family History  Problem Relation Age of Onset  . Cancer Mother     Breast Cancer  . Hypertension Father   . Hyperlipidemia Father   . Stroke Maternal Grandmother   . Alcohol abuse Paternal Grandmother   . Heart disease Paternal Grandfather   . Other Brother     lyme   Allergies  Allergen Reactions  . Lamisil [Terbinafine Hcl] Hives and Itching  . Terbinafine Hcl Hives       Past medical history, social, surgical and family history all reviewed in electronic medical record.   Review of Systems: No headache, visual changes, nausea, vomiting, diarrhea, constipation, dizziness, abdominal pain, skin rash, fevers, chills, night sweats, weight loss, swollen  lymph nodes, body aches, joint swelling, muscle aches, chest pain, shortness of breath, mood changes.   Objective Blood pressure 118/76, pulse 99, weight 139 lb (63.05 kg), SpO2 99 %.  General: No apparent distress alert and oriented x3 mood and affect normal, dressed appropriately.  HEENT: Pupils equal, extraocular movements intact  Respiratory: Patient's speak in full sentences and does not appear short of breath  Cardiovascular: No lower extremity edema, non tender, no erythema  Skin: Warm dry intact with no signs of infection or rash on extremities or on axial skeleton.  Abdomen: Soft nontender  Neuro: Cranial nerves II through XII are intact, neurovascularly intact in all extremities with 2+ DTRs and 2+ pulses.  Lymph: No lymphadenopathy of posterior or anterior cervical chain or axillae bilaterally.  Gait normal with good balance and coordination.  MSK:  Non tender with full range of motion and good stability and symmetric strength and tone of shoulders, elbows, wrist, hip, knee and ankles bilaterally.  Back Exam:  Inspection: Unremarkable  Motion: Flexion 35 deg patient seems more uncomfortable with flexion than usual, Extension 25 deg, Side Bending to 45 deg bilaterally, no change in range of motion Rotation to 45 deg bilaterally  SLR laying: Negative  XSLR laying: Negative  Palpable tenderness: Worsening symptoms with the paraspinal musculature of the lumbar spine to palpation. FABER: Positive bilateral Sensory change: Gross sensation intact to all lumbar and sacral dermatomes.  Mild scapular dyskinesia noted on the right side  Reflexes: 2+ at both patellar tendons, 2+ at achilles tendons, Babinski's downgoing.  Strength at foot  Plantar-flexion: 5/5 Dorsi-flexion: 5/5 Eversion: 5/5 Inversion: 5/5  Leg strength  Quad: 5/5 Hamstring: 5/5 Hip flexor: 5/5 Hip abductors: 4/5 on left with full strength on right Gait unremarkable.  Osteopathic findings Cervical C2 flexed rotated  and side bent right C6 flexed rotated and side bent left Thoracic T1 extended rotated and side bent left with inhaled first rib T5 extended rotated and side bent right with inhaled right rib  Lumbar L2 flexed rotated and side bent right L4 flexed rotated and side bent left Sacrum Right on right Ileum Neutral   Impression and Recommendations:     This case required medical decision making of moderate complexity.

## 2015-04-10 NOTE — Therapy (Signed)
[facility] [address]  [place], [place], [address] Phone: [phone]   Fax:  [phone]  Physical Therapy Treatment  Patient Details  Name: [name] MRN: [id number] Date of Birth: 08-Nov-1975 Referring Provider: Hulan Saas, MD  Encounter Date: 04/10/2015      PT End of Session - 04/10/15 0850    Visit Number 2   Number of Visits 12   Date for PT Re-Evaluation 05/17/15   PT Start Time 0847   PT Stop Time 0929   PT Time Calculation (min) 42 min   Activity Tolerance Patient tolerated treatment well   Behavior During Therapy [facility] for tasks assessed/performed      Past Medical History  Diagnosis Date  . Allergy   . Hypertension     Past Surgical History  Procedure Laterality Date  . Tubal ligation    . Cholecystectomy      There were no vitals filed for this visit.  Visit Diagnosis:  Posterior pain of right hip  Stiffness of right hip joint  Weakness of right leg  Difficulty walking      Subjective Assessment - 04/10/15 0849    Currently in Pain? Yes   Pain Score 4    Pain Location Buttocks   Pain Orientation Right;Proximal           Today's Treatment  TherEx NuStep - lvl 5 x 5' Bridging with B Hip Abd isometric with green TB 10x3" Hooklying alternating Hip Abd/ER with green TB 10x3" B Hip Abd/ER clam in L sidelying with green TB 10x3" R thomas hip flexor stretch 3x20" LTR - slow rotation x10, followed by 2x20" stretch B DKTC stretch 2x20" Hooklying abdominal bracing x10 Hooklying upper abdominal curl 2x10 Manual R ITB stretch in L side-lying x20" R ITB stretch in standing 2x20" R Hip flexor & Rectus femoris stretches in lunge position with R knee on chair seat x20" each R Hip flexor stretch in 1/2 kneel lunge position x20" R Hip adductor stretch in 1/2 kneel lateral lunge position x20" TrA + Hooklying march 1# 2x10 Bridge with feet on peanut ball 2x10 (more  comfortable than standard bridge)           PT Long Term Goals - 04/10/15 0927    PT LONG TERM GOAL #1   Title Pt will be independent with HEP/gym program by 05/17/15   Status On-going   PT LONG TERM GOAL #2   Title Pt will demonstrate right hip strength 4+/5 or greater by 05/17/15   Status On-going   PT LONG TERM GOAL #3   Title Pt will complete household chores and tolerate 12 hr work shift with pain no greater than 4/10 by 05/17/15   Status On-going               Plan - 04/10/15 1003    Clinical Impression Statement Patient reporting ggod compliance with HEP and able to demonstrate HEP exercises with minor corrections/clarifications. Tolerated initial progression of exercises with no increased pain but significant effort required to complete exercises.    PT Next Visit Plan Hip/proximal LE flexibillity and strengthening: Manual therapy for STM/DTM, Taping & Modalities PRN   Consulted and Agree with Plan of Care Patient        Problem List Patient Active Problem List   Diagnosis Date Noted  . 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 Scapular dysfunction 03/20/2015  . 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 Tendinopathy of  right gluteal region 06/29/2014  . Nonallopathic lesion of lumbosacral region 06/29/2014  . Nonallopathic lesion of sacral region 06/29/2014  . Nonallopathic lesion of pelvic region 06/29/2014  . Recurrent UTI 05/18/2013  . Back pain, acute 10/07/2010  . Allergic rhinitis 08/14/2010  . Essential hypertension, benign 01/22/2010    Percival Spanish, PT, MPT  04/10/2015, 10:18 AM  [facility] [address]  [place], [place], [address] Phone: [phone]   Fax:  [phone]  Name: [name] MRN: [id number] Date of Birth: 1975/05/08

## 2015-04-10 NOTE — Assessment & Plan Note (Signed)
Seems to be more of a muscle strain than true sacroiliac joint dysfunction. We discussed with patient at great length. Given perception were topical anti-inflammatory. We discussed icing regimen. We discussed which activities to do an which was potentially avoid. Did respond somewhat to osteopathic manipulation. Did have some rotation of the sacroiliac joint or could've been contributing but I do not think it is the primary factor today. Patient will follow-up again in 2-3 weeks for further evaluation and treatment call sooner if radicular symptoms seem to get worse.

## 2015-04-11 ENCOUNTER — Ambulatory Visit: Payer: 59

## 2015-04-11 DIAGNOSIS — M25551 Pain in right hip: Secondary | ICD-10-CM

## 2015-04-11 DIAGNOSIS — R262 Difficulty in walking, not elsewhere classified: Secondary | ICD-10-CM | POA: Diagnosis not present

## 2015-04-11 DIAGNOSIS — R29898 Other symptoms and signs involving the musculoskeletal system: Secondary | ICD-10-CM | POA: Diagnosis not present

## 2015-04-11 DIAGNOSIS — M25651 Stiffness of right hip, not elsewhere classified: Secondary | ICD-10-CM

## 2015-04-11 NOTE — Therapy (Addendum)
[facility] [address]  [place], [place], [address] Phone: [phone]   Fax:  [phone]  Physical Therapy Treatment  Patient Details  Name: [name] MRN: [id number] Date of Birth: 1975-04-24 Referring Provider: Hulan Saas, MD  Encounter Date: 04/11/2015      PT End of Session - 04/11/15 0858    Visit Number 3   Number of Visits 12   Date for PT Re-Evaluation 05/17/15   PT Start Time 0803   PT Stop Time 0845   PT Time Calculation (min) 42 min   Activity Tolerance Patient tolerated treatment well   Behavior During Therapy [facility] for tasks assessed/performed      Past Medical History  Diagnosis Date  . Allergy   . Hypertension     Past Surgical History  Procedure Laterality Date  . Tubal ligation    . Cholecystectomy      There were no vitals filed for this visit.  Visit Diagnosis:  Posterior pain of right hip  Stiffness of right hip joint  Weakness of right leg      Subjective Assessment - 04/11/15 0805    Subjective Pt. reports, "I saw a doctor yesterday and he determined no disk involvement, just SI irritation.".    Limitations --  prolonged sitting   Patient Stated Goals "decrease pain & build up muscle to improve stability"   Currently in Pain? Yes   Pain Score 3    Pain Location Buttocks   Pain Orientation Right;Proximal   Pain Descriptors / Indicators Burning   Pain Type Chronic pain   Aggravating Factors  driving   Multiple Pain Sites No      Today's Treatment  TherEx - NuStep - lvl 5 x 5' - Bridging with B Hip Abd isometric with green TB with heels on Peanut ball 10x3" - Hooklying alternating Hip Abd/ER with isometric hold on opposite leg TB 10x3" each  - B Hip Abd/ER clam in L sidelying with green TB 10x3" - R thomas hip flexor stretch 3x20" - LTR - slow rotation x10, followed by 2x30" stretch B - DKTC self stretch 2x30" - Hooklying abdominal bracing x10 with  marching 1 # - Hooklying upper abdominal curl 2x10 - Manual R ITB stretch in L side-lying x20" - R ITB stretch in standing 2x30" - R Hip flexor & Rectus femoris stretches in lunge position with R knee on chair seat 2 x30" - R Hip flexor stretch in 1/2 kneel lunge position 2 x 30" - TrA + Hooklying march in supine with 2# cuff weights on ankles 2x10 - Bridge with feet on peanut ball 2x10 (more comfortable than standard bridge) - side stepping 20' in partial squat with green TB around ankles; pt. tolerated well however reported "feeling the burn on B lateral hips" - Monster walk forward with green TB around ankles 20'; pt. again described "feeling the burn on anteriolateral B hip"         PT Long Term Goals - 04/10/15 0927    PT LONG TERM GOAL #1   Title Pt will be independent with HEP/gym program by 05/17/15   Status On-going   PT LONG TERM GOAL #2   Title Pt will demonstrate right hip strength 4+/5 or greater by 05/17/15   Status On-going   PT LONG TERM GOAL #3   Title Pt will complete household chores and tolerate 12 hr work shift with pain no greater than 4/10 by 05/17/15  Status On-going               Plan - 04/11/15 0900    Clinical Impression Statement Pt. progressed from 1# to #2 cuff weight with alternating lower abdominal marching; pt. tolerated progression well showing only minimal fatigue.  Side-stepping and monster walk 20' added today for B hip strengthening; pt. tolerated addition well however reported "feeling muscular weakness on B lateral hips following activity.   Pt. continues to progress toward goals and reports compliance with HEP.     PT Next Visit Plan Hip/proximal LE flexibillity and strengthening: Manual therapy for STM/DTM, Taping & Modalities PRN.  Continue to progress distance with side stepping with green TB.   Consulted and Agree with Plan of Care Patient        Problem List Patient Active Problem List   Diagnosis Date Noted  . SI (sacroiliac)  joint dysfunction 03/20/2015  . Scapular dysfunction 03/20/2015  . 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 Nonallopathic lesion of lumbosacral region 06/29/2014  . Nonallopathic lesion of sacral region 06/29/2014  . Nonallopathic lesion of pelvic region 06/29/2014  . Recurrent UTI 05/18/2013  . Back pain, acute 10/07/2010  . Allergic rhinitis 08/14/2010  . Essential hypertension, benign 01/22/2010    Bess Harvest, PTA 04/12/2015, 9:21 AM  [facility] [address]  [place], [place], [address] Phone: [phone]   Fax:  [phone]  Name: AULANI SHIPTON MRN: [id number] Date of Birth: 03-03-1976

## 2015-04-11 NOTE — Patient Instructions (Signed)
Pt. Instructed to add side-stepping in partial squat with green T around ankles to HEP however to initially only side-step for short distances 20' once a day.  Pt. verbalized and returned demonstration in session (see interventions).

## 2015-04-15 ENCOUNTER — Ambulatory Visit: Payer: 59

## 2015-04-15 DIAGNOSIS — M25551 Pain in right hip: Secondary | ICD-10-CM | POA: Diagnosis not present

## 2015-04-15 DIAGNOSIS — R29898 Other symptoms and signs involving the musculoskeletal system: Secondary | ICD-10-CM

## 2015-04-15 DIAGNOSIS — R262 Difficulty in walking, not elsewhere classified: Secondary | ICD-10-CM | POA: Diagnosis not present

## 2015-04-15 DIAGNOSIS — M25651 Stiffness of right hip, not elsewhere classified: Secondary | ICD-10-CM | POA: Diagnosis not present

## 2015-04-15 NOTE — Therapy (Signed)
[facility] [address]  [place], [place], [address] Phone: [phone]   Fax:  [phone]  Physical Therapy Treatment  Patient Details  Name: FAWNE HUGHLEY MRN: [id number] Date of Birth: [birth year] Referring Provider: Hulan Saas, MD  Encounter Date: 04/15/2015      PT End of Session - 04/15/15 0758    Visit Number 4   Number of Visits 12   Date for PT Re-Evaluation 05/17/15   PT Start Time 0758   Activity Tolerance Patient tolerated treatment well   Behavior During Therapy [facility] for tasks assessed/performed      Past Medical History  Diagnosis Date  . Allergy   . Hypertension     Past Surgical History  Procedure Laterality Date  . Tubal ligation    . Cholecystectomy      There were no vitals filed for this visit.  Visit Diagnosis:  Posterior pain of right hip  Stiffness of right hip joint  Weakness of right leg      Subjective Assessment - 04/15/15 0800    Subjective Pt. reports a 3/10 pain at the R hip, however no new complaints reported.    Patient Stated Goals "decrease pain & build up muscle to improve stability"   Currently in Pain? Yes   Pain Score 3    Pain Location Buttocks   Pain Orientation Right   Pain Descriptors / Indicators Burning   Pain Type Chronic pain   Pain Onset More than a month ago   Pain Frequency Constant   Aggravating Factors  driving    Pain Relieving Factors ice, anti-inflammatory.   Multiple Pain Sites No      Today's Treatment  TherEx - NuStep - lvl 5 x 5.5' - Bridge with feet on peanut ball 2x10 (more comfortable than standard bridge) - Bridging with B Hip Abd isometric with green TB with heels on Peanut ball 10x3" - Hooklying alternating Hip Abd/ER with isometric hold on opposite leg TB 10x3" each  - B Hip Abd/ER clam in L sidelying with green TB 15 x 3" - R thomas hip flexor stretch 3x20" - LTR - slow rotation x10, followed by 2x30" stretch B -  DKTC self stretch 2x30" - Hooklying abdominal bracing with alternating marching 4 # x 10 each leg. - Hooklying upper abdominal curl 2x10 - Manual R ITB stretch in L side-lying x20" - R ITB stretch in standing 2x30" - Hook-lying double knee raise with big red TB looped around ankles 2 x 10 reps; therapist anchored red TB. - side-stepping 2 x 20' in partial squat with blue TB around ankles - Monster walk forward with blue TB around ankles 3 x 20'; pt. again described "feeling the burn on anteriolateral B hip"         PT Long Term Goals - 04/10/15 0927    PT LONG TERM GOAL #1   Title Pt will be independent with HEP/gym program by 05/17/15   Status On-going   PT LONG TERM GOAL #2   Title Pt will demonstrate right hip strength 4+/5 or greater by 05/17/15   Status On-going   PT LONG TERM GOAL #3   Title Pt will complete household chores and tolerate 12 hr work shift with pain no greater than 4/10 by 05/17/15   Status On-going               Plan - 04/15/15 0814    Clinical Impression Statement Pt. tolerated  increased TB resistance from green to blue theraband with all therex well today.  Pt. continues to report 3/10 pain R buttocks, however pain remains unchanged during session.  Pt. continues to demo increased R hip strength and is progressing toward goals.     PT Next Visit Plan Hip/proximal LE flexibillity and strengthening: Manual therapy for STM/DTM, Taping & Modalities PRN.  Continue to progress distance with side stepping / monster walk with blue TB.   Consulted and Agree with Plan of Care Patient        Problem List Patient Active Problem List   Diagnosis Date Noted  . 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 Scapular dysfunction 03/20/2015  . 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 Nonallopathic lesion of lumbosacral region 06/29/2014  . Nonallopathic lesion of sacral region 06/29/2014  . Nonallopathic lesion of  pelvic region 06/29/2014  . Recurrent UTI 05/18/2013  . Back pain, acute 10/07/2010  . Allergic rhinitis 08/14/2010  . Essential hypertension, benign 01/22/2010    Bess Harvest, PTA  04/15/2015, 9:04 AM  [facility] [address]  [place], [place], [address] Phone: [phone]   Fax:  [phone]  Name: LUCIE FRIEDLANDER MRN: [id number] Date of Birth: [birth year]

## 2015-04-17 ENCOUNTER — Ambulatory Visit: Payer: 59

## 2015-04-19 ENCOUNTER — Ambulatory Visit: Payer: 59

## 2015-04-19 DIAGNOSIS — R29898 Other symptoms and signs involving the musculoskeletal system: Secondary | ICD-10-CM | POA: Diagnosis not present

## 2015-04-19 DIAGNOSIS — M25651 Stiffness of right hip, not elsewhere classified: Secondary | ICD-10-CM | POA: Diagnosis not present

## 2015-04-19 DIAGNOSIS — M25551 Pain in right hip: Secondary | ICD-10-CM

## 2015-04-19 DIAGNOSIS — R262 Difficulty in walking, not elsewhere classified: Secondary | ICD-10-CM

## 2015-04-19 NOTE — Therapy (Signed)
[facility] [address]  [place], [place], [address] Phone: [phone]   Fax:  [phone]  Physical Therapy Treatment  Patient Details  Name: [name] MRN: [id number] Date of Birth: 05-Dec-1975 Referring Provider: Hulan Saas, MD  Encounter Date: 04/19/2015      PT End of Session - 04/19/15 1105    Visit Number 5   Number of Visits 12   Date for PT Re-Evaluation 05/17/15   PT Start Time 1102   PT Stop Time 1146   PT Time Calculation (min) 44 min   Activity Tolerance Patient tolerated treatment well   Behavior During Therapy [facility] for tasks assessed/performed      Past Medical History  Diagnosis Date  . Allergy   . Hypertension     Past Surgical History  Procedure Laterality Date  . Tubal ligation    . Cholecystectomy      There were no vitals filed for this visit.  Visit Diagnosis:  Posterior pain of right hip  Stiffness of right hip joint  Weakness of right leg  Difficulty walking      Subjective Assessment - 04/19/15 1107    Subjective Pt. reports 5/10 pain at the R posterior hip.     Currently in Pain? Yes   Pain Score 5    Pain Location Hip   Pain Orientation Right;Posterior   Pain Descriptors / Indicators Burning   Pain Type Chronic pain   Pain Radiating Towards n/a   Pain Onset More than a month ago   Pain Frequency Constant   Aggravating Factors  lots of walking, driving, R side sleeping   Pain Relieving Factors ice, anti-inflammatory   Multiple Pain Sites No        Today's Treatment  TherEx - Elliptical level 1.5 x 5.5' - Bridging with alternating SLR x 10 reps  - Bridge with feet on peanut ball 2x10 (more comfortable than standard bridge) - Hooklying upper abdominal curl 2x10 - Manual R ITB stretch in L side-lying x 30" - R HS stretch 2 x 30 sec  - Hook-lying double knee raise with big red TB looped around ankles 2 x 10 reps; therapist anchored red TB. -  Eccentric lowering on 4" step with heel touch 2 x 10 each side - Alternating forward step up on 8" step side  - Alternating side stepping onto 8" step x 10 each way        PT Long Term Goals - 04/10/15 0927    PT LONG TERM GOAL #1   Title Pt will be independent with HEP/gym program by 05/17/15   Status On-going   PT LONG TERM GOAL #2   Title Pt will demonstrate right hip strength 4+/5 or greater by 05/17/15   Status On-going   PT LONG TERM GOAL #3   Title Pt will complete household chores and tolerate 12 hr work shift with pain no greater than 4/10 by 05/17/15   Status On-going               Plan - 04/19/15 1157    Clinical Impression Statement Pt. tolerated forward and side step-up activities reporting only minimal fatigue and a decrease in pain following therex.  Pt. came into therapy today with 5/10 R hip pain, leaving therapy with 3/10 pain.  side stepping with red TB around ankles 2 x 62f added to HEP, with red TB issued to pt. Pt. continues to increase B hip /  LE strength and is progressing toward goals.     PT Next Visit Plan Hip/proximal LE flexibillity and strengthening: Manual therapy for STM/DTM, Taping & Modalities PRN.  Continue to progress distance with side stepping / monster walk with blue TB.   Consulted and Agree with Plan of Care Patient        Problem List Patient Active Problem List   Diagnosis Date Noted  . 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 Scapular dysfunction 03/20/2015  . 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 Nonallopathic lesion of lumbosacral region 06/29/2014  . Nonallopathic lesion of sacral region 06/29/2014  . Nonallopathic lesion of pelvic region 06/29/2014  . Recurrent UTI 05/18/2013  . Back pain, acute 10/07/2010  . Allergic rhinitis 08/14/2010  . Essential hypertension, benign 01/22/2010    Bess Harvest, PTA 04/19/2015, 12:01 PM  [facility] [address]  [place], [place], [address] Phone: [phone]   Fax:  [phone]  Name: [name] MRN: [id number] Date of Birth: 27-Aug-1975

## 2015-04-23 ENCOUNTER — Ambulatory Visit: Payer: 59

## 2015-04-23 DIAGNOSIS — R29898 Other symptoms and signs involving the musculoskeletal system: Secondary | ICD-10-CM | POA: Diagnosis not present

## 2015-04-23 DIAGNOSIS — M25551 Pain in right hip: Secondary | ICD-10-CM | POA: Diagnosis not present

## 2015-04-23 DIAGNOSIS — M25651 Stiffness of right hip, not elsewhere classified: Secondary | ICD-10-CM | POA: Diagnosis not present

## 2015-04-23 DIAGNOSIS — R262 Difficulty in walking, not elsewhere classified: Secondary | ICD-10-CM | POA: Diagnosis not present

## 2015-04-23 NOTE — Therapy (Signed)
[facility] [address]  [place], [place], [address] Phone: [phone]   Fax:  [phone]  Physical Therapy Treatment  Patient Details  Name: [name] MRN: [id number] Date of Birth: [birth year] Referring Provider: Hulan Saas, MD  Encounter Date: 04/23/2015      PT End of Session - 04/23/15 1535    Visit Number 6   Number of Visits 12   Date for PT Re-Evaluation 05/17/15   PT Start Time 3500  started late due to pt. arriving to therapy late.   PT Stop Time 1618   PT Time Calculation (min) 42 min   Activity Tolerance Patient tolerated treatment well   Behavior During Therapy [facility] for tasks assessed/performed      Past Medical History  Diagnosis Date  . Allergy   . Hypertension     Past Surgical History  Procedure Laterality Date  . Tubal ligation    . Cholecystectomy      There were no vitals filed for this visit.  Visit Diagnosis:  Posterior pain of right hip  Stiffness of right hip joint  Weakness of right leg      Subjective Assessment - 04/23/15 1541    Subjective Pt. reports a 3/10 pain at the R posterior hip.  No other pain or complaints reported.     Patient Stated Goals "decrease pain & build up muscle to improve stability"   Currently in Pain? Yes   Pain Score 3    Pain Location Knee   Pain Orientation Right;Posterior   Pain Descriptors / Indicators Aching   Pain Type Chronic pain   Pain Radiating Towards n/a   Pain Onset More than a month ago   Pain Frequency Constant   Aggravating Factors  lots of walking, driving, R side sleeping   Pain Relieving Factors ice, anti-inflammatory   Multiple Pain Sites No      Today's Treatment  TherEx NuStep level 5, 4 min - Manual R ITB stretch in L side-lying x 30" - Bridging with alternating SLR x 8 each leg  - Bridge with feet on peanut ball 2x10 (more comfortable than standard bridge) - R HS stretch 2 x 30 sec  - Hook-lying  double knee raise with big red TB looped around ankles 2 x 10 reps; therapist anchored red TB. - functrional squat 4 x 10 reps with green band around knees - Side stepping with green band around ankles 2 x 20' - Eccentric lowering on 6" step with heel touch 2 x 10 each side - Alternating forward step up on 8" step x 10 each leg - Alternating cross-over side stepping on 8" - Leg curl machine x 10 reps  - Leg extension machine x 10 reps         PT Long Term Goals - 04/10/15 0927    PT LONG TERM GOAL #1   Title Pt will be independent with HEP/gym program by 05/17/15   Status On-going   PT LONG TERM GOAL #2   Title Pt will demonstrate right hip strength 4+/5 or greater by 05/17/15   Status On-going   PT LONG TERM GOAL #3   Title Pt will complete household chores and tolerate 12 hr work shift with pain no greater than 4/10 by 05/17/15   Status On-going         Problem List Patient Active Problem List   Diagnosis Date Noted  . 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  Scapular dysfunction 03/20/2015  . 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 Nonallopathic lesion of lumbosacral region 06/29/2014  . Nonallopathic lesion of sacral region 06/29/2014  . Nonallopathic lesion of pelvic region 06/29/2014  . Recurrent UTI 05/18/2013  . Back pain, acute 10/07/2010  . Allergic rhinitis 08/14/2010  . Essential hypertension, benign 01/22/2010    Bess Harvest, PTA 04/23/2015, 4:19 PM  [facility] [address]  [place], [place], [address] Phone: [phone]   Fax:  [phone]  Name: [name] MRN: [id number] Date of Birth: 1976/01/29

## 2015-04-24 ENCOUNTER — Ambulatory Visit: Payer: 59 | Admitting: Physical Therapy

## 2015-04-25 ENCOUNTER — Ambulatory Visit: Payer: 59 | Admitting: Physical Therapy

## 2015-04-25 ENCOUNTER — Ambulatory Visit (INDEPENDENT_AMBULATORY_CARE_PROVIDER_SITE_OTHER): Payer: 59 | Admitting: Family Medicine

## 2015-04-25 ENCOUNTER — Encounter: Payer: Self-pay | Admitting: Family Medicine

## 2015-04-25 ENCOUNTER — Ambulatory Visit (INDEPENDENT_AMBULATORY_CARE_PROVIDER_SITE_OTHER): Payer: 59

## 2015-04-25 VITALS — BP 130/82 | HR 93 | Ht 63.0 in | Wt 139.0 lb

## 2015-04-25 DIAGNOSIS — M25551 Pain in right hip: Secondary | ICD-10-CM

## 2015-04-25 DIAGNOSIS — R262 Difficulty in walking, not elsewhere classified: Secondary | ICD-10-CM

## 2015-04-25 DIAGNOSIS — M533 Sacrococcygeal disorders, not elsewhere classified: Secondary | ICD-10-CM | POA: Diagnosis not present

## 2015-04-25 DIAGNOSIS — M25651 Stiffness of right hip, not elsewhere classified: Secondary | ICD-10-CM

## 2015-04-25 DIAGNOSIS — R29898 Other symptoms and signs involving the musculoskeletal system: Secondary | ICD-10-CM

## 2015-04-25 MED ORDER — PREDNISONE 50 MG PO TABS: 50.0000 mg | ORAL_TABLET | Freq: Every day | ORAL | Status: DC

## 2015-04-25 MED ORDER — VENLAFAXINE HCL ER 37.5 MG PO CP24: 37.5000 mg | ORAL_CAPSULE | Freq: Every day | ORAL | Status: DC

## 2015-04-25 MED FILL — VENLAFAXINE HCL ER 37.5 MG: 37.5 | 30 days supply | Qty: 30 | Fill #0

## 2015-04-25 MED FILL — predniSONE 50 MG TABS: 50 | 5 days supply | Qty: 5 | Fill #0

## 2015-04-25 NOTE — Progress Notes (Signed)
[name] [place], [place] [address] Phone: [phone] Subjective:     CC: Low back pain follow up Upper back pain  ESP:[id number] KIARALIZ RAFUSE is a 40 y.o. female coming in with complaint of low back pain. Patient did have an MRI back in 2013 that showed a very small L5-S1 central protrusion but no significant nerve root impingement. Patient states she is given an epidural steroid injection but we did not have any significant improvement.   Patient had been responding very well to osteopathic manipulation. Patient was to do some different exercises and continue to work on core stabilization techniques. Patient was having some mild exacerbation of the sacroiliac joint follow-up. Patient states she is continuing to have pain. Seems to be very localized over the right SI joint. States that it's even hard to get out of a chair from a sitting position. Sometimes can wake her up at night. No radiation down the leg.  Past Medical History  Diagnosis Date  . Allergy   . Hypertension    Past Surgical History  Procedure Laterality Date  . Tubal ligation    . Cholecystectomy     Social History  Substance Use Topics  . Smoking status: Never Smoker   . Smokeless tobacco: None  . Alcohol Use: Yes   Family History  Problem Relation Age of Onset  . Cancer Mother     Breast Cancer  . Hypertension Father   . Hyperlipidemia Father   . Stroke Maternal Grandmother   . Alcohol abuse Paternal Grandmother   . Heart disease Paternal Grandfather   . Other Brother     lyme   Allergies  Allergen Reactions  . Lamisil [Terbinafine Hcl] Hives and Itching  . Terbinafine Hcl Hives       Past medical history, social, surgical and family history all reviewed in electronic medical record.   Review of Systems: No headache, visual changes, nausea, vomiting, diarrhea, constipation, dizziness, abdominal pain, skin rash, fevers, chills, night sweats, weight  loss, swollen lymph nodes, body aches, joint swelling, muscle aches, chest pain, shortness of breath, mood changes.   Objective Blood pressure 130/82, pulse 93, height 5' 3"  (1.6 m), weight 139 lb (63.05 kg), SpO2 99 %.  General: No apparent distress alert and oriented x3 mood and affect normal, dressed appropriately.  HEENT: Pupils equal, extraocular movements intact  Respiratory: Patient's speak in full sentences and does not appear short of breath  Cardiovascular: No lower extremity edema, non tender, no erythema  Skin: Warm dry intact with no signs of infection or rash on extremities or on axial skeleton.  Abdomen: Soft nontender  Neuro: Cranial nerves II through XII are intact, neurovascularly intact in all extremities with 2+ DTRs and 2+ pulses.  Lymph: No lymphadenopathy of posterior or anterior cervical chain or axillae bilaterally.  Gait normal with good balance and coordination.  MSK:  Non tender with full range of motion and good stability and symmetric strength and tone of shoulders, elbows, wrist, hip, knee and ankles bilaterally.  Back Exam:  Inspection: Unremarkable  Motion: Flexion 35 deg patient seems more uncomfortable with flexion than usual, Extension 25 deg, Side Bending to 45 deg bilaterally, no change in range of motion Rotation to 45 deg bilaterally  SLR laying: Negative  XSLR laying: Negative  Palpable tenderness: Severely tender over the right sacroiliac joint FABER: Positive mostly over the right side Sensory change: Gross sensation intact to all lumbar and sacral dermatomes.  Mild scapular dyskinesia noted on the right side Reflexes: 2+ at both patellar tendons, 2+ at achilles tendons, Babinski's downgoing.  Strength at foot  Plantar-flexion: 5/5 Dorsi-flexion: 5/5 Eversion: 5/5 Inversion: 5/5  Leg strength  Quad: 5/5 Hamstring: 5/5 Hip flexor: 5/5 Hip abductors: 4/5 on left with full strength on right Gait unremarkable.  Procedure: Real-time Ultrasound  Guided Injection of right sacroiliac joint Device: GE Logiq E  Ultrasound guided injection is preferred based studies that show increased duration, increased effect, greater accuracy, decreased procedural pain, increased response rate, and decreased cost with ultrasound guided versus blind injection.  Verbal informed consent obtained.  Time-out conducted.  Noted no overlying erythema, induration, or other signs of local infection.  Skin prepped in a sterile fashion.  Local anesthesia: Topical Ethyl chloride.  With sterile technique and under real time ultrasound guidance:  With a 21-gauge 2 inch needle patient was injected with a total of 1 mL of Kenalog 40 mg/dL and 1 mL of 0.5% Marcaine. Completed without difficulty  Pain immediately resolved suggesting accurate placement of the medication.  Advised to call if fevers/chills, erythema, induration, drainage, or persistent bleeding.  Images permanently stored and available for review in the ultrasound unit.  Impression: Technically successful ultrasound guided injection.  Impression and Recommendations:     This case required medical decision making of moderate complexity.

## 2015-04-25 NOTE — Therapy (Signed)
[facility] [address]  [place], [place], [address] Phone: [phone]   Fax:  [phone]  Physical Therapy Treatment  Patient Details  Name: [name] MRN: [id number] Date of Birth: [birth year] Referring Provider: Hulan Saas, MD  Encounter Date: 04/25/2015      PT End of Session - 04/25/15 1624    Visit Number 7   Number of Visits 12   Date for PT Re-Evaluation 05/17/15   PT Start Time 3335   PT Stop Time 1700   PT Time Calculation (min) 43 min   Activity Tolerance Patient tolerated treatment well   Behavior During Therapy [facility] for tasks assessed/performed      Past Medical History  Diagnosis Date  . Allergy   . Hypertension     Past Surgical History  Procedure Laterality Date  . Tubal ligation    . Cholecystectomy      There were no vitals filed for this visit.  Visit Diagnosis:  Posterior pain of right hip  Stiffness of right hip joint  Weakness of right leg  Difficulty walking      Subjective Assessment - 04/25/15 1620    Subjective Pt saw MD this morning and received an injection in the R SIJ. States MD requested focus of therapy today to be on flexibility rather than strengthening to allow time for injection to take effect.   Currently in Pain? Yes   Pain Score 3    Pain Location Hip   Pain Orientation Right;Anterior;Lateral            [facility] PT Assessment - 04/25/15 1617    Assessment   Next MD Visit 05/10/15         Today's Treatment  TherEx NuStep level 5 x 4 min  Manual B Hamstring stretch with contract/relax 2x30" B Supine ITB stretch 2x30" (pt instructed in how to use strap to create stretch at home) B Supine piriformis stretch 2x30" R ITB stretch in L side-lying 2x30" R QL stretch in L side-lying with contract/relax hip hiking Strumming and IASTM with roller to R ITB in L side-lying R RF stretch in L side-lying 2x30"  Kinesiotape to R Gluteus medius -  2 strips (30%) from posterior iliac crest to greater trochanter & lateral iliac crest to just distal to greater trochanter  TherEx Foam roller to R ITB x1' Foam roller to R piriformis x1'         PT Education - 04/25/15 1714    Education provided Yes   Education Details Supine ITB stretch with strap, foam roller stretch/STM for ITB and piriformis, role of kinesiotaping   Person(s) Educated Patient   Methods Explanation;Demonstration   Comprehension Verbalized understanding;Returned demonstration             PT Long Term Goals - 04/25/15 1713    PT LONG TERM GOAL #1   Title Pt will be independent with HEP/gym program by 05/17/15   Status On-going   PT LONG TERM GOAL #2   Title Pt will demonstrate right hip strength 4+/5 or greater by 05/17/15   Status On-going   PT LONG TERM GOAL #3   Title Pt will complete household chores and tolerate 12 hr work shift with pain no greater than 4/10 by 05/17/15   Status On-going               Plan - 04/25/15 1705    Clinical Impression Statement Patient continues to report low  intensity pain in R lateral hip in area of glut medius but also noting tenderness in QL and ITB. Focus of today's session on flexibility per MD request after R SIJ injection received earlier today with majority of interventions incorporating manual stretching and STM along with eductation in ways to perform stretches and self-STM with foam roller. Trial of kinesiotape to R glut medius to promote muscle relaxation. Will reassess at next visit and if  benefit noted, may also consider taping for ITB +/- SIJ.   PT Next Visit Plan Hip/proximal LE flexibillity and strengthening: Manual therapy for STM/DTM, Taping & Modalities PRN.     Consulted and Agree with Plan of Care Patient        Problem List Patient Active Problem List   Diagnosis Date Noted  . 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 Scapular dysfunction 03/20/2015  . 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 Nonallopathic lesion of lumbosacral region 06/29/2014  . Nonallopathic lesion of sacral region 06/29/2014  . Nonallopathic lesion of pelvic region 06/29/2014  . Recurrent UTI 05/18/2013  . Back pain, acute 10/07/2010  . Allergic rhinitis 08/14/2010  . Essential hypertension, benign 01/22/2010    Percival Spanish, PT, MPT 04/25/2015, 5:16 PM  [facility] [address]  [place], [place], [address] Phone: [phone]   Fax:  [phone]  Name: [name] MRN: [id number] Date of Birth: 1975/05/02

## 2015-04-25 NOTE — Progress Notes (Signed)
Pre visit review using our clinic review tool, if applicable. No additional management support is needed unless otherwise documented below in the visit note. 

## 2015-04-25 NOTE — Assessment & Plan Note (Signed)
Patient was given an injection today and tolerated the procedure. Well. We discussed icing regimen. We did give patient a prescription for Effexor to see if this will help with any type of nerve injury that could be contributed. In addition of this patient given prednisone if no significant improvement over the next 48 hours to start this medication and taking daily for 5 days. Patient will hold on physical therapy until Monday. Patient will come back and see me again in 2 weeks for further evaluation and treatment.

## 2015-04-25 NOTE — Patient Instructions (Addendum)
Good to see you  [name] is your friend [name] an injection today  Effexor daily  If not better on Saturday start the prednisone No exercises until Monday  See me again in 2 weeks to make sure you are doing well T

## 2015-04-29 ENCOUNTER — Ambulatory Visit: Payer: 59 | Admitting: Physical Therapy

## 2015-04-29 DIAGNOSIS — M25651 Stiffness of right hip, not elsewhere classified: Secondary | ICD-10-CM

## 2015-04-29 DIAGNOSIS — R262 Difficulty in walking, not elsewhere classified: Secondary | ICD-10-CM | POA: Diagnosis not present

## 2015-04-29 DIAGNOSIS — M25551 Pain in right hip: Secondary | ICD-10-CM | POA: Diagnosis not present

## 2015-04-29 DIAGNOSIS — R29898 Other symptoms and signs involving the musculoskeletal system: Secondary | ICD-10-CM

## 2015-04-29 NOTE — Therapy (Signed)
[facility] [address]  [place], [place], [address] Phone: [phone]   Fax:  [phone]  Physical Therapy Treatment  Patient Details  Name: DEANDREA VANPELT MRN: [id number] Date of Birth: [birth year] Referring Provider: Hulan Saas, MD  Encounter Date: 04/29/2015      PT End of Session - 04/29/15 1625    Visit Number 8   Number of Visits 12   Date for PT Re-Evaluation 05/17/15   PT Start Time 6962  Pt arrived late   PT Stop Time 1659   PT Time Calculation (min) 38 min   Activity Tolerance Patient tolerated treatment well   Behavior During Therapy [facility] for tasks assessed/performed      Past Medical History  Diagnosis Date  . Allergy   . Hypertension     Past Surgical History  Procedure Laterality Date  . Tubal ligation    . Cholecystectomy      There were no vitals filed for this visit.  Visit Diagnosis:  Posterior pain of right hip  Stiffness of right hip joint  Weakness of right leg  Difficulty walking      Subjective Assessment - 04/29/15 1624    Subjective Patient unsure of benefit from taping vs SIJ injection. Removed the tape this morining.   Currently in Pain? Yes   Pain Score 2    Pain Location Back   Pain Orientation Mid;Lower          Today's Treatment  TherEx NuStep level 5 x 4 min Bridge with feet on orange (55cm) Pball x10 Bridge + Hamstring tuck with feet on orange (55cm) Pball x10 Bridge + Alternating hip flexion/SLR with feet on orange (55cm) Pball x10 R Hamstring stretch 2x30" R ITB stretch in L side-lying 2x30" R QL stretch in L side-lying 2x30" R RF stretch in L side-lying 2x30" Bridge + B Hip ABD/ER clam with Blue TB x10 Bridge + Alternating Hip ABD/ER with Blue TB x10 R Hip ABD/ER clam in L side-lying with Blue TB x10 BATCA Knee Flexion 20# x10 B concentric/eccentric, 15# x10 B concentric/R eccentric TRX Squat x10 TRX Squat + Heel raise  x10  Kinesiotape to R Gluteus medius - 2 strips (30%) from posterior iliac crest to greater trochanter & lateral iliac crest to just distal to greater trochanter          PT Long Term Goals - 04/29/15 1812    PT LONG TERM GOAL #1   Title Pt will be independent with HEP/gym program by 05/17/15   Status On-going   PT LONG TERM GOAL #2   Title Pt will demonstrate right hip strength 4+/5 or greater by 05/17/15   Status On-going   PT LONG TERM GOAL #3   Title Pt will complete household chores and tolerate 12 hr work shift with pain no greater than 4/10 by 05/17/15   Status On-going               Plan - 04/29/15 1808    Clinical Impression Statement Patient reporting improvement in SIJ pain after injection last week but uncertain of benefit from taping for glut medius. Currently only reporting low intensity midline LBP at level of L/S juction. Able to resume strengthening and stabilization exercises with some fatigue but no increased pain. Taping reapplied to glut medius but ITB and SIJ deferred at this time based on pain and flexibility during today's visit.   PT Next Visit Plan Hip/proximal LE flexibillity and  strengthening: Manual therapy for STM/DTM, Taping & Modalities PRN.     Consulted and Agree with Plan of Care Patient        Problem List Patient Active Problem List   Diagnosis Date Noted  . 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 Scapular dysfunction 03/20/2015  . 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 Nonallopathic lesion of lumbosacral region 06/29/2014  . Nonallopathic lesion of sacral region 06/29/2014  . Nonallopathic lesion of pelvic region 06/29/2014  . Recurrent UTI 05/18/2013  . Back pain, acute 10/07/2010  . Allergic rhinitis 08/14/2010  . Essential hypertension, benign 01/22/2010    Percival Spanish, PT, MPT 04/29/2015, 6:16 PM  [facility] [address]  [place], [place], [address] Phone: [phone]   Fax:  [phone]  Name: SORIYA WORSTER MRN: [id number] Date of Birth: 31-Dec-1975

## 2015-05-02 ENCOUNTER — Ambulatory Visit: Payer: 59

## 2015-05-02 DIAGNOSIS — R29898 Other symptoms and signs involving the musculoskeletal system: Secondary | ICD-10-CM

## 2015-05-02 DIAGNOSIS — M25551 Pain in right hip: Secondary | ICD-10-CM | POA: Diagnosis not present

## 2015-05-02 DIAGNOSIS — M25651 Stiffness of right hip, not elsewhere classified: Secondary | ICD-10-CM

## 2015-05-02 DIAGNOSIS — R262 Difficulty in walking, not elsewhere classified: Secondary | ICD-10-CM | POA: Diagnosis not present

## 2015-05-02 NOTE — Therapy (Signed)
[facility] [address]  [place], [place], [address] Phone: [phone]   Fax:  [phone]  Physical Therapy Treatment  Patient Details  Name: [name] MRN: [id number] Date of Birth: October 26, 1975 Referring Provider: Hulan Saas, MD  Encounter Date: 05/02/2015      PT End of Session - 05/02/15 1710    Visit Number 9   Number of Visits 12   Date for PT Re-Evaluation 05/17/15   PT Start Time 1702   PT Stop Time 1748   PT Time Calculation (min) 46 min   Activity Tolerance Patient tolerated treatment well   Behavior During Therapy [facility] for tasks assessed/performed      Past Medical History  Diagnosis Date  . Allergy   . Hypertension     Past Surgical History  Procedure Laterality Date  . Tubal ligation    . Cholecystectomy      There were no vitals filed for this visit.  Visit Diagnosis:  Posterior pain of right hip  Stiffness of right hip joint  Weakness of right leg      Subjective Assessment - 05/02/15 1707    Subjective Pt. reports SI joint pain more toward R posterior side as 3/10   Patient Stated Goals "decrease pain & build up muscle to improve stability"   Currently in Pain? Yes   Pain Score 3    Pain Location Back   Pain Orientation Right;Lower   Pain Descriptors / Indicators Aching   Pain Type Chronic pain   Pain Radiating Towards n/a   Pain Onset More than a month ago   Pain Frequency Constant   Aggravating Factors  R side sleeping    Pain Relieving Factors ice, anti-inflammatory    Multiple Pain Sites No      Today's Treatment  TherEx NuStep level 5 x 4 min Bridge with feet on orange (55cm) Pball x10 Bridge + Hamstring tuck with feet on orange (55cm) Pball x10 Bridge + Alternating hip flexion/SLR with feet on orange (55cm) Pball x10 B Fitter abduction, extension (2 blue bands) x 10 reps  Hook-lying clam shells with blue TB around knees x 10 reps BATCA Knee Flexion  20# x10 B concentric/eccentric, 15# x10 B concentric/R eccentric BATCA knee ext. 20# x 10 Forward step ups on 8" x 10 reps each leg  Side step ups on 8" step x 10 each way      PT Long Term Goals - 04/29/15 1812    PT LONG TERM GOAL #1   Title Pt will be independent with HEP/gym program by 05/17/15   Status On-going   PT LONG TERM GOAL #2   Title Pt will demonstrate right hip strength 4+/5 or greater by 05/17/15   Status On-going   PT LONG TERM GOAL #3   Title Pt will complete household chores and tolerate 12 hr work shift with pain no greater than 4/10 by 05/17/15   Status On-going          Plan - 05/02/15 1711    Clinical Impression Statement Pt. with 3/10 R posterior hip / SI pain initially which remained throughout hip / knee strengthening activity.  Fitter added for hip ext / abduction training today with pt. tolerating well with no increase in pain.     PT Next Visit Plan Hip/proximal LE flexibillity and strengthening: Manual therapy for STM/DTM, Taping & Modalities PRN.     Consulted and Agree with Plan of Care Patient  Problem List Patient Active Problem List   Diagnosis Date Noted  . 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 Scapular dysfunction 03/20/2015  . 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 Nonallopathic lesion of lumbosacral region 06/29/2014  . Nonallopathic lesion of sacral region 06/29/2014  . Nonallopathic lesion of pelvic region 06/29/2014  . Recurrent UTI 05/18/2013  . Back pain, acute 10/07/2010  . Allergic rhinitis 08/14/2010  . Essential hypertension, benign 01/22/2010    Bess Harvest, PTA 05/02/2015, 6:00 PM  [facility] [address]  [place], [place], [address] Phone: [phone]   Fax:  [phone]  Name: [name] MRN: [id number] Date of Birth: 1975-08-03

## 2015-05-06 ENCOUNTER — Ambulatory Visit: Payer: 59 | Admitting: Physical Therapy

## 2015-05-06 DIAGNOSIS — M25651 Stiffness of right hip, not elsewhere classified: Secondary | ICD-10-CM | POA: Diagnosis not present

## 2015-05-06 DIAGNOSIS — R29898 Other symptoms and signs involving the musculoskeletal system: Secondary | ICD-10-CM | POA: Diagnosis not present

## 2015-05-06 DIAGNOSIS — R262 Difficulty in walking, not elsewhere classified: Secondary | ICD-10-CM

## 2015-05-06 DIAGNOSIS — M25551 Pain in right hip: Secondary | ICD-10-CM

## 2015-05-06 NOTE — Therapy (Signed)
[facility] [address]  [place], [place], [address] Phone: [phone]   Fax:  [phone]  Physical Therapy Treatment  Patient Details  Name: [name] MRN: [id number] Date of Birth: May 07, 1975 Referring Provider: Hulan Saas, MD  Encounter Date: 05/06/2015      PT End of Session - 05/06/15 0854    Visit Number 10   Number of Visits 12   Date for PT Re-Evaluation 05/17/15   PT Start Time 0847   PT Stop Time 0929   PT Time Calculation (min) 42 min   Activity Tolerance Patient tolerated treatment well   Behavior During Therapy [facility] for tasks assessed/performed      Past Medical History  Diagnosis Date  . Allergy   . Hypertension     Past Surgical History  Procedure Laterality Date  . Tubal ligation    . Cholecystectomy      There were no vitals filed for this visit.  Visit Diagnosis:  Posterior pain of right hip  Stiffness of right hip joint  Weakness of right leg  Difficulty walking      Subjective Assessment - 05/06/15 0851    Subjective Patient continues to report pain is more in R SIJ than hip/buttock at this point. Better than before injection but still bothersome. States she has been working on adjusting her heel inserts to correct her ankle alignment to see if this will help. Does feel like taping last week helped.   Currently in Pain? Yes   Pain Score --  2-3/10   Pain Location Back   Pain Orientation Right;Lower            [facility] PT Assessment - 05/06/15 0001    Observation/Other Assessments   Focus on Therapeutic Outcomes (FOTO)  Hip 66% (34% limitation)         Today's Treatment  Manual  MET to correct R anterior innominate rotation at SIJ Kinesiotape:   R Gluteus medius - 2 strips (30%) from posterior iliac crest to greater trochanter & lateral iliac crest to just distal to greater trochanter   Star (50%) to R SIJ  TherEx NuStep level 5 x 4 min Bridge  with feet on orange (55cm) Pball x15 Bridge + Hamstring tuck with feet on orange (55cm) Pball x10 Bridge + Alternating hip flexion/SLR with feet on orange (55cm) Pball x10 B Sidelying Hip ABD/ER clam shells with blue TB around knees x 10 reps R SLS rotational stabilization with red TB x10 BATCA Knee Flexion 25# x10 B concentric/eccentric, 15# x10 B concentric/R eccentric B Fitter Hip abduction & extension (1 Black/1 Blue) x 10 reps each         PT Long Term Goals - 05/06/15 0932    PT LONG TERM GOAL #1   Title Pt will be independent with HEP/gym program by 05/17/15   Status On-going   PT LONG TERM GOAL #2   Title Pt will demonstrate right hip strength 4+/5 or greater by 05/17/15   Status On-going   PT LONG TERM GOAL #3   Title Pt will complete household chores and tolerate 12 hr work shift with pain no greater than 4/10 by 05/17/15   Status On-going               Plan - 05/06/15 0918    Clinical Impression Statement Patient continuing to note more R SIJ pain today with alignment revealing slight R anterior innominate rotation at SIJ, which was  corrected with MET. Kinesiotape applied to R SIJ and glut medius to promote reduced irritation and pain. Patient demonstrating good tolerance for exercises other than some mild fatigue but no increased pain. Given proximity to end of POC and continued pain, may need to consider recert  on last visit.   PT Next Visit Plan Hip/proximal LE flexibillity and strengthening: Manual therapy for STM/DTM, Taping & Modalities PRN.     Consulted and Agree with Plan of Care Patient        Problem List Patient Active Problem List   Diagnosis Date Noted  . 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 Scapular dysfunction 03/20/2015  . 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 Nonallopathic lesion of lumbosacral region 06/29/2014  . Nonallopathic lesion of sacral region 06/29/2014  .  Nonallopathic lesion of pelvic region 06/29/2014  . Recurrent UTI 05/18/2013  . Back pain, acute 10/07/2010  . Allergic rhinitis 08/14/2010  . Essential hypertension, benign 01/22/2010    Percival Spanish, PT, MPT 05/06/2015, 10:20 AM  [facility] [address]  [place], [place], [address] Phone: [phone]   Fax:  [phone]  Name: [name] MRN: [id number] Date of Birth: [birth year]

## 2015-05-10 ENCOUNTER — Ambulatory Visit: Payer: 59 | Attending: Family Medicine

## 2015-05-10 ENCOUNTER — Ambulatory Visit: Payer: 59 | Admitting: Family Medicine

## 2015-05-10 DIAGNOSIS — R29898 Other symptoms and signs involving the musculoskeletal system: Secondary | ICD-10-CM | POA: Diagnosis not present

## 2015-05-10 DIAGNOSIS — M25551 Pain in right hip: Secondary | ICD-10-CM | POA: Diagnosis not present

## 2015-05-10 DIAGNOSIS — M25651 Stiffness of right hip, not elsewhere classified: Secondary | ICD-10-CM | POA: Diagnosis not present

## 2015-05-10 DIAGNOSIS — R262 Difficulty in walking, not elsewhere classified: Secondary | ICD-10-CM | POA: Insufficient documentation

## 2015-05-10 NOTE — Therapy (Signed)
[facility] [address]  [place], [place], [address] Phone: [phone]   Fax:  [phone]  Physical Therapy Treatment  Patient Details  Name: [name] MRN: [id number] Date of Birth: 11-05-1975 Referring Provider: Hulan Saas, MD  Encounter Date: 05/10/2015      PT End of Session - 05/10/15 0821    Visit Number 11   Number of Visits 12   Date for PT Re-Evaluation 05/17/15   PT Start Time 0805   PT Stop Time 0848   PT Time Calculation (min) 43 min   Activity Tolerance Patient tolerated treatment well   Behavior During Therapy [facility] for tasks assessed/performed      Past Medical History  Diagnosis Date  . Allergy   . Hypertension     Past Surgical History  Procedure Laterality Date  . Tubal ligation    . Cholecystectomy      There were no vitals filed for this visit.  Visit Diagnosis:  Posterior pain of right hip  Stiffness of right hip joint  Weakness of right leg      Subjective Assessment - 05/10/15 0816    Subjective Pt. reports pain relief since last visit with star taping over SI joint.  Pt. R hip / buttocks pain reported as a 2/10 at rest.     Patient Stated Goals "decrease pain & build up muscle to improve stability"   Currently in Pain? Yes   Pain Score 2    Pain Location Buttocks   Pain Orientation Right;Lower   Pain Descriptors / Indicators Aching   Pain Type Chronic pain   Pain Radiating Towards n/a   Pain Onset More than a month ago   Pain Frequency Constant   Aggravating Factors  R side sleeping   Pain Relieving Factors Ice, anti-inflammatory   Multiple Pain Sites No       TherEx Bridge with alteranting isometrics / ER with blue TB around knees 3 x 5     Bridge + Hamstring tuck with feet on orange (55cm) Pball x 15 Bridge + Alternating hip flexion/SLR x 10 each leg Bent knee Double leg raise with bent knees and red TB around ankles x 15 reps  Hook-lying  alternating marching x 10 each leg  BATCA Knee Flexion 25# x10 B concentric/eccentric, 15# x10 B concentric/R eccentric B Fitter Hip abduction & extension (2 Blue) x 15 reps each    Manual:  Kinesiotape:  R Gluteus medius - 2 strips (30%) from posterior iliac crest to greater trochanter & lateral iliac crest to just distal to greater trochanter  Star (50%) to R SIJ        PT Long Term Goals - 05/06/15 0932    PT LONG TERM GOAL #1   Title Pt will be independent with HEP/gym program by 05/17/15   Status On-going   PT LONG TERM GOAL #2   Title Pt will demonstrate right hip strength 4+/5 or greater by 05/17/15   Status On-going   PT LONG TERM GOAL #3   Title Pt will complete household chores and tolerate 12 hr work shift with pain no greater than 4/10 by 05/17/15   Status On-going           Plan - 05/10/15 1696    Clinical Impression Statement Pt. reports pain relief from last visit star taping over SI joint; taping continued in today's treatment.  Pt. tolerated all hip / knee strengthening well with no  increase in pain.     PT Next Visit Plan Assess goals; Hip/proximal LE flexibillity and strengthening: Manual therapy for STM/DTM, Taping & Modalities PRN.  Assess goals.      Problem List Patient Active Problem List   Diagnosis Date Noted  . 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 Scapular dysfunction 03/20/2015  . 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 Nonallopathic lesion of lumbosacral region 06/29/2014  . Nonallopathic lesion of sacral region 06/29/2014  . Nonallopathic lesion of pelvic region 06/29/2014  . Recurrent UTI 05/18/2013  . Back pain, acute 10/07/2010  . Allergic rhinitis 08/14/2010  . Essential hypertension, benign 01/22/2010    Cassell Clement 05/10/2015, 12:40 PM  [facility] [address]  [place], [place], [address] Phone:  [phone]   Fax:  [phone]  Name: [name] MRN: [id number] Date of Birth: 12-27-1975

## 2015-05-13 ENCOUNTER — Ambulatory Visit: Payer: 59 | Admitting: Physical Therapy

## 2015-05-13 DIAGNOSIS — R29898 Other symptoms and signs involving the musculoskeletal system: Secondary | ICD-10-CM

## 2015-05-13 DIAGNOSIS — M25651 Stiffness of right hip, not elsewhere classified: Secondary | ICD-10-CM

## 2015-05-13 DIAGNOSIS — M25551 Pain in right hip: Secondary | ICD-10-CM | POA: Diagnosis not present

## 2015-05-13 DIAGNOSIS — R262 Difficulty in walking, not elsewhere classified: Secondary | ICD-10-CM | POA: Diagnosis not present

## 2015-05-13 NOTE — Therapy (Addendum)
[facility] [address]  [place], [place], [address] Phone: [phone]   Fax:  [phone]  Physical Therapy Treatment  Patient Details  Name: [name] MRN: [id number] Date of Birth: November 27, 1975 Referring Provider: Hulan Saas, MD  Encounter Date: 05/13/2015      PT End of Session - 05/13/15 1539    Visit Number 12   Number of Visits 12   PT Start Time 1535   PT Stop Time 1619   PT Time Calculation (min) 44 min   Activity Tolerance Patient tolerated treatment well   Behavior During Therapy [facility] for tasks assessed/performed      Past Medical History  Diagnosis Date  . Allergy   . Hypertension     Past Surgical History  Procedure Laterality Date  . Tubal ligation    . Cholecystectomy      There were no vitals filed for this visit.  Visit Diagnosis:  Posterior pain of right hip  Stiffness of right hip joint  Weakness of right leg  Difficulty walking      Subjective Assessment - 05/13/15 1537    Subjective Pt reports SIJ pain better today, with tape removed this morning. Patient reporting more good days than bad at this point, with pain on average at 2-3/10. Pain can still get up to 6-7/10 at worst, but less common per pt report.   Currently in Pain? Yes   Pain Score 2    Pain Location Sacrum  Right SIJ   Pain Orientation Right;Lower            [facility] PT Assessment - 05/13/15 1535    Assessment   Medical Diagnosis Tendinopathy of right gluteal region   Onset Date/Surgical Date --  4 yrs ago   Next MD Visit 05/23/15  05/10/15 appt moved due to MD conflict   ROM / Strength   AROM / PROM / Strength Strength   Strength   Strength Assessment Site Hip;Knee   Right/Left Hip Right;Left   Right Hip Flexion 4+/5   Right Hip Extension 4/5   Right Hip ABduction 4+/5   Right Hip ADduction 4+/5   Left Hip Flexion 4+/5   Left Hip Extension 4/5   Left Hip ABduction 4+/5   Left Hip  ADduction 4+/5   Right/Left Knee Right;Left   Right Knee Flexion 4+/5   Right Knee Extension 4+/5   Left Knee Flexion --  5-/5   Left Knee Extension --  5-/5        Today's Treatment  MMT  Goal Assessment  TherEx Rec Bike L3 x 4' Bridge + Hamstring tuck with feet on orange (55cm) Pball x 15 Bridge with feet on orange (55cm) Pball + Alternating hip flexion/SLR x 10 each Bridge + alternating Hip ABD/ER with blue TB x15 SLS rotational stabilization with red TB med/lat x10 each BATCA Knee Flexion 25# x10 B concentric/eccentric, 20# x10 B concentric/R eccentric         PT Education - 05/13/15 1840    Education provided Yes   Education Details Summarized HEP   Person(s) Educated Patient   Methods Explanation;Demonstration;Handout   Comprehension Verbalized understanding;Returned demonstration             PT Long Term Goals - 05/13/15 1551    PT LONG TERM GOAL #1   Title Pt will be independent with HEP/gym program by 05/17/15   Status Achieved   PT LONG TERM GOAL #2  Title Pt will demonstrate right hip strength 4+/5 or greater by 05/17/15   Status Partially Met  Met except for hip extension at 4/5 (symmetrical with L hip)   PT LONG TERM GOAL #3   Title Pt will complete household chores and tolerate 12 hr work shift with pain no greater than 4/10 by 05/17/15   Status Partially Met  Pain typically no greater than 2-3/10               Plan - 05/13/15 1840    Clinical Impression Statement Patient has demonstrated good progress with PT with improving overall pain reports with pain typically 2-3/10 or less. She has demonstrated improved R hip strength with overall strength now 4+/5 except hip extension 4/5 and essentially symetrical to L. Patient reports she still experiences some pain, typically worst after full day of work, but less than before she started PT. Patient has reached the end of her current approved vists for this episode with goals only partially  met. When given the choice of recertification vs continuing on her own with her HEP, pt preferred to attempt to continue on her own with the HEP, therefore today's focus was on updating and summarizing HEP and gym program. Will place patient on hold x 30 days to return if symptoms worsen or issues arise with HEP. If no need to return within 30 days, will proceed with discharge for this episode.   PT Next Visit Plan 30 day hold; recert if needs to return, otherwise D/C from PT after 30 days   Consulted and Agree with Plan of Care Patient        Problem List Patient Active Problem List   Diagnosis Date Noted  . 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 Scapular dysfunction 03/20/2015  . 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 Nonallopathic lesion of lumbosacral region 06/29/2014  . Nonallopathic lesion of sacral region 06/29/2014  . Nonallopathic lesion of pelvic region 06/29/2014  . Recurrent UTI 05/18/2013  . Back pain, acute 10/07/2010  . Allergic rhinitis 08/14/2010  . Essential hypertension, benign 01/22/2010    Percival Spanish, PT, MPT 05/13/2015, 7:07 PM  [facility] [address]  [place], [place], [address] Phone: [phone]   Fax:  [phone]  Name: [name] MRN: [id number] Date of Birth: 30-Jan-1976   PHYSICAL THERAPY DISCHARGE SUMMARY  Visits from Start of Care: 12  Current functional level related to goals / functional outcomes:   Patient demonstrated good progress with PT with improving overall pain reports with pain typically 2-3/10 or less. She has demonstrated improved R hip strength with overall strength now 4+/5 except hip extension 4/5 and essentially symetrical to L. Patient reports she still experiences some pain, typically worst after full day of work, but less than before she started PT. Patient has reached the end of her current  approved vists for this episode with goals only partially met. When given the choice of recertification vs continuing on her own with her HEP, pt preferred to attempt to continue on her own with the HEP, but requested to be placed on hold x 30 days to return if symptoms worsen or issues arise with HEP. Pt has not returned within 30 days, therefore will proceed with discharge for this episode.   Remaining deficits:   Intermittent SIJ pain    Education / Equipment:   HEP   Plan: Patient agrees  to discharge.  Patient goals were partially met. Patient is being discharged due to being pleased with the current functional level.  ?????       Percival Spanish, PT, MPT 06/24/2015, 8:35 AM  [facility] [address]  [place], [place], [address] Phone: [phone]   Fax:  [phone]

## 2015-05-15 ENCOUNTER — Ambulatory Visit: Payer: 59

## 2015-05-23 ENCOUNTER — Encounter: Payer: Self-pay | Admitting: Family Medicine

## 2015-05-23 ENCOUNTER — Ambulatory Visit (INDEPENDENT_AMBULATORY_CARE_PROVIDER_SITE_OTHER): Payer: 59 | Admitting: Family Medicine

## 2015-05-23 ENCOUNTER — Ambulatory Visit (INDEPENDENT_AMBULATORY_CARE_PROVIDER_SITE_OTHER)
Admission: RE | Admit: 2015-05-23 | Discharge: 2015-05-23 | Disposition: A | Discharge status: Home / Self Care | Payer: 59 | Source: Ambulatory Visit | Attending: Family Medicine | Admitting: Family Medicine

## 2015-05-23 ENCOUNTER — Ambulatory Visit (INDEPENDENT_AMBULATORY_CARE_PROVIDER_SITE_OTHER): Payer: 59 | Admitting: Podiatry

## 2015-05-23 ENCOUNTER — Encounter: Payer: Self-pay | Admitting: Podiatry

## 2015-05-23 ENCOUNTER — Encounter: Payer: Self-pay | Admitting: Internal Medicine

## 2015-05-23 ENCOUNTER — Other Ambulatory Visit (INDEPENDENT_AMBULATORY_CARE_PROVIDER_SITE_OTHER): Payer: 59

## 2015-05-23 VITALS — BP 126/82 | HR 86 | Ht 63.0 in | Wt 137.0 lb

## 2015-05-23 VITALS — BP 144/90 | HR 74 | Resp 16

## 2015-05-23 DIAGNOSIS — M9905 Segmental and somatic dysfunction of pelvic region: Secondary | ICD-10-CM

## 2015-05-23 DIAGNOSIS — M533 Sacrococcygeal disorders, not elsewhere classified: Secondary | ICD-10-CM

## 2015-05-23 DIAGNOSIS — Q615 Medullary cystic kidney: Secondary | ICD-10-CM | POA: Diagnosis not present

## 2015-05-23 DIAGNOSIS — M545 Low back pain: Secondary | ICD-10-CM | POA: Diagnosis not present

## 2015-05-23 DIAGNOSIS — M5441 Lumbago with sciatica, right side: Secondary | ICD-10-CM

## 2015-05-23 DIAGNOSIS — M9903 Segmental and somatic dysfunction of lumbar region: Secondary | ICD-10-CM

## 2015-05-23 DIAGNOSIS — M9904 Segmental and somatic dysfunction of sacral region: Secondary | ICD-10-CM | POA: Diagnosis not present

## 2015-05-23 DIAGNOSIS — M779 Enthesopathy, unspecified: Secondary | ICD-10-CM

## 2015-05-23 DIAGNOSIS — B351 Tinea unguium: Secondary | ICD-10-CM | POA: Diagnosis not present

## 2015-05-23 DIAGNOSIS — I10 Essential (primary) hypertension: Secondary | ICD-10-CM | POA: Diagnosis not present

## 2015-05-23 DIAGNOSIS — L601 Onycholysis: Secondary | ICD-10-CM | POA: Diagnosis not present

## 2015-05-23 DIAGNOSIS — M999 Biomechanical lesion, unspecified: Secondary | ICD-10-CM

## 2015-05-23 DIAGNOSIS — L603 Nail dystrophy: Secondary | ICD-10-CM | POA: Diagnosis not present

## 2015-05-23 LAB — COMPREHENSIVE METABOLIC PANEL
ALT: 13 U/L (ref 0–35)
AST: 13 U/L (ref 0–37)
Albumin: 4.3 g/dL (ref 3.5–5.2)
Alkaline Phosphatase: 86 U/L (ref 39–117)
BUN: 16 mg/dL (ref 6–23)
CALCIUM: 9.3 mg/dL (ref 8.4–10.5)
CHLORIDE: 104 meq/L (ref 96–112)
CO2: 31 meq/L (ref 19–32)
CREATININE: 0.81 mg/dL (ref 0.40–1.20)
GFR: 82.43 mL/min (ref >60.00)
GLUCOSE: 99 mg/dL (ref 70–99)
Potassium: 3.9 mEq/L (ref 3.5–5.1)
Sodium: 141 mEq/L (ref 135–145)
Total Bilirubin: 0.5 mg/dL (ref 0.2–1.2)
Total Protein: 7 g/dL (ref 6.0–8.3)

## 2015-05-23 LAB — LIPID PANEL
CHOL/HDL RATIO: 3
Cholesterol: 174 mg/dL (ref 0–200)
HDL: 66.2 mg/dL (ref >39.00)
LDL CALC: 93 mg/dL (ref 0–99)
NONHDL: 107.47
TRIGLYCERIDES: 73 mg/dL (ref 0.0–149.0)
VLDL: 14.6 mg/dL (ref 0.0–40.0)

## 2015-05-23 LAB — TSH: TSH: 0.96 u[IU]/mL (ref 0.35–4.50)

## 2015-05-23 LAB — CBC WITH DIFFERENTIAL/PLATELET
BASOS ABS: 0.1 10*3/uL (ref 0.0–0.1)
BASOS PCT: 0.6 % (ref 0.0–3.0)
EOS ABS: 0.3 10*3/uL (ref 0.0–0.7)
Eosinophils Relative: 3 % (ref 0.0–5.0)
HEMATOCRIT: 41.7 % (ref 36.0–46.0)
Hemoglobin: 14.1 g/dL (ref 12.0–15.0)
LYMPHS ABS: 2.8 10*3/uL (ref 0.7–4.0)
Lymphocytes Relative: 30.8 % (ref 12.0–46.0)
MCHC: 33.9 g/dL (ref 30.0–36.0)
MCV: 90.9 fl (ref 78.0–100.0)
Monocytes Absolute: 0.6 10*3/uL (ref 0.1–1.0)
Monocytes Relative: 6.3 % (ref 3.0–12.0)
NEUTROS PCT: 59.3 % (ref 43.0–77.0)
Neutro Abs: 5.3 10*3/uL (ref 1.4–7.7)
PLATELETS: 301 10*3/uL (ref 150.0–400.0)
RBC: 4.59 Mil/uL (ref 3.87–5.11)
RDW: 13.2 % (ref 11.5–15.5)
WBC: 9 10*3/uL (ref 4.0–10.5)

## 2015-05-23 MED ORDER — HYDROXYZINE HCL 10 MG PO TABS: 10.0000 mg | ORAL_TABLET | Freq: Three times a day (TID) | ORAL | Status: DC | PRN

## 2015-05-23 MED ORDER — VENLAFAXINE HCL ER 75 MG PO CP24: 75.0000 mg | ORAL_CAPSULE | Freq: Every day | ORAL | Status: DC

## 2015-05-23 MED FILL — VENLAFAXINE HCL ER 75 MG CA: 75 | 30 days supply | Qty: 30 | Fill #0

## 2015-05-23 MED FILL — hydrOXYzine HCL 10 MG TABS: 10 | 10 days supply | Qty: 30 | Fill #0

## 2015-05-23 NOTE — Progress Notes (Signed)
Subjective:     Patient ID: [name], female   DOB: 1975-12-21, 40 y.o.   MRN: [id number]  HPI patient presents with a long-term history of discoloration of the right big toe. Patient was on oral terbinafine but did develop seen from this and was unable to take and has been on another antifungal years ago and did get some improvement but has had significant reoccurrence with darkening over the last 6 months patient also has flatfoot deformity and it wears orthotics and is had problems with the sacroiliac joint   Review of Systems  All other systems reviewed and are negative.      Objective:   Physical Exam  Constitutional: She is oriented to person, place, and time.  Cardiovascular: Intact distal pulses.   Musculoskeletal: Normal range of motion.  Neurological: She is oriented to person, place, and time.  Skin: Skin is warm.  Nursing note and vitals reviewed.  neurovascular status intact muscle strength adequate range of motion within normal limits with patient found to have thickness and discoloration of the hallux nail distal two thirds with darkening also noted within the nailbed itself. 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 At this time using sterile instrumentation biopsy of the nail was accomplished and we will send this off for evaluation. We will see her back in 4 weeks and I did discuss the possibilities for Diflucan treatment along with laser therapy topical at that time depending on results of culture. Reappoint to recheck 4 weeks

## 2015-05-23 NOTE — Progress Notes (Signed)
Pre visit review using our clinic review tool, if applicable. No additional management support is needed unless otherwise documented below in the visit note. 

## 2015-05-23 NOTE — Assessment & Plan Note (Signed)
Decision today to treat with OMT was based on Physical Exam  After verbal consent patient was treated with HVLA, ME techniques in lumbar sacral and ilium areas  Patient tolerated the procedure well with improvement in symptoms  Patient given exercises, stretches and lifestyle modifications  See medications in patient instructions if given  Patient will follow up in 4 weeks

## 2015-05-23 NOTE — Progress Notes (Signed)
   Subjective:    Patient ID: [name], female    DOB: [birth year], 40 y.o.   MRN: [id number]  HPI Pt presents with right hallux nail discoloration   Review of Systems  All other systems reviewed and are negative.      Objective:   Physical Exam        Assessment & Plan:

## 2015-05-23 NOTE — Patient Instructions (Signed)
Good to see you  Ice is still good  Increase effexor to 75 mg daily  Hydroxyzine if needed for first couple of days on the new dose to help with the ramped up feeling  Xray downstairs See me again in 4-6 weeks.

## 2015-05-23 NOTE — Progress Notes (Signed)
[name] [place], [place] [address] Phone: [phone] Subjective:     CC: Low back pain follow up Upper back pain  RDE:[id number] [name] is a 40 y.o. female coming in with complaint of low back pain. Patient did have an MRI back in 2013 that showed a very small L5-S1 central protrusion but no significant nerve root impingement. Patient states she is given an epidural steroid injection but we did not have any significant improvement.   Patient had been responding very well to osteopathic manipulation But at last follow-up patient was having more of severe sacroiliac joint pain. Patient was given an injection as well as prednisone. Has been going to formal physical therapy. Feels like she has made improvement. Feels like she is near her baseline. Also started her on Effexor. Patient denies any significant side effects. Overall would state that she is about 60-70% better. Nothing that is stopping her from activities. Still if she moves quickly she can have sharp pain but only last seconds.  Past Medical History  Diagnosis Date  . Allergy   . Hypertension    Past Surgical History  Procedure Laterality Date  . Tubal ligation    . Cholecystectomy     Social History  Substance Use Topics  . Smoking status: Never Smoker   . Smokeless tobacco: None  . Alcohol Use: Yes   Family History  Problem Relation Age of Onset  . Cancer Mother     Breast Cancer  . Hypertension Father   . Hyperlipidemia Father   . Stroke Maternal Grandmother   . Alcohol abuse Paternal Grandmother   . Heart disease Paternal Grandfather   . Other Brother     lyme   Allergies  Allergen Reactions  . Lamisil [Terbinafine Hcl] Hives and Itching  . Terbinafine Hcl Hives       Past medical history, social, surgical and family history all reviewed in electronic medical record.   Review of Systems: No headache, visual changes, nausea, vomiting, diarrhea,  constipation, dizziness, abdominal pain, skin rash, fevers, chills, night sweats, weight loss, swollen lymph nodes, body aches, joint swelling, muscle aches, chest pain, shortness of breath, mood changes.   Objective Blood pressure 126/82, pulse 86, height 5' 3"  (1.6 m), weight 137 lb (62.143 kg), SpO2 96 %.  General: No apparent distress alert and oriented x3 mood and affect normal, dressed appropriately.  HEENT: Pupils equal, extraocular movements intact  Respiratory: Patient's speak in full sentences and does not appear short of breath  Cardiovascular: No lower extremity edema, non tender, no erythema  Skin: Warm dry intact with no signs of infection or rash on extremities or on axial skeleton.  Abdomen: Soft nontender  Neuro: Cranial nerves II through XII are intact, neurovascularly intact in all extremities with 2+ DTRs and 2+ pulses.  Lymph: No lymphadenopathy of posterior or anterior cervical chain or axillae bilaterally.  Gait normal with good balance and coordination.  MSK:  Non tender with full range of motion and good stability and symmetric strength and tone of shoulders, elbows, wrist, hip, knee and ankles bilaterally.  Back Exam:  Inspection: Unremarkable  Motion: Flexion 35 deg  Extension 25 deg, Side Bending to 45 deg bilaterally, Some improvement in function Rotation to 45 deg bilaterally  SLR laying: Negative  XSLR laying: Negative  Palpable tenderness: I'll tenderness over the right sacroiliac joint FABER: Mild positive right side Sensory change: Gross sensation intact to all lumbar and sacral  dermatomes.  Mild scapular dyskinesia noted on the right side Reflexes: 2+ at both patellar tendons, 2+ at achilles tendons, Babinski's downgoing.  Strength at foot  Plantar-flexion: 5/5 Dorsi-flexion: 5/5 Eversion: 5/5 Inversion: 5/5  Leg strength  Quad: 5/5 Hamstring: 5/5 Hip flexor: 5/5 Hip abductors: 4/5 on left with full strength on right Gait unremarkable.  Osteopathic  findings T3 extended rotated and side bent right L2 flexed rotated and side bent right Sacrum left on left Right posterior ilium  Impression and Recommendations:     This case required medical decision making of moderate complexity.

## 2015-05-23 NOTE — Assessment & Plan Note (Signed)
Still likely secondary to sacroiliac joint disease. Possible lumbar radiculopathy is within the differential. X-rays ordered today to further evaluate with patient's history of MRI in 2013. We discussed if any worsening symptoms with her not completely resolving with the injection in the sacroiliac joint previously and there is a possibility of advanced imaging would be warranted if any worsening symptoms. Encourage patient to continue the home exercises working on hip and core strengthening. Patient and will come back and see me again in 4-6 weeks for further evaluation and treatment. Increase patient's Effexor to help with the radicular symptoms.

## 2015-05-23 NOTE — Addendum Note (Signed)
Addended by: Harriett Sine D on: 05/23/2015 05:16 PM   Modules accepted: Orders

## 2015-06-05 MED FILL — AMLODIPINE BESYLATE 2.5 MG: 2.5 | 90 days supply | Qty: 90 | Fill #1

## 2015-06-14 ENCOUNTER — Encounter: Payer: Self-pay | Admitting: Podiatry

## 2015-06-17 ENCOUNTER — Other Ambulatory Visit: Payer: Self-pay

## 2015-06-17 DIAGNOSIS — Z1231 Encounter for screening mammogram for malignant neoplasm of breast: Secondary | ICD-10-CM

## 2015-06-20 ENCOUNTER — Ambulatory Visit: Payer: 59 | Admitting: Podiatry

## 2015-06-25 MED FILL — VENLAFAXINE HCL ER 75 MG CA: 75 | 30 days supply | Qty: 30 | Fill #1

## 2015-06-27 ENCOUNTER — Encounter: Payer: Self-pay | Admitting: Family Medicine

## 2015-06-27 ENCOUNTER — Encounter: Payer: Self-pay | Admitting: Podiatry

## 2015-06-27 ENCOUNTER — Ambulatory Visit (INDEPENDENT_AMBULATORY_CARE_PROVIDER_SITE_OTHER): Payer: 59 | Admitting: Family Medicine

## 2015-06-27 ENCOUNTER — Ambulatory Visit (INDEPENDENT_AMBULATORY_CARE_PROVIDER_SITE_OTHER): Payer: 59 | Admitting: Podiatry

## 2015-06-27 VITALS — BP 116/80 | HR 88 | Ht 63.0 in | Wt 138.0 lb

## 2015-06-27 DIAGNOSIS — M999 Biomechanical lesion, unspecified: Secondary | ICD-10-CM

## 2015-06-27 DIAGNOSIS — M9903 Segmental and somatic dysfunction of lumbar region: Secondary | ICD-10-CM | POA: Diagnosis not present

## 2015-06-27 DIAGNOSIS — M9905 Segmental and somatic dysfunction of pelvic region: Secondary | ICD-10-CM | POA: Diagnosis not present

## 2015-06-27 DIAGNOSIS — M67951 Unspecified disorder of synovium and tendon, right thigh: Secondary | ICD-10-CM

## 2015-06-27 DIAGNOSIS — M9904 Segmental and somatic dysfunction of sacral region: Secondary | ICD-10-CM

## 2015-06-27 DIAGNOSIS — M7601 Gluteal tendinitis, right hip: Secondary | ICD-10-CM | POA: Diagnosis not present

## 2015-06-27 DIAGNOSIS — M533 Sacrococcygeal disorders, not elsewhere classified: Secondary | ICD-10-CM

## 2015-06-27 DIAGNOSIS — B351 Tinea unguium: Secondary | ICD-10-CM

## 2015-06-27 MED ORDER — FLUCONAZOLE 150 MG PO TABS: 150.0000 mg | ORAL_TABLET | Freq: Once | ORAL | Status: DC

## 2015-06-27 MED FILL — FLUCONAZOLE 150 MG TABLET: 150 | 42 days supply | Qty: 6 | Fill #0

## 2015-06-27 NOTE — Assessment & Plan Note (Signed)
Decision today to treat with OMT was based on Physical Exam  After verbal consent patient was treated with HVLA, ME techniques in lumbar sacral and ilium areas  Patient tolerated the procedure well with improvement in symptoms  Patient given exercises, stretches and lifestyle modifications  See medications in patient instructions if given  Patient will follow up in 4 weeks

## 2015-06-27 NOTE — Progress Notes (Signed)
[name] [place], [place] [address] Phone: [phone] Subjective:     CC: Low back pain follow up Upper back pain  SEG:[id number] [name] is a 40 y.o. female coming in with complaint of low back pain. Patient did have an MRI back in 2013 that showed a very small L5-S1 central protrusion but no significant nerve root impingement. Patient states she is given an epidural steroid injection but we did not have any significant improvement.   Patient had been responding very well to osteopathic manipulation  Patient unfortunately continues to have significant amount pain. We have tried a sacroiliac joint injection previously with very mild improvement as well. Continues to give her this exacerbating factor. Appears to be more of the obturator versus possible piriformis. Patient states that it is beginning or not that is causing her to change her daily activities. Sometimes can wake her up at night. Patient feels that all the therapies we're doing at this point are not completely resolved.  Past Medical History  Diagnosis Date  . Allergy   . Hypertension    Past Surgical History  Procedure Laterality Date  . Tubal ligation    . Cholecystectomy     Social History  Substance Use Topics  . Smoking status: Never Smoker   . Smokeless tobacco: None  . Alcohol Use: Yes   Family History  Problem Relation Age of Onset  . Cancer Mother     Breast Cancer  . Hypertension Father   . Hyperlipidemia Father   . Stroke Maternal Grandmother   . Alcohol abuse Paternal Grandmother   . Heart disease Paternal Grandfather   . Other Brother     lyme   Allergies  Allergen Reactions  . Lamisil [Terbinafine Hcl] Hives and Itching  . Terbinafine Hcl Hives       Past medical history, social, surgical and family history all reviewed in electronic medical record.   Review of Systems: No headache, visual changes, nausea, vomiting, diarrhea,  constipation, dizziness, abdominal pain, skin rash, fevers, chills, night sweats, weight loss, swollen lymph nodes, body aches, joint swelling, muscle aches, chest pain, shortness of breath, mood changes.   Objective Blood pressure 116/80, pulse 88, height 5' 3"  (1.6 m), weight 138 lb (62.596 kg), SpO2 97 %.  General: No apparent distress alert and oriented x3 mood and affect normal, dressed appropriately.  HEENT: Pupils equal, extraocular movements intact  Respiratory: Patient's speak in full sentences and does not appear short of breath  Cardiovascular: No lower extremity edema, non tender, no erythema  Skin: Warm dry intact with no signs of infection or rash on extremities or on axial skeleton.  Abdomen: Soft nontender  Neuro: Cranial nerves II through XII are intact, neurovascularly intact in all extremities with 2+ DTRs and 2+ pulses.  Lymph: No lymphadenopathy of posterior or anterior cervical chain or axillae bilaterally.  Gait normal with good balance and coordination.  MSK:  Non tender with full range of motion and good stability and symmetric strength and tone of shoulders, elbows, wrist, hip, knee and ankles bilaterally.  Back Exam:  Inspection: Unremarkable  Motion: Flexion 35 deg  Extension 25 deg, Side Bending to 45 deg bilaterally, Some improvement in function Rotation to 45 deg bilaterally  SLR laying: Negative  XSLR laying: Negative  Palpable tenderness: continuedtenderness over the right sacroiliac joint FABER: Mild positive right side patient is also having more pain over the obturator muscle in piriformis of the right  side Sensory change: Gross sensation intact to all lumbar and sacral dermatomes.  Mild scapular dyskinesia noted on the right side Reflexes: 2+ at both patellar tendons, 2+ at achilles tendons, Babinski's downgoing.  Strength at foot  Plantar-flexion: 5/5 Dorsi-flexion: 5/5 Eversion: 5/5 Inversion: 5/5  Leg strength  Quad: 5/5 Hamstring: 5/5 Hip flexor:  5/5 Hip abductors: 4/5 symmetric which is an improvement Gait unremarkable.  Osteopathic findings T3 extended rotated and side bent right L2 flexed rotated and side bent right Sacrum left on left Right posterior ilium  Impression and Recommendations:     This case required medical decision making of moderate complexity.

## 2015-06-27 NOTE — Patient Instructions (Signed)
Good to see you  Make an appointment for PRP and send me a message Come 30 minutes early ad we will get it all set See you soon

## 2015-06-27 NOTE — Assessment & Plan Note (Signed)
Incision have significant tenderness over the right gluteal region. Patient has had injections and some further obturator muscle. Patient would like to try a PRP. Will be scheduled for this. Encourage her to continue the conservative therapy including core strengthening and ergonomics throughout the work week. Patient has different medications for breakthrough pain including meloxicam. Is on Effexor but has not notice significant improvement. Do not think I want increased dose at this time. We'll discuss again in follow-up in 4 weeks.

## 2015-06-27 NOTE — Progress Notes (Signed)
Subjective:     Patient ID: [name], female   DOB: [birth year], 40 y.o.   MRN: [id number]  HPI patient presents for results of her fungal cultures that were performed   Review of Systems     Objective:   Physical Exam Neurovascular status intact muscle strength adequate with yellow discoloration of the distal two thirds of the nailbed right that does have fungal appearance    Assessment:     Mycotic appearance to the right hallux nail    Plan:     Reviewed cultures indicating that there is a fungus noted and I have recommended topical oral and laser treatment and reviewed the treatment options and she will have Diflucan 1 pill per week for 6 weeks and we will initiate laser and topical treatment at this time

## 2015-06-27 NOTE — Progress Notes (Signed)
Pre visit review using our clinic review tool, if applicable. No additional management support is needed unless otherwise documented below in the visit note. 

## 2015-07-04 ENCOUNTER — Encounter: Payer: Self-pay | Admitting: Family Medicine

## 2015-07-04 ENCOUNTER — Ambulatory Visit
Admission: RE | Admit: 2015-07-04 | Discharge: 2015-07-04 | Disposition: A | Discharge status: Home / Self Care | Payer: 59 | Source: Ambulatory Visit

## 2015-07-04 DIAGNOSIS — Z1231 Encounter for screening mammogram for malignant neoplasm of breast: Secondary | ICD-10-CM

## 2015-07-15 ENCOUNTER — Ambulatory Visit (INDEPENDENT_AMBULATORY_CARE_PROVIDER_SITE_OTHER): Payer: Self-pay | Admitting: Family Medicine

## 2015-07-15 ENCOUNTER — Other Ambulatory Visit: Payer: Self-pay

## 2015-07-15 ENCOUNTER — Encounter: Payer: 59 | Admitting: Internal Medicine

## 2015-07-15 ENCOUNTER — Encounter: Payer: Self-pay | Admitting: Family Medicine

## 2015-07-15 VITALS — BP 118/76 | HR 82 | Wt 137.0 lb

## 2015-07-15 DIAGNOSIS — M67951 Unspecified disorder of synovium and tendon, right thigh: Secondary | ICD-10-CM

## 2015-07-15 DIAGNOSIS — M7601 Gluteal tendinitis, right hip: Secondary | ICD-10-CM

## 2015-07-15 NOTE — Patient Instructions (Signed)
Good to see you  Ice 20 minutes 2 times daily. Usually after activity and before bed. Next 48 hours keep it simple.  Starting this weekend can try to increase activity.  Start 66mnutes walk 3 times a week and increase 5 minutes a week.  OK to do 1-2 days of lifting next week but start 50% of weight and increase 5-10% a week  Follow up with me in 2-3 weeks and then we will get you dong more!

## 2015-07-15 NOTE — Assessment & Plan Note (Signed)
Patient given injection today and tolerated the procedure very well. Patient given a return to sport progression that will be very slow over the course of the next 2-6 weeks. Patient will follow-up in 3 weeks. Avoid icing for the next 48 hours. Discussed any worsening symptoms to call back immediately.

## 2015-07-15 NOTE — Progress Notes (Signed)
    Subjective:    Patient ID: [name], female    DOB: 16-Oct-1975, 40 y.o.   MRN: [id number]  HPI error    Medications and allergies reviewed with patient and updated if appropriate.  Patient Active Problem List   Diagnosis Date Noted  . 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 Scapular dysfunction 03/20/2015  . 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 Nonallopathic lesion of lumbosacral region 06/29/2014  . Nonallopathic lesion of sacral region 06/29/2014  . Nonallopathic lesion of pelvic region 06/29/2014  . Recurrent UTI 05/18/2013  . Back pain, acute 10/07/2010  . Allergic rhinitis 08/14/2010  . Essential hypertension, benign 01/22/2010    Current Outpatient Prescriptions on File Prior to Visit  Medication Sig Dispense Refill  . acetaminophen (TYLENOL) 650 MG CR tablet Take 650 mg by mouth every 8 (eight) hours as needed for pain.    Marland Kitchen amLODipine (NORVASC) 2.5 MG tablet Take 1 tablet (2.5 mg total) by mouth daily. 90 tablet 1  . fluconazole (DIFLUCAN) 150 MG tablet Take 1 tablet (150 mg total) by mouth once. Take one per week for 6 weeks 6 tablet 0  . hydrOXYzine (ATARAX/VISTARIL) 10 MG tablet Take 1 tablet (10 mg total) by mouth 3 (three) times daily as needed. 30 tablet 0  . ibuprofen (ADVIL,MOTRIN) 200 MG tablet Take 200 mg by mouth. Reported on 04/05/2015    . meloxicam (MOBIC) 15 MG tablet Take 1 tablet (15 mg total) by mouth daily. 90 tablet 1  . nitrofurantoin (MACRODANTIN) 50 MG capsule   2  . venlafaxine XR (EFFEXOR-XR) 75 MG 24 hr capsule Take 1 capsule (75 mg total) by mouth daily with breakfast. 30 capsule 3   No current facility-administered medications on file prior to visit.    Past Medical History  Diagnosis Date  . Allergy   . Hypertension     Past Surgical History  Procedure Laterality Date  . Tubal ligation    . Cholecystectomy      Social History   Social History  . Marital  Status: Married    Spouse Name: N/A  . Number of Children: N/A  . Years of Education: N/A   Social History Main Topics  . Smoking status: Never Smoker   . Smokeless tobacco: Not on file  . Alcohol Use: Yes  . Drug Use: No  . Sexual Activity: Yes   Other Topics Concern  . 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 Cancer Mother     Breast Cancer  . Hypertension Father   . Hyperlipidemia Father   . Stroke Maternal Grandmother   . Alcohol abuse Paternal Grandmother   . Heart disease Paternal Grandfather   . Other Brother     lyme    Review of Systems     Objective:  There were no vitals filed for this visit. There were no vitals filed for this visit. There is no weight on file to calculate BMI.   Physical Exam        Assessment & Plan:    This encounter was created in error - please disregard.

## 2015-07-15 NOTE — Progress Notes (Signed)
[name] [place], [place] [address] Phone: [phone] Subjective:     CC: Low back pain follow up Upper back pain  [id number] [name] is a 40 y.o. female coming in with complaint of low back pain. Patient did have an MRI back in 2013 that showed a very small L5-S1 central protrusion but no significant nerve root impingement. Patient states she is given an epidural steroid injection but we did not have any significant improvement.   Patient had been responding very well to osteopathic manipulation    Patient though unfortunately continued pain. Patient had failed conservative therapy including injections. Patient continued to have the tendinopathy of the right gluteal region. Patient is here for a PRP injections.  Past Medical History  Diagnosis Date  . Allergy   . Hypertension    Past Surgical History  Procedure Laterality Date  . Tubal ligation    . Cholecystectomy     Social History  Substance Use Topics  . Smoking status: Never Smoker   . Smokeless tobacco: None  . Alcohol Use: Yes   Family History  Problem Relation Age of Onset  . Cancer Mother     Breast Cancer  . Hypertension Father   . Hyperlipidemia Father   . Stroke Maternal Grandmother   . Alcohol abuse Paternal Grandmother   . Heart disease Paternal Grandfather   . Other Brother     lyme   Allergies  Allergen Reactions  . Lamisil [Terbinafine Hcl] Hives and Itching  . Terbinafine Hcl Hives       Past medical history, social, surgical and family history all reviewed in electronic medical record.   Review of Systems: No headache, visual changes, nausea, vomiting, diarrhea, constipation, dizziness, abdominal pain, skin rash, fevers, chills, night sweats, weight loss, swollen lymph nodes, body aches, joint swelling, muscle aches, chest pain, shortness of breath, mood changes.   Objective Blood pressure 118/76, pulse 82, weight 137 lb (62.143 kg).   General: No apparent distress alert and oriented x3 mood and affect normal, dressed appropriately.  HEENT: Pupils equal, extraocular movements intact  Respiratory: Patient's speak in full sentences and does not appear short of breath  Cardiovascular: No lower extremity edema, non tender, no erythema  Skin: Warm dry intact with no signs of infection or rash on extremities or on axial skeleton.  Abdomen: Soft nontender  Neuro: Cranial nerves II through XII are intact, neurovascularly intact in all extremities with 2+ DTRs and 2+ pulses.  Lymph: No lymphadenopathy of posterior or anterior cervical chain or axillae bilaterally.  Gait normal with good balance and coordination.  MSK:  Non tender with full range of motion and good stability and symmetric strength and tone of shoulders, elbows, wrist, hip, knee and ankles bilaterally.  Back Exam:  Inspection: Unremarkable  Motion: Flexion 35 deg  Extension 25 deg, Side Bending to 45 deg bilaterally, Some improvement in function Rotation to 45 deg bilaterally  SLR laying: Negative  XSLR laying: Negative  Palpable tenderness: continuedtenderness over the right sacroiliac joint FABER: Continued pain over the radial tendon as well as seen piriformis tendon on the right side. sacral dermatomes.  Mild scapular dyskinesia noted on the right side Reflexes: 2+ at both patellar tendons, 2+ at achilles tendons, Babinski's downgoing.  Strength at foot  Plantar-flexion: 5/5 Dorsi-flexion: 5/5 Eversion: 5/5 Inversion: 5/5  Leg strength  Quad: 5/5 Hamstring: 5/5 Hip flexor: 5/5 Hip abductors: 4/5 symmetric which is an improvement Gait unremarkable.  Procedure: Real-time Ultrasound Guided Injection of right gluteal tendon sheath Device: GE Logiq E  Ultrasound guided injection is preferred based studies that show increased duration, increased effect, greater accuracy, decreased procedural pain, increased response rate, and decreased cost with ultrasound guided  versus blind injection.  Verbal informed consent obtained.  Time-out conducted.  Noted no overlying erythema, induration, or other signs of local infection.  Skin prepped in a sterile fashion.  Local anesthesia: Topical Ethyl chloride.  With sterile technique and under real time ultrasound guidance:  With a 21-gauge 2 inch needle patient was injected with a total of 2 mL of 0.5% Marcaine. Patient then was injected with 4 mL of PRP mixture that was obtained and labs prior to office visit. Picture saved Completed without difficulty  Pain immediately resolved suggesting accurate placement of the medication.  Advised to call if fevers/chills, erythema, induration, drainage, or persistent bleeding.  Images permanently stored and available for review in the ultrasound unit.  Impression: Technically successful ultrasound guided injection.  Impression and Recommendations:     This case required medical decision making of moderate complexity.

## 2015-07-18 ENCOUNTER — Ambulatory Visit: Payer: 59

## 2015-07-18 DIAGNOSIS — B351 Tinea unguium: Secondary | ICD-10-CM

## 2015-07-18 NOTE — Progress Notes (Signed)
   Subjective:    Patient ID: [name], female    DOB: [birth year], 40 y.o.   MRN: [id number]  HPI Pt presents for laser therapy to right hallux nail   Review of Systems    all other systems negative Objective:   Physical Exam   Yellow discoloration of right hallux nail     Assessment & Plan:  Laser therapy performed to right hallux nail at approximately 100 pulses, tolerated well, nails were debrided before procedure. All safety precautions in place. Re-appointed in 1 month for 2nd laser treatment

## 2015-07-23 MED FILL — VENLAFAXINE HCL ER 75 MG CA: 75 | 30 days supply | Qty: 30 | Fill #2

## 2015-07-23 MED FILL — MELOXICAM 15 MG TABLET: 15 | 90 days supply | Qty: 90 | Fill #1

## 2015-08-02 ENCOUNTER — Ambulatory Visit (INDEPENDENT_AMBULATORY_CARE_PROVIDER_SITE_OTHER): Payer: 59 | Admitting: Family Medicine

## 2015-08-02 ENCOUNTER — Encounter: Payer: Self-pay | Admitting: Family Medicine

## 2015-08-02 VITALS — BP 118/78 | HR 72 | Wt 138.0 lb

## 2015-08-02 DIAGNOSIS — M9905 Segmental and somatic dysfunction of pelvic region: Secondary | ICD-10-CM

## 2015-08-02 DIAGNOSIS — M9904 Segmental and somatic dysfunction of sacral region: Secondary | ICD-10-CM | POA: Diagnosis not present

## 2015-08-02 DIAGNOSIS — M9903 Segmental and somatic dysfunction of lumbar region: Secondary | ICD-10-CM | POA: Diagnosis not present

## 2015-08-02 DIAGNOSIS — M7601 Gluteal tendinitis, right hip: Secondary | ICD-10-CM

## 2015-08-02 DIAGNOSIS — M533 Sacrococcygeal disorders, not elsewhere classified: Secondary | ICD-10-CM

## 2015-08-02 DIAGNOSIS — M5416 Radiculopathy, lumbar region: Secondary | ICD-10-CM | POA: Insufficient documentation

## 2015-08-02 DIAGNOSIS — M67951 Unspecified disorder of synovium and tendon, right thigh: Secondary | ICD-10-CM

## 2015-08-02 DIAGNOSIS — M999 Biomechanical lesion, unspecified: Secondary | ICD-10-CM

## 2015-08-02 MED ORDER — TRAMADOL HCL 50 MG PO TABS: 50.0000 mg | ORAL_TABLET | Freq: Three times a day (TID) | ORAL | Status: DC | PRN

## 2015-08-02 MED ORDER — GABAPENTIN 100 MG PO CAPS: 200.0000 mg | ORAL_CAPSULE | Freq: Every day | ORAL | Status: DC

## 2015-08-02 MED ORDER — VENLAFAXINE HCL ER 150 MG PO CP24: 150.0000 mg | ORAL_CAPSULE | Freq: Every day | ORAL | Status: DC

## 2015-08-02 MED FILL — traMADol HCL 50 MG TABS: 50 | 10 days supply | Qty: 30 | Fill #0

## 2015-08-02 MED FILL — GABAPENTIN 100 MG CAPSULE: 100 | 30 days supply | Qty: 60 | Fill #0

## 2015-08-02 NOTE — Assessment & Plan Note (Signed)
Patient appears to be having worsening symptoms and now positive straight leg test. History of an MRI in 2013 showing an L5-S1 protruding disc. I do believe that this is likely worsening at this time. Patient has failed all conservative therapy including therapy. At this time and do feel that increasing medications as well as advance imaging is warranted. Started her on gabapentin. Follow-up after the MRI.

## 2015-08-02 NOTE — Assessment & Plan Note (Signed)
Patient at this time continues to have some dysfunction but I do believe that there is some lumbar radiculopathy. I do feel that with patient not responding everything including formal physical therapy and injections only minorly I do feel that advance imaging is reported at this time. Patient's MRI is 42 years old and patient is now having a positive straight leg test. Patient given gabapentin that hopefully be beneficial and will increase her Effexor. Tramadol for breakthrough pain. Patient has meloxicam as well. Follow-up with me again after MRI. Continues to respond fairly well to osteopathic manipulation.

## 2015-08-02 NOTE — Assessment & Plan Note (Signed)
Decision today to treat with OMT was based on Physical Exam  After verbal consent patient was treated with HVLA, ME techniques in lumbar sacral and ilium areas  Patient tolerated the procedure well with improvement in symptoms  Patient given exercises, stretches and lifestyle modifications  See medications in patient instructions if given  Patient will follow up in 3-4 weeks

## 2015-08-02 NOTE — Addendum Note (Signed)
Addended by: Douglass Rivers T on: 08/02/2015 10:52 AM   Modules accepted: Orders

## 2015-08-02 NOTE — Assessment & Plan Note (Signed)
Did not respond as well to the PRP injection. We will continue to monitor. At the moment with patient having radicular symptoms we will not increase protocol to start increasing activity.

## 2015-08-02 NOTE — Patient Instructions (Signed)
I am sorry you are still hurting.  Ice is still good  Gabapentin 228m at night Increase Effexor to 1524mdaily Tramadol for breakthrough pain  We will get the MRI and I will write you the results See me again in 3 weeks for more manipulation and discuss next steps on the back/hip as well if needed.

## 2015-08-02 NOTE — Progress Notes (Signed)
[name] [place], [place] [address] Phone: [phone] Subjective:     CC: Low back pain follow up Upper back pain  FWY:OVZCHYIFOY ANASTASHA [name] is a 40 y.o. female coming in with complaint of low back pain. Patient did have an MRI back in 2013 that showed a very small L5-S1 central protrusion but no significant nerve root impingement. Patient states she is given an epidural steroid injection but we did not have any significant improvement.   Patient had been responding very well to osteopathic manipulation  Patient though seemed to plateau for quite some time. Patient then had a sacroiliac injection. Mild improvement as well seemed to be completely resolved for approximate 3 days and then seemed to start coming back. Patient also had more of a gluteal tendinopathy and patient started on PRP. Patient was given an injection but states no significant improvement. Has not been doing the exercises, has had difficulty staying active. Patient feels that work is exacerbating the situation. Having difficulty controlling all aspects of her life.  Has done 2 rounds of formal physical therapy previously for greater than 6 weeks.  Past Medical History  Diagnosis Date  . Allergy   . Hypertension    Past Surgical History  Procedure Laterality Date  . Tubal ligation    . Cholecystectomy     Social History  Substance Use Topics  . Smoking status: Never Smoker   . Smokeless tobacco: None  . Alcohol Use: Yes   Family History  Problem Relation Age of Onset  . Cancer Mother     Breast Cancer  . Hypertension Father   . Hyperlipidemia Father   . Stroke Maternal Grandmother   . Alcohol abuse Paternal Grandmother   . Heart disease Paternal Grandfather   . Other Brother     lyme   Allergies  Allergen Reactions  . Lamisil [Terbinafine Hcl] Hives and Itching  . Terbinafine Hcl Hives       Past medical history, social, surgical and family history  all reviewed in electronic medical record.   Review of Systems: No headache, visual changes, nausea, vomiting, diarrhea, constipation, dizziness, abdominal pain, skin rash, fevers, chills, night sweats, weight loss, swollen lymph nodes, body aches, joint swelling, muscle aches, chest pain, shortness of breath, mood changes.   Objective Blood pressure 118/78, pulse 72, weight 138 lb (62.596 kg).  General: No apparent distress alert and oriented x3 mood and affect normal, dressed appropriately.  HEENT: Pupils equal, extraocular movements intact  Respiratory: Patient's speak in full sentences and does not appear short of breath  Cardiovascular: No lower extremity edema, non tender, no erythema  Skin: Warm dry intact with no signs of infection or rash on extremities or on axial skeleton.  Abdomen: Soft nontender  Neuro: Cranial nerves II through XII are intact, neurovascularly intact in all extremities with 2+ DTRs and 2+ pulses.  Lymph: No lymphadenopathy of posterior or anterior cervical chain or axillae bilaterally.  Gait normal with good balance and coordination.  MSK:  Non tender with full range of motion and good stability and symmetric strength and tone of shoulders, elbows, wrist, hip, knee and ankles bilaterally.  Back Exam:  Inspection: Unremarkable  Motion: Flexion 35 deg  Extension 15 deg, Side Bending to 45 deg bilaterally, Rotation to 45 deg bilaterally  SLR laying: positive straight leg test on the right side which is new XSLR laying: Negative  Palpable tenderness: continued tenderness in the right paraspinal musculature  of the lumbar spine and worsening pain over the right sacroiliac joint FABER: Continued pain over the radial tendon as well as seen piriformis tendon on the right side. Mild scapular dyskinesia noted on the right side Reflexes: 2+ at both patellar tendons, 2+ at achilles tendons, Babinski's downgoing.  Strength at foot  Plantar-flexion: 5/5 Dorsi-flexion: 5/5  Eversion: 5/5 Inversion: 5/5  Leg strength  Quad: 5/5 Hamstring: 5/5 Hip flexor: 5/5 Hip abductors: on right side only. Gait unremarkable.  Osteopathic findings T3 extended rotated and side bent right L2 flexed rotated and side bent left Right anterior ilium Sacrum left on left   Impression and Recommendations:     This case required medical decision making of moderate complexity.

## 2015-08-11 ENCOUNTER — Ambulatory Visit
Admission: RE | Admit: 2015-08-11 | Discharge: 2015-08-11 | Disposition: A | Discharge status: Home / Self Care | Payer: 59 | Source: Ambulatory Visit | Attending: Family Medicine | Admitting: Family Medicine

## 2015-08-11 DIAGNOSIS — M4806 Spinal stenosis, lumbar region: Secondary | ICD-10-CM | POA: Diagnosis not present

## 2015-08-11 DIAGNOSIS — M5416 Radiculopathy, lumbar region: Secondary | ICD-10-CM

## 2015-08-12 ENCOUNTER — Encounter: Payer: Self-pay | Admitting: Family Medicine

## 2015-08-12 DIAGNOSIS — M5416 Radiculopathy, lumbar region: Secondary | ICD-10-CM

## 2015-08-13 ENCOUNTER — Telehealth: Payer: Self-pay

## 2015-08-13 NOTE — Telephone Encounter (Signed)
Patient called and said she wanted you to understand that she is saying YES to the injections. She would be fine with that. And as soon as you can do it. Thank you.

## 2015-08-13 NOTE — Telephone Encounter (Signed)
Scheduled for 08/21/15 for epidural

## 2015-08-14 ENCOUNTER — Encounter: Payer: Self-pay | Admitting: Internal Medicine

## 2015-08-14 DIAGNOSIS — N281 Cyst of kidney, acquired: Secondary | ICD-10-CM

## 2015-08-16 ENCOUNTER — Telehealth: Payer: Self-pay | Admitting: *Deleted

## 2015-08-16 NOTE — Telephone Encounter (Signed)
Pt left msg on triage stating she had spoke w/MD about refilling her Nitrofurantoin to take after post inter-coarse.Marland KitchenJohny Chess

## 2015-08-17 MED ORDER — NITROFURANTOIN MACROCRYSTAL 50 MG PO CAPS
ORAL_CAPSULE | ORAL | Status: DC
Start: 2015-08-17 — End: 2016-09-28

## 2015-08-17 NOTE — Telephone Encounter (Signed)
Sent to pharmacy 

## 2015-08-19 MED FILL — VENLAFAXINE HCL ER 150 MG C: 150 | 30 days supply | Qty: 30 | Fill #0

## 2015-08-19 MED FILL — NITROFURANTOIN MCR 50 MG CA: 50 | 45 days supply | Qty: 45 | Fill #0

## 2015-08-19 NOTE — Telephone Encounter (Signed)
Called pt no answer LMOM md sent rx to pharmacy...[name]

## 2015-08-21 ENCOUNTER — Ambulatory Visit: Payer: 59

## 2015-08-21 ENCOUNTER — Ambulatory Visit
Admission: RE | Admit: 2015-08-21 | Discharge: 2015-08-21 | Disposition: A | Discharge status: Home / Self Care | Payer: 59 | Source: Ambulatory Visit | Attending: Family Medicine | Admitting: Family Medicine

## 2015-08-21 DIAGNOSIS — B351 Tinea unguium: Secondary | ICD-10-CM

## 2015-08-21 DIAGNOSIS — M5416 Radiculopathy, lumbar region: Secondary | ICD-10-CM

## 2015-08-21 MED ORDER — IOPAMIDOL (ISOVUE-M 200) INJECTION 41%
1.0000 mL | Freq: Once | INTRAMUSCULAR | Status: AC
  Administered 2015-08-21: 1 mL via EPIDURAL

## 2015-08-21 MED ORDER — METHYLPREDNISOLONE ACETATE 40 MG/ML INJ SUSP (RADIOLOG
120.0000 mg | Freq: Once | INTRAMUSCULAR | Status: AC
  Administered 2015-08-21: 120 mg via EPIDURAL

## 2015-08-21 NOTE — Progress Notes (Signed)
   Subjective:    Patient ID: [name], female    DOB: 11/07/1975, 40 y.o.   MRN: [id number]  HPI Pt presents for laser therapy to right hallux nail   Review of Systems    all other systems negative Objective:   Physical Exam   Yellow discoloration of right hallux nail     Assessment & Plan:  Laser therapy performed to right hallux nail at approximately 100 pulses, tolerated well, nails were debrided before procedure. All safety precautions in place. Re-appointed in 2 months for 3rd and final laser treatment

## 2015-08-21 NOTE — Discharge Instructions (Signed)

## 2015-08-23 ENCOUNTER — Encounter: Payer: Self-pay | Admitting: Family Medicine

## 2015-08-23 ENCOUNTER — Ambulatory Visit (INDEPENDENT_AMBULATORY_CARE_PROVIDER_SITE_OTHER): Payer: 59 | Admitting: Family Medicine

## 2015-08-23 VITALS — BP 142/88 | HR 85 | Ht 63.0 in | Wt 135.0 lb

## 2015-08-23 DIAGNOSIS — M9902 Segmental and somatic dysfunction of thoracic region: Secondary | ICD-10-CM

## 2015-08-23 DIAGNOSIS — I10 Essential (primary) hypertension: Secondary | ICD-10-CM | POA: Diagnosis not present

## 2015-08-23 DIAGNOSIS — M999 Biomechanical lesion, unspecified: Secondary | ICD-10-CM | POA: Insufficient documentation

## 2015-08-23 DIAGNOSIS — M5416 Radiculopathy, lumbar region: Secondary | ICD-10-CM | POA: Diagnosis not present

## 2015-08-23 MED ORDER — AMLODIPINE BESYLATE 5 MG PO TABS: 5.0000 mg | ORAL_TABLET | Freq: Every day | ORAL | Status: DC

## 2015-08-23 MED FILL — AMLODIPINE BESYLATE 5 MG TA: 5 | 90 days supply | Qty: 90 | Fill #0

## 2015-08-23 NOTE — Assessment & Plan Note (Signed)
Increased amlodipine 5 mg. Could be secondary to the Effexor and would like to titrate down hopefully at next follow-up.

## 2015-08-23 NOTE — Patient Instructions (Signed)
Good to see you Ice is your friend I am so happy you are doing better.  We will increase the norvasc to 67m daily Keep doing the exercises.   See me again in 4-5 weeks!

## 2015-08-23 NOTE — Progress Notes (Signed)
Pre visit review using our clinic review tool, if applicable. No additional management support is needed unless otherwise documented below in the visit note. 

## 2015-08-23 NOTE — Assessment & Plan Note (Signed)
Patient responded very well to the injection. I think the patient is doing well with her other medications. We did find the patient did have some hypertension and we will increase her medications. We discussed continuing the Effexor at this time but hopefully with patient having decreasing radicular symptoms we'll be able to titrate down. Follow-up again in 4-5 weeks with her starting to increase her activity with an exercise prescription in the interim.

## 2015-08-23 NOTE — Progress Notes (Signed)
[name], [place] [address] Phone: [phone] Subjective:     CC: Low back pain follow up Upper back pain  [name] is a 40 y.o. female coming in with complaint of low back pain. Patient did have an MRI back in 2013 that showed a very small L5-S1 central protrusion but no significant nerve root impingement. Patient states she is given an epidural steroid injection but we did not have any significant improvement.   Patient was having worsening symptoms. Not responding to any other injections in the gluteal injection as well as a sacroiliac joint injection. Patient was sent for an epidural in the back after an MRI showed nerve root impingement consistent with her symptoms. Patient states that it is improved. Patient states every day it seems to be less and less tightened less pain. Less radicular symptoms as well. Overall fairly happy with the results.    Past Medical History  Diagnosis Date  . Allergy   . Hypertension    Past Surgical History  Procedure Laterality Date  . Tubal ligation    . Cholecystectomy     Social History  Substance Use Topics  . Smoking status: Never Smoker   . Smokeless tobacco: Not on file  . Alcohol Use: Yes   Family History  Problem Relation Age of Onset  . Cancer Mother     Breast Cancer  . Hypertension Father   . Hyperlipidemia Father   . Stroke Maternal Grandmother   . Alcohol abuse Paternal Grandmother   . Heart disease Paternal Grandfather   . Other Brother     lyme   Allergies  Allergen Reactions  . Lamisil [Terbinafine Hcl] Hives and Itching  . Terbinafine Hcl Hives       Past medical history, social, surgical and family history all reviewed in electronic medical record.   Review of Systems: No headache, visual changes, nausea, vomiting, diarrhea, constipation, dizziness, abdominal pain, skin rash, fevers, chills, night sweats, weight loss, swollen lymph  nodes, body aches, joint swelling, muscle aches, chest pain, shortness of breath, mood changes.   Objective Blood pressure 142/88, pulse 85, weight 135 lb (61.236 kg), SpO2 98 %.  General: No apparent distress alert and oriented x3 mood and affect normal, dressed appropriately.  HEENT: Pupils equal, extraocular movements intact  Respiratory: Patient's speak in full sentences and does not appear short of breath  Cardiovascular: No lower extremity edema, non tender, no erythema  Skin: Warm dry intact with no signs of infection or rash on extremities or on axial skeleton.  Abdomen: Soft nontender  Neuro: Cranial nerves II through XII are intact, neurovascularly intact in all extremities with 2+ DTRs and 2+ pulses.  Lymph: No lymphadenopathy of posterior or anterior cervical chain or axillae bilaterally.  Gait normal with good balance and coordination.  MSK:  Non tender with full range of motion and good stability and symmetric strength and tone of shoulders, elbows, wrist, hip, knee and ankles bilaterally.  Back Exam:  Inspection: Unremarkable  Motion: Flexion 35 deg  Extension 15 deg, Side Bending to 45 deg bilaterally, Rotation to 45 deg bilaterally  SLR laying: Negative XSLR laying: Negative  Palpable tenderness: Significant less tenderness to palpation than previous exam in the paraspinal musculature. FABER: Improvement with range of motion but still some tightness on the right side Mild scapular dyskinesia noted on the right side Reflexes: 2+ at both patellar tendons, 2+ at achilles tendons, Babinski's downgoing.  Strength at foot  Plantar-flexion: 5/5 Dorsi-flexion: 5/5 Eversion: 5/5 Inversion: 5/5  Leg strength  Quad: 5/5 Hamstring: 5/5 Hip flexor: 5/5 Hip abductors: on right side only. Gait unremarkable.  Osteopathic findings T3 extended rotated and side bent right    Impression and Recommendations:     This case required medical decision making of moderate  complexity.

## 2015-08-23 NOTE — Assessment & Plan Note (Signed)
Decision today to treat with OMT was based on Physical Exam  After verbal consent patient was treated with HVLA techniques in thoracic areas  Patient tolerated the procedure well with improvement in symptoms  Patient given exercises, stretches and lifestyle modifications  See medications in patient instructions if given  Patient will follow up in 4-5 weeks

## 2015-09-02 ENCOUNTER — Ambulatory Visit
Admission: RE | Admit: 2015-09-02 | Discharge: 2015-09-02 | Disposition: A | Discharge status: Home / Self Care | Payer: 59 | Source: Ambulatory Visit | Attending: Internal Medicine | Admitting: Internal Medicine

## 2015-09-02 DIAGNOSIS — N281 Cyst of kidney, acquired: Secondary | ICD-10-CM

## 2015-09-02 DIAGNOSIS — N2889 Other specified disorders of kidney and ureter: Secondary | ICD-10-CM | POA: Diagnosis not present

## 2015-09-03 ENCOUNTER — Encounter: Payer: Self-pay | Admitting: Internal Medicine

## 2015-09-14 ENCOUNTER — Encounter: Payer: Self-pay | Admitting: Family Medicine

## 2015-09-14 ENCOUNTER — Ambulatory Visit (HOSPITAL_COMMUNITY)
Admission: RE | Admit: 2015-09-14 | Discharge: 2015-09-14 | Disposition: A | Discharge status: Home / Self Care | Payer: 59 | Source: Ambulatory Visit | Attending: Family Medicine | Admitting: Family Medicine

## 2015-09-14 ENCOUNTER — Ambulatory Visit (INDEPENDENT_AMBULATORY_CARE_PROVIDER_SITE_OTHER): Payer: 59 | Admitting: Family Medicine

## 2015-09-14 VITALS — BP 124/90 | HR 92 | Temp 97.9°F | Ht 63.0 in | Wt 138.2 lb

## 2015-09-14 DIAGNOSIS — R221 Localized swelling, mass and lump, neck: Secondary | ICD-10-CM | POA: Diagnosis not present

## 2015-09-14 DIAGNOSIS — R05 Cough: Secondary | ICD-10-CM | POA: Diagnosis not present

## 2015-09-14 DIAGNOSIS — R059 Cough, unspecified: Secondary | ICD-10-CM

## 2015-09-14 MED ORDER — PREDNISONE 20 MG PO TABS: ORAL_TABLET | ORAL | Status: DC

## 2015-09-14 NOTE — Progress Notes (Signed)
Pre visit review using our clinic review tool, if applicable. No additional management support is needed unless otherwise documented below in the visit note. 

## 2015-09-14 NOTE — Assessment & Plan Note (Signed)
Of 3 wks duration, started after beach trip. No signs of bronchitis or other infectious cause.  No wheezing pointing against asthmatic cause. Anticipate allergic cough - treat with prednisone 1 wk taper and continue allegra. If no better, discussed possible GERD (she does have significant NSAID use) and rec start prilosec OTC 20m daily x 3 wks. Update if not improved. CXR today given duration of cough. Pt agrees with plan.

## 2015-09-14 NOTE — Progress Notes (Signed)
BP 124/90 mmHg  Pulse 92  Temp(Src) 97.9 F (36.6 C) (Oral)  Ht 5' 3"  (1.6 m)  Wt 138 lb 4 oz (62.71 kg)  BMI 24.50 kg/m2  SpO2 97%   CC: cough  Subjective:    Patient ID: [name], female    DOB: 06-Sep-1975, 40 y.o.   MRN: [id number]  HPI: [name] is a 40 y.o. female presenting on 09/14/2015 for Cough   Several week h/o nonproductive cough worse at night when laying down, associated with mild chest tightness and some wheezing, not relieved with mucinex D. Never URI sxs with this. Cough started at the beach. Laying down feels sense of fullness in throat.   H/o allergic rhinitis. Some PNDrainage. Not improved despite allegra and floase. Also taking ibuprofen.   H/o this in the past after bronchitis.   H/o asthmatic bronchitis.  No sick contacts at home. No smokers at home.  No h/o GERD.   No HRT. No recent prolonged car or plane rides - she did take breaks on trip to beach. No recent leg swelling.   Relevant past medical, surgical, family and social history reviewed and updated as indicated. Interim medical history since our last visit reviewed. Allergies and medications reviewed and updated. Current Outpatient Prescriptions on File Prior to Visit  Medication Sig  . acetaminophen (TYLENOL) 650 MG CR tablet Take 650 mg by mouth every 8 (eight) hours as needed for pain.  Marland Kitchen amLODipine (NORVASC) 5 MG tablet Take 1 tablet (5 mg total) by mouth daily.  Marland Kitchen gabapentin (NEURONTIN) 100 MG capsule Take 2 capsules (200 mg total) by mouth at bedtime.  Marland Kitchen ibuprofen (ADVIL,MOTRIN) 200 MG tablet Take 200 mg by mouth. Reported on 04/05/2015  . meloxicam (MOBIC) 15 MG tablet Take 1 tablet (15 mg total) by mouth daily.  . nitrofurantoin (MACRODANTIN) 50 MG capsule Take after intercourse  . traMADol (ULTRAM) 50 MG tablet Take 1 tablet (50 mg total) by mouth every 8 (eight) hours as needed.  . venlafaxine XR (EFFEXOR-XR) 150 MG 24 hr capsule Take 1 capsule (150 mg total) by mouth daily  with breakfast.   No current facility-administered medications on file prior to visit.    Review of Systems Per HPI unless specifically indicated in ROS section     Objective:    BP 124/90 mmHg  Pulse 92  Temp(Src) 97.9 F (36.6 C) (Oral)  Ht 5' 3"  (1.6 m)  Wt 138 lb 4 oz (62.71 kg)  BMI 24.50 kg/m2  SpO2 97%  Wt Readings from Last 3 Encounters:  09/14/15 138 lb 4 oz (62.71 kg)  08/23/15 135 lb (61.236 kg)  08/02/15 138 lb (62.596 kg)    Physical Exam  Constitutional: She appears well-developed and well-nourished. No distress.  HENT:  Head: Normocephalic and atraumatic.  Right Ear: Hearing, tympanic membrane, external ear and ear canal normal.  Left Ear: Hearing, tympanic membrane, external ear and ear canal normal.  Nose: No mucosal edema or rhinorrhea. Right sinus exhibits no maxillary sinus tenderness and no frontal sinus tenderness. Left sinus exhibits no maxillary sinus tenderness and no frontal sinus tenderness.  Mouth/Throat: Uvula is midline, oropharynx is clear and moist and mucous membranes are normal. No oropharyngeal exudate, posterior oropharyngeal edema, posterior oropharyngeal erythema or tonsillar abscesses.  Eyes: Conjunctivae and EOM are normal. Pupils are equal, round, and reactive to light. No scleral icterus.  Neck: Normal range of motion. Neck supple. No thyromegaly present.  R neck fullness at [facility] without tenderness,  erythema or warmth  Cardiovascular: Normal rate, regular rhythm, normal heart sounds and intact distal pulses.   No murmur heard. Pulmonary/Chest: Effort normal and breath sounds normal. No respiratory distress. She has no wheezes. She has no rales.  Lungs clear   Lymphadenopathy:    She has no cervical adenopathy.  Skin: Skin is warm and dry. No rash noted.  Nursing note and vitals reviewed.  Lab Results  Component Value Date   TSH 0.96 05/23/2015       Assessment & Plan:   Problem List Items Addressed This Visit    Cough -  Primary    Of 3 wks duration, started after beach trip. No signs of bronchitis or other infectious cause.  No wheezing pointing against asthmatic cause. Anticipate allergic cough - treat with prednisone 1 wk taper and continue allegra. If no better, discussed possible GERD (she does have significant NSAID use) and rec start prilosec OTC 77m daily x 3 wks. Update if not improved. CXR today given duration of cough. Pt agrees with plan.      Relevant Orders   DG Chest 2 View   Fullness of neck    New - pt had not noticed previously - will order neck [place] eval for goiter, lipoma. Pt agrees with plan.       Relevant Orders   [place] Tissue Head/Neck       Follow up plan: Return if symptoms worsen or fail to improve.  JRia Bush MD

## 2015-09-14 NOTE — Patient Instructions (Signed)
Possible allergic component - treat with prednisone course x 1 week. If no better, start prilosec OTC 75m daily.  Pass by [facility] for chest xray today. We will also order neck ultrasound in the next 1-2 wks and call you to schedule

## 2015-09-14 NOTE — Assessment & Plan Note (Signed)
New - pt had not noticed previously - will order neck US to eval for goiter, lipoma. Pt agrees with plan.

## 2015-09-17 MED FILL — VENLAFAXINE HCL ER 150 MG C: 150 | 30 days supply | Qty: 30 | Fill #1

## 2015-09-18 MED FILL — GABAPENTIN 100 MG CAPSULE: 100 | 30 days supply | Qty: 60 | Fill #1

## 2015-09-23 ENCOUNTER — Ambulatory Visit
Admission: RE | Admit: 2015-09-23 | Discharge: 2015-09-23 | Disposition: A | Discharge status: Home / Self Care | Payer: 59 | Source: Ambulatory Visit | Attending: Family Medicine | Admitting: Family Medicine

## 2015-09-23 DIAGNOSIS — R221 Localized swelling, mass and lump, neck: Secondary | ICD-10-CM

## 2015-09-23 DIAGNOSIS — M542 Cervicalgia: Secondary | ICD-10-CM | POA: Diagnosis not present

## 2015-10-07 NOTE — Progress Notes (Deleted)
[name] [address] [place], [place] [address] Phone: [phone] Subjective:     CC: Low back pain follow up Upper back pain  [id number]  [name] is a 40 y.o. female coming in with complaint of low back pain.  Patient was having worsening symptoms. Not responding to any other injections in the gluteal injection as well as a sacroiliac joint injection. Patient was sent for an epidural in the back after an MRI showed nerve root impingement consistent with her symptoms. Patient states that it is improved. Patient states every day it seems to be less and less tightened less pain. Less radicular symptoms as well.   In tenderness in the upper back and responded well to osteopathic manipulation. Also feels that patient's Effexor has been beneficial for many different things. Patient states  Past Medical History:  Diagnosis Date  . Allergy   . Hypertension    Past Surgical History:  Procedure Laterality Date  . CHOLECYSTECTOMY    . TUBAL LIGATION     Social History  Substance Use Topics  . Smoking status: Never Smoker  . Smokeless tobacco: Not on file  . Alcohol use Yes   Family History  Problem Relation Age of Onset  . Cancer Mother     Breast Cancer  . Hypertension Father   . Hyperlipidemia Father   . Stroke Maternal Grandmother   . Alcohol abuse Paternal Grandmother   . Heart disease Paternal Grandfather   . Other Brother     lyme   Allergies  Allergen Reactions  . Lamisil [Terbinafine Hcl] Hives and Itching  . Terbinafine Hcl Hives       Past medical history, social, surgical and family history all reviewed in electronic medical record.   Review of Systems: No headache, visual changes, nausea, vomiting, diarrhea, constipation, dizziness, abdominal pain, skin rash, fevers, chills, night sweats, weight loss, swollen lymph nodes, body aches, joint swelling, muscle aches, chest pain, shortness of breath, mood changes.    Objective  There were no vitals taken for this visit.  General: No apparent distress alert and oriented x3 mood and affect normal, dressed appropriately.  HEENT: Pupils equal, extraocular movements intact  Respiratory: Patient's speak in full sentences and does not appear short of breath  Cardiovascular: No lower extremity edema, non tender, no erythema  Skin: Warm dry intact with no signs of infection or rash on extremities or on axial skeleton.  Abdomen: Soft nontender  Neuro: Cranial nerves II through XII are intact, neurovascularly intact in all extremities with 2+ DTRs and 2+ pulses.  Lymph: No lymphadenopathy of posterior or anterior cervical chain or axillae bilaterally.  Gait normal with good balance and coordination.  MSK:  Non tender with full range of motion and good stability and symmetric strength and tone of shoulders, elbows, wrist, hip, knee and ankles bilaterally.  Back Exam:  Inspection: Unremarkable  Motion: Flexion 35 deg  Extension 15 deg, Side Bending to 45 deg bilaterally, Rotation to 45 deg bilaterally  SLR laying: Negative XSLR laying: Negative  Palpable tenderness: Significant less tenderness to palpation than previous exam in the paraspinal musculature. FABER: Improvement with range of motion but still some tightness on the right side Mild scapular dyskinesia noted on the right side Reflexes: 2+ at both patellar tendons, 2+ at achilles tendons, Babinski's downgoing.  Strength at foot  Plantar-flexion: 5/5 Dorsi-flexion: 5/5 Eversion: 5/5 Inversion: 5/5  Leg strength  Quad: 5/5 Hamstring: 5/5 Hip flexor: 5/5 Hip  abductors: on right side only. Gait unremarkable.  Osteopathic findings T3 extended rotated and side bent right    Impression and Recommendations:     This case required medical decision making of moderate complexity.

## 2015-10-08 ENCOUNTER — Ambulatory Visit: Payer: 59 | Admitting: Family Medicine

## 2015-10-08 NOTE — Assessment & Plan Note (Deleted)
Decision today to treat with OMT was based on Physical Exam  After verbal consent patient was treated with HVLA techniques in thoracic areas  Patient tolerated the procedure well with improvement in symptoms  Patient given exercises, stretches and lifestyle modifications  See medications in patient instructions if given  Patient will follow up in 6-8 weeks

## 2015-10-08 NOTE — Assessment & Plan Note (Deleted)
No longer having radicular symptoms.  Responded to epidural in June.  HEP encouraged, core strength Continue to respond to OMT.  Discussed shoes, continue meds.  Discussed possibility decrease effexor.  RTC in 6-8 weeks.

## 2015-10-12 NOTE — Progress Notes (Signed)
[name] [address] [place], [place] [address] Phone: [phone] Subjective:     CC: Low back pain follow up Upper back pain Right knee pain  [id number]  [name] is a 40 y.o. female coming in with complaint of low back pain.  Patient was having worsening symptoms. Not responding to any other injections in the gluteal injection as well as a sacroiliac joint injection. Patient was sent for an epidural in the back after an MRI showed nerve root impingement consistent with her symptoms. Patient states that it is improved. Patient states every day it seems to be less and less tightened less pain. Less radicular symptoms as well.   In tenderness in the upper back and responded well to osteopathic manipulation. Also feels that patient's Effexor has been beneficial for many different things. Patient states overall she seems to be doing well. No side effects to any of the medications. Patient is happy with the results. Has lost some weight.  Patient is complaining of some mild right knee pain.   Has had difficult he with patellofemoral syndrome. Was at the beach in [place] for multiple hours. Patient noticed that then when she got home to step and had some pain on the lateral aspect of the right knee. States that since then sometimes going up or downstairs gives her some discomfort. No pain at rest. Difficulty standing for long amount of time.  Past Medical History:  Diagnosis Date  . Allergy   . Hypertension    Past Surgical History:  Procedure Laterality Date  . CHOLECYSTECTOMY    . TUBAL LIGATION     Social History  Substance Use Topics  . Smoking status: Never Smoker  . Smokeless tobacco: Not on file  . Alcohol use Yes   Family History  Problem Relation Age of Onset  . Cancer Mother     Breast Cancer  . Hypertension Father   . Hyperlipidemia Father   . Stroke Maternal Grandmother   . Alcohol abuse Paternal  Grandmother   . Heart disease Paternal Grandfather   . Other Brother     lyme   Allergies  Allergen Reactions  . Lamisil [Terbinafine Hcl] Hives and Itching  . Terbinafine Hcl Hives       Past medical history, social, surgical and family history all reviewed in electronic medical record.   Review of Systems: No headache, visual changes, nausea, vomiting, diarrhea, constipation, dizziness, abdominal pain, skin rash, fevers, chills, night sweats, weight loss, swollen lymph nodes, body aches, joint swelling, muscle aches, chest pain, shortness of breath, mood changes.   Objective  Blood pressure 124/82, pulse (!) 103, weight 140 lb (63.5 kg), SpO2 97 %.  General: No apparent distress alert and oriented x3 mood and affect normal, dressed appropriately.  HEENT: Pupils equal, extraocular movements intact  Respiratory: Patient's speak in full sentences and does not appear short of breath  Cardiovascular: No lower extremity edema, non tender, no erythema  Skin: Warm dry intact with no signs of infection or rash on extremities or on axial skeleton.  Abdomen: Soft nontender  Neuro: Cranial nerves II through XII are intact, neurovascularly intact in all extremities with 2+ DTRs and 2+ pulses.  Lymph: No lymphadenopathy of posterior or anterior cervical chain or axillae bilaterally.  Gait normal with good balance and coordination.  MSK:  Non tender with full range of motion and good stability and symmetric strength and tone of shoulders, elbows,  wrist, hip, and ankles bilaterally.  Back Exam:  Inspection: Unremarkable  Motion: Flexion 35 deg  Extension 15 deg, Side Bending to 45 deg bilaterally, Rotation to 45 deg bilaterally  SLR laying: Negative XSLR laying: Negative  Palpable tenderness:  tenderness FABER:  negative Mild scapular dyskinesia noted on the right side Reflexes: 2+ at both patellar tendons, 2+ at achilles tendons, Babinski's downgoing.  Strength at foot  Plantar-flexion:  5/5 Dorsi-flexion: 5/5 Eversion: 5/5 Inversion: 5/5  Leg strength  Quad: 5/5 Hamstring: 5/5 Hip flexor: 5/5 Hip abductors: on right side only. Gait unremarkable. Knee:Right Normal to inspection with no erythema or effusion or obvious bony abnormalities. Mild pain over the distal iliotibial band but negative Faber ROM full in flexion and extension and lower leg rotation. Ligaments with solid consistent endpoints including ACL, PCL, LCL, MCL. Negative Mcmurray's, Apley's, and Thessalonian tests. Non painful patellar compression. Patellar glide  With minimalcrepitus. Patellar and quadriceps tendons unremarkable. Hamstring and quadriceps strength is normal.   contralateral knee unremarkable  Osteopathic findings T3 extended rotated and side bent right T5 extended rotated and side bent left L2 flexed rotated and side bent right Sacrum right on right    Impression and Recommendations:     This case required medical decision making of moderate complexity.

## 2015-10-14 ENCOUNTER — Encounter: Payer: Self-pay | Admitting: Family Medicine

## 2015-10-14 ENCOUNTER — Ambulatory Visit (INDEPENDENT_AMBULATORY_CARE_PROVIDER_SITE_OTHER): Payer: 59 | Admitting: Family Medicine

## 2015-10-14 VITALS — BP 124/82 | HR 103 | Wt 140.0 lb

## 2015-10-14 DIAGNOSIS — S76111A Strain of right quadriceps muscle, fascia and tendon, initial encounter: Secondary | ICD-10-CM

## 2015-10-14 DIAGNOSIS — M9905 Segmental and somatic dysfunction of pelvic region: Secondary | ICD-10-CM

## 2015-10-14 DIAGNOSIS — M533 Sacrococcygeal disorders, not elsewhere classified: Secondary | ICD-10-CM

## 2015-10-14 DIAGNOSIS — M9904 Segmental and somatic dysfunction of sacral region: Secondary | ICD-10-CM

## 2015-10-14 DIAGNOSIS — M9903 Segmental and somatic dysfunction of lumbar region: Secondary | ICD-10-CM

## 2015-10-14 DIAGNOSIS — M9902 Segmental and somatic dysfunction of thoracic region: Secondary | ICD-10-CM

## 2015-10-14 DIAGNOSIS — S76119A Strain of unspecified quadriceps muscle, fascia and tendon, initial encounter: Secondary | ICD-10-CM | POA: Insufficient documentation

## 2015-10-14 DIAGNOSIS — M999 Biomechanical lesion, unspecified: Secondary | ICD-10-CM

## 2015-10-14 MED FILL — VENLAFAXINE HCL ER 150 MG C: 150 | 30 days supply | Qty: 30 | Fill #2

## 2015-10-14 NOTE — Assessment & Plan Note (Addendum)
Seems to be doing very well. We discussed icing regimen, home exercise, patient seems to be strengthening her core. Do not see any significant findings A concern for any more lumbar radiculopathy. Patient will continue with conservative therapy and see me in 4-6 weeks.

## 2015-10-14 NOTE — Assessment & Plan Note (Signed)
Patient does have more of a muscle strain. We discussed icing regimen and home exercises. Patient will try a thigh-high sleeve. Patient will see how she does. At follow-up if worsening symptoms consider ultrasound.

## 2015-10-14 NOTE — Patient Instructions (Signed)
You are doing great  Keep it up with the exercises.  No change in the medicines.  I am happy with your blood pressure.  For the knee a compression sleeve to the thigh or patellar strap over the quad tendon could help Exercises 3 times a week.  No hills or incline with exercise for now See me again in 4-6 weeks.

## 2015-10-14 NOTE — Assessment & Plan Note (Signed)
Decision today to treat with OMT was based on Physical Exam  After verbal consent patient was treated with HVLA techniques in thoracic, lumbar and sacral areas  Patient tolerated the procedure well with improvement in symptoms  Patient given exercises, stretches and lifestyle modifications  See medications in patient instructions if given  Patient will follow up in 4-6 weeks

## 2015-10-28 ENCOUNTER — Other Ambulatory Visit: Payer: 59

## 2015-11-04 ENCOUNTER — Ambulatory Visit (INDEPENDENT_AMBULATORY_CARE_PROVIDER_SITE_OTHER): Payer: 59

## 2015-11-04 DIAGNOSIS — B351 Tinea unguium: Secondary | ICD-10-CM

## 2015-11-04 NOTE — Progress Notes (Signed)
   Subjective:    Patient ID: [name], female    DOB: 11/19/1975, 40 y.o.   MRN: [id number]  HPI Pt presents for laser therapy to right hallux nail   Review of Systems    all other systems negative Objective:   Physical Exam   Yellow discoloration of right hallux nail, clearing to 60%     Assessment & Plan:  Laser therapy performed to right hallux nail at approximately 1000 pulses, tolerated well, nails were debrided before procedure. All safety precautions in place.

## 2015-11-12 MED FILL — AMLODIPINE BESYLATE 5 MG TA: 5 | 90 days supply | Qty: 90 | Fill #1

## 2015-11-12 MED FILL — GABAPENTIN 100 MG CAPSULE: 100 | 30 days supply | Qty: 60 | Fill #2

## 2015-11-12 MED FILL — VENLAFAXINE HCL ER 150 MG C: 150 | 30 days supply | Qty: 30 | Fill #3

## 2015-11-15 ENCOUNTER — Ambulatory Visit (INDEPENDENT_AMBULATORY_CARE_PROVIDER_SITE_OTHER): Payer: 59

## 2015-11-15 ENCOUNTER — Encounter: Payer: Self-pay | Admitting: Podiatry

## 2015-11-15 ENCOUNTER — Ambulatory Visit (INDEPENDENT_AMBULATORY_CARE_PROVIDER_SITE_OTHER): Payer: 59 | Admitting: Podiatry

## 2015-11-15 VITALS — BP 126/89 | HR 87 | Resp 16

## 2015-11-15 DIAGNOSIS — M79672 Pain in left foot: Secondary | ICD-10-CM

## 2015-11-15 DIAGNOSIS — M779 Enthesopathy, unspecified: Secondary | ICD-10-CM

## 2015-11-15 DIAGNOSIS — B351 Tinea unguium: Secondary | ICD-10-CM

## 2015-11-15 DIAGNOSIS — M79671 Pain in right foot: Secondary | ICD-10-CM

## 2015-11-17 NOTE — Progress Notes (Signed)
Subjective:     Patient ID: [name], female   DOB: May 19, 1975, 40 y.o.   MRN: [id number]  HPI patient states I need new inserts and my feet get tired and sore   Review of Systems     Objective:   Physical Exam Neurovascular status intact with depression of the arch noted bilateral and chronic inflammation of the feet especially with activity and standing    Assessment:     Chronic tendinitis-like symptomatology    Plan:     H&P x-rays reviewed and today I recommended orthotics and scanned for customized orthotic devices. Instructed on usage and patient will have these dispensed when they are returned  X-ray report indicates that there is moderate depression of the arch with no indication stress fracture or other pathology

## 2015-11-22 ENCOUNTER — Ambulatory Visit (INDEPENDENT_AMBULATORY_CARE_PROVIDER_SITE_OTHER): Payer: 59 | Admitting: Family Medicine

## 2015-11-22 ENCOUNTER — Encounter: Payer: Self-pay | Admitting: Family Medicine

## 2015-11-22 VITALS — BP 122/82 | HR 91 | Wt 139.0 lb

## 2015-11-22 DIAGNOSIS — M533 Sacrococcygeal disorders, not elsewhere classified: Secondary | ICD-10-CM

## 2015-11-22 DIAGNOSIS — M9903 Segmental and somatic dysfunction of lumbar region: Secondary | ICD-10-CM | POA: Diagnosis not present

## 2015-11-22 DIAGNOSIS — M9904 Segmental and somatic dysfunction of sacral region: Secondary | ICD-10-CM

## 2015-11-22 DIAGNOSIS — M9905 Segmental and somatic dysfunction of pelvic region: Secondary | ICD-10-CM

## 2015-11-22 DIAGNOSIS — M999 Biomechanical lesion, unspecified: Secondary | ICD-10-CM

## 2015-11-22 DIAGNOSIS — M9902 Segmental and somatic dysfunction of thoracic region: Secondary | ICD-10-CM | POA: Diagnosis not present

## 2015-11-22 NOTE — Assessment & Plan Note (Signed)
Very impressive patient at this time. I do not feel that any further changes in regimen will be needed. We did discuss formal physical therapy again which patient declined. Patient continue with home exercises and icing pedicle. Patient will continue to work on core and hip abductor strength. Follow-up again in 6-8 weeks.

## 2015-11-22 NOTE — Patient Instructions (Signed)
Making it routine Hip abductors and core No gross changes You are doing great  6-8 weeks.

## 2015-11-22 NOTE — Assessment & Plan Note (Signed)
Decision today to treat with OMT was based on Physical Exam  After verbal consent patient was treated with HVLA techniques in thoracic, lumbar and sacral areas  Patient tolerated the procedure well with improvement in symptoms  Patient given exercises, stretches and lifestyle modifications  See medications in patient instructions if given  Patient will follow up in 6-8 weeks

## 2015-11-22 NOTE — Progress Notes (Signed)
[name] [name], [place] [address] Phone: [phone] Subjective:     CC: Low back pain follow up Upper back pain Right knee pain  [id number]  [name] is a 40 y.o. female coming in with complaint of low back pain.  Patient was having worsening symptoms. Not responding to any other injections in the gluteal injection as well as a sacroiliac joint injection. Patient was sent for an epidural in the back after an MRI showed nerve root impingement consistent with her symptoms. Patient states that it is improved. He is responding overall. Seems to be making improvement.  In tenderness in the upper back and responded well to osteopathic manipulation. Also feels that patient's Effexor has been beneficial for many different things. Feels the doses doing well. Patient's blood pressure seems to have stabilized.  Patient is complaining of some mild right knee pain. States that overall his seeming to do better as well. Happy with results of far.  Past Medical History:  Diagnosis Date  . Allergy   . Hypertension    Past Surgical History:  Procedure Laterality Date  . CHOLECYSTECTOMY    . TUBAL LIGATION     Social History  Substance Use Topics  . Smoking status: Never Smoker  . Smokeless tobacco: Not on file  . Alcohol use Yes   Family History  Problem Relation Age of Onset  . Cancer Mother     Breast Cancer  . Hypertension Father   . Hyperlipidemia Father   . Other Brother     lyme  . Stroke Maternal Grandmother   . Alcohol abuse Paternal Grandmother   . Heart disease Paternal Grandfather    Allergies  Allergen Reactions  . Lamisil [Terbinafine Hcl] Hives and Itching  . Terbinafine Hcl Hives       Past medical history, social, surgical and family history all reviewed in electronic medical record.   Review of Systems: No headache, visual changes, nausea, vomiting, diarrhea, constipation, dizziness, abdominal pain,  skin rash, fevers, chills, night sweats, weight loss, swollen lymph nodes, body aches, joint swelling, muscle aches, chest pain, shortness of breath, mood changes.   Objective  Blood pressure 122/82, pulse 91, weight 139 lb (63 kg), SpO2 97 %.  General: No apparent distress alert and oriented x3 mood and affect normal, dressed appropriately.  HEENT: Pupils equal, extraocular movements intact  Respiratory: Patient's speak in full sentences and does not appear short of breath  Cardiovascular: No lower extremity edema, non tender, no erythema  Skin: Warm dry intact with no signs of infection or rash on extremities or on axial skeleton.  Abdomen: Soft nontender  Neuro: Cranial nerves II through XII are intact, neurovascularly intact in all extremities with 2+ DTRs and 2+ pulses.  Lymph: No lymphadenopathy of posterior or anterior cervical chain or axillae bilaterally.  Gait normal with good balance and coordination.  MSK:  Non tender with full range of motion and good stability and symmetric strength and tone of shoulders, elbows, wrist, hip, and ankles bilaterally.  Back Exam:  Inspection: Unremarkable  Motion: Flexion 35 deg  Extension 25 deg, Side Bending to 45 deg bilaterally, Rotation to 45 deg bilaterally mild improvement in range of motion SLR laying: Negative XSLR laying: Negative  Palpable tenderness:  Nontender on exam today. FABER:  negative Mild scapular dyskinesia noted on the right side Reflexes: 2+ at both patellar tendons, 2+ at achilles tendons, Babinski's downgoing.  Strength at foot  Plantar-flexion: 5/5  Dorsi-flexion: 5/5 Eversion: 5/5 Inversion: 5/5  Leg strength  Quad: 5/5 Hamstring: 5/5 Hip flexor: 5/5 Hip abductors: on right side only. Gait unremarkable.  Osteopathic findings T3 extended rotated and side bent right T7 extended rotated and side bent left L2 flexed rotated and side bent right Sacrum right on right    Impression and Recommendations:     This  case required medical decision making of moderate complexity.

## 2015-12-06 ENCOUNTER — Ambulatory Visit (INDEPENDENT_AMBULATORY_CARE_PROVIDER_SITE_OTHER): Payer: 59 | Admitting: Podiatry

## 2015-12-06 ENCOUNTER — Encounter: Payer: Self-pay | Admitting: Podiatry

## 2015-12-06 DIAGNOSIS — M779 Enthesopathy, unspecified: Secondary | ICD-10-CM | POA: Diagnosis not present

## 2015-12-06 DIAGNOSIS — M79672 Pain in left foot: Secondary | ICD-10-CM | POA: Diagnosis not present

## 2015-12-06 DIAGNOSIS — M79671 Pain in right foot: Secondary | ICD-10-CM | POA: Diagnosis not present

## 2015-12-06 NOTE — Progress Notes (Signed)
Subjective:     Patient ID: [name], female   DOB: 1975/07/07, 40 y.o.   MRN: [id number]  HPI patient states that overall orthotics feel good but there is some rubbing on the outside of the right foot   Review of Systems     Objective:   Physical Exam Neurovascular status intact with mild discomfort lateral side right foot    Assessment:     Tendinitis-like symptoms    Plan:     Advised on physical therapy anti-inflammatory ice therapy and may need to modify orthotics if symptoms persist

## 2015-12-06 NOTE — Patient Instructions (Signed)

## 2015-12-18 ENCOUNTER — Other Ambulatory Visit: Payer: Self-pay | Admitting: *Deleted

## 2015-12-18 MED ORDER — VENLAFAXINE HCL ER 150 MG PO CP24: 150.0000 mg | ORAL_CAPSULE | Freq: Every day | ORAL | 1 refills | Status: DC

## 2015-12-18 NOTE — Telephone Encounter (Signed)
Refill done.  

## 2015-12-19 ENCOUNTER — Encounter: Payer: Self-pay | Admitting: Family Medicine

## 2015-12-20 MED ORDER — VENLAFAXINE HCL ER 150 MG PO CP24: 150.0000 mg | ORAL_CAPSULE | Freq: Every day | ORAL | 1 refills | Status: DC

## 2015-12-20 MED FILL — VENLAFAXINE HCL ER 150 MG C: 150 | 90 days supply | Qty: 90 | Fill #0

## 2015-12-29 DIAGNOSIS — Z20818 Contact with and (suspected) exposure to other bacterial communicable diseases: Secondary | ICD-10-CM | POA: Diagnosis not present

## 2015-12-29 DIAGNOSIS — J029 Acute pharyngitis, unspecified: Secondary | ICD-10-CM | POA: Diagnosis not present

## 2016-01-10 ENCOUNTER — Encounter: Payer: Self-pay | Admitting: Family Medicine

## 2016-01-10 ENCOUNTER — Ambulatory Visit (INDEPENDENT_AMBULATORY_CARE_PROVIDER_SITE_OTHER): Payer: 59 | Admitting: Family Medicine

## 2016-01-10 ENCOUNTER — Ambulatory Visit: Payer: 59 | Admitting: Family Medicine

## 2016-01-10 VITALS — BP 112/80 | HR 94 | Ht 63.0 in | Wt 140.0 lb

## 2016-01-10 DIAGNOSIS — M5416 Radiculopathy, lumbar region: Secondary | ICD-10-CM

## 2016-01-10 DIAGNOSIS — M999 Biomechanical lesion, unspecified: Secondary | ICD-10-CM

## 2016-01-10 NOTE — Progress Notes (Signed)
[name] [address], [place] [address] Phone: [phone] Subjective:    CC: Low back pain follow up Upper back pain Right knee pain  IRW:[id number]  [name] is a 40 y.o. female coming in with complaint of low back pain.  Patient was having worsening symptoms. Not responding to any other injections in the gluteal injection as well as a sacroiliac joint injection. Patient was sent for an epidural in the back after an MRI showed nerve root impingement consistent with her symptoms. Patient states that it is improved. He is responding overall.   In tenderness in the upper back and responded well to osteopathic manipulation. Good days and bad days, no numbness, no radiation    Past Medical History:  Diagnosis Date  . Allergy   . Hypertension    Past Surgical History:  Procedure Laterality Date  . CHOLECYSTECTOMY    . TUBAL LIGATION     Social History  Substance Use Topics  . Smoking status: Never Smoker  . Smokeless tobacco: Never Used  . Alcohol use Yes   Family History  Problem Relation Age of Onset  . Cancer Mother     Breast Cancer  . Hypertension Father   . Hyperlipidemia Father   . Other Brother     lyme  . Stroke Maternal Grandmother   . Alcohol abuse Paternal Grandmother   . Heart disease Paternal Grandfather    Allergies  Allergen Reactions  . Lamisil [Terbinafine Hcl] Hives and Itching  . Terbinafine Hcl Hives       Past medical history, social, surgical and family history all reviewed in electronic medical record.   Review of Systems: No headache, visual changes, nausea, vomiting, diarrhea, constipation, dizziness, abdominal pain, skin rash, fevers, chills, night sweats, weight loss, swollen lymph nodes, body aches, joint swelling, muscle aches, chest pain, shortness of breath, mood changes.   Objective  Blood pressure 112/80, pulse 94, height 5' 3"  (1.6 m), weight 140 lb (63.5 kg), SpO2 99 %.  Systems  examined below as of 01/10/16 General: NAD A&O x3 mood, affect normal  HEENT: Pupils equal, extraocular movements intact no nystagmus Respiratory: not short of breath at rest or with speaking Cardiovascular: No lower extremity edema, non tender Skin: Warm dry intact with no signs of infection or rash on extremities or on axial skeleton. Abdomen: Soft nontender, no masses Neuro: Cranial nerves  intact, neurovascularly intact in all extremities with 2+ DTRs and 2+ pulses. Lymph: No lymphadenopathy appreciated today  Gait normal with good balance and coordination.  MSK: Non tender with full range of motion and good stability and symmetric strength and tone of shoulders, elbows, wrist,  knee hips and ankles bilaterally.   Back Exam:  Inspection: Unremarkable  Motion: Flexion 35 deg  Extension 20 deg, Side Bending to 35 deg bilaterally, Rotation to 45 deg bilaterally  SLR laying: Negative XSLR laying: Negative  Palpable tenderness: mild increase in tenderness from the other  FABER:  negative Mild scapular dyskinesia noted on the right side Reflexes: 2+ at both patellar tendons, 2+ at achilles tendons, Babinski's downgoing.  Strength at foot  Plantar-flexion: 5/5 Dorsi-flexion: 5/5 Eversion: 5/5 Inversion: 5/5  Leg strength  Quad: 5/5 Hamstring: 5/5 Hip flexor: 5/5 Hip abductors: on right side only. Gait unremarkable.  Osteopathic findings T3 extended rotated and side bent right T9 extended rotated and side bent left L2 flexed rotated and side bent right Sacrum right on right  Impression and Recommendations:  This case required medical decision making of moderate complexity.

## 2016-01-10 NOTE — Assessment & Plan Note (Signed)
Decision today to treat with OMT was based on Physical Exam  After verbal consent patient was treated with HVLA techniques in thoracic, lumbar and sacral areas  Patient tolerated the procedure well with improvement in symptoms  Patient given exercises, stretches and lifestyle modifications  See medications in patient instructions if given  Patient will follow up in 6-8 weeks

## 2016-01-10 NOTE — Assessment & Plan Note (Signed)
Stable at moment, no need for further intervention Continue current therapy. Patient will continue to work on posture and ergonomics. Follow-up with me again in 6-8 weeks.

## 2016-01-10 NOTE — Patient Instructions (Addendum)
Good to see you  [name] is your friend.  Keep trucking along Try to find time for yourself.  On wall with heels, butt shoulder and head touching for a goal of 5 minutes daily  See me again in 6 weeks.  See [place].

## 2016-01-21 ENCOUNTER — Ambulatory Visit (INDEPENDENT_AMBULATORY_CARE_PROVIDER_SITE_OTHER): Payer: 59 | Admitting: Internal Medicine

## 2016-01-21 ENCOUNTER — Other Ambulatory Visit (INDEPENDENT_AMBULATORY_CARE_PROVIDER_SITE_OTHER): Payer: 59

## 2016-01-21 ENCOUNTER — Encounter: Payer: Self-pay | Admitting: Internal Medicine

## 2016-01-21 VITALS — BP 136/94 | HR 94 | Temp 97.9°F | Resp 16 | Wt 139.0 lb

## 2016-01-21 DIAGNOSIS — R05 Cough: Secondary | ICD-10-CM

## 2016-01-21 DIAGNOSIS — R590 Localized enlarged lymph nodes: Secondary | ICD-10-CM

## 2016-01-21 DIAGNOSIS — R0982 Postnasal drip: Secondary | ICD-10-CM | POA: Insufficient documentation

## 2016-01-21 DIAGNOSIS — R61 Generalized hyperhidrosis: Secondary | ICD-10-CM | POA: Diagnosis not present

## 2016-01-21 DIAGNOSIS — R059 Cough, unspecified: Secondary | ICD-10-CM

## 2016-01-21 LAB — COMPREHENSIVE METABOLIC PANEL
ALBUMIN: 4.5 g/dL (ref 3.5–5.2)
ALK PHOS: 85 U/L (ref 39–117)
ALT: 17 U/L (ref 0–35)
AST: 15 U/L (ref 0–37)
BUN: 17 mg/dL (ref 6–23)
CALCIUM: 9.4 mg/dL (ref 8.4–10.5)
CHLORIDE: 102 meq/L (ref 96–112)
CO2: 31 mEq/L (ref 19–32)
CREATININE: 0.78 mg/dL (ref 0.40–1.20)
GFR: 85.82 mL/min (ref >60.00)
Glucose, Bld: 103 mg/dL — ABNORMAL HIGH (ref 70–99)
Potassium: 4 mEq/L (ref 3.5–5.1)
Sodium: 139 mEq/L (ref 135–145)
TOTAL PROTEIN: 7.1 g/dL (ref 6.0–8.3)
Total Bilirubin: 0.4 mg/dL (ref 0.2–1.2)

## 2016-01-21 LAB — CBC WITH DIFFERENTIAL/PLATELET
BASOS ABS: 0.1 10*3/uL (ref 0.0–0.1)
Basophils Relative: 0.5 % (ref 0.0–3.0)
Eosinophils Absolute: 0.6 10*3/uL (ref 0.0–0.7)
Eosinophils Relative: 4.7 % (ref 0.0–5.0)
HCT: 41.9 % (ref 36.0–46.0)
Hemoglobin: 14.1 g/dL (ref 12.0–15.0)
LYMPHS ABS: 3.6 10*3/uL (ref 0.7–4.0)
Lymphocytes Relative: 30.9 % (ref 12.0–46.0)
MCHC: 33.8 g/dL (ref 30.0–36.0)
MCV: 90.1 fl (ref 78.0–100.0)
MONO ABS: 0.5 10*3/uL (ref 0.1–1.0)
Monocytes Relative: 4.3 % (ref 3.0–12.0)
NEUTROS PCT: 59.6 % (ref 43.0–77.0)
Neutro Abs: 7 10*3/uL (ref 1.4–7.7)
Platelets: 329 10*3/uL (ref 150.0–400.0)
RBC: 4.65 Mil/uL (ref 3.87–5.11)
RDW: 12.3 % (ref 11.5–15.5)
WBC: 11.8 10*3/uL — ABNORMAL HIGH (ref 4.0–10.5)

## 2016-01-21 LAB — TSH: TSH: 1.52 u[IU]/mL (ref 0.35–4.50)

## 2016-01-21 MED FILL — GABAPENTIN 100 MG CAPSULE: 100 | 30 days supply | Qty: 60 | Fill #3

## 2016-01-21 NOTE — Assessment & Plan Note (Signed)
Related to allergies Start flonase

## 2016-01-21 NOTE — Assessment & Plan Note (Signed)
Likely from PND or GERD Start flonase If no improvement she will try zantac at night

## 2016-01-21 NOTE — Patient Instructions (Addendum)
Start flonase daily   Test(s) ordered today. Your results will be released to [place] (or called to you) after review, usually within 72hours after test completion. If any changes need to be made, you will be notified at that same time.   Medications reviewed and updated.  No changes recommended at this time, except starting the flonase.

## 2016-01-21 NOTE — Assessment & Plan Note (Signed)
Only at night - no fever or sweats during day Will f/u with gyn - unlikely premenopausal Check labs Return if no improvement

## 2016-01-21 NOTE — Assessment & Plan Note (Signed)
No signs of infection - likely related to allergies Check basic labs Start flonase Continue allegra

## 2016-01-21 NOTE — Progress Notes (Signed)
Subjective:    Patient ID: [name], female    DOB: April 26, 1975, 40 y.o.   MRN: [id number]  HPI She is here for an acute visit.   She feels run down and does not feel well.  Her symptoms started 3-4 weeks ago.  Her lymph nodes have been swollen.  She had a little sore throat, PND, difficult swallowing at times.   Her daughter had strep throat a couple of weeks ago and she had a sore throat at that time, but she was tested and was negative.  Her symptoms improved the next day, but have been mild since then.   She has tried taking allegra and it did not seem to help. She has been taking it for a couple of weeks.  She typically gets seasonal allergies, but they usually feel different.  In the past she took allegra D but has not taken that this year due to it elevating her BP.   She sleeps about 6-7 hours a night.  She is not currently exercising.  She has had some night sweats recently, but none during the day.    Medications and allergies reviewed with patient and updated if appropriate.  Patient Active Problem List   Diagnosis Date Noted  . Quadriceps muscle strain 10/14/2015  . Cough 09/14/2015  . Fullness of neck 09/14/2015  . Nonallopathic lesion of thoracic region 08/23/2015  . Lumbar radiculopathy 08/02/2015  . 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 Scapular dysfunction 03/20/2015  . 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 Nonallopathic lesion of lumbosacral region 06/29/2014  . Nonallopathic lesion of sacral region 06/29/2014  . Nonallopathic lesion of pelvic region 06/29/2014  . Recurrent UTI 05/18/2013  . Back pain, acute 10/07/2010  . Allergic rhinitis 08/14/2010  . Essential hypertension, benign 01/22/2010    Current Outpatient Prescriptions on File Prior to Visit  Medication Sig Dispense Refill  . acetaminophen (TYLENOL) 650 MG CR tablet Take 650 mg by mouth every 8 (eight) hours as needed for pain.      Marland Kitchen amLODipine (NORVASC) 5 MG tablet Take 1 tablet (5 mg total) by mouth daily. 90 tablet 1  . gabapentin (NEURONTIN) 100 MG capsule Take 2 capsules (200 mg total) by mouth at bedtime. 60 capsule 3  . ibuprofen (ADVIL,MOTRIN) 200 MG tablet Take 200 mg by mouth. Reported on 04/05/2015    . meloxicam (MOBIC) 15 MG tablet Take 1 tablet (15 mg total) by mouth daily. 90 tablet 1  . nitrofurantoin (MACRODANTIN) 50 MG capsule Take after intercourse 45 capsule 2  . traMADol (ULTRAM) 50 MG tablet Take 1 tablet (50 mg total) by mouth every 8 (eight) hours as needed. 30 tablet 0  . venlafaxine XR (EFFEXOR-XR) 150 MG 24 hr capsule Take 1 capsule (150 mg total) by mouth daily with breakfast. 90 capsule 1   No current facility-administered medications on file prior to visit.     Past Medical History:  Diagnosis Date  . Allergy   . Hypertension     Past Surgical History:  Procedure Laterality Date  . CHOLECYSTECTOMY    . TUBAL LIGATION      Social History   Social History  . Marital status: Married    Spouse name: N/A  . Number of children: N/A  . Years of education: N/A   Social History Main Topics  . Smoking status: Never Smoker  . Smokeless tobacco: Never Used  . Alcohol use Yes  .  Drug use: No  . Sexual activity: Yes   Other Topics Concern  . 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 Cancer Mother     Breast Cancer  . Hypertension Father   . Hyperlipidemia Father   . Other Brother     lyme  . Stroke Maternal Grandmother   . Alcohol abuse Paternal Grandmother   . Heart disease Paternal Grandfather     Review of Systems  Constitutional: Positive for diaphoresis (night only) and fatigue. Negative for appetite change and fever.  HENT: Positive for postnasal drip and trouble swallowing (lump or food particles get stuck). Negative for congestion, ear pain (pressure in ears),  sinus pain and sinus pressure.   Eyes: Negative for visual disturbance.  Respiratory: Positive for cough (dry, intermittent since July). Negative for shortness of breath and wheezing.   Cardiovascular: Negative for chest pain, palpitations and leg swelling.  Gastrointestinal: Positive for constipation (can last a few days). Negative for abdominal pain.       GERD once a month  Genitourinary: Negative for dysuria and hematuria.  Musculoskeletal: Positive for arthralgias (chronic, no change) and myalgias (no change from chroni).  Skin: Negative for rash.  Neurological: Negative for light-headedness and headaches.  Hematological: Positive for adenopathy (sore spots and swollen feeling in neck).       Objective:   Vitals:   01/21/16 1131  BP: (!) 136/94  Pulse: 94  Resp: 16  Temp: 97.9 F (36.6 C)   Filed Weights   01/21/16 1131  Weight: 139 lb (63 kg)   Body mass index is 24.62 kg/m.   Physical Exam GENERAL APPEARANCE: Appears stated age, well appearing, NAD EYES: conjunctiva clear, no icterus HEENT: bilateral tympanic membranes and ear canals normal, oropharynx with no erythema, no thyromegaly, trachea midline, no cervical or supraclavicular lymphadenopathy LUNGS: Clear to auscultation without wheeze or crackles, unlabored breathing, good air entry bilaterally HEART: Normal S1,S2 without murmurs ABD: soft, non tender, non distended EXTREMITIES: Without clubbing, cyanosis, or edema      Assessment & Plan:   See Problem List for Assessment and Plan of chronic medical problems.

## 2016-01-21 NOTE — Progress Notes (Signed)
Pre visit review using our clinic review tool, if applicable. No additional management support is needed unless otherwise documented below in the visit note. 

## 2016-01-27 DIAGNOSIS — H5203 Hypermetropia, bilateral: Secondary | ICD-10-CM | POA: Diagnosis not present

## 2016-02-16 NOTE — Patient Instructions (Addendum)
All other Health Maintenance issues reviewed.   All recommended immunizations and age-appropriate screenings are up-to-date or discussed.  No immunizations administered today.   Medications reviewed and updated.  Changes include increasing zantac to twice daily.  Your prescription(s) have been submitted to your pharmacy. Please take as directed and contact our office if you believe you are having problem(s) with the medication(s).    Health Maintenance, Female Introduction Adopting a healthy lifestyle and getting preventive care can go a long way to promote health and wellness. Talk with your health care provider about what schedule of regular examinations is right for you. This is a good chance for you to check in with your provider about disease prevention and staying healthy. In between checkups, there are plenty of things you can do on your own. Experts have done a lot of research about which lifestyle changes and preventive measures are most likely to keep you healthy. Ask your health care provider for more information. Weight and diet Eat a healthy diet  Be sure to include plenty of vegetables, fruits, low-fat dairy products, and lean protein.  Do not eat a lot of foods high in solid fats, added sugars, or salt.  Get regular exercise. This is one of the most important things you can do for your health.  Most adults should exercise for at least 150 minutes each week. The exercise should increase your heart rate and make you sweat (moderate-intensity exercise).  Most adults should also do strengthening exercises at least twice a week. This is in addition to the moderate-intensity exercise. Maintain a healthy weight  Body mass index (BMI) is a measurement that can be used to identify possible weight problems. It estimates body fat based on height and weight. Your health care provider can help determine your BMI and help you achieve or maintain a healthy weight.  For females 24  years of age and older:  A BMI below 18.5 is considered underweight.  A BMI of 18.5 to 24.9 is normal.  A BMI of 25 to 29.9 is considered overweight.  A BMI of 30 and above is considered obese. Watch levels of cholesterol and blood lipids  You should start having your blood tested for lipids and cholesterol at 42 years of age, then have this test every 5 years.  You may need to have your cholesterol levels checked more often if:  Your lipid or cholesterol levels are high.  You are older than 42 years of age.  You are at high risk for heart disease. Cancer screening Lung Cancer  Lung cancer screening is recommended for adults 88-41 years old who are at high risk for lung cancer because of a history of smoking.  A yearly low-dose CT scan of the lungs is recommended for people who:  Currently smoke.  Have quit within the past 15 years.  Have at least a 30-pack-year history of smoking. A pack year is smoking an average of one pack of cigarettes a day for 1 year.  Yearly screening should continue until it has been 15 years since you quit.  Yearly screening should stop if you develop a health problem that would prevent you from having lung cancer treatment. Breast Cancer  Practice breast self-awareness. This means understanding how your breasts normally appear and feel.  It also means doing regular breast self-exams. Let your health care provider know about any changes, no matter how small.  If you are in your 20s or 30s, you should have a clinical  breast exam (CBE) by a health care provider every 1-3 years as part of a regular health exam.  If you are 34 or older, have a CBE every year. Also consider having a breast X-ray (mammogram) every year.  If you have a family history of breast cancer, talk to your health care provider about genetic screening.  If you are at high risk for breast cancer, talk to your health care provider about having an MRI and a mammogram every  year.  Breast cancer gene (BRCA) assessment is recommended for women who have family members with BRCA-related cancers. BRCA-related cancers include:  Breast.  Ovarian.  Tubal.  Peritoneal cancers.  Results of the assessment will determine the need for genetic counseling and BRCA1 and BRCA2 testing. Cervical Cancer  Your health care provider may recommend that you be screened regularly for cancer of the pelvic organs (ovaries, uterus, and vagina). This screening involves a pelvic examination, including checking for microscopic changes to the surface of your cervix (Pap test). You may be encouraged to have this screening done every 3 years, beginning at age 59.  For women ages 52-65, health care providers may recommend pelvic exams and Pap testing every 3 years, or they may recommend the Pap and pelvic exam, combined with testing for human papilloma virus (HPV), every 5 years. Some types of HPV increase your risk of cervical cancer. Testing for HPV may also be done on women of any age with unclear Pap test results.  Other health care providers may not recommend any screening for nonpregnant women who are considered low risk for pelvic cancer and who do not have symptoms. Ask your health care provider if a screening pelvic exam is right for you.  If you have had past treatment for cervical cancer or a condition that could lead to cancer, you need Pap tests and screening for cancer for at least 20 years after your treatment. If Pap tests have been discontinued, your risk factors (such as having a new sexual partner) need to be reassessed to determine if screening should resume. Some women have medical problems that increase the chance of getting cervical cancer. In these cases, your health care provider may recommend more frequent screening and Pap tests. Colorectal Cancer  This type of cancer can be detected and often prevented.  Routine colorectal cancer screening usually begins at 42 years  of age and continues through 42 years of age.  Your health care provider may recommend screening at an earlier age if you have risk factors for colon cancer.  Your health care provider may also recommend using home test kits to check for hidden blood in the stool.  A small camera at the end of a tube can be used to examine your colon directly (sigmoidoscopy or colonoscopy). This is done to check for the earliest forms of colorectal cancer.  Routine screening usually begins at age 14.  Direct examination of the colon should be repeated every 5-10 years through 42 years of age. However, you may need to be screened more often if early forms of precancerous polyps or small growths are found. Skin Cancer  Check your skin from head to toe regularly.  Tell your health care provider about any new moles or changes in moles, especially if there is a change in a mole's shape or color.  Also tell your health care provider if you have a mole that is larger than the size of a pencil eraser.  Always use sunscreen. Apply sunscreen liberally  and repeatedly throughout the day.  Protect yourself by wearing long sleeves, pants, a wide-brimmed hat, and sunglasses whenever you are outside. Heart disease, diabetes, and high blood pressure  High blood pressure causes heart disease and increases the risk of stroke. High blood pressure is more likely to develop in:  People who have blood pressure in the high end of the normal range (130-139/85-89 mm Hg).  People who are overweight or obese.  People who are African American.  If you are 62-40 years of age, have your blood pressure checked every 3-5 years. If you are 39 years of age or older, have your blood pressure checked every year. You should have your blood pressure measured twice-once when you are at a hospital or clinic, and once when you are not at a hospital or clinic. Record the average of the two measurements. To check your blood pressure when you  are not at a hospital or clinic, you can use:  An automated blood pressure machine at a pharmacy.  A home blood pressure monitor.  If you are between 26 years and 37 years old, ask your health care provider if you should take aspirin to prevent strokes.  Have regular diabetes screenings. This involves taking a blood sample to check your fasting blood sugar level.  If you are at a normal weight and have a low risk for diabetes, have this test once every three years after 42 years of age.  If you are overweight and have a high risk for diabetes, consider being tested at a younger age or more often. Preventing infection Hepatitis B  If you have a higher risk for hepatitis B, you should be screened for this virus. You are considered at high risk for hepatitis B if:  You were born in a country where hepatitis B is common. Ask your health care provider which countries are considered high risk.  Your parents were born in a high-risk country, and you have not been immunized against hepatitis B (hepatitis B vaccine).  You have HIV or AIDS.  You use needles to inject street drugs.  You live with someone who has hepatitis B.  You have had sex with someone who has hepatitis B.  You get hemodialysis treatment.  You take certain medicines for conditions, including cancer, organ transplantation, and autoimmune conditions. Hepatitis C  Blood testing is recommended for:  Everyone born from [birth year] through 1967.  Anyone with known risk factors for hepatitis C. Sexually transmitted infections (STIs)  You should be screened for sexually transmitted infections (STIs) including gonorrhea and chlamydia if:  You are sexually active and are younger than 42 years of age.  You are older than 42 years of age and your health care provider tells you that you are at risk for this type of infection.  Your sexual activity has changed since you were last screened and you are at an increased risk for  chlamydia or gonorrhea. Ask your health care provider if you are at risk.  If you do not have HIV, but are at risk, it may be recommended that you take a prescription medicine daily to prevent HIV infection. This is called pre-exposure prophylaxis (PrEP). You are considered at risk if:  You are sexually active and do not regularly use condoms or know the HIV status of your partner(s).  You take drugs by injection.  You are sexually active with a partner who has HIV. Talk with your health care provider about whether you are at high  risk of being infected with HIV. If you choose to begin PrEP, you should first be tested for HIV. You should then be tested every 3 months for as long as you are taking PrEP. Pregnancy  If you are premenopausal and you may become pregnant, ask your health care provider about preconception counseling.  If you may become pregnant, take 400 to 800 micrograms (mcg) of folic acid every day.  If you want to prevent pregnancy, talk to your health care provider about birth control (contraception). Osteoporosis and menopause  Osteoporosis is a disease in which the bones lose minerals and strength with aging. This can result in serious bone fractures. Your risk for osteoporosis can be identified using a bone density scan.  If you are 56 years of age or older, or if you are at risk for osteoporosis and fractures, ask your health care provider if you should be screened.  Ask your health care provider whether you should take a calcium or vitamin D supplement to lower your risk for osteoporosis.  Menopause may have certain physical symptoms and risks.  Hormone replacement therapy may reduce some of these symptoms and risks. Talk to your health care provider about whether hormone replacement therapy is right for you. Follow these instructions at home:  Schedule regular health, dental, and eye exams.  Stay current with your immunizations.  Do not use any tobacco products  including cigarettes, chewing tobacco, or electronic cigarettes.  If you are pregnant, do not drink alcohol.  If you are breastfeeding, limit how much and how often you drink alcohol.  Limit alcohol intake to no more than 1 drink per day for nonpregnant women. One drink equals 12 ounces of beer, 5 ounces of wine, or 1 ounces of hard liquor.  Do not use street drugs.  Do not share needles.  Ask your health care provider for help if you need support or information about quitting drugs.  Tell your health care provider if you often feel depressed.  Tell your health care provider if you have ever been abused or do not feel safe at home. This information is not intended to replace advice given to you by your health care provider. Make sure you discuss any questions you have with your health care provider. Document Released: 09/08/2010 Document Revised: 08/01/2015 Document Reviewed: 11/27/2014  2017 [employer]

## 2016-02-16 NOTE — Progress Notes (Signed)
Subjective:    Patient ID: [name], female    DOB: 03/04/1976, 40 y.o.   MRN: [id number]  HPI She is here for a physical exam.   Hypertension: She is taking her medication daily. She is usually compliant with a low sodium diet, but not always.  She denies chest pain, palpitations, edema, shortness of breath and regular headaches. She is not exercising regularly.  She does not monitor her blood pressure at home.    Her lymph nodes have gone done.  She is still have PND and nasal congestion. She is taking he allegra and flonase.  She is taking the zantac at night. She thinks there was some GERD that was causing the cough.  She has waken up several times with sweating at night.  She denies sweating during the day.    Medications and allergies reviewed with patient and updated if appropriate.  Patient Active Problem List   Diagnosis Date Noted  . Night sweats 01/21/2016  . Cervical lymphadenopathy 01/21/2016  . Post-nasal drip 01/21/2016  . Quadriceps muscle strain 10/14/2015  . Cough 09/14/2015  . Fullness of neck 09/14/2015  . Nonallopathic lesion of thoracic region 08/23/2015  . Lumbar radiculopathy 08/02/2015  . 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 Scapular dysfunction 03/20/2015  . 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 Nonallopathic lesion of lumbosacral region 06/29/2014  . Nonallopathic lesion of sacral region 06/29/2014  . Nonallopathic lesion of pelvic region 06/29/2014  . Recurrent UTI 05/18/2013  . Back pain, acute 10/07/2010  . Allergic rhinitis 08/14/2010  . Essential hypertension, benign 01/22/2010    Current Outpatient Prescriptions on File Prior to Visit  Medication Sig Dispense Refill  . acetaminophen (TYLENOL) 650 MG CR tablet Take 650 mg by mouth every 8 (eight) hours as needed for pain.    Marland Kitchen amLODipine (NORVASC) 5 MG tablet Take 1 tablet (5 mg total) by mouth daily. 90 tablet 1  .  Fexofenadine HCl (ALLEGRA PO) Take by mouth.    . gabapentin (NEURONTIN) 100 MG capsule Take 2 capsules (200 mg total) by mouth at bedtime. 60 capsule 3  . ibuprofen (ADVIL,MOTRIN) 200 MG tablet Take 200 mg by mouth. Reported on 04/05/2015    . nitrofurantoin (MACRODANTIN) 50 MG capsule Take after intercourse 45 capsule 2  . traMADol (ULTRAM) 50 MG tablet Take 1 tablet (50 mg total) by mouth every 8 (eight) hours as needed. 30 tablet 0  . venlafaxine XR (EFFEXOR-XR) 150 MG 24 hr capsule Take 1 capsule (150 mg total) by mouth daily with breakfast. 90 capsule 1   No current facility-administered medications on file prior to visit.     Past Medical History:  Diagnosis Date  . Allergy   . Hypertension     Past Surgical History:  Procedure Laterality Date  . CHOLECYSTECTOMY    . TUBAL LIGATION      Social History   Social History  . Marital status: Married    Spouse name: N/A  . Number of children: N/A  . Years of education: N/A   Social History Main Topics  . Smoking status: Never Smoker  . Smokeless tobacco: Never Used  . Alcohol use Yes  . Drug use: No  . Sexual activity: Yes   Other Topics Concern  . 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 Cancer Mother     Breast Cancer  . Hypertension Father   . Hyperlipidemia Father   . Other Brother     lyme  . Stroke Maternal Grandmother   . Alcohol abuse Paternal Grandmother   . Heart disease Paternal Grandfather     Review of Systems  Constitutional: Negative for chills and fever.  HENT: Negative for hearing loss.   Eyes: Negative for visual disturbance.  Respiratory: Negative for cough, shortness of breath and wheezing.   Cardiovascular: Negative for chest pain, palpitations and leg swelling.  Gastrointestinal: Positive for constipation (freq bouts). Negative for abdominal pain, blood in stool, diarrhea and nausea.         Some gerd - taking zantac  Genitourinary: Negative for dysuria and hematuria.  Musculoskeletal: Positive for arthralgias (knees and hands in winter or after working long hours) and back pain.  Neurological: Negative for dizziness, light-headedness and headaches.  Psychiatric/Behavioral: Negative for dysphoric mood. The patient is not nervous/anxious.        Objective:   Vitals:   02/17/16 1041  BP: (!) 154/108  Pulse: 79  Resp: 16  Temp: 98 F (36.7 C)   Filed Weights   02/17/16 1041  Weight: 142 lb (64.4 kg)   Body mass index is 25.15 kg/m.   Physical Exam Constitutional: She appears well-developed and well-nourished. No distress.  HENT:  Head: Normocephalic and atraumatic.  Right Ear: External ear normal. Normal ear canal and TM Left Ear: External ear normal.  Normal ear canal and TM Mouth/Throat: Oropharynx is clear and moist.  Eyes: Conjunctivae and EOM are normal.  Neck: Neck supple. No tracheal deviation present. No thyromegaly present.  No carotid bruit  Cardiovascular: Normal rate, regular rhythm and normal heart sounds.   No murmur heard.  No edema. Pulmonary/Chest: Effort normal and breath sounds normal. No respiratory distress. She has no wheezes. She has no rales.  Breast: deferred to Gyn Abdominal: Soft. She exhibits no distension. There is no tenderness.  Lymphadenopathy: She has no cervical adenopathy.  Skin: Skin is warm and dry. She is not diaphoretic.  Psychiatric: She has a normal mood and affect. Her behavior is normal.         Assessment & Plan:   Physical exam: Screening blood work  Deferred - had recent blood work - no additional tests needed Immunizations   Up to date Mammogram  Up to date  Gyn due - will schedule Eye exams  Up to date  Exercise - none at this time, will try to start back - will have another spinal injection to see if back pain improves - then can exercise Weight - working on weight loss Skin       no  concerns Substance abuse - none  See Problem List for Assessment and Plan of chronic medical problems.

## 2016-02-17 ENCOUNTER — Encounter: Payer: Self-pay | Admitting: Internal Medicine

## 2016-02-17 ENCOUNTER — Ambulatory Visit (INDEPENDENT_AMBULATORY_CARE_PROVIDER_SITE_OTHER): Payer: 59 | Admitting: Internal Medicine

## 2016-02-17 VITALS — BP 154/108 | HR 79 | Temp 98.0°F | Resp 16 | Wt 142.0 lb

## 2016-02-17 DIAGNOSIS — I10 Essential (primary) hypertension: Secondary | ICD-10-CM | POA: Diagnosis not present

## 2016-02-17 DIAGNOSIS — J309 Allergic rhinitis, unspecified: Secondary | ICD-10-CM

## 2016-02-17 DIAGNOSIS — R05 Cough: Secondary | ICD-10-CM

## 2016-02-17 DIAGNOSIS — R059 Cough, unspecified: Secondary | ICD-10-CM

## 2016-02-17 DIAGNOSIS — K219 Gastro-esophageal reflux disease without esophagitis: Secondary | ICD-10-CM | POA: Insufficient documentation

## 2016-02-17 DIAGNOSIS — Z0000 Encounter for general adult medical examination without abnormal findings: Secondary | ICD-10-CM

## 2016-02-17 MED ORDER — RANITIDINE HCL 150 MG PO TABS
150.0000 mg | ORAL_TABLET | Freq: Two times a day (BID) | ORAL | 3 refills | Status: DC
Start: 2016-02-17 — End: 2018-02-07

## 2016-02-17 MED ORDER — NAPROXEN SODIUM 220 MG PO TABS: 220.0000 mg | ORAL_TABLET | Freq: Two times a day (BID) | ORAL | Status: DC

## 2016-02-17 MED FILL — raNITIdine HCL 150 MG TABS: 150 | 90 days supply | Qty: 180 | Fill #0

## 2016-02-17 NOTE — Assessment & Plan Note (Addendum)
Was coughing cough - improved with zantac nightly Will increased zantac to twice daily Avoid PPI given medullary kidney dis GERD diet

## 2016-02-17 NOTE — Assessment & Plan Note (Signed)
Continue allegra and flonase

## 2016-02-17 NOTE — Progress Notes (Signed)
Pre visit review using our clinic review tool, if applicable. No additional management support is needed unless otherwise documented below in the visit note. 

## 2016-02-17 NOTE — Assessment & Plan Note (Signed)
Improved with allergy medication and zantac Continue both  Discussed lifestyle changes to help with Jerrye Bushy prevention

## 2016-02-17 NOTE — Assessment & Plan Note (Signed)
BP elevated here today - ? 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

## 2016-02-21 ENCOUNTER — Other Ambulatory Visit: Payer: Self-pay | Admitting: Family Medicine

## 2016-02-24 ENCOUNTER — Encounter: Payer: Self-pay | Admitting: Internal Medicine

## 2016-02-24 MED FILL — AMLODIPINE BESYLATE 5 MG TA: 5 | 90 days supply | Qty: 90 | Fill #0

## 2016-02-24 NOTE — Telephone Encounter (Signed)
Refill done.  

## 2016-02-25 NOTE — Progress Notes (Signed)
[name] [address] [place], [place] [address] Phone: [phone] Subjective:    CC: Low back pain follow up Upper back pain   [id number]  [name] is a 40 y.o. female coming in with complaint of low back pain.  Patient was having worsening symptoms. Not responding to any other injections in the gluteal injection as well as a sacroiliac joint injection. Patient was sent for an epidural in the back after an MRI showed nerve root impingement consistent with her symptoms. Doing well.  tenderness in the upper back and responded well to osteopathic manipulation. Good days and bad days, no numbness, no radiation  Patient states it still seems to be not the same. Has aching that is intermittent. Patient states seems be worse with activities. Has responded well to osteopathic manipulation. Not doing the exercises very regularly. Feels that the Effexor has been beneficial.  Patient though is also unfortunate having times where her knees or her wrist seem to swell. Seems to be intermittent. Seems that one does it from time to time in the other. No rhyme or reason. Seems to be with activity and repetitive movement. No numbness associated with it or any radiation down the leg.     Past Medical History:  Diagnosis Date  . Allergy   . Hypertension    Past Surgical History:  Procedure Laterality Date  . CHOLECYSTECTOMY    . TUBAL LIGATION     Social History  Substance Use Topics  . Smoking status: Never Smoker  . Smokeless tobacco: Never Used  . Alcohol use Yes   Family History  Problem Relation Age of Onset  . Cancer Mother     Breast Cancer  . Hypertension Father   . Hyperlipidemia Father   . Other Brother     lyme  . Stroke Maternal Grandmother   . Alcohol abuse Paternal Grandmother   . Heart disease Paternal Grandfather    Allergies  Allergen Reactions  . Lamisil [Terbinafine Hcl] Hives and Itching  . Terbinafine Hcl Hives         Past medical history, social, surgical and family history all reviewed in electronic medical record.   Review of Systems: No headache, visual changes, nausea, vomiting, diarrhea, constipation, dizziness, abdominal pain, skin rash, fevers, chills, night sweats, weight loss, swollen lymph nodes, body aches, joint swelling, muscle aches, chest pain, shortness of breath, mood changes.  .      Objective  There were no vitals taken for this visit.  Systems examined below as of 02/26/16 General: NAD A&O x3 mood, affect normal  HEENT: Pupils equal, extraocular movements intact no nystagmus Respiratory: not short of breath at rest or with speaking Cardiovascular: No lower extremity edema, non tender Skin: Warm dry intact with no signs of infection or rash on extremities or on axial skeleton. Abdomen: Soft nontender, no masses Neuro: Cranial nerves  intact, neurovascularly intact in all extremities with 2+ DTRs and 2+ pulses. Lymph: No lymphadenopathy appreciated today  Gait normal with good balance and coordination.  MSK:  full range of motion and good stability and symmetric strength and tone of shoulders, elbows, wrist,  knee hips and ankles bilaterally.  Patient though does have some nonspecific tenderness to multiple joints  Back Exam:  Inspection: Unremarkable  Motion: Flexion 35 deg  Extension 20 deg, Side Bending to 35 deg bilaterally, Rotation to 30 deg bilaterally mild increasing tightness from previous exam SLR laying: Negative XSLR laying: Negative  Palpable  tenderness: Worsening stiffness of the paraspinal musculature mostly over L5-S1 bilaterally FABER:  negative Mild scapular dyskinesia noted on the right side Reflexes: 2+ at both patellar tendons, 2+ at achilles tendons, Babinski's downgoing.  Strength at foot  Plantar-flexion: 5/5 Dorsi-flexion: 5/5 Eversion: 5/5 Inversion: 5/5  Leg strength  Quad: 5/5 Hamstring: 5/5 Hip flexor: 5/5 Hip abductors: on right side  only. Gait unremarkable.  Osteopathic findings Cervical C2 flexed rotated and side bent right C4 flexed rotated and side bent left C6 flexed rotated and side bent left T3 extended rotated and side bent right inhaled third rib T9 extended rotated and side bent left L2 flexed rotated and side bent right Sacrum right on right   Impression and Recommendations:     This case required medical decision making of moderate complexity.

## 2016-02-26 ENCOUNTER — Other Ambulatory Visit (INDEPENDENT_AMBULATORY_CARE_PROVIDER_SITE_OTHER): Payer: 59

## 2016-02-26 ENCOUNTER — Ambulatory Visit (INDEPENDENT_AMBULATORY_CARE_PROVIDER_SITE_OTHER): Payer: 59 | Admitting: Family Medicine

## 2016-02-26 VITALS — BP 120/90 | HR 82 | Ht 63.0 in | Wt 140.8 lb

## 2016-02-26 DIAGNOSIS — M533 Sacrococcygeal disorders, not elsewhere classified: Secondary | ICD-10-CM

## 2016-02-26 DIAGNOSIS — M2550 Pain in unspecified joint: Secondary | ICD-10-CM | POA: Insufficient documentation

## 2016-02-26 DIAGNOSIS — M999 Biomechanical lesion, unspecified: Secondary | ICD-10-CM | POA: Diagnosis not present

## 2016-02-26 LAB — C-REACTIVE PROTEIN: CRP: 0.7 mg/dL (ref 0.5–20.0)

## 2016-02-26 LAB — SEDIMENTATION RATE: Sed Rate: 17 mm/hr (ref 0–20)

## 2016-02-26 MED ORDER — HYDROCHLOROTHIAZIDE 25 MG PO TABS: 25.0000 mg | ORAL_TABLET | Freq: Every day | ORAL | 3 refills | Status: DC

## 2016-02-26 MED FILL — HYDROCHLOROTHIAZIDE 25 MG T: 25 | 90 days supply | Qty: 90 | Fill #0

## 2016-02-26 NOTE — Assessment & Plan Note (Signed)
Has responded really well to osteopathic manipulation. We'll continue with the same conservative therapy at this time. Effexor has been somewhat beneficial as well. Discussed core, ergonomics, lifting mechanics. Follow-up again in 6-8 weeks.

## 2016-02-26 NOTE — Assessment & Plan Note (Signed)
Decision today to treat with OMT was based on Physical Exam  After verbal consent patient was treated with HVLA techniques in thoracic, lumbar and sacral areas  Patient tolerated the procedure well with improvement in symptoms  Patient given exercises, stretches and lifestyle modifications  See medications in patient instructions if given  Patient will follow up in 6-8 weeks

## 2016-02-26 NOTE — Patient Instructions (Addendum)
Good to see you  Happy holidays!  Happy New Year!  You are doing great  We will get labs Lest get some labs and see what is going on with the swelling Keep up the good work otherwise New exercises for your knee.  See me again in 6-8 weeks!!!!

## 2016-02-26 NOTE — Assessment & Plan Note (Signed)
Patient doesn't polyarthralgia noted. We discussed with patient at great length. Patient will continue to be active. We will get labs to further evaluate for any autoimmune disease. Patient's brother does have some type of autoimmune but no true diagnosis at the moment.

## 2016-02-27 LAB — SJOGREN'S SYNDROME ANTIBODS(SSA + SSB)
SSA (Ro) (ENA) Antibody, IgG: <1
SSB (La) (ENA) Antibody, IgG: <1

## 2016-02-27 LAB — CYCLIC CITRUL PEPTIDE ANTIBODY, IGG: Cyclic Citrullin Peptide Ab: <16 Units

## 2016-02-27 LAB — ANA: Anti Nuclear Antibody(ANA): NEGATIVE

## 2016-02-27 LAB — RHEUMATOID FACTOR: Rhuematoid fact SerPl-aCnc: <14 IU/mL (ref <14)

## 2016-02-27 LAB — ANGIOTENSIN CONVERTING ENZYME: ANGIOTENSIN-CONVERTING ENZYME: 37 U/L (ref 9–67)

## 2016-02-28 NOTE — Progress Notes (Unsigned)
.  all

## 2016-03-18 ENCOUNTER — Encounter: Payer: Self-pay | Admitting: Family Medicine

## 2016-03-18 MED ORDER — VENLAFAXINE HCL ER 75 MG PO CP24: 75.0000 mg | ORAL_CAPSULE | Freq: Every day | ORAL | 1 refills | Status: DC

## 2016-03-18 MED FILL — VENLAFAXINE HCL ER 75 MG CA: 75 | 30 days supply | Qty: 30 | Fill #0

## 2016-03-30 ENCOUNTER — Other Ambulatory Visit: Payer: Self-pay | Admitting: Family Medicine

## 2016-03-31 MED FILL — GABAPENTIN 100 MG CAPSULE: 100 | 90 days supply | Qty: 180 | Fill #0

## 2016-03-31 NOTE — Telephone Encounter (Signed)
Refill done.  

## 2016-04-08 ENCOUNTER — Encounter: Payer: Self-pay | Admitting: Family Medicine

## 2016-04-08 ENCOUNTER — Ambulatory Visit (INDEPENDENT_AMBULATORY_CARE_PROVIDER_SITE_OTHER): Payer: 59 | Admitting: Family Medicine

## 2016-04-08 VITALS — BP 114/80 | HR 107 | Ht 63.0 in | Wt 139.0 lb

## 2016-04-08 DIAGNOSIS — M999 Biomechanical lesion, unspecified: Secondary | ICD-10-CM | POA: Diagnosis not present

## 2016-04-08 DIAGNOSIS — M5416 Radiculopathy, lumbar region: Secondary | ICD-10-CM

## 2016-04-08 MED ORDER — KETOROLAC TROMETHAMINE 60 MG/2ML IM SOLN
60.0000 mg | Freq: Once | INTRAMUSCULAR | Status: AC
  Administered 2016-04-08: 60 mg via INTRAMUSCULAR

## 2016-04-08 MED ORDER — TRAMADOL HCL 50 MG PO TABS: 50.0000 mg | ORAL_TABLET | Freq: Three times a day (TID) | ORAL | 0 refills | Status: DC | PRN

## 2016-04-08 MED ORDER — METHYLPREDNISOLONE ACETATE 80 MG/ML IJ SUSP
80.0000 mg | Freq: Once | INTRAMUSCULAR | Status: AC
  Administered 2016-04-08: 80 mg via INTRAMUSCULAR

## 2016-04-08 MED FILL — traMADol HCL 50 MG TABS: 50 | 10 days supply | Qty: 30 | Fill #0

## 2016-04-08 NOTE — Assessment & Plan Note (Signed)
Patient is having worsening symptoms again. Patient does have a positive straight leg test on the left. Patient also try conservative therapy and given Toradol and Depo-Medrol today. Discussed avoiding heavy activity and we'll keep out of work for the next 48 hours. Tramadol refill given today. No significant improvement in the next 48 hours I would consider a repeat epidural with patient responding so well. Has gabapentin for nighttime. Continue the Effexor. Depending on how patient response to this acute treatment we will discuss follow-up soon.

## 2016-04-08 NOTE — Progress Notes (Signed)
[name] [address] [place], [place] [address] Phone: [phone] Subjective:      CC: Low back pain.  [id number]  [name] is a 41 y.o. female coming in with complaint of low back pain. Patient hasn't difficulty with low back pain as well as polyarthralgia. Patient's untoward workup was fairly unremarkable. Was put on Effexor and was making significant improvement. Unfortunately started having increasing blood pressure. Patient decreased her Effexor down to 75 mg starting 2 weeks ago. Patient states Worsening pain overall. Was picking up for 12 pound dog and felt a sharp pain in the right side of her back. Since then has started having some of the mild radicular symptoms going down the left leg. Seems very similar to prior to when she had the epidural. Patient states that it is severe amount of pain 9 out of 10. Affecting daily activities. Has even difficulty getting out of the car. Has been crying herself distally.   is imaging including MRI 08/11/2015. MRI showed slight progression despite protrusion at L5-S1 with a left S1 nerve root impingement. Patient did then have an epidural in 08/21/2015 L4-L5. Patient did have some mild improvement.  Past Medical History:  Diagnosis Date  . Allergy   . Hypertension    Past Surgical History:  Procedure Laterality Date  . CHOLECYSTECTOMY    . TUBAL LIGATION     Social History   Social History  . Marital status: Married    Spouse name: N/A  . Number of children: N/A  . Years of education: N/A   Social History Main Topics  . Smoking status: Never Smoker  . Smokeless tobacco: Never Used  . Alcohol use Yes  . Drug use: No  . Sexual activity: Yes   Other Topics Concern  . 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 Lamisil [Terbinafine Hcl] Hives and Itching  . Terbinafine Hcl Hives   Family History  Problem  Relation Age of Onset  . Cancer Mother     Breast Cancer  . Hypertension Father   . Hyperlipidemia Father   . Other Brother     lyme  . Stroke Maternal Grandmother   . Alcohol abuse Paternal Grandmother   . Heart disease Paternal Grandfather     Past medical history, social, surgical and family history all reviewed in electronic medical record.  No pertanent information unless stated regarding to the chief complaint.   Review of Systems: No headache, visual changes, nausea, vomiting, diarrhea, constipation, dizziness, abdominal pain, skin rash, fevers, chills, night sweats, weight loss, swollen lymph nodes, chest pain, shortness of breath, mood changes.    Objective  Blood pressure 114/80, pulse (!) 107, height 5' 3"  (1.6 m), weight 139 lb (63 kg), SpO2 99 %.   Systems examined below as of 04/08/16 General: NAD A&O x3 mood, affect normal tearful HEENT: Pupils equal, extraocular movements intact no nystagmus Respiratory: not short of breath at rest or with speaking Cardiovascular: No lower extremity edema, non tender Skin: Warm dry intact with no signs of infection or rash on extremities or on axial skeleton. Abdomen: Soft nontender, no masses Neuro: Cranial nerves  intact, neurovascularly intact in all extremities with 2+ DTRs and 2+ pulses. Lymph: No lymphadenopathy appreciated today  Gait Patient is very careful. MSK: Non tender with full range of motion and good stability and symmetric strength and tone  of shoulders, elbows, wrist,  knee hips and ankles bilaterally.   Back Exam:  Inspection: Unremarkable  Motion: Flexion 45 deg, Extension 25 deg, Side Bending to 45 deg bilaterally,  Rotation to 45 deg bilaterally  SLR laying: Positive left XSLR laying: Negative  Palpable tenderness: Tender to palpation in the paraspinal musculature mostly of the right side of the paraspinal musculature lumbar spine.Marland Kitchen FABER: negative. Sensory change: Gross sensation intact to all lumbar and  sacral dermatomes.  Reflexes: 2+ at both patellar tendons, 2+ at achilles tendons, Babinski's downgoing.  Strength at foot  Plantar-flexion: 5/5 Dorsi-flexion: 5/5 Eversion: 5/5 Inversion: 5/5  Leg strength  Quad: 5/5 Hamstring: 5/5 Hip flexor: 5/5 Hip abductors: 5/5   Osteopathic findings Cervical C2 flexed rotated and side bent right C6 flexed rotated and side bent left T3 extended rotated and side bent right inhaled third rib L2 flexed rotated and side bent right Sacrum right on right     Impression and Recommendations:     This case required medical decision making of moderate complexity.      Note: This dictation was prepared with Dragon dictation along with smaller phrase technology. Any transcriptional errors that result from this process are unintentional.

## 2016-04-08 NOTE — Patient Instructions (Addendum)
Good to see you as always.  We will hold you out of work until the 5th of February.  Ice 20 minutes 2 times daily. Usually after activity and before bed. 2 injections today  If not better by Friday send me a message and we will do another epidural.  IF we do the epidural then I want to see you 2 weeks after the epidural  Refilled the tramadol.

## 2016-04-08 NOTE — Assessment & Plan Note (Signed)
Decision today to treat with OMT was based on Physical Exam  After verbal consent patient was treated with HVLA techniques in thoracic, lumbar and sacral areas  Patient tolerated the procedure well with improvement in symptoms  Patient given exercises, stretches and lifestyle modifications  See medications in patient instructions if given  Patient will follow up in 2 weeks

## 2016-04-10 ENCOUNTER — Encounter: Payer: Self-pay | Admitting: Family Medicine

## 2016-04-10 DIAGNOSIS — M5416 Radiculopathy, lumbar region: Secondary | ICD-10-CM

## 2016-04-11 DIAGNOSIS — S335XXA Sprain of ligaments of lumbar spine, initial encounter: Secondary | ICD-10-CM | POA: Diagnosis not present

## 2016-04-14 NOTE — Telephone Encounter (Signed)
Pt called in and has some questions and would like nurse to call her   Best number [phone]

## 2016-04-16 MED FILL — VENLAFAXINE HCL ER 75 MG CA: 75 | 30 days supply | Qty: 30 | Fill #1

## 2016-04-20 ENCOUNTER — Ambulatory Visit
Admission: RE | Admit: 2016-04-20 | Discharge: 2016-04-20 | Disposition: A | Discharge status: Home / Self Care | Payer: 59 | Source: Ambulatory Visit | Attending: Family Medicine | Admitting: Family Medicine

## 2016-04-20 DIAGNOSIS — M5416 Radiculopathy, lumbar region: Secondary | ICD-10-CM | POA: Diagnosis not present

## 2016-04-20 DIAGNOSIS — M47817 Spondylosis without myelopathy or radiculopathy, lumbosacral region: Secondary | ICD-10-CM | POA: Diagnosis not present

## 2016-04-20 MED ORDER — IOPAMIDOL (ISOVUE-M 200) INJECTION 41%
1.0000 mL | Freq: Once | INTRAMUSCULAR | Status: AC
  Administered 2016-04-20: 1 mL via EPIDURAL

## 2016-04-20 MED ORDER — METHYLPREDNISOLONE ACETATE 40 MG/ML INJ SUSP (RADIOLOG
120.0000 mg | Freq: Once | INTRAMUSCULAR | Status: AC
  Administered 2016-04-20: 120 mg via EPIDURAL

## 2016-04-20 NOTE — Discharge Instructions (Signed)

## 2016-05-04 ENCOUNTER — Encounter: Payer: Self-pay | Admitting: *Deleted

## 2016-05-04 ENCOUNTER — Ambulatory Visit (INDEPENDENT_AMBULATORY_CARE_PROVIDER_SITE_OTHER): Payer: 59 | Admitting: Family Medicine

## 2016-05-04 ENCOUNTER — Encounter: Payer: Self-pay | Admitting: Family Medicine

## 2016-05-04 DIAGNOSIS — M5416 Radiculopathy, lumbar region: Secondary | ICD-10-CM | POA: Diagnosis not present

## 2016-05-04 MED ORDER — VENLAFAXINE HCL ER 37.5 MG PO CP24: 37.5000 mg | ORAL_CAPSULE | Freq: Every day | ORAL | 1 refills | Status: DC

## 2016-05-04 MED ORDER — TRAMADOL HCL 50 MG PO TABS: 50.0000 mg | ORAL_TABLET | Freq: Three times a day (TID) | ORAL | 0 refills | Status: DC | PRN

## 2016-05-04 MED FILL — VENLAFAXINE HCL ER 37.5 MG: 37.5 | 30 days supply | Qty: 30 | Fill #0

## 2016-05-04 MED FILL — traMADol HCL 50 MG TABS: 50 | 10 days supply | Qty: 30 | Fill #0

## 2016-05-04 NOTE — Patient Instructions (Signed)
Good to see you I am glad we are making progress.  We will get you back to work.  effexor 37.5 mg daily for 2 weeks  Then go to 3 times a week for 2 weeks.  New exercises for your back 3 times a week See me again in 4 weeks.

## 2016-05-04 NOTE — Progress Notes (Signed)
[name] [address] [place],  [address] Phone: [phone] Subjective:      CC: Low back pain.  HMC:[id number]  [name] is a 41 y.o. female coming in with complaint of low back pain. Patient hasn't difficulty with low back pain as well as polyarthralgia.  Patient was having worsening back pain. Patient elected to try another epidural. Patient states that she is improving again. States and not having as much leg pain especially on the left side. Still some mild numbness in one area but not severe.   is imaging including MRI 08/11/2015. MRI showed slight progression despite protrusion at L5-S1 with a left S1 nerve root impingement. Patient did then have an epidural in 08/21/2015 L4-L5. Patient did have some mild improvement.Patient then had an epidural again in February 12/18   Past Medical History:  Diagnosis Date  . Allergy   . Hypertension    Past Surgical History:  Procedure Laterality Date  . CHOLECYSTECTOMY    . TUBAL LIGATION     Social History   Social History  . Marital status: Married    Spouse name: N/A  . Number of children: N/A  . Years of education: N/A   Social History Main Topics  . Smoking status: Never Smoker  . Smokeless tobacco: Never Used  . Alcohol use Yes  . Drug use: No  . Sexual activity: Yes   Other Topics Concern  . 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 Lamisil [Terbinafine Hcl] Hives and Itching  . Terbinafine Hcl Hives   Family History  Problem Relation Age of Onset  . Cancer Mother     Breast Cancer  . Hypertension Father   . Hyperlipidemia Father   . Other Brother     lyme  . Stroke Maternal Grandmother   . Alcohol abuse Paternal Grandmother   . Heart disease Paternal Grandfather     Past medical history, social, surgical and family history all reviewed in electronic medical record.  No pertanent  information unless stated regarding to the chief complaint.   Review of Systems: No headache, visual changes, nausea, vomiting, diarrhea, constipation, dizziness, abdominal pain, skin rash, fevers, chills, night sweats, weight loss, swollen lymph nodes,chest pain, shortness of breath, mood changes.  Positive muscle aches   Objective  Blood pressure 120/82, height 5' 3"  (1.6 m), weight 140 lb (63.5 kg).   Systems examined below as of 05/04/16 General: NAD A&O x3 mood, affect normal  HEENT: Pupils equal, extraocular movements intact no nystagmus Respiratory: not short of breath at rest or with speaking Cardiovascular: No lower extremity edema, non tender Skin: Warm dry intact with no signs of infection or rash on extremities or on axial skeleton. Abdomen: Soft nontender, no masses Neuro: Cranial nerves  intact, neurovascularly intact in all extremities with 2+ DTRs and 2+ pulses. Lymph: No lymphadenopathy appreciated today  Gait normal with good balance and coordination.  MSK: Non tender with full range of motion and good stability and symmetric strength and tone of shoulders, elbows, wrist,  knee hips and ankles bilaterally.   Back Exam:  Inspection: Unremarkable  Motion: Flexion 45 deg, Extension 25 deg, Side Bending to 45 deg bilaterally,  Rotation to 45 deg bilaterally  SLR laying: Negative XSLR laying: Negative  Palpable tenderness: Mild tenderness noted the paraspinal musculature of the lumbar spine but improved FABER: negative. Sensory  change: Gross sensation intact to all lumbar and sacral dermatomes.  Reflexes: 2+ at both patellar tendons, 2+ at achilles tendons, Babinski's downgoing.  Strength at foot  Plantar-flexion: 5/5 Dorsi-flexion: 5/5 Eversion: 5/5 Inversion: 5/5  Leg strength  Quad: 5/5 Hamstring: 5/5 Hip flexor: 5/5 Hip abductors: 5/5   t     Impression and Recommendations:     This case required medical decision making of moderate complexity.       Note: This dictation was prepared with Dragon dictation along with smaller phrase technology. Any transcriptional errors that result from this process are unintentional.

## 2016-05-04 NOTE — Assessment & Plan Note (Signed)
Discussed with patient again at great length. Patient's is doing relatively better at this time. We discussed icing regimen and home exercises. Patient wants to start to titrate off medications. We wrote this in patient instructions. In addition of this we discussed what activities still to avoid. No manipulation done today. Multiple questions were answered today. Return to clinic in 4 weeks with patient starting to return to work.  Spent  25 minutes with patient face-to-face and had greater than 50% of counseling including as described above in assessment and plan.

## 2016-05-28 MED FILL — AMLODIPINE BESYLATE 5 MG TA: 5 | 90 days supply | Qty: 90 | Fill #1

## 2016-05-28 MED FILL — HYDROCHLOROTHIAZIDE 25 MG T: 25 | 90 days supply | Qty: 90 | Fill #1

## 2016-06-04 ENCOUNTER — Ambulatory Visit: Payer: 59 | Admitting: Family Medicine

## 2016-06-18 ENCOUNTER — Encounter: Payer: Self-pay | Admitting: Family Medicine

## 2016-06-18 ENCOUNTER — Other Ambulatory Visit (INDEPENDENT_AMBULATORY_CARE_PROVIDER_SITE_OTHER): Payer: 59

## 2016-06-18 ENCOUNTER — Ambulatory Visit (INDEPENDENT_AMBULATORY_CARE_PROVIDER_SITE_OTHER): Payer: 59 | Admitting: Family Medicine

## 2016-06-18 VITALS — BP 106/84 | HR 103 | Resp 16 | Wt 139.0 lb

## 2016-06-18 DIAGNOSIS — M5416 Radiculopathy, lumbar region: Secondary | ICD-10-CM | POA: Diagnosis not present

## 2016-06-18 DIAGNOSIS — M2550 Pain in unspecified joint: Secondary | ICD-10-CM

## 2016-06-18 LAB — IBC PANEL
IRON: 38 ug/dL — AB (ref 42–145)
SATURATION RATIOS: 10.3 % — AB (ref 20.0–50.0)
TRANSFERRIN: 264 mg/dL (ref 212.0–360.0)

## 2016-06-18 NOTE — Progress Notes (Signed)
[name] [address] [place], [place] [address] Phone: [phone] Subjective:    CC: Low back pain f/u  [id number]  [name] is a 41 y.o. female coming in with complaint of low back pain. Patient hasn't difficulty with low back pain as well as polyarthralgia.  Patient was having worsening back pain. Patient elected to try another epidural.  Patient was making improvement and continued to have pain. Patient states that the pain seems to be worsening again. Seems to have some radiation going down both legs. Patient is having difficulty overall. Discontinued the Effexor secondary to the side effects. Worsening fatigue recently.   is imaging including MRI 08/11/2015. MRI showed slight progression despite protrusion at L5-S1 with a left S1 nerve root impingement. Patient did then have an epidural in 08/21/2015 L4-L5. Patient did have some mild improvement.Patient then had an epidural again in February 12/18   Past Medical History:  Diagnosis Date  . Allergy   . Hypertension    Past Surgical History:  Procedure Laterality Date  . CHOLECYSTECTOMY    . TUBAL LIGATION     Social History   Social History  . Marital status: Married    Spouse name: N/A  . Number of children: N/A  . Years of education: N/A   Social History Main Topics  . Smoking status: Never Smoker  . Smokeless tobacco: Never Used  . Alcohol use Yes  . Drug use: No  . Sexual activity: Yes   Other Topics Concern  . 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 Lamisil [Terbinafine Hcl] Hives and Itching  . Terbinafine Hcl Hives   Family History  Problem Relation Age of Onset  . Cancer Mother     Breast Cancer  . Hypertension Father   . Hyperlipidemia Father   . Other Brother     lyme  . Stroke Maternal Grandmother   . Alcohol abuse Paternal Grandmother   . Heart disease Paternal Grandfather      Past medical history, social, surgical and family history all reviewed in electronic medical record.  No pertanent information unless stated regarding to the chief complaint.   Review of Systems: No visual changes, nausea, vomiting, diarrhea, constipation, dizziness, abdominal pain, skin rash, fevers, chills, night sweats, weight loss, swollen lymph nodes,chest pain, shortness of breath, mood changes.  Positive muscle aches, body aches, fatigue  Objective  Blood pressure 106/84, pulse (!) 103, resp. rate 16, weight 139 lb (63 kg), SpO2 97 %.   Systems examined below as of 06/18/16 General: NAD A&O x3 mood, affect normal  HEENT: Pupils equal, extraocular movements intact no nystagmus Respiratory: not short of breath at rest or with speaking Cardiovascular: No lower extremity edema, non tender Skin: Warm dry intact with no signs of infection or rash on extremities or on axial skeleton. Abdomen: Soft nontender, no masses Neuro: Cranial nerves  intact, neurovascularly intact in all extremities with 2+ DTRs and 2+ pulses. Lymph: No lymphadenopathy appreciated today  Gait normal with good balance and coordination.  MSK: tender with full range of motion and good stability and symmetric strength and tone of shoulders, elbows, wrist,  knee hips and ankles bilaterally.   Back Exam:  Inspection: Mild loss in lordosis Motion: Flexion 45 deg, Extension 25 deg, Side Bending to 30 deg bilaterally,  Rotation to 30 deg bilaterally  SLR laying: Mild radicular  symptoms and seems to be left greater than right XSLR laying: Negative  Palpable tenderness: Increasing tenderness in the paraspinal musculature throughout the thoracic and lumbar spine now. FABER: Mild tightness on the left side. Sensory change: Gross sensation intact to all lumbar and sacral dermatomes.  Reflexes: 2+ at both patellar tendons, 2+ at achilles tendons, Babinski's downgoing.  Strength at foot  Plantar-flexion: 5/5  Dorsi-flexion: 5/5 Eversion: 5/5 Inversion: 5/5  Leg strength  Quad: 5/5 Hamstring: 5/5 Hip flexor: 5/5 Hip abductors: 5/5   t     Impression and Recommendations:     This case required medical decision making of moderate complexity.      Note: This dictation was prepared with Dragon dictation along with smaller phrase technology. Any transcriptional errors that result from this process are unintentional.

## 2016-06-18 NOTE — Patient Instructions (Signed)
Good to see you  We will get some more labs When you go get shoes look at [facility] as possibilities.  Dr. Maryjean Ka office will be calling you  Mad adjustments to your orthotics I will write you when I get the results.  See me again in 4 weeks.

## 2016-06-18 NOTE — Assessment & Plan Note (Signed)
Patient has had 2 epidurals and has had some mild improvement. Has not made significant improvement on seems to only last a small amount of time. Continues on the gabapentin at night. Will be referred to neurosurgery for other possible interventions including potential radiofrequency ablation or surgical intervention. In addition of this will rule out other organic causes such as a abnormal calcium levels are iron deficiency. Patient will come back and see me again after further evaluation and to discuss other medical management if necessary.  Spent  25 minutes with patient face-to-face and had greater than 50% of counseling including as described above in assessment and plan.

## 2016-06-18 NOTE — Assessment & Plan Note (Signed)
Further labs to rule out any other organic causes such as iron deficiency calcium abnormalities.

## 2016-06-18 NOTE — Progress Notes (Signed)
Pre-visit discussion using our clinic review tool. No additional management support is needed unless otherwise documented below in the visit note.  

## 2016-06-19 ENCOUNTER — Encounter: Payer: Self-pay | Admitting: Family Medicine

## 2016-06-19 ENCOUNTER — Other Ambulatory Visit: Payer: Self-pay | Admitting: Family Medicine

## 2016-06-21 ENCOUNTER — Encounter: Payer: Self-pay | Admitting: Family Medicine

## 2016-06-23 ENCOUNTER — Other Ambulatory Visit: Payer: Self-pay | Admitting: Family Medicine

## 2016-06-23 MED ORDER — TRAMADOL HCL 50 MG PO TABS: 50.0000 mg | ORAL_TABLET | Freq: Three times a day (TID) | ORAL | 0 refills | Status: DC | PRN

## 2016-06-23 MED FILL — raNITIdine HCL 150 MG TABS: 150 | 90 days supply | Qty: 180 | Fill #1

## 2016-06-23 NOTE — Telephone Encounter (Signed)
Last OV 06/18/2016 Next OV: 07/13/2016 LAST refilled 05/04/2016 Please advise

## 2016-06-23 NOTE — Telephone Encounter (Signed)
Sent to [facility].

## 2016-06-25 ENCOUNTER — Other Ambulatory Visit: Payer: Self-pay | Admitting: Family Medicine

## 2016-06-25 NOTE — Telephone Encounter (Signed)
Last refille 06/23/16  Last OV 06/18/16 Please advise

## 2016-07-01 ENCOUNTER — Telehealth: Payer: Self-pay | Admitting: Internal Medicine

## 2016-07-01 NOTE — Telephone Encounter (Signed)
[facility] called and they never received her Tramadol on the 4/17. Please resend

## 2016-07-01 NOTE — Telephone Encounter (Signed)
Previous faxed copy is in [facility] desk---I have refaxed over to pharm

## 2016-07-02 ENCOUNTER — Encounter: Payer: Self-pay | Admitting: Family Medicine

## 2016-07-02 MED FILL — traMADol HCL 50 MG TABS: 50 | 10 days supply | Qty: 30 | Fill #0

## 2016-07-08 DIAGNOSIS — M545 Low back pain: Secondary | ICD-10-CM | POA: Diagnosis not present

## 2016-07-08 DIAGNOSIS — G8929 Other chronic pain: Secondary | ICD-10-CM | POA: Diagnosis not present

## 2016-07-10 ENCOUNTER — Other Ambulatory Visit: Payer: Self-pay | Admitting: Obstetrics and Gynecology

## 2016-07-10 DIAGNOSIS — Z1231 Encounter for screening mammogram for malignant neoplasm of breast: Secondary | ICD-10-CM

## 2016-07-11 NOTE — Progress Notes (Signed)
[name] [address] [place], [place] [address] Phone: [phone] Subjective:    CC: Low back pain f/u  [id number]  [name] is a 41 y.o. female coming in with complaint of low back pain. Patient hasn't difficulty with low back pain as well as polyarthralgia.  Patient was having worsening back pain. Patient elected to try another epidural.  Patient was making improvement and continued to have pain. Patient states that the pain seems to be worsening again. Seems to have some radiation going down both legs. Patient is having difficulty overall. Discontinued the Effexor secondary to the side effects. Worsening fatigue recently. Was to have epidural Patient went and saw the pain management physician and they are going to go through with the epidural. Patient states that overall she seems to be doing stable. Considering Cymbalta as well body of the provider. Has not started taking it yet. Depending on how this does patient will continue to monitor and we'll consider other treatment options as well but once to avoid surgery. Has responded somewhat to manipulation previously.    is imaging including MRI 08/11/2015. MRI showed slight progression despite protrusion at L5-S1 with a left S1 nerve root impingement. Patient did then have an epidural in 08/21/2015 L4-L5. Patient did have some mild improvement.Patient then had an epidural again in February 12/18   Past Medical History:  Diagnosis Date  . Allergy   . Hypertension    Past Surgical History:  Procedure Laterality Date  . CHOLECYSTECTOMY    . TUBAL LIGATION     Social History   Social History  . Marital status: Married    Spouse name: N/A  . Number of children: N/A  . Years of education: N/A   Social History Main Topics  . Smoking status: Never Smoker  . Smokeless tobacco: Never Used  . Alcohol use Yes  . Drug use: No  . Sexual activity: Yes   Other Topics Concern  . 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 Lamisil [Terbinafine Hcl] Hives and Itching  . Terbinafine Hcl Hives   Family History  Problem Relation Age of Onset  . Cancer Mother     Breast Cancer  . Hypertension Father   . Hyperlipidemia Father   . Other Brother     lyme  . Stroke Maternal Grandmother   . Alcohol abuse Paternal Grandmother   . Heart disease Paternal Grandfather     Past medical history, social, surgical and family history all reviewed in electronic medical record.  No pertanent information unless stated regarding to the chief complaint.   Review of Systems: No headache, visual changes, nausea, vomiting, diarrhea, constipation, dizziness, abdominal pain, skin rash, fevers, chills, night sweats, weight loss, swollen lymph nodes, body aches, joint swelling, muscle aches, chest pain, shortness of breath, mood changes.    Objective  Blood pressure 102/74, pulse 72, resp. rate 16, weight 138 lb 8 oz (62.8 kg), SpO2 98 %.   Systems examined below as of 07/13/16 General: NAD A&O x3 mood, affect normal  HEENT: Pupils equal, extraocular movements intact no nystagmus Respiratory: not short of breath at rest or with speaking Cardiovascular: No lower extremity edema, non tender Skin: Warm dry intact with no signs of infection or rash on extremities or on axial skeleton. Abdomen: Soft nontender, no masses Neuro: Cranial nerves  intact, neurovascularly intact in all  extremities with 2+ DTRs and 2+ pulses. Lymph: No lymphadenopathy appreciated today  Gait normal with good balance and coordination.  MSK: Non tender with full range of motion and good stability and symmetric strength and tone of shoulders, elbows, wrist,  knee hips and ankles bilaterally.   Back Exam:  Inspection: Mild loss in lordosis Motion: Flexion 45 deg, Extension 25 deg, Side Bending to 30 deg bilaterally,  Rotation to 30 deg bilaterally  SLR  laying: Still minor radicular symptoms on the left sign XSLR laying: Negative  Palpable tenderness: More tightness on the per spinal musculature of the left side of the lumbar spine. FABER: Tightness on the left Sensory change: Gross sensation intact to all lumbar and sacral dermatomes.  Reflexes: 2+ at both patellar tendons, 2+ at achilles tendons, Babinski's downgoing.  Strength at foot  Plantar-flexion: 5/5 Dorsi-flexion: 5/5 Eversion: 5/5 Inversion: 5/5  Leg strength  Quad: 5/5 Hamstring: 5/5 Hip flexor: 5/5 Hip abductors: 5/5   Osteopathic findings C5 flexed rotated and side bent left T4 extended rotated and side bent right inhaled rib T5 extended rotated and side bent left L2 flexed rotated and side bent right Sacrum right on right      Impression and Recommendations:     This case required medical decision making of moderate complexity.      Note: This dictation was prepared with Dragon dictation along with smaller phrase technology. Any transcriptional errors that result from this process are unintentional.

## 2016-07-13 ENCOUNTER — Encounter: Payer: Self-pay | Admitting: Family Medicine

## 2016-07-13 ENCOUNTER — Ambulatory Visit (INDEPENDENT_AMBULATORY_CARE_PROVIDER_SITE_OTHER): Payer: 59 | Admitting: Family Medicine

## 2016-07-13 VITALS — BP 102/74 | HR 72 | Resp 16 | Wt 138.5 lb

## 2016-07-13 DIAGNOSIS — M545 Low back pain: Secondary | ICD-10-CM | POA: Diagnosis not present

## 2016-07-13 DIAGNOSIS — M533 Sacrococcygeal disorders, not elsewhere classified: Secondary | ICD-10-CM

## 2016-07-13 DIAGNOSIS — M999 Biomechanical lesion, unspecified: Secondary | ICD-10-CM

## 2016-07-13 NOTE — Patient Instructions (Signed)
Good to see you  I hope the injection goes well Continue the iron  See me again in 4-6 weeks Send me a message in 3-4 weeks.

## 2016-07-13 NOTE — Assessment & Plan Note (Signed)
Stable. Has not responded well to the injections but does respond somewhat to the manipulation. Discussed with patient at great length. Patient is been fairly active at this point. Patient continued to try to improve. We will see how patient response to the other pain management interventions. We discussed icing regimen. Patient will come back and see me again in 4-8 weeks.

## 2016-07-13 NOTE — Assessment & Plan Note (Signed)
Decision today to treat with OMT was based on Physical Exam  After verbal consent patient was treated with HVLA, ME, FPR techniques in cervical, thoracic, lumbar and sacral areas  Patient tolerated the procedure well with improvement in symptoms  Patient given exercises, stretches and lifestyle modifications  See medications in patient instructions if given  Patient will follow up in 4-8 weeks

## 2016-07-29 ENCOUNTER — Ambulatory Visit
Admission: RE | Admit: 2016-07-29 | Discharge: 2016-07-29 | Disposition: A | Discharge status: Home / Self Care | Payer: 59 | Source: Ambulatory Visit | Attending: Obstetrics and Gynecology | Admitting: Obstetrics and Gynecology

## 2016-07-29 DIAGNOSIS — Z1231 Encounter for screening mammogram for malignant neoplasm of breast: Secondary | ICD-10-CM

## 2016-08-14 DIAGNOSIS — G8929 Other chronic pain: Secondary | ICD-10-CM | POA: Diagnosis not present

## 2016-08-14 DIAGNOSIS — M545 Low back pain: Secondary | ICD-10-CM | POA: Diagnosis not present

## 2016-08-14 DIAGNOSIS — I10 Essential (primary) hypertension: Secondary | ICD-10-CM | POA: Diagnosis not present

## 2016-08-19 ENCOUNTER — Other Ambulatory Visit: Payer: Self-pay | Admitting: Family Medicine

## 2016-08-20 ENCOUNTER — Other Ambulatory Visit: Payer: Self-pay | Admitting: Family Medicine

## 2016-08-20 ENCOUNTER — Encounter: Payer: Self-pay | Admitting: Family Medicine

## 2016-08-20 MED ORDER — TRAMADOL HCL 50 MG PO TABS: 50.0000 mg | ORAL_TABLET | Freq: Three times a day (TID) | ORAL | 0 refills | Status: DC | PRN

## 2016-08-20 NOTE — Telephone Encounter (Signed)
Refill done.  

## 2016-08-21 ENCOUNTER — Encounter: Payer: Self-pay | Admitting: Family Medicine

## 2016-08-21 MED ORDER — TRAMADOL HCL 50 MG PO TABS: 50.0000 mg | ORAL_TABLET | Freq: Three times a day (TID) | ORAL | 0 refills | Status: DC | PRN

## 2016-08-21 NOTE — Telephone Encounter (Signed)
[place] controlled substance database checked.  Ok to fill medication. rx printed

## 2016-08-21 NOTE — Telephone Encounter (Signed)
RX faxed to [facility] out pt.

## 2016-08-31 ENCOUNTER — Other Ambulatory Visit: Payer: Self-pay | Admitting: Family Medicine

## 2016-08-31 MED FILL — traMADol HCL 50 MG TABS: 50 | 10 days supply | Qty: 30 | Fill #0

## 2016-08-31 MED FILL — HYDROCHLOROTHIAZIDE 25 MG T: 25 | 90 days supply | Qty: 90 | Fill #2

## 2016-08-31 NOTE — Telephone Encounter (Signed)
Per dr Tamala Julian. Refill denied. Pt is no longer taking amlodipine.

## 2016-09-04 ENCOUNTER — Encounter: Payer: Self-pay | Admitting: Internal Medicine

## 2016-09-05 MED ORDER — AMLODIPINE BESYLATE 5 MG PO TABS: 5.0000 mg | ORAL_TABLET | Freq: Every day | ORAL | 1 refills | Status: DC

## 2016-09-07 MED FILL — AMLODIPINE BESYLATE 5 MG TA: 5 | 90 days supply | Qty: 90 | Fill #0

## 2016-09-28 ENCOUNTER — Other Ambulatory Visit: Payer: Self-pay | Admitting: Internal Medicine

## 2016-09-28 MED FILL — NITROFURANTOIN MCR 50 MG CA: 50 | 45 days supply | Qty: 45 | Fill #0

## 2016-10-01 ENCOUNTER — Other Ambulatory Visit: Payer: Self-pay | Admitting: Internal Medicine

## 2016-10-01 ENCOUNTER — Encounter: Payer: Self-pay | Admitting: Family Medicine

## 2016-10-01 MED ORDER — TRAMADOL HCL 50 MG PO TABS: 50.0000 mg | ORAL_TABLET | Freq: Three times a day (TID) | ORAL | 0 refills | Status: DC | PRN

## 2016-10-01 NOTE — Telephone Encounter (Signed)
Done hardcopy to [facility]  

## 2016-10-01 NOTE — Telephone Encounter (Signed)
Faxed

## 2016-10-01 NOTE — Telephone Encounter (Signed)
Check  registry last filled 08/31/2016 for 10 day supply. Pls advise in MD abscence ...Johny Chess

## 2016-10-10 NOTE — Progress Notes (Deleted)
[name] [address] [place], [place] [address] Phone: [phone] Subjective:    CC: Low back pain f/u  [id number]  [name] is a 41 y.o. female coming in with complaint of low back pain. Patient hasn't difficulty with low back pain as well as polyarthralgia.  Patient was having worsening back pain. Patient elected to try another epidural.  Patient was making improvement and continued to have pain. Patient states that the pain seems to be worsening again. Seems to have some radiation going down both legs. Patient is having difficulty overall. Discontinued the Effexor secondary to the side effects. Worsening fatigue recently. Was to have epidural Patient went and saw the pain management physician and they are going to go through with the epidural. Patient states that overall she seems to be doing stable. Considering Cymbalta as well body of the provider. Has not started taking it yet. Depending on how this does patient will continue to monitor and we'll consider other treatment options as well but once to avoid surgery. Has responded somewhat to manipulation previously.    is imaging including MRI 08/11/2015. MRI showed slight progression despite protrusion at L5-S1 with a left S1 nerve root impingement. Patient did then have an epidural in 08/21/2015 L4-L5. Patient did have some mild improvement.Patient then had an epidural again in February 12/18   Past Medical History:  Diagnosis Date  . Allergy   . Hypertension    Past Surgical History:  Procedure Laterality Date  . CHOLECYSTECTOMY    . TUBAL LIGATION     Social History   Social History  . Marital status: Married    Spouse name: N/A  . Number of children: N/A  . Years of education: N/A   Social History Main Topics  . Smoking status: Never Smoker  . Smokeless tobacco: Never Used  . Alcohol use Yes  . Drug use: No  . Sexual activity: Yes   Other Topics Concern  . 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 Lamisil [Terbinafine Hcl] Hives and Itching  . Terbinafine Hcl Hives   Family History  Problem Relation Age of Onset  . Cancer Mother        Breast Cancer  . Breast cancer Mother   . Hypertension Father   . Hyperlipidemia Father   . Other Brother        lyme  . Stroke Maternal Grandmother   . Alcohol abuse Paternal Grandmother   . Heart disease Paternal Grandfather     Past medical history, social, surgical and family history all reviewed in electronic medical record.  No pertanent information unless stated regarding to the chief complaint.   Review of Systems: No headache, visual changes, nausea, vomiting, diarrhea, constipation, dizziness, abdominal pain, skin rash, fevers, chills, night sweats, weight loss, swollen lymph nodes, body aches, joint swelling, muscle aches, chest pain, shortness of breath, mood changes.    Objective  There were no vitals taken for this visit.   Systems examined below as of 10/10/16 General: NAD A&O x3 mood, affect normal  HEENT: Pupils equal, extraocular movements intact no nystagmus Respiratory: not short of breath at rest or with speaking Cardiovascular: No lower extremity edema, non tender Skin: Warm dry intact with no signs of infection or rash on extremities or on axial skeleton. Abdomen: Soft nontender, no masses Neuro: Cranial nerves  intact,  neurovascularly intact in all extremities with 2+ DTRs and 2+ pulses. Lymph: No lymphadenopathy appreciated today  Gait normal with good balance and coordination.  MSK: Non tender with full range of motion and good stability and symmetric strength and tone of shoulders, elbows, wrist,  knee hips and ankles bilaterally.   Back Exam:  Inspection: Mild loss in lordosis Motion: Flexion 45 deg, Extension 25 deg, Side Bending to 30 deg bilaterally,  Rotation to 30 deg bilaterally  SLR  laying: Still minor radicular symptoms on the left sign XSLR laying: Negative  Palpable tenderness: More tightness on the per spinal musculature of the left side of the lumbar spine. FABER: Tightness on the left Sensory change: Gross sensation intact to all lumbar and sacral dermatomes.  Reflexes: 2+ at both patellar tendons, 2+ at achilles tendons, Babinski's downgoing.  Strength at foot  Plantar-flexion: 5/5 Dorsi-flexion: 5/5 Eversion: 5/5 Inversion: 5/5  Leg strength  Quad: 5/5 Hamstring: 5/5 Hip flexor: 5/5 Hip abductors: 5/5   Osteopathic findings C5 flexed rotated and side bent left T4 extended rotated and side bent right inhaled rib T5 extended rotated and side bent left L2 flexed rotated and side bent right Sacrum right on right      Impression and Recommendations:     This case required medical decision making of moderate complexity.      Note: This dictation was prepared with Dragon dictation along with smaller phrase technology. Any transcriptional errors that result from this process are unintentional.

## 2016-10-12 ENCOUNTER — Ambulatory Visit: Payer: 59 | Admitting: Family Medicine

## 2016-10-14 MED FILL — GABAPENTIN 100 MG CAP: 100 | 90 days supply | Qty: 180 | Fill #1

## 2016-10-14 MED FILL — traMADol HCL 50 MG TABS: 50 | 10 days supply | Qty: 30 | Fill #0

## 2016-10-28 NOTE — Progress Notes (Signed)
[name] [place], [place] [address] Phone: [phone] Subjective:    CC: Low back pain f/u  [id number]  [name] is a 41 y.o. female coming in with complaint of low back pain. Patient hasn't difficulty with low back pain as well as polyarthralgia.  Patient was having worsening back pain. Patient elected to try another epidural.  Patient was making improvement and continued to have pain. Patient states that the pain seems to be worsening again. Seems to have some radiation going down both legs. Patient is having difficulty overall. Discontinued the Effexor secondary to the side effects. Worsening fatigue recently. Was to have epidural Patient went and saw the pain management physician and they are going to go through with the epidural. Patient states that overall she seems to be doing stable. Considering Cymbalta as well body of the provider. Has not started taking it yet. Depending on how this does patient will continue to monitor and we'll consider other treatment options as well but once to avoid surgery. Has responded somewhat to manipulation previously. Patient continues to have aching pain. Feels unstable tramadol and she is taking once daily seems to be helpful and she wants to increase. Patient is attempting to potentially reschedule for another epidural because the back pain seems to be worsening. Patient states that overall she is just does not feel like she can sometimes make it through the day with the amount of discomfort and pain.    is imaging including MRI 08/11/2015. MRI showed slight progression despite protrusion at L5-S1 with a left S1 nerve root impingement. Patient did then have an epidural in 08/21/2015 L4-L5. Patient did have some mild improvement.Patient then had an epidural again in February 12/18   Past Medical History:  Diagnosis Date  . Allergy   . Hypertension    Past Surgical History:  Procedure Laterality  Date  . CHOLECYSTECTOMY    . TUBAL LIGATION     Social History   Social History  . Marital status: Married    Spouse name: N/A  . Number of children: N/A  . Years of education: N/A   Social History Main Topics  . Smoking status: Never Smoker  . Smokeless tobacco: Never Used  . Alcohol use Yes  . Drug use: No  . Sexual activity: Yes   Other Topics Concern  . 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 Lamisil [Terbinafine Hcl] Hives and Itching  . Terbinafine Hcl Hives   Family History  Problem Relation Age of Onset  . Cancer Mother        Breast Cancer  . Breast cancer Mother   . Hypertension Father   . Hyperlipidemia Father   . Other Brother        lyme  . Stroke Maternal Grandmother   . Alcohol abuse Paternal Grandmother   . Heart disease Paternal Grandfather     Past medical history, social, surgical and family history all reviewed in electronic medical record.  No pertanent information unless stated regarding to the chief complaint.   Review of Systems: No  visual changes, nausea, vomiting, diarrhea, constipation, dizziness, abdominal pain, skin rash, fevers, chills, night sweats, weight loss, swollen lymph nodes,  chest pain, shortness of breath, mood changes.  Positive body aches, joint swelling, muscle aches, headaches   Objective  Blood pressure 110/82, pulse 78, weight 136 lb (  61.7 kg).   Systems examined below as of 10/29/16 General: NAD A&O x3 mood, affect normal  HEENT: Pupils equal, extraocular movements intact no nystagmus Respiratory: not short of breath at rest or with speaking Cardiovascular: No lower extremity edema, non tender Skin: Warm dry intact with no signs of infection or rash on extremities or on axial skeleton. Abdomen: Soft nontender, no masses Neuro: Cranial nerves  intact, neurovascularly intact in all extremities with 2+ DTRs and 2+ pulses. Lymph: No  lymphadenopathy appreciated today  Gait normal with good balance and coordination.  MSK: Moderate tender with full range of motion and good stability and symmetric strength and tone of shoulders, elbows, wrist,  knee hips and ankles bilaterally.  More discomfort in the knees bilaterally Back Exam:  Inspection: Loss of lordosis Motion: Flexion 35 deg, Extension 15 deg, Side Bending to 35 deg bilaterally,  Rotation to 35 deg bilaterally  SLR laying: Negative  XSLR laying: Negative  Palpable tenderness: Tender to palpation of the paraspinal musculature lumbar spine.Marland Kitchen FABER: negative. Sensory change: Gross sensation intact to all lumbar and sacral dermatomes.  Reflexes: 2+ at both patellar tendons, 2+ at achilles tendons, Babinski's downgoing.  Strength at foot  Plantar-flexion: 5/5 Dorsi-flexion: 5/5 Eversion: 5/5 Inversion: 5/5  Leg strength  Quad: 5/5 Hamstring: 5/5 Hip flexor: 5/5 Hip abductors: 5/5  Gait unremarkable.   Osteopathic findings C2 flexed rotated and side bent right C7 flexed rotated and side bent left T3 extended rotated and side bent right inhaled third rib T6 extended rotated and side bent left L2 flexed rotated and side bent right Sacrum right on right       Impression and Recommendations:     This case required medical decision making of moderate complexity.      Note: This dictation was prepared with Dragon dictation along with smaller phrase technology. Any transcriptional errors that result from this process are unintentional.

## 2016-10-29 ENCOUNTER — Ambulatory Visit (INDEPENDENT_AMBULATORY_CARE_PROVIDER_SITE_OTHER): Payer: 59 | Admitting: Family Medicine

## 2016-10-29 ENCOUNTER — Other Ambulatory Visit (INDEPENDENT_AMBULATORY_CARE_PROVIDER_SITE_OTHER): Payer: 59

## 2016-10-29 ENCOUNTER — Encounter: Payer: Self-pay | Admitting: Family Medicine

## 2016-10-29 VITALS — BP 110/82 | HR 78 | Wt 136.0 lb

## 2016-10-29 DIAGNOSIS — M2550 Pain in unspecified joint: Secondary | ICD-10-CM | POA: Diagnosis not present

## 2016-10-29 LAB — T4, FREE: Free T4: 0.95 ng/dL (ref 0.60–1.60)

## 2016-10-29 LAB — VITAMIN D 25 HYDROXY (VIT D DEFICIENCY, FRACTURES): VITD: 48.89 ng/mL (ref 30.00–100.00)

## 2016-10-29 LAB — SEDIMENTATION RATE: Sed Rate: 31 mm/hr — ABNORMAL HIGH (ref 0–20)

## 2016-10-29 LAB — TSH: TSH: 1.23 u[IU]/mL (ref 0.35–4.50)

## 2016-10-29 LAB — T3, FREE: T3 FREE: 3.5 pg/mL (ref 2.3–4.2)

## 2016-10-29 LAB — IRON: Iron: 111 ug/dL (ref 42–145)

## 2016-10-29 MED ORDER — TRAMADOL HCL 50 MG PO TABS: 50.0000 mg | ORAL_TABLET | Freq: Three times a day (TID) | ORAL | 0 refills | Status: DC | PRN

## 2016-10-29 MED ORDER — DULOXETINE HCL 20 MG PO CPEP: 20.0000 mg | ORAL_CAPSULE | Freq: Every day | ORAL | 1 refills | Status: DC

## 2016-10-29 MED FILL — DULoxetine HCL 20 MG CPEP: 20 | 30 days supply | Qty: 30 | Fill #0

## 2016-10-29 MED FILL — traMADol HCL 50 MG TABS: 50 | 10 days supply | Qty: 30 | Fill #0

## 2016-10-29 NOTE — Assessment & Plan Note (Signed)
Continues to have significant amount of pain and does state that she has some swelling of the joints from time to time. Discussed with patient again at great length. Patient is going to have laboratory workup done second due to the worsening symptoms. Encourage her to follow-up to have another epidural done for the lumbar radiculopathy. Patient's back that does not show signs of any type of positive straight leg test today. Patient though does have pain though seems to be out of proportion to the amount of palpation. We discussed different treatment options and patient has elected to try the Cymbalta. Patient will do this. Patient will follow-up with me again in 4 weeks

## 2016-10-29 NOTE — Patient Instructions (Signed)
I am so sorry you are hurting.  Ask about another epidural  Cymbalta 20 mg daily  Tramadol still ok if needed  We will get labs again today and make sure nothing else is going on  See me again in 4 weeks.

## 2016-10-30 LAB — ANTI-DNA ANTIBODY, DOUBLE-STRANDED: ds DNA Ab: 3 IU/mL

## 2016-10-30 LAB — ANTI-SCLERODERMA ANTIBODY: SCLERODERMA (SCL-70) (ENA) ANTIBODY, IGG: NEGATIVE

## 2016-10-30 LAB — RHEUMATOID FACTOR

## 2016-10-30 LAB — ANA: ANA: NEGATIVE

## 2016-10-30 NOTE — Progress Notes (Unsigned)
Once

## 2016-11-23 DIAGNOSIS — Z0279 Encounter for issue of other medical certificate: Secondary | ICD-10-CM

## 2016-11-30 ENCOUNTER — Encounter: Payer: Self-pay | Admitting: Family Medicine

## 2016-11-30 ENCOUNTER — Ambulatory Visit (INDEPENDENT_AMBULATORY_CARE_PROVIDER_SITE_OTHER): Payer: 59 | Admitting: Family Medicine

## 2016-11-30 VITALS — BP 128/84 | HR 86 | Ht 63.0 in | Wt 137.0 lb

## 2016-11-30 DIAGNOSIS — M999 Biomechanical lesion, unspecified: Secondary | ICD-10-CM

## 2016-11-30 DIAGNOSIS — M533 Sacrococcygeal disorders, not elsewhere classified: Secondary | ICD-10-CM | POA: Diagnosis not present

## 2016-11-30 MED ORDER — DULOXETINE HCL 30 MG PO CPEP: 30.0000 mg | ORAL_CAPSULE | Freq: Every day | ORAL | 3 refills | Status: DC

## 2016-11-30 MED FILL — DULoxetine HCL 30 MG CPEP: 30 | 30 days supply | Qty: 30 | Fill #0

## 2016-11-30 NOTE — Progress Notes (Signed)
[name] [address] [place], [place] [address] Phone: [phone] Subjective:    I'm seeing this patient by the request  of:    CC:  Chronic pain follow-up  [id number]  SHAINNA FAUX is a 41 y.o. female coming in with complaint of chronic back pain. Has had epidurals previously. Seen in pain management physician for this. Patient is also started on Cymbalta. This is at last exam one month ago. Patient states Possibly some improvement. Patient's incision is anything everything seems to hurt a little less. Still has had intermittent swelling of the fingers as well as knees but states that it may be less. Patient still has more of a chronic pain. Possibly helping her a little bit with her anxiety as well.     Past Medical History:  Diagnosis Date  . Allergy   . Hypertension    Past Surgical History:  Procedure Laterality Date  . CHOLECYSTECTOMY    . TUBAL LIGATION     Social History   Social History  . Marital status: Married    Spouse name: N/A  . Number of children: N/A  . Years of education: N/A   Social History Main Topics  . Smoking status: Never Smoker  . Smokeless tobacco: Never Used  . Alcohol use Yes  . Drug use: No  . Sexual activity: Yes   Other Topics Concern  . 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 Lamisil [Terbinafine Hcl] Hives and Itching  . Terbinafine Hcl Hives   Family History  Problem Relation Age of Onset  . Cancer Mother        Breast Cancer  . Breast cancer Mother   . Hypertension Father   . Hyperlipidemia Father   . Other Brother        lyme  . Stroke Maternal Grandmother   . Alcohol abuse Paternal Grandmother   . Heart disease Paternal Grandfather      Past medical history, social, surgical and family history all reviewed in electronic medical record.  No pertanent information unless stated regarding to  the chief complaint.   Review of Systems:Review of systems updated and as accurate as of 11/30/16  No headache, visual changes, nausea, vomiting, diarrhea, constipation, dizziness, abdominal pain, skin rash, fevers, chills, night sweats, weight loss, swollen lymph nodes, body aches, joint swelling, chest pain, shortness of breath, mood changes. Positive muscle aches  Objective  There were no vitals taken for this visit. Systems examined below as of 11/30/16   General: No apparent distress alert and oriented x3 mood and affect normal, dressed appropriately.  HEENT: Pupils equal, extraocular movements intact  Respiratory: Patient's speak in full sentences and does not appear short of breath  Cardiovascular: No lower extremity edema, non tender, no erythema  Skin: Warm dry intact with no signs of infection or rash on extremities or on axial skeleton.  Abdomen: Soft nontender  Neuro: Cranial nerves II through XII are intact, neurovascularly intact in all extremities with 2+ DTRs and 2+ pulses.  Lymph: No lymphadenopathy of posterior or anterior cervical chain or axillae bilaterally.  Gait normal with good balance and coordination.  MSK:  Mild tender with full range of motion and good stability and symmetric strength and tone oshoulders, elbows, wrist, hip, knee and ankles bilaterally. f  Back Exam:  Inspection: Unremarkable  Motion: Flexion 35 deg, Extension  25 deg, Side Bending to 35 deg bilaterally,  Rotation to 35 deg bilaterally  SLR laying: Negative  XSLR laying: Negative  Palpable tenderness: Tender to palpation in the paraspinal musculature lumbar spine record of the left. FABER: negative. Sensory change: Gross sensation intact to all lumbar and sacral dermatomes.  Reflexes: 2+ at both patellar tendons, 2+ at achilles tendons, Babinski's downgoing.  Strength at foot  Plantar-flexion: 5/5 Dorsi-flexion: 5/5 Eversion: 5/5 Inversion: 5/5  Leg strength  Quad: 5/5 Hamstring: 5/5 Hip  flexor: 5/5 Hip abductors: 4/5 symmetric Gait unremarkable.  Osteopathic findings C2 flexed rotated and side bent right C4 flexed rotated and side bent left C7 flexed rotated and side bent left T7 extended rotated and side bent left L3 flexed rotated and side bent right Sacrum left on left Pelvic shear noted.     Impression and Recommendations:     This case required medical decision making of moderate complexity.      Note: This dictation was prepared with Dragon dictation along with smaller phrase technology. Any transcriptional errors that result from this process are unintentional.

## 2016-11-30 NOTE — Patient Instructions (Addendum)
I am glad we are making some progress  Ice is your friend.  Lets increase to 30 mg of Cymbalta Watch blood pressure 3 times a week.  I hope the epidural goes well.  See me again in 4 weeks.

## 2016-11-30 NOTE — Assessment & Plan Note (Addendum)
Decision today to treat with OMT was based on Physical Exam  After verbal consent patient was treated with HVLA, ME, FPR techniques in cervical, thoracic, lumbar and sacral, pevic areas  Patient tolerated the procedure well with improvement in symptoms  Patient given exercises, stretches and lifestyle modifications  See medications in patient instructions if given  Patient will follow up in 4 weeks

## 2016-11-30 NOTE — Assessment & Plan Note (Signed)
Discussed with patient at great length. We discussed polyarthralgia. Patient will continue on the Cymbalta will increase to 30 kg daily. Continue all other conservative therapy. Follow-up with me again in 3-4 weeks.

## 2016-12-07 DIAGNOSIS — M545 Low back pain: Secondary | ICD-10-CM | POA: Diagnosis not present

## 2016-12-07 MED FILL — AMLODIPINE BESYLATE 5 MG TA: 5 | 90 days supply | Qty: 90 | Fill #1

## 2016-12-07 MED FILL — HYDROCHLOROTHIAZIDE 25 MG T: 25 | 90 days supply | Qty: 90 | Fill #3

## 2016-12-30 ENCOUNTER — Encounter: Payer: Self-pay | Admitting: Family Medicine

## 2016-12-30 ENCOUNTER — Ambulatory Visit (INDEPENDENT_AMBULATORY_CARE_PROVIDER_SITE_OTHER): Payer: 59 | Admitting: Family Medicine

## 2016-12-30 VITALS — BP 118/90 | HR 100 | Ht 63.0 in | Wt 135.0 lb

## 2016-12-30 DIAGNOSIS — M999 Biomechanical lesion, unspecified: Secondary | ICD-10-CM | POA: Diagnosis not present

## 2016-12-30 DIAGNOSIS — M2550 Pain in unspecified joint: Secondary | ICD-10-CM

## 2016-12-30 MED ORDER — DULOXETINE HCL 40 MG PO CPEP: 40.0000 mg | ORAL_CAPSULE | Freq: Every day | ORAL | 3 refills | Status: DC

## 2016-12-30 MED FILL — DULoxetine HCL 20 MG CPEP: 20 | 30 days supply | Qty: 60 | Fill #0

## 2016-12-30 NOTE — Progress Notes (Signed)
[name] [address] [place], [place] [address] Phone: [phone] Subjective:      CC:  Chronic pain follow-up  [id number]  [name] is a 41 y.o. female coming in with complaint of chronic back pain. Has had epidurals previously. Seen in pain management physician for this. Patient is also started on Cymbalta.Patient was making some improvement. Patient did also have an increase to 30 mg at last visit. Patient was to continue all other conservative therapy. Patient states that her back is slightly better. Autoimmune symptoms have improved with the Cymbalta. Patient has not notice any significant side effects of except for some mild constipation.     Past Medical History:  Diagnosis Date  . Allergy   . Hypertension    Past Surgical History:  Procedure Laterality Date  . CHOLECYSTECTOMY    . TUBAL LIGATION     Social History   Social History  . Marital status: Married    Spouse name: N/A  . Number of children: N/A  . Years of education: N/A   Social History Main Topics  . Smoking status: Never Smoker  . Smokeless tobacco: Never Used  . Alcohol use Yes  . Drug use: No  . Sexual activity: Yes   Other Topics Concern  . 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 Lamisil [Terbinafine Hcl] Hives and Itching  . Terbinafine Hcl Hives   Family History  Problem Relation Age of Onset  . Cancer Mother        Breast Cancer  . Breast cancer Mother   . Hypertension Father   . Hyperlipidemia Father   . Other Brother        lyme  . Stroke Maternal Grandmother   . Alcohol abuse Paternal Grandmother   . Heart disease Paternal Grandfather      Past medical history, social, surgical and family history all reviewed in electronic medical record.  No pertanent information unless stated regarding to the chief complaint.   Review of Systems:Review of systems  updated and as accurate as of 12/30/16  No headache, visual changes, nausea, vomiting, diarrhea, constipation, dizziness, abdominal pain, skin rash, fevers, chills, night sweats, weight loss, swollen lymph nodes, body aches, joint swelling, chest pain, shortness of breath, mood changes. Positive muscle aches  Objective  Blood pressure 118/90, pulse 100, height 5' 3"  (1.6 m), weight 135 lb (61.2 kg), SpO2 99 %.   Systems examined below as of 12/30/16 General: NAD A&O x3 mood, affect normal  HEENT: Pupils equal, extraocular movements intact no nystagmus Respiratory: not short of breath at rest or with speaking Cardiovascular: No lower extremity edema, non tender Skin: Warm dry intact with no signs of infection or rash on extremities or on axial skeleton. Abdomen: Soft nontender, no masses Neuro: Cranial nerves  intact, neurovascularly intact in all extremities with 2+ DTRs and 2+ pulses. Lymph: No lymphadenopathy appreciated today  Gait normal with good balance and coordination.  MSK: Mild to moderate tender with full range of motion and good stability and symmetric strength and tone of shoulders, elbows, wrist,  knee hips and ankles bilaterally.    Back Exam:  Inspection: Mild loss in lordosis Motion: Flexion 45 deg, Extension 25 deg, Side Bending to 35 deg bilaterally,  Rotation to 45 deg bilaterally  SLR laying: Negative  XSLR laying: Negative  Palpable tenderness: Tender to palpation of  the paraspinal musculature lumbar spine. FABER: Mild tightness bilaterally. Sensory change: Gross sensation intact to all lumbar and sacral dermatomes.  Reflexes: 2+ at both patellar tendons, 2+ at achilles tendons, Babinski's downgoing.  Strength at foot  Plantar-flexion: 5/5 Dorsi-flexion: 5/5 Eversion: 5/5 Inversion: 5/5  Leg strength  Quad: 5/5 Hamstring: 5/5 Hip flexor: 5/5 Hip abductors: 5/5  Gait unremarkable.    Osteopathic findings  T3 extended rotated and side bent right inhaled third  rib T9 extended rotated and side bent left L3 flexed rotated and side bent right Sacrum right on right Pelvic shear noted  Impression and Recommendations:     This case required medical decision making of moderate complexity.      Note: This dictation was prepared with Dragon dictation along with smaller phrase technology. Any transcriptional errors that result from this process are unintentional.

## 2016-12-30 NOTE — Assessment & Plan Note (Signed)
Decision today to treat with OMT was based on Physical Exam  After verbal consent patient was treated with HVLA, ME, FPR techniques in  thoracic, lumbar and sacral and pelvis areas  Patient tolerated the procedure well with improvement in symptoms  Patient given exercises, stretches and lifestyle modifications  See medications in patient instructions if given  Patient will follow up in 4-6 weeks

## 2016-12-30 NOTE — Patient Instructions (Signed)
Good to see you  [name] is your friend.  Stay active.  I am happy we are making progress Cymbalta 40 mg  See me again in 4-6 weeks

## 2016-12-30 NOTE — Assessment & Plan Note (Signed)
Patient is doing better overall. We discussed icing regimen and home exercises. We discussed objective is to do a which ones to avoid. Patient will increase Cymbalta to 40 mg. Patient will do very well. Follow-up again in 6-8 weeks

## 2017-01-25 ENCOUNTER — Other Ambulatory Visit: Payer: Self-pay | Admitting: Family Medicine

## 2017-01-25 MED ORDER — TRAMADOL HCL 50 MG PO TABS: 50.0000 mg | ORAL_TABLET | Freq: Three times a day (TID) | ORAL | 0 refills | Status: DC | PRN

## 2017-01-25 MED FILL — DULoxetine HCL 20 MG CPEP: 20 | 30 days supply | Qty: 60 | Fill #1

## 2017-01-25 NOTE — Telephone Encounter (Signed)
Check [facility] [place] registry last filled 10/29/2016...[name]

## 2017-01-26 DIAGNOSIS — G8929 Other chronic pain: Secondary | ICD-10-CM | POA: Diagnosis not present

## 2017-01-26 DIAGNOSIS — M545 Low back pain: Secondary | ICD-10-CM | POA: Diagnosis not present

## 2017-02-08 ENCOUNTER — Encounter: Payer: Self-pay | Admitting: Family Medicine

## 2017-02-09 MED ORDER — TRAMADOL HCL 50 MG PO TABS: 50.0000 mg | ORAL_TABLET | Freq: Two times a day (BID) | ORAL | 0 refills | Status: DC | PRN

## 2017-02-09 MED FILL — traMADol HCL 50 MG TABS: 50 | 30 days supply | Qty: 60 | Fill #0

## 2017-02-15 NOTE — Progress Notes (Signed)
Subjective:    Patient ID: [name], female    DOB: [birth year], 41 y.o.   MRN: [id number]  HPI She is here for an acute visit.   She thinks she needs to see rheumatology.  She has been seeing Dr Tamala Julian.  Her first symptoms started around October 2017.  It started with joint pain in wrists, fingers and her knees get red and feel warm to touch.  In February she had a back injury and had significant muscle spasms and this cause increased symptoms.  She did have back issues prior to this injury.  She has intermittent swollen lymph nodes.  This occurs without cold or allergy symptoms.  She can feel them in her neck, supraclavicular areas.    She feels run down.  She has increased her sleep and it has not helped.  She feel exhausted that does not make sense.  She feels achy - muscle aches without cold symptoms.    She had a flare that started around thanksgiving.  It started with her hand and wrist pain.  She has weakness in her right hand and was not able to cut up sweet potatoes. Her hand became swollen, red, hot and tender.   She goes through periods of night sweats.  She has joint stiffness in the morning - a hot shower helps.  The stiffness can last a couple of hours.  Her eyes feel irritated, dry and grainy.    Medications and allergies reviewed with patient and updated if appropriate.  Patient Active Problem List   Diagnosis Date Noted  . Polyarthralgia 02/26/2016  . GERD (gastroesophageal reflux disease) 02/17/2016  . Night sweats 01/21/2016  . Cervical lymphadenopathy 01/21/2016  . Post-nasal drip 01/21/2016  . Quadriceps muscle strain 10/14/2015  . Cough 09/14/2015  . Fullness of neck 09/14/2015  . Nonallopathic lesion of thoracic region 08/23/2015  . Lumbar radiculopathy 08/02/2015  . 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 Scapular dysfunction 03/20/2015  . 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  Nonallopathic lesion of lumbosacral region 06/29/2014  . Nonallopathic lesion of sacral region 06/29/2014  . Nonallopathic lesion of pelvic region 06/29/2014  . Recurrent UTI 05/18/2013  . Back pain, acute 10/07/2010  . Allergic rhinitis 08/14/2010  . Essential hypertension, benign 01/22/2010    Current Outpatient Medications on File Prior to Visit  Medication Sig Dispense Refill  . acetaminophen (TYLENOL) 650 MG CR tablet Take 650 mg by mouth every 8 (eight) hours as needed for pain.    Marland Kitchen amLODipine (NORVASC) 5 MG tablet Take 1 tablet (5 mg total) by mouth daily. 90 tablet 1  . Ascorbic Acid (VITAMIN C PO) Take by mouth daily.    . Cholecalciferol (VITAMIN D3) 2000 units TABS Take 2,000 Units by mouth daily.    . DULoxetine 40 MG CPEP Take 40 mg by mouth daily. 30 capsule 3  . Ferrous Sulfate (IRON) 325 (65 Fe) MG TABS Take by mouth daily.    Marland Kitchen Fexofenadine HCl (ALLEGRA PO) Take by mouth.    . gabapentin (NEURONTIN) 100 MG capsule TAKE 2 CAPSULES BY MOUTH AT BEDTIME. 180 capsule 1  . hydrochlorothiazide (HYDRODIURIL) 25 MG tablet Take 1 tablet (25 mg total) by mouth daily. 90 tablet 3  . nitrofurantoin (MACRODANTIN) 50 MG capsule TAKE AFTER INTERCOURSE 45 capsule 0  . ranitidine (ZANTAC) 150 MG tablet Take 1 tablet (150 mg total) by mouth 2 (two) times daily. 180 tablet 3  .  traMADol (ULTRAM) 50 MG tablet Take 1 tablet (50 mg total) by mouth every 12 (twelve) hours as needed. 60 tablet 0  . TURMERIC PO Take by mouth daily.     No current facility-administered medications on file prior to visit.     Past Medical History:  Diagnosis Date  . Allergy   . Hypertension     Past Surgical History:  Procedure Laterality Date  . CHOLECYSTECTOMY    . TUBAL LIGATION      Social History   Socioeconomic History  . Marital status: Married    Spouse name: Not on file  . Number of children: Not on file  . Years of education: Not on file  . Highest education level: Not on file  Social  Needs  . Financial resource strain: Not on file  . Food insecurity - worry: Not on file  . Food insecurity - inability: Not on file  . Transportation needs - medical: Not on file  . Transportation needs - non-medical: Not on file  Occupational History  . Not on file  Tobacco Use  . Smoking status: Never Smoker  . Smokeless tobacco: Never Used  Substance and Sexual Activity  . Alcohol use: Yes  . Drug use: No  . Sexual activity: Yes  Other Topics Concern  . 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 Cancer Mother        Breast Cancer  . Breast cancer Mother   . Hypertension Father   . Hyperlipidemia Father   . Other Brother        lyme  . Stroke Maternal Grandmother   . Alcohol abuse Paternal Grandmother   . Heart disease Paternal Grandfather     Review of Systems  Constitutional: Positive for diaphoresis and fatigue.  Eyes:       Eyes feel dry and irritated, feel grainy  Respiratory: Negative for cough, shortness of breath and wheezing.   Cardiovascular: Negative for chest pain and palpitations.  Gastrointestinal: Positive for constipation. Negative for abdominal pain, blood in stool, diarrhea and nausea.  Musculoskeletal: Positive for arthralgias, back pain, joint swelling, myalgias and neck pain.       Joint stiffness  Skin: Positive for color change (red area on upper chest - patchy at times).  Neurological: Positive for headaches.       Objective:   Vitals:   02/18/17 1043  BP: 124/70  Pulse: (!) 106  Resp: 16  Temp: 98.6 F (37 C)  SpO2: 97%   Wt Readings from Last 3 Encounters:  02/18/17 139 lb (63 kg)  12/30/16 135 lb (61.2 kg)  11/30/16 137 lb (62.1 kg)   Body mass index is 24.62 kg/m.   Physical Exam  Constitutional: She appears well-developed and well-nourished. No distress.  HENT:  Head: Normocephalic and atraumatic.  Eyes: Conjunctivae are  normal.  Neck: Neck supple. No tracheal deviation present. No thyromegaly present.  Cardiovascular: Normal rate, regular rhythm and normal heart sounds.  Pulmonary/Chest: Effort normal and breath sounds normal. No respiratory distress. She has no wheezes. She has no rales.  Musculoskeletal: Normal range of motion. She exhibits no edema, tenderness or deformity.  Lymphadenopathy:    She has no cervical adenopathy.  Skin: Skin is warm and dry. She is not diaphoretic. There is erythema (upper chest ).  Psychiatric: She has a normal mood and affect.  Her behavior is normal.          Assessment & Plan:    See Problem List for Assessment and Plan of chronic medical problems.

## 2017-02-18 ENCOUNTER — Ambulatory Visit (INDEPENDENT_AMBULATORY_CARE_PROVIDER_SITE_OTHER): Payer: 59 | Admitting: Internal Medicine

## 2017-02-18 ENCOUNTER — Encounter: Payer: Self-pay | Admitting: Internal Medicine

## 2017-02-18 VITALS — BP 124/70 | HR 106 | Temp 98.6°F | Resp 16 | Wt 139.0 lb

## 2017-02-18 DIAGNOSIS — M2550 Pain in unspecified joint: Secondary | ICD-10-CM

## 2017-02-18 NOTE — Patient Instructions (Addendum)
  Medications reviewed and updated.  No changes recommended at this time.   A referral was ordered for rheumatology

## 2017-02-18 NOTE — Assessment & Plan Note (Signed)
Experiencing flares of joint pain, joint swelling, joint stiffness along with several other concerning symptoms for a possible autoimmune disease. Preliminary autoimmune blood work negative Agree with need for rheumatological evaluation-referred today We will hold off on any additional blood work until she sees rheumatology

## 2017-02-23 NOTE — Progress Notes (Signed)
[name] [address] [place], [place] [address] Phone: [phone] Subjective:     CC: Neck and back pain follow-up  [id number]  [name] is a 41 y.o. female coming in with complaint of neck and back pain.  Has been seen multiple times.  We have attempted osteopathic manipulation with mixed results.  Patient is also had signs and symptoms consistent with more of a polymyalgia but workup for any type of autoimmune had been unremarkable.  Worsening symptoms.  Patient states that the pain is unrelenting.  Is on 40 mg of Cymbalta and feels like she has plateaued at this dose.  Patient is taking tramadol most days of the week.  Patient has been referred to rheumatology for further evaluation and workup.  Patient is concerned because the pain is affecting daily activities.      Past Medical History:  Diagnosis Date  . Allergy   . Hypertension    Past Surgical History:  Procedure Laterality Date  . CHOLECYSTECTOMY    . TUBAL LIGATION     Social History   Socioeconomic History  . Marital status: Married    Spouse name: None  . Number of children: None  . Years of education: None  . Highest education level: None  Social Needs  . Financial resource strain: None  . Food insecurity - worry: None  . Food insecurity - inability: None  . Transportation needs - medical: None  . Transportation needs - non-medical: None  Occupational History  . None  Tobacco Use  . Smoking status: Never Smoker  . Smokeless tobacco: Never Used  Substance and Sexual Activity  . Alcohol use: Yes  . Drug use: No  . Sexual activity: Yes  Other Topics Concern  . 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 Lamisil [Terbinafine] Hives and Itching  . Terbinafine Hcl Hives   Family History  Problem Relation Age of Onset  . Cancer Mother        Breast Cancer  . Breast cancer Mother   .  Hypertension Father   . Hyperlipidemia Father   . Other Brother        lyme  . Stroke Maternal Grandmother   . Alcohol abuse Paternal Grandmother   . Heart disease Paternal Grandfather      Past medical history, social, surgical and family history all reviewed in electronic medical record.  No pertanent information unless stated regarding to the chief complaint.   Review of Systems:Review of systems updated and as accurate as of 02/24/17  No , visual changes, nausea, vomiting, diarrhea, constipation, dizziness, abdominal pain, skin rash, fevers, chills, night sweats, weight loss, swollen lymph nodes,chest pain, shortness of breath, mood changes.  Positive muscle aches, body aches, headaches, intermittent joint swelling  Objective  Blood pressure 120/82, pulse 86, height 5' 3"  (1.6 m), weight 138 lb (62.6 kg), SpO2 99 %. Systems examined below as of 02/24/17   General: No apparent distress alert and oriented x3 mood and affect normal, dressed appropriately.  HEENT: Pupils equal, extraocular movements intact  Respiratory: Patient's speak in full sentences and does not appear short of breath  Cardiovascular: No lower extremity edema, non tender, no erythema  Skin: Warm dry intact with no signs of infection or rash on extremities or on axial skeleton.  Patient does have signs in the hands of raynauds syndrome with demarcation Abdomen:  Soft nontender  Neuro: Cranial nerves II through XII are intact, neurovascularly intact in all extremities with 2+ DTRs and 2+ pulses.  Lymph: No lymphadenopathy of posterior or anterior cervical chain or axillae bilaterally.  Gait normal with good balance and coordination.  MSK: diffuse tender with full range of motion and good stability and symmetric strength and tone of shoulders, elbows, wrist, hip, knee and ankles bilaterally.  Back exam shows significant tightness in the thoracolumbar juncture patient has some pain over the bilateral sacroiliac joints as  well.  Positive Faber test.  Increasing discomfort in the piriformis right greater than left.  Negative straight leg test bilaterally.  Osteopathic findings T11 extended rotated and side bent left L2 flexed rotated and side bent right Sacrum right on right Right anterior ilium    Impression and Recommendations:     This case required medical decision making of moderate complexity.      Note: This dictation was prepared with Dragon dictation along with smaller phrase technology. Any transcriptional errors that result from this process are unintentional.

## 2017-02-24 ENCOUNTER — Encounter: Payer: Self-pay | Admitting: Family Medicine

## 2017-02-24 ENCOUNTER — Ambulatory Visit (INDEPENDENT_AMBULATORY_CARE_PROVIDER_SITE_OTHER): Payer: 59 | Admitting: Family Medicine

## 2017-02-24 VITALS — BP 120/82 | HR 86 | Ht 63.0 in | Wt 138.0 lb

## 2017-02-24 DIAGNOSIS — M533 Sacrococcygeal disorders, not elsewhere classified: Secondary | ICD-10-CM | POA: Diagnosis not present

## 2017-02-24 DIAGNOSIS — M999 Biomechanical lesion, unspecified: Secondary | ICD-10-CM | POA: Diagnosis not present

## 2017-02-24 DIAGNOSIS — M2550 Pain in unspecified joint: Secondary | ICD-10-CM | POA: Diagnosis not present

## 2017-02-24 MED ORDER — DULOXETINE HCL 60 MG PO CPEP: 60.0000 mg | ORAL_CAPSULE | Freq: Every day | ORAL | 3 refills | Status: DC

## 2017-02-24 MED FILL — DULoxetine HCL 60 MG CPEP: 60 | 30 days supply | Qty: 30 | Fill #0

## 2017-02-24 NOTE — Assessment & Plan Note (Signed)
Decision today to treat with OMT was based on Physical Exam  After verbal consent patient was treated with HVLA, ME, FPR techniques in , thoracic, lumbar and sacral and pelvis areas  Patient tolerated the procedure well with improvement in symptoms  Patient given exercises, stretches and lifestyle modifications  See medications in patient instructions if given  Patient will follow up in 4-6 weeks

## 2017-02-24 NOTE — Patient Instructions (Signed)
increase cymbalta to 60 and hopefully we will gett a little better Will let Dr. Quay Burow do the tramadol  Stay active and like the stretches.  Dr. Amil Amen would be good to see if he can help  See me again in 4-6 weeks.

## 2017-02-24 NOTE — Assessment & Plan Note (Signed)
Workup has been unremarkable.  Has been doing fairly well with the Cymbalta and will increase to 60 mg.  Continue the vitamin D.  Patient will get tramadol from primary care provider.  Sent to rheumatology for further evaluation already.

## 2017-02-24 NOTE — Assessment & Plan Note (Signed)
Continues to have significant amount of discomfort.  We did inject her greater than a year ago and we discussed possibly repeating this which patient declined.  Patient has had workup in the back previously.  We discussed icing regimen, home exercise.  Patient is adamant that she feels most of her pain is secondary to an autoimmune disease that she just has not been diagnosed.  Increase Cymbalta for the polymyalgia.  Follow-up again in 4-6 weeks

## 2017-03-12 ENCOUNTER — Other Ambulatory Visit: Payer: Self-pay | Admitting: Internal Medicine

## 2017-03-12 ENCOUNTER — Other Ambulatory Visit: Payer: Self-pay | Admitting: Family Medicine

## 2017-03-12 MED FILL — AMLODIPINE BESYLATE 5 MG TA: 5 | 90 days supply | Qty: 90 | Fill #0

## 2017-03-12 MED FILL — HYDROCHLOROTHIAZIDE 25 MG T: 25 | 90 days supply | Qty: 90 | Fill #0

## 2017-03-15 MED FILL — GABAPENTIN 100 MG CAP: 100 | 90 days supply | Qty: 180 | Fill #0

## 2017-03-15 NOTE — Telephone Encounter (Signed)
Refill done.  

## 2017-03-22 MED FILL — DULoxetine HCL 60 MG CPEP: 60 | 30 days supply | Qty: 30 | Fill #1

## 2017-03-29 DIAGNOSIS — M545 Low back pain: Secondary | ICD-10-CM | POA: Diagnosis not present

## 2017-03-31 NOTE — Progress Notes (Signed)
[name] [place], [place] [address] Phone: [phone] Subjective:   CC: back pain   [id number]  [name] is a 42 y.o. female coming in with complaint of back pain. On cymbalta for chronic back pain. Increased to 60 mg Patient states it is making some difference.  Patient has noticed not as much pain.  Patient denies any numbness or tingling.  Patient did given injection. Given epidural by another provider.  Did help.  Still SI joint. Pain.  Responds to OMT.       Past Medical History:  Diagnosis Date  . Allergy   . Hypertension    Past Surgical History:  Procedure Laterality Date  . CHOLECYSTECTOMY    . TUBAL LIGATION     Social History   Socioeconomic History  . Marital status: Married    Spouse name: Not on file  . Number of children: Not on file  . Years of education: Not on file  . Highest education level: Not on file  Social Needs  . Financial resource strain: Not on file  . Food insecurity - worry: Not on file  . Food insecurity - inability: Not on file  . Transportation needs - medical: Not on file  . Transportation needs - non-medical: Not on file  Occupational History  . Not on file  Tobacco Use  . Smoking status: Never Smoker  . Smokeless tobacco: Never Used  Substance and Sexual Activity  . Alcohol use: Yes  . Drug use: No  . Sexual activity: Yes  Other Topics Concern  . 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 Lamisil [Terbinafine] Hives and Itching  . Terbinafine Hcl Hives   Family History  Problem Relation Age of Onset  . Cancer Mother        Breast Cancer  . Breast cancer Mother   . Hypertension Father   . Hyperlipidemia Father   . Other Brother        lyme  . Stroke Maternal Grandmother   . Alcohol abuse Paternal Grandmother   . Heart disease Paternal Grandfather      Past medical history,  social, surgical and family history all reviewed in electronic medical record.  No pertanent information unless stated regarding to the chief complaint.   Review of Systems:Review of systems updated and as accurate as of 03/31/17  No headache, visual changes, nausea, vomiting, diarrhea, constipation, dizziness, abdominal pain, skin rash, fevers, chills, night sweats, weight loss, swollen lymph nodes, body aches, joint swelling, muscle aches, chest pain, shortness of breath, mood changes.   Objective  There were no vitals taken for this visit. Systems examined below as of 03/31/17   General: No apparent distress alert and oriented x3 mood and affect normal, dressed appropriately.  HEENT: Pupils equal, extraocular movements intact  Respiratory: Patient's speak in full sentences and does not appear short of breath  Cardiovascular: No lower extremity edema, non tender, no erythema  Skin: Warm dry intact with no signs of infection or rash on extremities or on axial skeleton.  Abdomen: Soft nontender  Neuro: Cranial nerves II through XII are intact, neurovascularly intact in all extremities with 2+ DTRs and 2+ pulses.  Lymph: No lymphadenopathy of posterior or anterior cervical chain or axillae bilaterally.  Gait normal with good balance and coordination.  MSK:  Non tender with full range of  motion and good stability and symmetric strength and tone of shoulders, elbows, wrist, hip, knee and ankles bilaterally.  Back Exam:  Inspection: Mild loss of lordosis Motion: Flexion 45 deg, Extension 25 deg, Side Bending to 35 deg bilaterally,  Rotation to 30 deg bilaterally  SLR laying: Negative  XSLR laying: Negative  Palpable tenderness: Tenderness over the sacroiliac joint. FABER: Positive right. Sensory change: Gross sensation intact to all lumbar and sacral dermatomes.  Reflexes: 2+ at both patellar tendons, 2+ at achilles tendons, Babinski's downgoing.  Strength at foot  Plantar-flexion: 5/5  Dorsi-flexion: 5/5 Eversion: 5/5 Inversion: 5/5  Leg strength  Quad: 5/5 Hamstring: 5/5 Hip flexor: 5/5 Hip abductors: 5/5  Gait unremarkable.  Osteopathic findings  T3 extended rotated and side bent right inhaled third rib T9 extended rotated and side bent left L2 flexed rotated and side bent right Sacrum right on right     Impression and Recommendations:     This case required medical decision making of moderate complexity.      Note: This dictation was prepared with Dragon dictation along with smaller phrase technology. Any transcriptional errors that result from this process are unintentional.

## 2017-04-01 ENCOUNTER — Ambulatory Visit (INDEPENDENT_AMBULATORY_CARE_PROVIDER_SITE_OTHER): Payer: 59 | Admitting: Family Medicine

## 2017-04-01 ENCOUNTER — Encounter: Payer: Self-pay | Admitting: Family Medicine

## 2017-04-01 VITALS — BP 124/84 | HR 78 | Ht 63.0 in | Wt 138.0 lb

## 2017-04-01 DIAGNOSIS — M999 Biomechanical lesion, unspecified: Secondary | ICD-10-CM

## 2017-04-01 DIAGNOSIS — M533 Sacrococcygeal disorders, not elsewhere classified: Secondary | ICD-10-CM | POA: Diagnosis not present

## 2017-04-01 NOTE — Assessment & Plan Note (Signed)
Patient responded very well to the previously and we may need to consider again.  Patient did do fairly well with the last epidural.  Patient will continue to be active.  Follow-up with me again in 4-8 weeks

## 2017-04-01 NOTE — Patient Instructions (Signed)
Good to see you  Overall not bad Keep it up  See em again in 2 weeks if we need the SI injection  Otherwise see me again in 6 week s

## 2017-04-01 NOTE — Assessment & Plan Note (Addendum)
Decision today to treat with OMT was based on Physical Exam  After verbal consent patient was treated with HVLA, ME, FPR techniques in thoracic, lumbar and sacral and pelvis areas  Patient tolerated the procedure well with improvement in symptoms  Patient given exercises, stretches and lifestyle modifications  See medications in patient instructions if given  Patient will follow up in 4-8 weeks

## 2017-04-05 DIAGNOSIS — H5203 Hypermetropia, bilateral: Secondary | ICD-10-CM | POA: Diagnosis not present

## 2017-04-16 ENCOUNTER — Ambulatory Visit: Payer: 59 | Admitting: Family Medicine

## 2017-04-18 NOTE — Progress Notes (Signed)
Procedure Note   Patient was fitted for a : standard, cushioned, semi-rigid orthotic. The orthotic was heated and afterward the patient patient seated position and molded The patient was positioned in subtalar neutral position and 10 degrees of ankle dorsiflexion in a weight bearing stance. After completion of molding, patient did have orthotic management The blank was ground to a stable position for weight bearing. Size:9 pending Status post Base: Carbon fiber Additional Posting and Padding:  L & R medial 250/100 x2, lateral heel R 250/35, Transverse R 250/35 The patient ambulated these, and they were very comfortable.

## 2017-04-19 ENCOUNTER — Ambulatory Visit: Payer: 59 | Admitting: Family Medicine

## 2017-04-19 ENCOUNTER — Encounter: Payer: Self-pay | Admitting: Family Medicine

## 2017-04-19 DIAGNOSIS — R2689 Other abnormalities of gait and mobility: Secondary | ICD-10-CM | POA: Insufficient documentation

## 2017-04-19 NOTE — Assessment & Plan Note (Signed)
Antalgic gait noted.  Discussed with patient about icing regimen and home exercises.  Discussed which activities to do which wants to avoid.  Patient was to increase activity slowly over the course of next several days.  Patient denies any significant radiation down the legs or any numbness or tingling.  Patient will come back and see me again in 4 weeks

## 2017-04-27 MED FILL — DULoxetine HCL 60 MG CPEP: 60 | 30 days supply | Qty: 30 | Fill #2

## 2017-04-28 DIAGNOSIS — Z6825 Body mass index (BMI) 25.0-25.9, adult: Secondary | ICD-10-CM | POA: Diagnosis not present

## 2017-04-28 DIAGNOSIS — D8989 Other specified disorders involving the immune mechanism, not elsewhere classified: Secondary | ICD-10-CM | POA: Diagnosis not present

## 2017-04-28 DIAGNOSIS — E663 Overweight: Secondary | ICD-10-CM | POA: Diagnosis not present

## 2017-04-28 DIAGNOSIS — M2550 Pain in unspecified joint: Secondary | ICD-10-CM | POA: Diagnosis not present

## 2017-04-28 DIAGNOSIS — I10 Essential (primary) hypertension: Secondary | ICD-10-CM | POA: Diagnosis not present

## 2017-04-28 DIAGNOSIS — M545 Low back pain: Secondary | ICD-10-CM | POA: Diagnosis not present

## 2017-05-04 MED FILL — predniSONE 5 MG TABS: 5 | 12 days supply | Qty: 48 | Fill #0

## 2017-05-09 NOTE — Progress Notes (Signed)
[name] [place], [place] [address] Phone: [phone] Subjective:    I'm seeing this patient by the request  of:    CC: Back pain follow-up  [id number]  [name] is a 42 y.o. female coming in with complaint of back pain.  Patient is on Cymbalta at 60 mg.  Patient states that it seems to patient is following up with rheumatology.  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  Patient has been trying to be more active but finds it difficult secondary to discomfort and pain.       Past Medical History:  Diagnosis Date  . Allergy   . Hypertension    Past Surgical History:  Procedure Laterality Date  . CHOLECYSTECTOMY    . TUBAL LIGATION     Social History   Socioeconomic History  . Marital status: Married    Spouse name: None  . Number of children: None  . Years of education: None  . Highest education level: None  Social Needs  . Financial resource strain: None  . Food insecurity - worry: None  . Food insecurity - inability: None  . Transportation needs - medical: None  . Transportation needs - non-medical: None  Occupational History  . None  Tobacco Use  . Smoking status: Never Smoker  . Smokeless tobacco: Never Used  Substance and Sexual Activity  . Alcohol use: Yes  . Drug use: No  . Sexual activity: Yes  Other Topics Concern  . 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 Lamisil [Terbinafine] Hives and Itching  . Terbinafine Hcl Hives   Family History  Problem Relation Age of Onset  . Cancer Mother        Breast Cancer  . Breast cancer Mother   . Hypertension Father   . Hyperlipidemia Father   . Other Brother        lyme  . Stroke Maternal Grandmother   . Alcohol abuse Paternal Grandmother   . Heart disease Paternal Grandfather      Past medical history, social, surgical and family history  all reviewed in electronic medical record.  No pertanent information unless stated regarding to the chief complaint.   Review of Systems:Review of systems updated and as accurate as of 05/10/17  No headache, visual changes, nausea, vomiting, diarrhea, constipation, dizziness, abdominal pain, skin rash, fevers, chills, night sweats, weight loss, swollen lymph nodes,chest pain, shortness of breath, mood changes.  Positive body aches, muscle aches, joint swelling Objective  Blood pressure 118/78, pulse 87, weight 145 lb (65.8 kg), SpO2 98 %. Systems examined below as of 05/10/17   General: No apparent distress alert and oriented x3 mood and affect normal, dressed appropriately.  HEENT: Pupils equal, extraocular movements intact  Respiratory: Patient's speak in full sentences and does not appear short of breath  Cardiovascular: No lower extremity edema, non tender, no erythema  Skin: Warm dry intact with no signs of infection or rash on extremities or on axial skeleton.  Abdomen: Soft nontender  Neuro: Cranial nerves II through XII are intact, neurovascularly intact in all extremities with 2+ DTRs and 2+ pulses.  Lymph: No lymphadenopathy of posterior or anterior cervical chain or axillae bilaterally.  Gait normal with good balance and coordination.  MSK:  Non tender with full range of motion and good stability and symmetric strength and  tone of shoulders, elbows, wrist, hip, knee and ankles bilaterally.  Back Exam:  Inspection: Unremarkable  Motion: Flexion 45 deg, Extension 25 deg, Side Bending to 45 deg bilaterally,  Rotation to 45 deg bilaterally  SLR laying: Negative  XSLR laying: Negative  Palpable tenderness: Diffuse paraspinal musculature tenderness of the lumbar spine. FABER: Tightness bilaterally. Sensory change: Gross sensation intact to all lumbar and sacral dermatomes.  Reflexes: 2+ at both patellar tendons, 2+ at achilles tendons, Babinski's downgoing.  Strength at foot    Plantar-flexion: 5/5 Dorsi-flexion: 5/5 Eversion: 5/5 Inversion: 5/5  Leg strength  Quad: 5/5 Hamstring: 5/5 Hip flexor: 5/5 Hip abductors: 5/5  Gait unremarkable.   Osteopathic findings T9 extended rotated and side bent left inhaled rib L1 flexed rotated and side bent right Sacrum right on right Pelvic shear right   Impression and Recommendations:     This case required medical decision making of moderate complexity.      Note: This dictation was prepared with Dragon dictation along with smaller phrase technology. Any transcriptional errors that result from this process are unintentional.

## 2017-05-10 ENCOUNTER — Encounter: Payer: Self-pay | Admitting: Family Medicine

## 2017-05-10 ENCOUNTER — Ambulatory Visit: Payer: 59 | Admitting: Family Medicine

## 2017-05-10 VITALS — BP 118/78 | HR 87 | Wt 145.0 lb

## 2017-05-10 DIAGNOSIS — M533 Sacrococcygeal disorders, not elsewhere classified: Secondary | ICD-10-CM

## 2017-05-10 DIAGNOSIS — M999 Biomechanical lesion, unspecified: Secondary | ICD-10-CM

## 2017-05-10 NOTE — Assessment & Plan Note (Signed)
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  Has responded to injections previously.  Patient wants to avoid that at this time.  Encouraged her to continue to work on core strength and hip abductor strengthening.  Patient will continue to follow-up with rheumatology as well to see if there is any other underlying causes of some of her discomfort and pain.  Follow-up with me again in 4 weeks.

## 2017-05-10 NOTE — Assessment & Plan Note (Signed)
Decision today to treat with OMT was based on Physical Exam  After verbal consent patient was treated with HVLA, ME, FPR techniques in, lumbar and sacral pelvis areas  Patient tolerated the procedure well with improvement in symptoms  Patient given exercises, stretches and lifestyle modifications  See medications in patient instructions if given  Patient will follow up in 4-6 weeks

## 2017-05-10 NOTE — Patient Instructions (Signed)
Good to see you  Overall you are doing great  Stay active.  Keep up the medicines.  Write me after the prednisone tell me how it does.  See me again in 6ish weeks.

## 2017-05-28 MED FILL — DULoxetine HCL 60 MG CPEP: 60 | 30 days supply | Qty: 30 | Fill #3

## 2017-06-07 ENCOUNTER — Other Ambulatory Visit: Payer: Self-pay | Admitting: Internal Medicine

## 2017-06-07 DIAGNOSIS — M545 Low back pain: Secondary | ICD-10-CM | POA: Diagnosis not present

## 2017-06-07 MED FILL — HYDROCHLOROTHIAZIDE 25 MG T: 25 | 30 days supply | Qty: 30 | Fill #0

## 2017-06-14 DIAGNOSIS — M545 Low back pain: Secondary | ICD-10-CM | POA: Diagnosis not present

## 2017-06-17 DIAGNOSIS — Z1151 Encounter for screening for human papillomavirus (HPV): Secondary | ICD-10-CM | POA: Diagnosis not present

## 2017-06-17 DIAGNOSIS — Z6825 Body mass index (BMI) 25.0-25.9, adult: Secondary | ICD-10-CM | POA: Diagnosis not present

## 2017-06-17 DIAGNOSIS — Z01419 Encounter for gynecological examination (general) (routine) without abnormal findings: Secondary | ICD-10-CM | POA: Diagnosis not present

## 2017-06-17 DIAGNOSIS — R61 Generalized hyperhidrosis: Secondary | ICD-10-CM | POA: Diagnosis not present

## 2017-06-17 LAB — HM PAP SMEAR

## 2017-06-20 NOTE — Progress Notes (Signed)
[name] [place], [place] [address] Phone: [phone] Subjective:    CC: Back pain follow-up  [id number]  [name] is a 42 y.o. female coming in with complaint of back pain.  Patient has had some difficulty over the course of time.  Patient did have another epidural since we have seen patient.  Patient did not have any significant improvement and feels like she would not like to have any more of those at this time.  Patient was to start increasing activity slowly.  Is starting to go back to formal physical therapy as well as doing dry needling.  Feels like that is beneficial.  Wearing the custom orthotics on a more regular basis as well.     Past Medical History:  Diagnosis Date  . Allergy   . Hypertension    Past Surgical History:  Procedure Laterality Date  . CHOLECYSTECTOMY    . TUBAL LIGATION     Social History   Socioeconomic History  . Marital status: Married    Spouse name: Not on file  . Number of children: Not on file  . Years of education: Not on file  . Highest education level: Not on file  Occupational History  . Not on file  Social Needs  . Financial resource strain: Not on file  . Food insecurity:    Worry: Not on file    Inability: Not on file  . Transportation needs:    Medical: Not on file    Non-medical: Not on file  Tobacco Use  . Smoking status: Never Smoker  . Smokeless tobacco: Never Used  Substance and Sexual Activity  . Alcohol use: Yes  . Drug use: No  . Sexual activity: Yes  Lifestyle  . Physical activity:    Days per week: Not on file    Minutes per session: Not on file  . Stress: Not on file  Relationships  . Social connections:    Talks on phone: Not on file    Gets together: Not on file    Attends religious service: Not on file    Active member of club or organization: Not on file    Attends meetings of clubs or organizations: Not on file    Relationship status: Not on  file  Other Topics Concern  . 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 Lamisil [Terbinafine] Hives and Itching  . Terbinafine Hcl Hives   Family History  Problem Relation Age of Onset  . Cancer Mother        Breast Cancer  . Breast cancer Mother   . Hypertension Father   . Hyperlipidemia Father   . Other Brother        lyme  . Stroke Maternal Grandmother   . Alcohol abuse Paternal Grandmother   . Heart disease Paternal Grandfather      Past medical history, social, surgical and family history all reviewed in electronic medical record.  No pertanent information unless stated regarding to the chief complaint.   Review of Systems:Review of systems updated and as accurate as of 06/21/17  No headache, visual changes, nausea, vomiting, diarrhea, constipation, dizziness, abdominal pain, skin rash, fevers, chills, night sweats, weight loss, swollen lymph nodes, body aches, joint swelling, muscle aches, chest pain, shortness of breath, mood changes.   Objective  Blood pressure 140/84, pulse (!) 121,  height 5' 3"  (1.6 m), weight 143 lb (64.9 kg), SpO2 97 %. Systems examined below as of 06/21/17   General: No apparent distress alert and oriented x3 mood and affect normal, dressed appropriately.  HEENT: Pupils equal, extraocular movements intact  Respiratory: Patient's speak in full sentences and does not appear short of breath  Cardiovascular: No lower extremity edema, non tender, no erythema  Skin: Warm dry intact with no signs of infection or rash on extremities or on axial skeleton.  Abdomen: Soft nontender  Neuro: Cranial nerves II through XII are intact, neurovascularly intact in all extremities with 2+ DTRs and 2+ pulses.  Lymph: No lymphadenopathy of posterior or anterior cervical chain or axillae bilaterally.  Gait normal with good balance and coordination.  MSK: Mild tender with full range of  motion and good stability and symmetric strength and tone of shoulders, elbows, wrist, hip, knee and ankles bilaterally.  Patient back exam still shows significant tightness to palpation over the paraspinal musculature lumbar spine left greater than right. Osteopathic findings C2 flexed rotated and side bent right T3 extended rotated and side bent right inhaled third rib T6 extended rotated and side bent left L3 flexed rotated and side bent right Sacrum right on right      Impression and Recommendations:     This case required medical decision making of moderate complexity.      Note: This dictation was prepared with Dragon dictation along with smaller phrase technology. Any transcriptional errors that result from this process are unintentional.

## 2017-06-21 ENCOUNTER — Ambulatory Visit: Payer: 59 | Admitting: Family Medicine

## 2017-06-21 ENCOUNTER — Encounter: Payer: Self-pay | Admitting: Family Medicine

## 2017-06-21 VITALS — BP 140/84 | HR 121 | Ht 63.0 in | Wt 143.0 lb

## 2017-06-21 DIAGNOSIS — M999 Biomechanical lesion, unspecified: Secondary | ICD-10-CM

## 2017-06-21 DIAGNOSIS — M545 Low back pain: Secondary | ICD-10-CM | POA: Diagnosis not present

## 2017-06-21 DIAGNOSIS — M5416 Radiculopathy, lumbar region: Secondary | ICD-10-CM | POA: Diagnosis not present

## 2017-06-21 MED ORDER — DULOXETINE HCL 60 MG PO CPEP: 60.0000 mg | ORAL_CAPSULE | Freq: Every day | ORAL | 3 refills | Status: DC

## 2017-06-21 MED FILL — DULoxetine HCL 60 MG CPEP: 60 | 90 days supply | Qty: 90 | Fill #0

## 2017-06-21 NOTE — Assessment & Plan Note (Signed)
Decision today to treat with OMT was based on Physical Exam  After verbal consent patient was treated with HVLA, ME, FPR techniques in cervical, thoracic, lumbar and sacral areas  Patient tolerated the procedure well with improvement in symptoms  Patient given exercises, stretches and lifestyle modifications  See medications in patient instructions if given  Patient will follow up in 3 weeks

## 2017-06-21 NOTE — Patient Instructions (Signed)
Good to see you  I think you are doing well  I want you to keep with your plan.  Looks good.  Got a lot of good movement today  Stay active See me again in 6-7 weeks

## 2017-06-21 NOTE — Assessment & Plan Note (Signed)
Not having complete resolution of pain but is managing.  Discussed icing regimen. Discussed HEP Patient is doing relatively well.  Follow-up again in 6 weeks

## 2017-06-23 ENCOUNTER — Telehealth: Payer: Self-pay | Admitting: Internal Medicine

## 2017-06-23 MED ORDER — AMLODIPINE BESYLATE 5 MG PO TABS: 5.0000 mg | ORAL_TABLET | Freq: Every day | ORAL | 0 refills | Status: DC

## 2017-06-23 NOTE — Telephone Encounter (Signed)
Copied from [place] [phone]. Topic: Quick Communication - Rx Refill/Question >> Jun 23, 2017  2:02 PM Boyd Kerbs wrote: Medication: amLODipine (NORVASC) 5 MG tablet Pt. Is out of medication and has appt. 5/21  Has the patient contacted their pharmacy? No. (Agent: If no, request that the patient contact the pharmacy for the refill.) Preferred Pharmacy (with phone number or street name):   [facility], [place]. [address] [place] [address] Phone: [phone] Fax: [phone]   Agent: Please be advised that RX refills may take up to 3 business days. We ask that you follow-up with your pharmacy.

## 2017-06-24 MED FILL — AMLODIPINE BESYLATE 5 MG TA: 5 | 45 days supply | Qty: 45 | Fill #0

## 2017-06-30 DIAGNOSIS — M545 Low back pain: Secondary | ICD-10-CM | POA: Diagnosis not present

## 2017-07-06 DIAGNOSIS — M545 Low back pain: Secondary | ICD-10-CM | POA: Diagnosis not present

## 2017-07-07 ENCOUNTER — Encounter: Payer: Self-pay | Admitting: Family Medicine

## 2017-07-09 ENCOUNTER — Telehealth: Payer: Self-pay | Admitting: Family Medicine

## 2017-07-09 MED ORDER — METHYLPREDNISOLONE 4 MG PO TBPK: ORAL_TABLET | ORAL | 0 refills | Status: DC

## 2017-07-09 MED FILL — METHYLPREDNISOLONE 4 MG TAB: 4 | 18 days supply | Qty: 17 | Fill #0

## 2017-07-09 NOTE — Telephone Encounter (Signed)
Spoke with pharmacy to verify Medrol instructions.

## 2017-07-09 NOTE — Telephone Encounter (Signed)
Copied from [place] [phone]. Topic: Quick Communication - Rx Refill/Question >> Jul 09, 2017  9:28 AM Robina Ade, Helene Kelp D wrote: Medication:methylPREDNISolone (MEDROL) 4 MG TBPK tablet Has the patient contacted their pharmacy? Yes, pharmacy needs clarification on doze and how to take. (Agent: If no, request that the patient contact the pharmacy for the refill.) Preferred Pharmacy (with phone number or street name): [facility], [place]: Please be advised that RX refills may take up to 3 business days. We ask that you follow-up with your pharmacy.

## 2017-07-15 ENCOUNTER — Other Ambulatory Visit: Payer: Self-pay | Admitting: Internal Medicine

## 2017-07-15 DIAGNOSIS — M545 Low back pain: Secondary | ICD-10-CM | POA: Diagnosis not present

## 2017-07-16 MED FILL — HYDROCHLOROTHIAZIDE 25 MG T: 25 | 30 days supply | Qty: 30 | Fill #0

## 2017-07-22 DIAGNOSIS — M545 Low back pain: Secondary | ICD-10-CM | POA: Diagnosis not present

## 2017-07-26 NOTE — Patient Instructions (Addendum)
Test(s) ordered today. Your results will be released to [place] (or called to you) after review, usually within 72hours after test completion. If any changes need to be made, you will be notified at that same time.  All other Health Maintenance issues reviewed.   All recommended immunizations and age-appropriate screenings are up-to-date or discussed.  No immunizations administered today.   Medications reviewed and updated.  No changes recommended at this time.  Your prescription(s) have been submitted to your pharmacy. Please take as directed and contact our office if you believe you are having problem(s) with the medication(s).   Please followup in 6 months   Health Maintenance, Female Adopting a healthy lifestyle and getting preventive care can go a long way to promote health and wellness. Talk with your health care provider about what schedule of regular examinations is right for you. This is a good chance for you to check in with your provider about disease prevention and staying healthy. In between checkups, there are plenty of things you can do on your own. Experts have done a lot of research about which lifestyle changes and preventive measures are most likely to keep you healthy. Ask your health care provider for more information. Weight and diet Eat a healthy diet  Be sure to include plenty of vegetables, fruits, low-fat dairy products, and lean protein.  Do not eat a lot of foods high in solid fats, added sugars, or salt.  Get regular exercise. This is one of the most important things you can do for your health. ? Most adults should exercise for at least 150 minutes each week. The exercise should increase your heart rate and make you sweat (moderate-intensity exercise). ? Most adults should also do strengthening exercises at least twice a week. This is in addition to the moderate-intensity exercise.  Maintain a healthy weight  Body mass index (BMI) is a measurement that can  be used to identify possible weight problems. It estimates body fat based on height and weight. Your health care provider can help determine your BMI and help you achieve or maintain a healthy weight.  For females 13 years of age and older: ? A BMI below 18.5 is considered underweight. ? A BMI of 18.5 to 24.9 is normal. ? A BMI of 25 to 29.9 is considered overweight. ? A BMI of 30 and above is considered obese.  Watch levels of cholesterol and blood lipids  You should start having your blood tested for lipids and cholesterol at 44 years of age, then have this test every 5 years.  You may need to have your cholesterol levels checked more often if: ? Your lipid or cholesterol levels are high. ? You are older than 44 years of age. ? You are at high risk for heart disease.  Cancer screening Lung Cancer  Lung cancer screening is recommended for adults 54-15 years old who are at high risk for lung cancer because of a history of smoking.  A yearly low-dose CT scan of the lungs is recommended for people who: ? Currently smoke. ? Have quit within the past 15 years. ? Have at least a 30-pack-year history of smoking. A pack year is smoking an average of one pack of cigarettes a day for 1 year.  Yearly screening should continue until it has been 15 years since you quit.  Yearly screening should stop if you develop a health problem that would prevent you from having lung cancer treatment.  Breast Cancer  Practice breast self-awareness.  This means understanding how your breasts normally appear and feel.  It also means doing regular breast self-exams. Let your health care provider know about any changes, no matter how small.  If you are in your 20s or 30s, you should have a clinical breast exam (CBE) by a health care provider every 1-3 years as part of a regular health exam.  If you are 29 or older, have a CBE every year. Also consider having a breast X-ray (mammogram) every year.  If you  have a family history of breast cancer, talk to your health care provider about genetic screening.  If you are at high risk for breast cancer, talk to your health care provider about having an MRI and a mammogram every year.  Breast cancer gene (BRCA) assessment is recommended for women who have family members with BRCA-related cancers. BRCA-related cancers include: ? Breast. ? Ovarian. ? Tubal. ? Peritoneal cancers.  Results of the assessment will determine the need for genetic counseling and BRCA1 and BRCA2 testing.  Cervical Cancer Your health care provider may recommend that you be screened regularly for cancer of the pelvic organs (ovaries, uterus, and vagina). This screening involves a pelvic examination, including checking for microscopic changes to the surface of your cervix (Pap test). You may be encouraged to have this screening done every 3 years, beginning at age 40.  For women ages 44-65, health care providers may recommend pelvic exams and Pap testing every 3 years, or they may recommend the Pap and pelvic exam, combined with testing for human papilloma virus (HPV), every 5 years. Some types of HPV increase your risk of cervical cancer. Testing for HPV may also be done on women of any age with unclear Pap test results.  Other health care providers may not recommend any screening for nonpregnant women who are considered low risk for pelvic cancer and who do not have symptoms. Ask your health care provider if a screening pelvic exam is right for you.  If you have had past treatment for cervical cancer or a condition that could lead to cancer, you need Pap tests and screening for cancer for at least 20 years after your treatment. If Pap tests have been discontinued, your risk factors (such as having a new sexual partner) need to be reassessed to determine if screening should resume. Some women have medical problems that increase the chance of getting cervical cancer. In these cases,  your health care provider may recommend more frequent screening and Pap tests.  Colorectal Cancer  This type of cancer can be detected and often prevented.  Routine colorectal cancer screening usually begins at 44 years of age and continues through 44 years of age.  Your health care provider may recommend screening at an earlier age if you have risk factors for colon cancer.  Your health care provider may also recommend using home test kits to check for hidden blood in the stool.  A small camera at the end of a tube can be used to examine your colon directly (sigmoidoscopy or colonoscopy). This is done to check for the earliest forms of colorectal cancer.  Routine screening usually begins at age 96.  Direct examination of the colon should be repeated every 5-10 years through 44 years of age. However, you may need to be screened more often if early forms of precancerous polyps or small growths are found.  Skin Cancer  Check your skin from head to toe regularly.  Tell your health care provider about any  new moles or changes in moles, especially if there is a change in a mole's shape or color.  Also tell your health care provider if you have a mole that is larger than the size of a pencil eraser.  Always use sunscreen. Apply sunscreen liberally and repeatedly throughout the day.  Protect yourself by wearing long sleeves, pants, a wide-brimmed hat, and sunglasses whenever you are outside.  Heart disease, diabetes, and high blood pressure  High blood pressure causes heart disease and increases the risk of stroke. High blood pressure is more likely to develop in: ? People who have blood pressure in the high end of the normal range (130-139/85-89 mm Hg). ? People who are overweight or obese. ? People who are African American.  If you are 58-1 years of age, have your blood pressure checked every 3-5 years. If you are 52 years of age or older, have your blood pressure checked every year.  You should have your blood pressure measured twice-once when you are at a hospital or clinic, and once when you are not at a hospital or clinic. Record the average of the two measurements. To check your blood pressure when you are not at a hospital or clinic, you can use: ? An automated blood pressure machine at a pharmacy. ? A home blood pressure monitor.  If you are between 24 years and 65 years old, ask your health care provider if you should take aspirin to prevent strokes.  Have regular diabetes screenings. This involves taking a blood sample to check your fasting blood sugar level. ? If you are at a normal weight and have a low risk for diabetes, have this test once every three years after 44 years of age. ? If you are overweight and have a high risk for diabetes, consider being tested at a younger age or more often. Preventing infection Hepatitis B  If you have a higher risk for hepatitis B, you should be screened for this virus. You are considered at high risk for hepatitis B if: ? You were born in a country where hepatitis B is common. Ask your health care provider which countries are considered high risk. ? Your parents were born in a high-risk country, and you have not been immunized against hepatitis B (hepatitis B vaccine). ? You have HIV or AIDS. ? You use needles to inject street drugs. ? You live with someone who has hepatitis B. ? You have had sex with someone who has hepatitis B. ? You get hemodialysis treatment. ? You take certain medicines for conditions, including cancer, organ transplantation, and autoimmune conditions.  Hepatitis C  Blood testing is recommended for: ? Everyone born from [birth year] through 1967. ? Anyone with known risk factors for hepatitis C.  Sexually transmitted infections (STIs)  You should be screened for sexually transmitted infections (STIs) including gonorrhea and chlamydia if: ? You are sexually active and are younger than 44 years of  age. ? You are older than 44 years of age and your health care provider tells you that you are at risk for this type of infection. ? Your sexual activity has changed since you were last screened and you are at an increased risk for chlamydia or gonorrhea. Ask your health care provider if you are at risk.  If you do not have HIV, but are at risk, it may be recommended that you take a prescription medicine daily to prevent HIV infection. This is called pre-exposure prophylaxis (PrEP). You are considered at  risk if: ? You are sexually active and do not regularly use condoms or know the HIV status of your partner(s). ? You take drugs by injection. ? You are sexually active with a partner who has HIV.  Talk with your health care provider about whether you are at high risk of being infected with HIV. If you choose to begin PrEP, you should first be tested for HIV. You should then be tested every 3 months for as long as you are taking PrEP. Pregnancy  If you are premenopausal and you may become pregnant, ask your health care provider about preconception counseling.  If you may become pregnant, take 400 to 800 micrograms (mcg) of folic acid every day.  If you want to prevent pregnancy, talk to your health care provider about birth control (contraception). Osteoporosis and menopause  Osteoporosis is a disease in which the bones lose minerals and strength with aging. This can result in serious bone fractures. Your risk for osteoporosis can be identified using a bone density scan.  If you are 6 years of age or older, or if you are at risk for osteoporosis and fractures, ask your health care provider if you should be screened.  Ask your health care provider whether you should take a calcium or vitamin D supplement to lower your risk for osteoporosis.  Menopause may have certain physical symptoms and risks.  Hormone replacement therapy may reduce some of these symptoms and risks. Talk to your health  care provider about whether hormone replacement therapy is right for you. Follow these instructions at home:  Schedule regular health, dental, and eye exams.  Stay current with your immunizations.  Do not use any tobacco products including cigarettes, chewing tobacco, or electronic cigarettes.  If you are pregnant, do not drink alcohol.  If you are breastfeeding, limit how much and how often you drink alcohol.  Limit alcohol intake to no more than 1 drink per day for nonpregnant women. One drink equals 12 ounces of beer, 5 ounces of wine, or 1 ounces of hard liquor.  Do not use street drugs.  Do not share needles.  Ask your health care provider for help if you need support or information about quitting drugs.  Tell your health care provider if you often feel depressed.  Tell your health care provider if you have ever been abused or do not feel safe at home. This information is not intended to replace advice given to you by your health care provider. Make sure you discuss any questions you have with your health care provider. Document Released: 09/08/2010 Document Revised: 08/01/2015 Document Reviewed: 11/27/2014 Elsevier Interactive Patient Education  [employer].

## 2017-07-26 NOTE — Progress Notes (Signed)
Subjective:    Patient ID: [name], female    DOB: 01/09/1976, 42 y.o.   MRN: [id number]  HPI She is here for a physical exam.   She has intermittent flares of joint stiffness and soreness (fingers, wrist, elbows, knees), fatigue, chest rash gets brighter and a brain fog.  Over the past year she has had about 3 flares.  The steroid do help.  The cymbalta has been helpful.  She has seen Dr Amil Amen and there was no positive autoimmune tests so she will follow up with me as needed.  She continues to have chronic back pain.  She is doing PT.  She is taking tramadol only as needed.    Medications and allergies reviewed with patient and updated if appropriate.  Patient Active Problem List   Diagnosis Date Noted  . Antalgic gait 04/19/2017  . Polyarthralgia 02/26/2016  . GERD (gastroesophageal reflux disease) 02/17/2016  . Cervical lymphadenopathy 01/21/2016  . Quadriceps muscle strain 10/14/2015  . Cough 09/14/2015  . Fullness of neck 09/14/2015  . Nonallopathic lesion of thoracic region 08/23/2015  . Lumbar radiculopathy 08/02/2015  . 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 Scapular dysfunction 03/20/2015  . 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 Nonallopathic lesion of lumbosacral region 06/29/2014  . Nonallopathic lesion of sacral region 06/29/2014  . Nonallopathic lesion of pelvic region 06/29/2014  . Recurrent UTI 05/18/2013  . Back pain, acute 10/07/2010  . Allergic rhinitis 08/14/2010  . Essential hypertension, benign 01/22/2010    Current Outpatient Medications on File Prior to Visit  Medication Sig Dispense Refill  . acetaminophen (TYLENOL) 650 MG CR tablet Take 650 mg by mouth every 8 (eight) hours as needed for pain.    Marland Kitchen amLODipine (NORVASC) 5 MG tablet Take 1 tablet (5 mg total) by mouth daily. --- Office visit needed for further refills 45 tablet 0  . Ascorbic Acid (VITAMIN C PO) Take by mouth daily.     . Cholecalciferol (VITAMIN D3) 2000 units TABS Take 2,000 Units by mouth daily.    . DULoxetine (CYMBALTA) 60 MG capsule Take 1 capsule (60 mg total) by mouth daily. 90 capsule 3  . Ferrous Sulfate (IRON) 325 (65 Fe) MG TABS Take by mouth daily.    Marland Kitchen Fexofenadine HCl (ALLEGRA PO) Take by mouth.    . gabapentin (NEURONTIN) 100 MG capsule TAKE 2 CAPSULES BY MOUTH AT BEDTIME. 180 capsule 1  . hydrochlorothiazide (HYDRODIURIL) 25 MG tablet Take 1 tablet (25 mg total) by mouth daily. 30 tablet 0  . nitrofurantoin (MACRODANTIN) 50 MG capsule TAKE AFTER INTERCOURSE 45 capsule 0  . ranitidine (ZANTAC) 150 MG tablet Take 1 tablet (150 mg total) by mouth 2 (two) times daily. 180 tablet 3  . traMADol (ULTRAM) 50 MG tablet Take 1 tablet (50 mg total) by mouth every 12 (twelve) hours as needed. 60 tablet 0  . TURMERIC PO Take by mouth daily.     No current facility-administered medications on file prior to visit.     Past Medical History:  Diagnosis Date  . Allergy   . Hypertension     Past Surgical History:  Procedure Laterality Date  . CHOLECYSTECTOMY    . TUBAL LIGATION      Social History   Socioeconomic History  . Marital status: Married    Spouse name: Not on file  . Number of children: Not on file  . Years of education: Not on file  .  Highest education level: Not on file  Occupational History  . Not on file  Social Needs  . Financial resource strain: Not on file  . Food insecurity:    Worry: Not on file    Inability: Not on file  . Transportation needs:    Medical: Not on file    Non-medical: Not on file  Tobacco Use  . Smoking status: Never Smoker  . Smokeless tobacco: Never Used  Substance and Sexual Activity  . Alcohol use: Yes  . Drug use: No  . Sexual activity: Yes  Lifestyle  . Physical activity:    Days per week: Not on file    Minutes per session: Not on file  . Stress: Not on file  Relationships  . Social connections:    Talks on phone: Not on file     Gets together: Not on file    Attends religious service: Not on file    Active member of club or organization: Not on file    Attends meetings of clubs or organizations: Not on file    Relationship status: Not on file  Other Topics Concern  . 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 Cancer Mother        Breast Cancer  . Breast cancer Mother   . Hypertension Father   . Hyperlipidemia Father   . Other Brother        lyme  . Stroke Maternal Grandmother   . Alcohol abuse Paternal Grandmother   . Heart disease Paternal Grandfather     Review of Systems  Constitutional: Negative for chills and fever.  Eyes: Negative for visual disturbance.  Respiratory: Negative for cough, shortness of breath and wheezing.   Cardiovascular: Positive for leg swelling (mild - related to norvasc). Negative for chest pain and palpitations.  Gastrointestinal: Positive for constipation. Negative for abdominal pain, blood in stool, diarrhea and nausea.       Occ gerd  Genitourinary: Negative for dysuria and hematuria.  Musculoskeletal: Positive for back pain (chronic back pain).  Skin: Positive for rash (chest rash - flares up iwth flares of jiont pain).  Neurological: Positive for headaches (occ migraines). Negative for light-headedness.  Psychiatric/Behavioral: Negative for dysphoric mood. The patient is not nervous/anxious.        Objective:   Vitals:   07/27/17 0830  BP: 118/82  Pulse: 81  Resp: 16  Temp: 98.1 F (36.7 C)  SpO2: 98%   Filed Weights   07/27/17 0830  Weight: 141 lb (64 kg)   Body mass index is 24.98 kg/m.  Wt Readings from Last 3 Encounters:  07/27/17 141 lb (64 kg)  06/21/17 143 lb (64.9 kg)  05/10/17 145 lb (65.8 kg)     Physical Exam Constitutional: She appears well-developed and well-nourished. No distress.  HENT:  Head: Normocephalic and atraumatic.  Right  Ear: External ear normal. Normal ear canal and TM Left Ear: External ear normal.  Normal ear canal and TM Mouth/Throat: Oropharynx is clear and moist.  Eyes: Conjunctivae and EOM are normal.  Neck: Neck supple. No tracheal deviation present. No thyromegaly present.  No carotid bruit  Cardiovascular: Normal rate, regular rhythm and normal heart sounds.   No murmur heard.  No edema. Pulmonary/Chest: Effort normal and breath sounds normal. No respiratory distress. She has no wheezes. She has no rales.  Breast: deferred  to Gyn Abdominal: Soft. She exhibits no distension. There is no tenderness.  Lymphadenopathy: She has mild B/l  cervical adenopathy - chronic.  Skin: Skin is warm and dry. She is not diaphoretic.  Psychiatric: She has a normal mood and affect. Her behavior is normal.        Assessment & Plan:   Physical exam: Screening blood work  ordered Immunizations   Up to date  Mammogram    Up to date  Gyn - saw Dr Benjie Karvonen in April 2019 Eye exams    Up to date  Exercise - doing PT for back pain - no other exercise  Weight  Normal BMI Skin  No concerns Substance abuse   none  See Problem List for Assessment and Plan of chronic medical problems.   FU in one year

## 2017-07-27 ENCOUNTER — Other Ambulatory Visit (INDEPENDENT_AMBULATORY_CARE_PROVIDER_SITE_OTHER): Payer: 59

## 2017-07-27 ENCOUNTER — Ambulatory Visit (INDEPENDENT_AMBULATORY_CARE_PROVIDER_SITE_OTHER): Payer: 59 | Admitting: Internal Medicine

## 2017-07-27 ENCOUNTER — Encounter: Payer: Self-pay | Admitting: Internal Medicine

## 2017-07-27 VITALS — BP 118/82 | HR 81 | Temp 98.1°F | Resp 16 | Ht 63.0 in | Wt 141.0 lb

## 2017-07-27 DIAGNOSIS — R739 Hyperglycemia, unspecified: Secondary | ICD-10-CM | POA: Diagnosis not present

## 2017-07-27 DIAGNOSIS — N390 Urinary tract infection, site not specified: Secondary | ICD-10-CM

## 2017-07-27 DIAGNOSIS — R590 Localized enlarged lymph nodes: Secondary | ICD-10-CM

## 2017-07-27 DIAGNOSIS — Z0000 Encounter for general adult medical examination without abnormal findings: Secondary | ICD-10-CM | POA: Diagnosis not present

## 2017-07-27 DIAGNOSIS — K219 Gastro-esophageal reflux disease without esophagitis: Secondary | ICD-10-CM

## 2017-07-27 DIAGNOSIS — I10 Essential (primary) hypertension: Secondary | ICD-10-CM

## 2017-07-27 DIAGNOSIS — M545 Low back pain: Secondary | ICD-10-CM | POA: Diagnosis not present

## 2017-07-27 LAB — COMPREHENSIVE METABOLIC PANEL
ALK PHOS: 72 U/L (ref 39–117)
ALT: 9 U/L (ref 0–35)
AST: 10 U/L (ref 0–37)
Albumin: 4.5 g/dL (ref 3.5–5.2)
BILIRUBIN TOTAL: 0.7 mg/dL (ref 0.2–1.2)
BUN: 23 mg/dL (ref 6–23)
CO2: 31 mEq/L (ref 19–32)
Calcium: 9.8 mg/dL (ref 8.4–10.5)
Chloride: 99 mEq/L (ref 96–112)
Creatinine, Ser: 0.77 mg/dL (ref 0.40–1.20)
GFR: 86.5 mL/min (ref >60.00)
Glucose, Bld: 97 mg/dL (ref 70–99)
Potassium: 3.1 mEq/L — ABNORMAL LOW (ref 3.5–5.1)
Sodium: 140 mEq/L (ref 135–145)
TOTAL PROTEIN: 7.5 g/dL (ref 6.0–8.3)

## 2017-07-27 LAB — CBC WITH DIFFERENTIAL/PLATELET
BASOS PCT: 1.3 % (ref 0.0–3.0)
Basophils Absolute: 0.1 10*3/uL (ref 0.0–0.1)
EOS PCT: 3.3 % (ref 0.0–5.0)
Eosinophils Absolute: 0.4 10*3/uL (ref 0.0–0.7)
HCT: 41.8 % (ref 36.0–46.0)
Hemoglobin: 14.3 g/dL (ref 12.0–15.0)
LYMPHS ABS: 3.1 10*3/uL (ref 0.7–4.0)
Lymphocytes Relative: 29.1 % (ref 12.0–46.0)
MCHC: 34.2 g/dL (ref 30.0–36.0)
MCV: 92.2 fl (ref 78.0–100.0)
MONO ABS: 0.7 10*3/uL (ref 0.1–1.0)
Monocytes Relative: 6.5 % (ref 3.0–12.0)
NEUTROS ABS: 6.4 10*3/uL (ref 1.4–7.7)
NEUTROS PCT: 59.8 % (ref 43.0–77.0)
Platelets: 307 10*3/uL (ref 150.0–400.0)
RBC: 4.53 Mil/uL (ref 3.87–5.11)
RDW: 12.3 % (ref 11.5–15.5)
WBC: 10.7 10*3/uL — ABNORMAL HIGH (ref 4.0–10.5)

## 2017-07-27 LAB — HEMOGLOBIN A1C: HEMOGLOBIN A1C: 5.6 % (ref 4.6–6.5)

## 2017-07-27 LAB — LIPID PANEL
Cholesterol: 195 mg/dL (ref 0–200)
HDL: 60.6 mg/dL (ref >39.00)
LDL Cholesterol: 115 mg/dL — ABNORMAL HIGH (ref 0–99)
NONHDL: 134.82
TRIGLYCERIDES: 99 mg/dL (ref 0.0–149.0)
Total CHOL/HDL Ratio: 3
VLDL: 19.8 mg/dL (ref 0.0–40.0)

## 2017-07-27 LAB — TSH: TSH: 1.82 u[IU]/mL (ref 0.35–4.50)

## 2017-07-27 MED ORDER — AMLODIPINE BESYLATE 5 MG PO TABS: 5.0000 mg | ORAL_TABLET | Freq: Every day | ORAL | 3 refills | Status: DC

## 2017-07-27 MED ORDER — HYDROCHLOROTHIAZIDE 25 MG PO TABS: 25.0000 mg | ORAL_TABLET | Freq: Every day | ORAL | 3 refills | Status: DC

## 2017-07-27 NOTE — Assessment & Plan Note (Signed)
Chronic No evidence of an infection No further evaluation needed

## 2017-07-27 NOTE — Assessment & Plan Note (Signed)
Continue macrobid prn

## 2017-07-27 NOTE — Assessment & Plan Note (Signed)
BP well controlled Current regimen effective and well tolerated Continue current medications at current doses cmp  

## 2017-07-27 NOTE — Assessment & Plan Note (Signed)
Gets a cough Taking otc protonix for the past 4 weeks Advised to try to switch back to zantac - can take twice a day

## 2017-07-29 ENCOUNTER — Other Ambulatory Visit: Payer: Self-pay | Admitting: Internal Medicine

## 2017-07-29 ENCOUNTER — Encounter: Payer: Self-pay | Admitting: Internal Medicine

## 2017-07-29 DIAGNOSIS — E876 Hypokalemia: Secondary | ICD-10-CM

## 2017-08-04 ENCOUNTER — Other Ambulatory Visit: Payer: Self-pay | Admitting: Internal Medicine

## 2017-08-04 MED FILL — AMLODIPINE BESYLATE 5 MG TA: 5 | 90 days supply | Qty: 90 | Fill #0

## 2017-08-04 MED FILL — NITROFURANTOIN MCR 50 MG CA: 50 | 45 days supply | Qty: 45 | Fill #0

## 2017-08-05 ENCOUNTER — Encounter: Payer: Self-pay | Admitting: Internal Medicine

## 2017-08-05 DIAGNOSIS — M545 Low back pain: Secondary | ICD-10-CM | POA: Diagnosis not present

## 2017-08-05 MED FILL — GABAPENTIN 100 MG CAP: 100 | 90 days supply | Qty: 180 | Fill #1

## 2017-08-05 NOTE — Progress Notes (Signed)
[name] [address], [place] [address] Phone: [phone] Subjective:     CC: back pain   MBE:[id number]  [name] is a 42 y.o. female coming in with complaint of back pain. Patient has been remodeling her bathroom but hasn't had an increase in her pain. She did take a steroid dosepack a few weeks ago to help with her pain. Still pain overall. Has responded well to OMT      Past Medical History:  Diagnosis Date  . Allergy   . Hypertension    Past Surgical History:  Procedure Laterality Date  . CHOLECYSTECTOMY    . TUBAL LIGATION     Social History   Socioeconomic History  . Marital status: Married    Spouse name: Not on file  . Number of children: Not on file  . Years of education: Not on file  . Highest education level: Not on file  Occupational History  . Not on file  Social Needs  . Financial resource strain: Not on file  . Food insecurity:    Worry: Not on file    Inability: Not on file  . Transportation needs:    Medical: Not on file    Non-medical: Not on file  Tobacco Use  . Smoking status: Never Smoker  . Smokeless tobacco: Never Used  Substance and Sexual Activity  . Alcohol use: Yes  . Drug use: No  . Sexual activity: Yes  Lifestyle  . Physical activity:    Days per week: Not on file    Minutes per session: Not on file  . Stress: Not on file  Relationships  . Social connections:    Talks on phone: Not on file    Gets together: Not on file    Attends religious service: Not on file    Active member of club or organization: Not on file    Attends meetings of clubs or organizations: Not on file    Relationship status: Not on file  Other Topics Concern  . 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 Lamisil [Terbinafine] Hives and Itching  . Terbinafine Hcl Hives   Family History  Problem Relation Age of Onset  .  Cancer Mother        Breast Cancer  . Breast cancer Mother   . Hypertension Father   . Hyperlipidemia Father   . Other Brother        lyme  . Stroke Maternal Grandmother   . Alcohol abuse Paternal Grandmother   . Heart disease Paternal Grandfather      Past medical history, social, surgical and family history all reviewed in electronic medical record.  No pertanent information unless stated regarding to the chief complaint.   Review of Systems:Review of systems updated and as accurate as of 08/09/17  No headache, visual changes, nausea, vomiting, diarrhea, constipation, dizziness, abdominal pain, skin rash, fevers, chills, night sweats, weight loss, swollen lymph nodes, body aches, joint swelling, muscle aches, chest pain, shortness of breath, mood changes.   Objective  Blood pressure 98/64, pulse 90, height 5' 3"  (1.6 m), weight 143 lb (64.9 kg), SpO2 98 %. Systems examined below as of 08/09/17   General: No apparent distress alert and oriented x3 mood and affect normal, dressed appropriately.  HEENT: Pupils equal, extraocular movements intact  Respiratory: Patient's speak in full sentences and  does not appear short of breath  Cardiovascular: No lower extremity edema, non tender, no erythema  Skin: Warm dry intact with no signs of infection or rash on extremities or on axial skeleton.  Abdomen: Soft nontender  Neuro: Cranial nerves II through XII are intact, neurovascularly intact in all extremities with 2+ DTRs and 2+ pulses.  Lymph: No lymphadenopathy of posterior or anterior cervical chain or axillae bilaterally.  Gait normal with good balance and coordination.  MSK:  Non tender with full range of motion and good stability and symmetric strength and tone of shoulders, elbows, wrist, hip, knee and ankles bilaterally.  Back Exam:  Inspection: Loss of lordosis Motion: Flexion 30 deg, Extension 25 deg, Side Bending to 45 deg bilaterally,  Rotation to 45 deg bilaterally  SLR  laying: Negative  XSLR laying: Negative  Palpable tenderness: Diffusely tender to palpation in the thoracolumbar juncture as well as the lumbosacral junction.Marland Kitchen FABER: Tightness bilaterally. Sensory change: Gross sensation intact to all lumbar and sacral dermatomes.  Reflexes: 2+ at both patellar tendons, 2+ at achilles tendons, Babinski's downgoing.  Strength at foot  Plantar-flexion: 5/5 Dorsi-flexion: 5/5 Eversion: 5/5 Inversion: 5/5  Leg strength  Quad: 5/5 Hamstring: 5/5 Hip flexor: 5/5 Hip abductors: 4/5  Gait unremarkable.  Osteopathic findings  C2 flexed rotated and side bent left T3 extended rotated and side bent right inhaled third rib T9 extended rotated and side bent left L2 flexed rotated and side bent right Sacrum right on right     Impression and Recommendations:     This case required medical decision making of moderate complexity.      Note: This dictation was prepared with Dragon dictation along with smaller phrase technology. Any transcriptional errors that result from this process are unintentional.

## 2017-08-09 ENCOUNTER — Ambulatory Visit: Payer: 59 | Admitting: Family Medicine

## 2017-08-09 ENCOUNTER — Other Ambulatory Visit: Payer: Self-pay | Admitting: Obstetrics & Gynecology

## 2017-08-09 ENCOUNTER — Encounter: Payer: Self-pay | Admitting: Family Medicine

## 2017-08-09 ENCOUNTER — Other Ambulatory Visit (INDEPENDENT_AMBULATORY_CARE_PROVIDER_SITE_OTHER): Payer: 59

## 2017-08-09 VITALS — BP 98/64 | HR 90 | Ht 63.0 in | Wt 143.0 lb

## 2017-08-09 DIAGNOSIS — M533 Sacrococcygeal disorders, not elsewhere classified: Secondary | ICD-10-CM

## 2017-08-09 DIAGNOSIS — E876 Hypokalemia: Secondary | ICD-10-CM | POA: Diagnosis not present

## 2017-08-09 DIAGNOSIS — Z1231 Encounter for screening mammogram for malignant neoplasm of breast: Secondary | ICD-10-CM

## 2017-08-09 DIAGNOSIS — M999 Biomechanical lesion, unspecified: Secondary | ICD-10-CM

## 2017-08-09 LAB — BASIC METABOLIC PANEL
BUN: 16 mg/dL (ref 6–23)
CALCIUM: 9.7 mg/dL (ref 8.4–10.5)
CHLORIDE: 98 meq/L (ref 96–112)
CO2: 34 meq/L — AB (ref 19–32)
CREATININE: 0.77 mg/dL (ref 0.40–1.20)
GFR: 86.48 mL/min (ref >60.00)
Glucose, Bld: 107 mg/dL — ABNORMAL HIGH (ref 70–99)
Potassium: 3.4 mEq/L — ABNORMAL LOW (ref 3.5–5.1)
SODIUM: 139 meq/L (ref 135–145)

## 2017-08-09 MED FILL — HYDROCHLOROTHIAZIDE 25 MG T: 25 | 90 days supply | Qty: 90 | Fill #0

## 2017-08-09 NOTE — Patient Instructions (Signed)
Good to see you  Keep it up! Keep hands within peripheral vision  Keep increasing activity but slowly and listen to your body  See me again in 6-8 weeks

## 2017-08-09 NOTE — Assessment & Plan Note (Signed)
Decision today to treat with OMT was based on Physical Exam  After verbal consent patient was treated with HVLA, ME, FPR techniques in  thoracic, lumbar and sacral areas  Patient tolerated the procedure well with improvement in symptoms  Patient given exercises, stretches and lifestyle modifications  See medications in patient instructions if given  Patient will follow up in 4-6 weeks

## 2017-08-09 NOTE — Assessment & Plan Note (Signed)
Continues to have significant difficulty.  Patient continues to have pain that seems to come and go.  Has made some progress with patient's Cymbalta.  At 60 mg daily.  Continue the same dose.  Has tramadol for breakthrough pain.  Discussed icing regimen and home exercises and core strengthening.  Still follows up with me for osteopathic manipulation and follow-up again in 4 to 6 weeks

## 2017-08-11 DIAGNOSIS — M545 Low back pain: Secondary | ICD-10-CM | POA: Diagnosis not present

## 2017-08-12 ENCOUNTER — Encounter: Payer: Self-pay | Admitting: Internal Medicine

## 2017-08-12 IMAGING — MR MR LUMBAR SPINE W/O CM
4 of 5 series · 26 of 48 positions shown · non-contrast
Comparison: MRI lumbar spine 10/03/2011.

CLINICAL DATA: Chronic right-sided low back pain. Right SI joint
and hip pain. Right upper leg pain for 4 years. Increasing pain over
the last 1 year.

EXAM:
MRI LUMBAR SPINE WITHOUT CONTRAST
TECHNIQUE: Multiplanar, multisequence MR imaging of the lumbar spine was
performed. No intravenous contrast was administered.

[Series 2: T2 · sagittal · 4.0mm · 0.44mm/px · 6 of 12 slices shown (1 of 2)]
[im 1/12]
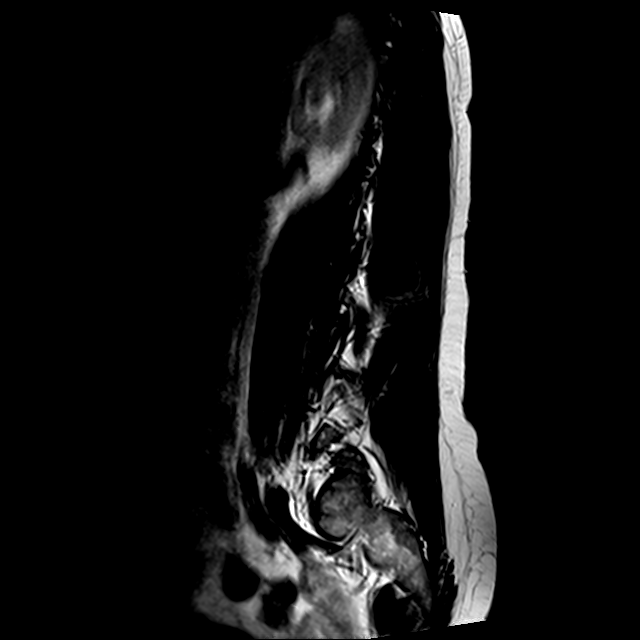
[im 3/12]
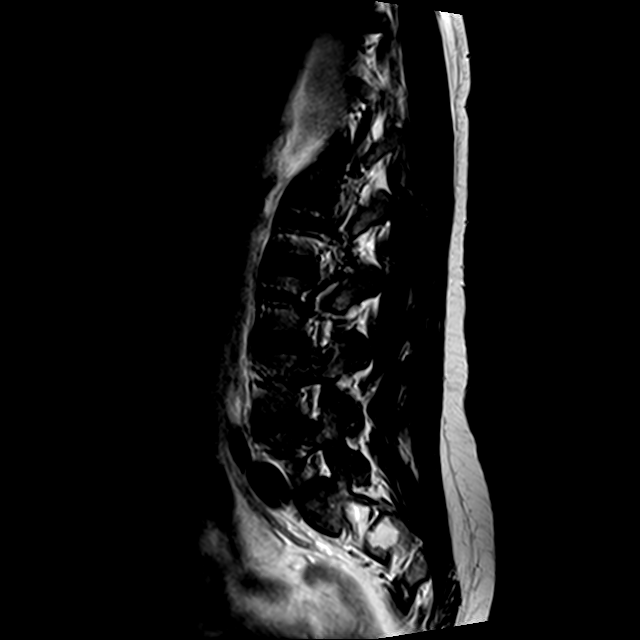
[im 5/12]
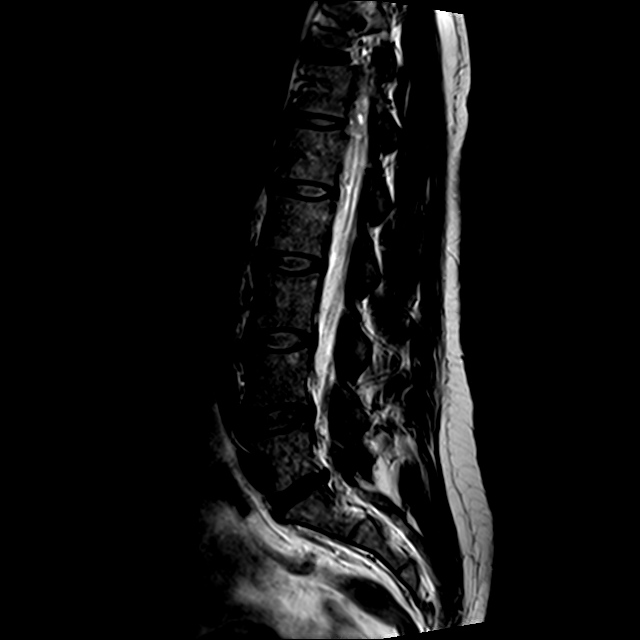
[im 7/12]
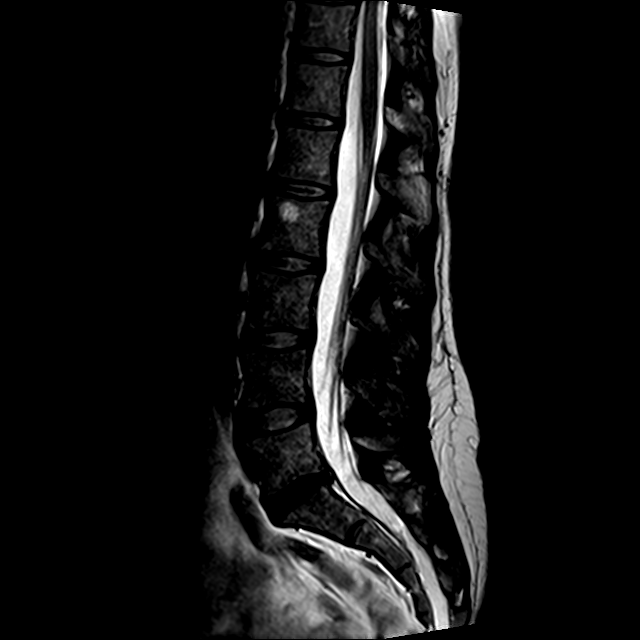
[im 9/12]
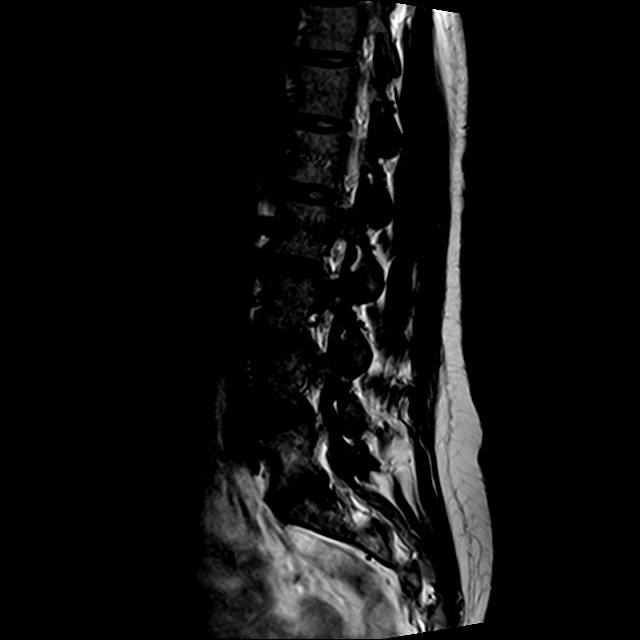
[im 12/12]
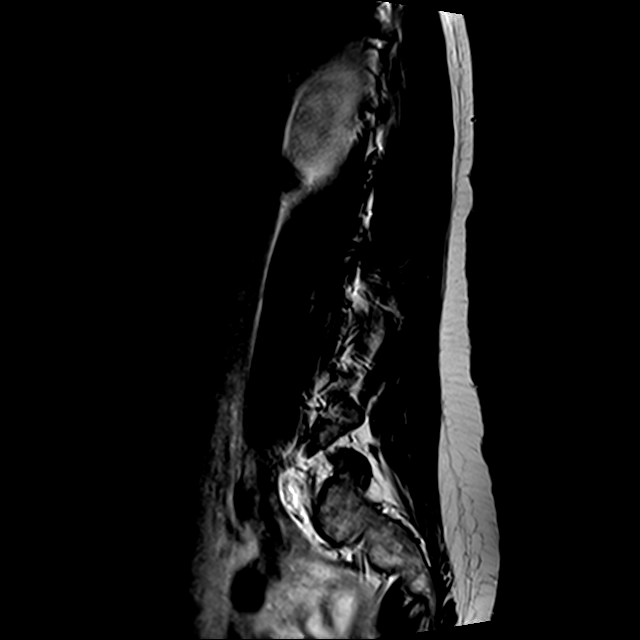

[Series 4: T1 · sagittal · 4.0mm · 0.88mm/px · 6 of 12 slices shown (1 of 2)]
[im 1/12]
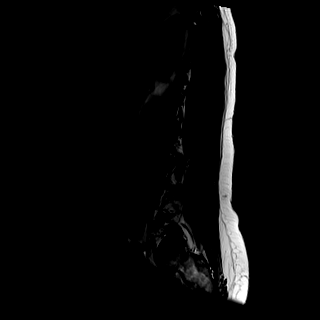
[im 3/12]
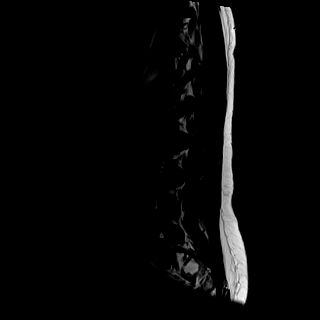
[im 5/12]
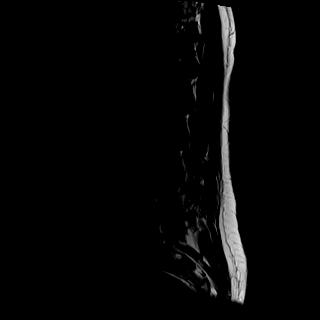
[im 7/12]
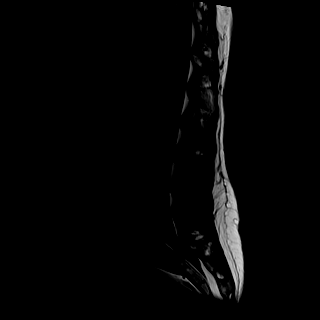
[im 9/12]
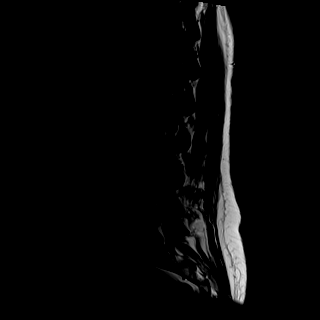
[im 12/12]
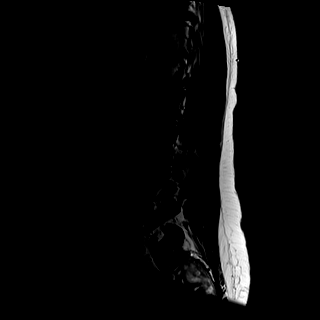

[Series 5: T1 · axial · 4.0mm · 0.37mm/px · z∈[-100,+46]mm · 5 of 30 slices shown (2 of 2)]
[im 1/30]
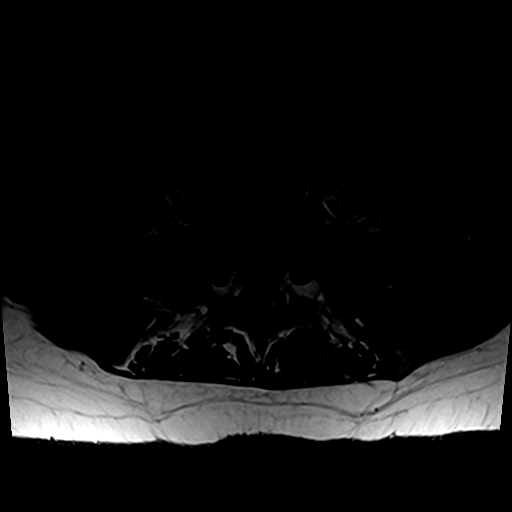
[im 5/30]
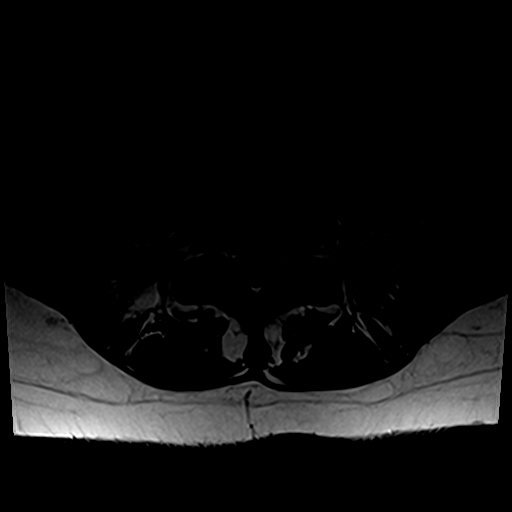
[im 9/30]
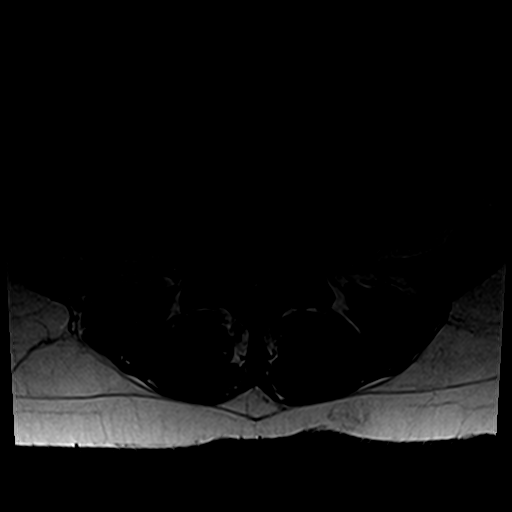
[im 15/30]
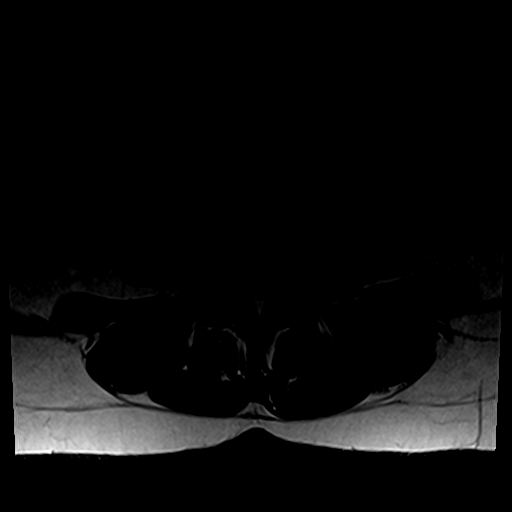
[im 25/30]
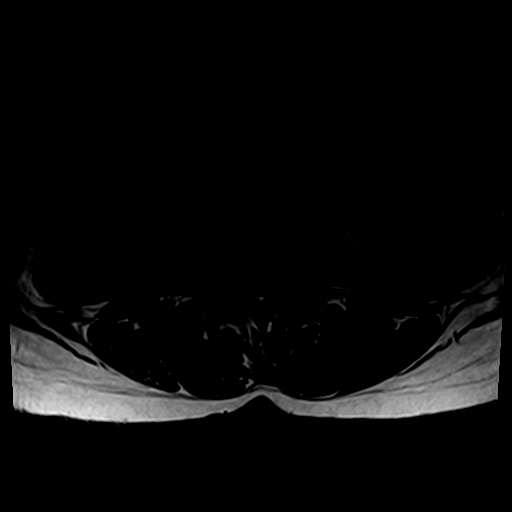

[Series 7: T2 · axial · 4.0mm · 0.74mm/px · z∈[-99,+91]mm · 9 of 30 slices shown (2 of 2)]
[im 1/30]
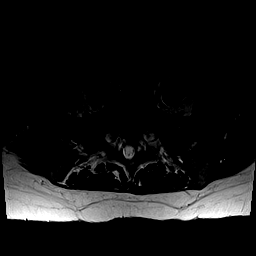
[im 5/30]
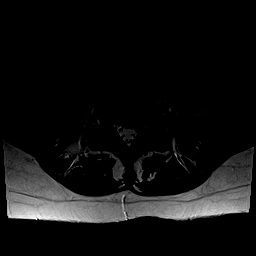
[im 9/30]
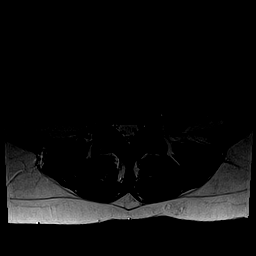
[im 13/30]
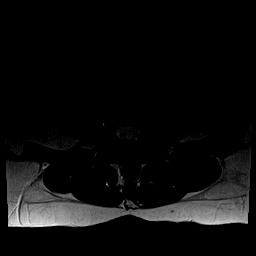
[im 15/30]
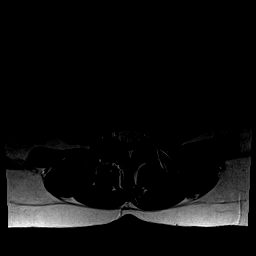
[im 17/30]
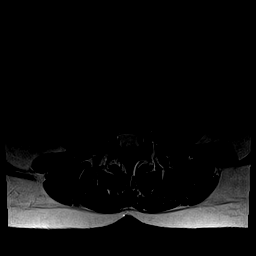
[im 21/30]
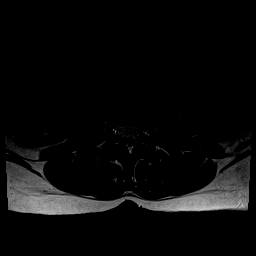
[im 25/30]
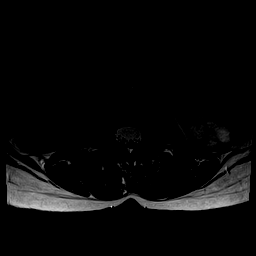
[im 30/30]
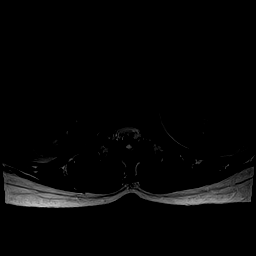

[26 of 48 positions shown; findings below may reference images not displayed]

FINDINGS: Segmentation: 5 non rib-bearing lumbar type vertebral bodies are
present.

Alignment:  AP alignment is anatomic

Vertebrae: Marrow signal and vertebral body heights are normal. A
benign hemangioma is present at L2

Conus medullaris: Extends to the L1 level and appears normal.

Paraspinal and other soft tissues: Multiple bilateral renal cysts
are again noted bilaterally. No other focal lesions are present.
There is no significant adenopathy.

Disc levels: The disc levels at L3-4 and above are normal.

L4-5: Mild bilateral facet hypertrophy has progressed. There is no
significant focal disc protrusion or stenosis.

L5-S1: A leftward disc protrusion has slightly progressed. Mild left
subarticular narrowing is present. The disc contacts the traversing
left S1 nerve roots. The foramina are patent bilaterally.
IMPRESSION: 1. Slight progression of leftward disc protrusion at L5-S1 results
in a mild left subarticular stenosis with probable contact of the
traversing left S1 nerve root.
2. Slight progression of mild bilateral facet hypertrophy at L4-5
without significant stenosis.

## 2017-08-31 ENCOUNTER — Encounter: Payer: Self-pay | Admitting: Family Medicine

## 2017-08-31 ENCOUNTER — Ambulatory Visit
Admission: RE | Admit: 2017-08-31 | Discharge: 2017-08-31 | Disposition: A | Discharge status: Home / Self Care | Payer: 59 | Source: Ambulatory Visit | Attending: Obstetrics & Gynecology | Admitting: Obstetrics & Gynecology

## 2017-08-31 DIAGNOSIS — Z1231 Encounter for screening mammogram for malignant neoplasm of breast: Secondary | ICD-10-CM | POA: Diagnosis not present

## 2017-09-01 DIAGNOSIS — M545 Low back pain: Secondary | ICD-10-CM | POA: Diagnosis not present

## 2017-09-01 MED ORDER — PREDNISONE 50 MG PO TABS: 50.0000 mg | ORAL_TABLET | Freq: Every day | ORAL | 0 refills | Status: DC

## 2017-09-01 MED FILL — predniSONE 50 MG TABS: 50 | 5 days supply | Qty: 5 | Fill #0

## 2017-09-06 DIAGNOSIS — M545 Low back pain: Secondary | ICD-10-CM | POA: Diagnosis not present

## 2017-09-17 DIAGNOSIS — M545 Low back pain: Secondary | ICD-10-CM | POA: Diagnosis not present

## 2017-09-27 ENCOUNTER — Ambulatory Visit: Payer: 59 | Admitting: Family Medicine

## 2017-09-28 MED FILL — DULoxetine HCL 60 MG CPEP: 60 | 90 days supply | Qty: 90 | Fill #1

## 2017-10-08 ENCOUNTER — Ambulatory Visit: Payer: 59 | Admitting: Family Medicine

## 2017-10-08 ENCOUNTER — Encounter: Payer: Self-pay | Admitting: Family Medicine

## 2017-10-08 VITALS — BP 102/78 | HR 87 | Ht 63.0 in | Wt 148.0 lb

## 2017-10-08 DIAGNOSIS — M2550 Pain in unspecified joint: Secondary | ICD-10-CM | POA: Diagnosis not present

## 2017-10-08 DIAGNOSIS — M999 Biomechanical lesion, unspecified: Secondary | ICD-10-CM

## 2017-10-08 MED ORDER — LEVOTHYROXINE SODIUM 50 MCG PO TABS: 50.0000 ug | ORAL_TABLET | Freq: Every day | ORAL | 3 refills | Status: DC

## 2017-10-08 MED FILL — LEVOTHYROXINE 50 MCG TABLET: 50 | 90 days supply | Qty: 90 | Fill #0

## 2017-10-08 NOTE — Assessment & Plan Note (Signed)
Continues to have difficulty with polyarthralgia.  Differential includes chronic pain syndrome, as well as autoimmune diseases.  Patient has not responded greatly to conservative therapy.  Patient has still had some improvement with the Cymbalta.  Continue the 60 mg and add low-dose Synthroid as an adjunct therapy.  Patient does respond somewhat to osteopathic manipulation.  Discussed icing regimen.  Follow-up again in 4 weeks

## 2017-10-08 NOTE — Assessment & Plan Note (Addendum)
Decision today to treat with OMT was based on Physical Exam  After verbal consent patient was treated with HVLA, ME, FPR techniques in cervical, thoracic, rib lumbar and sacral and pelvic areas  Patient tolerated the procedure well with improvement in symptoms  Patient given exercises, stretches and lifestyle modifications  See medications in patient instructions if given  Patient will follow up in 4-6 weeks

## 2017-10-08 NOTE — Patient Instructions (Signed)
Good to see you  I am sorry this is difficult  Ice is your friend.  1559m calcium pyruvate daily  Continue the cymbalta  Lets hold on the iron  Start 68grams of miralax in 64 oz of gatorade  Synthroid 540m daily  Send me a message in 3 weeks to tell me how it is dong  See em agai nin 6 weeks

## 2017-10-08 NOTE — Progress Notes (Signed)
[name] [address], [place] [address] Phone: [phone] Subjective:    CC: Back pain  MCN:[id number]  [name] is a 42 y.o. female coming in with complaint of back pain. She has not had any changes since last visit. Having a lot of other autoimmune symptoms. Back pain has stabilized.  Patient states that continues to have aching pain all the time.  Continues to have swelling of joints and seems to be partly articular changes.  Seems to be more in the lower extremity recently and when standing on the long amount of time.  Patient still does not feel quite like herself.  Does not know if she is going to get answers from rheumatology at this time.     Past Medical History:  Diagnosis Date  . Allergy   . Hypertension    Past Surgical History:  Procedure Laterality Date  . CHOLECYSTECTOMY    . TUBAL LIGATION     Social History   Socioeconomic History  . Marital status: Married    Spouse name: Not on file  . Number of children: Not on file  . Years of education: Not on file  . Highest education level: Not on file  Occupational History  . Not on file  Social Needs  . Financial resource strain: Not on file  . Food insecurity:    Worry: Not on file    Inability: Not on file  . Transportation needs:    Medical: Not on file    Non-medical: Not on file  Tobacco Use  . Smoking status: Never Smoker  . Smokeless tobacco: Never Used  Substance and Sexual Activity  . Alcohol use: Yes  . Drug use: No  . Sexual activity: Yes  Lifestyle  . Physical activity:    Days per week: Not on file    Minutes per session: Not on file  . Stress: Not on file  Relationships  . Social connections:    Talks on phone: Not on file    Gets together: Not on file    Attends religious service: Not on file    Active member of club or organization: Not on file    Attends meetings of clubs or organizations: Not on file    Relationship status: Not on  file  Other Topics Concern  . 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 Lamisil [Terbinafine] Hives and Itching  . Terbinafine Hcl Hives   Family History  Problem Relation Age of Onset  . Cancer Mother        Breast Cancer  . Breast cancer Mother   . Hypertension Father   . Hyperlipidemia Father   . Other Brother        lyme  . Stroke Maternal Grandmother   . Alcohol abuse Paternal Grandmother   . Heart disease Paternal Grandfather      Past medical history, social, surgical and family history all reviewed in electronic medical record.  No pertanent information unless stated regarding to the chief complaint.   Review of Systems:Review of systems updated and as accurate as of 10/08/17  No headache, visual changes, nausea, vomiting, diarrhea, constipation, dizziness, abdominal pain, skin rash, fevers, chills, night sweats, weight loss, swollen lymph nodes, body aches, joint swelling, chest pain, shortness of breath, mood changes.  Positive muscle aches  Objective  Blood pressure 102/78, pulse  87, height 5' 3"  (1.6 m), weight 148 lb (67.1 kg), SpO2 98 %. Systems examined below as of 10/08/17   General: No apparent distress alert and oriented x3 mood and affect normal, dressed appropriately.  HEENT: Pupils equal, extraocular movements intact  Respiratory: Patient's speak in full sentences and does not appear short of breath  Cardiovascular: No lower extremity edema, non tender, no erythema  Skin: Warm dry intact with no signs of infection or rash on extremities or on axial skeleton.  Abdomen: Soft nontender  Neuro: Cranial nerves II through XII are intact, neurovascularly intact in all extremities with 2+ DTRs and 2+ pulses.  Lymph: No lymphadenopathy of posterior or anterior cervical chain or axillae bilaterally.  Gait normal with good balance and coordination.  MSK:  tender with full range of  motion and good stability and symmetric strength and tone of shoulders, elbows, wrist, hip, knee and ankles bilaterally.  No swelling noted of multiple joints  Back Exam:  Inspection: Unremarkable  Motion: Flexion 40 deg, Extension 25 deg, Side Bending to 35 deg bilaterally,  Rotation to 35 deg bilaterally  SLR laying: Negative  XSLR laying: Negative  Palpable tenderness: Diffuse tenderness to palpation over the paraspinal musculature lumbar spine. FABER: Positive bilaterally. Sensory change: Gross sensation intact to all lumbar and sacral dermatomes.  Reflexes: 2+ at both patellar tendons, 2+ at achilles tendons, Babinski's downgoing.  Strength at foot  4 out of 5 but symmetric  Osteopathic findings C2 flexed rotated and side bent right T3 extended rotated and side bent right inhaled third rib T9 extended rotated and side bent left L2 flexed rotated and side bent right Sacrum right on right     Impression and Recommendations:     This case required medical decision making of moderate complexity.      Note: This dictation was prepared with Dragon dictation along with smaller phrase technology. Any transcriptional errors that result from this process are unintentional.

## 2017-10-20 DIAGNOSIS — M545 Low back pain: Secondary | ICD-10-CM | POA: Diagnosis not present

## 2017-11-04 DIAGNOSIS — M545 Low back pain: Secondary | ICD-10-CM | POA: Diagnosis not present

## 2017-11-09 MED FILL — HYDROCHLOROTHIAZIDE 25 MG T: 25 | 90 days supply | Qty: 90 | Fill #1

## 2017-11-09 MED FILL — AMLODIPINE BESYLATE 5 MG TA: 5 | 90 days supply | Qty: 90 | Fill #1

## 2017-11-17 ENCOUNTER — Encounter: Payer: Self-pay | Admitting: Family Medicine

## 2017-11-18 DIAGNOSIS — M545 Low back pain: Secondary | ICD-10-CM | POA: Diagnosis not present

## 2017-11-19 NOTE — Progress Notes (Signed)
[name] [address] [place], [place] [address] Phone: [phone] Subjective:    I [name] am serving as a Education administrator for [name].  CC: Back pain follow-up  [id number]  [name] is a 42 y.o. female coming in with complaint of back pain. States that she has been sore lately. Has been using ice packs for pain.  Patient states that she continues to have discomfort and pain.  Has been seen by rheumatology which did not have any significant findings.  Has started Cymbalta but did not feel it was making significant improvement.  Found to have iron deficiency.  Seen [name] and had different injections.  Patient is looking for any answer and thinks referral to integrative medicine could be beneficial.  Back pain does seems to be relieved a little bit with manipulation.  Just does not feel like she should be in as much pain as she has.      Past Medical History:  Diagnosis Date  . Allergy   . Hypertension    Past Surgical History:  Procedure Laterality Date  . CHOLECYSTECTOMY    . TUBAL LIGATION     Social History   Socioeconomic History  . Marital status: Married    Spouse name: Not on file  . Number of children: Not on file  . Years of education: Not on file  . Highest education level: Not on file  Occupational History  . Not on file  Social Needs  . Financial resource strain: Not on file  . Food insecurity:    Worry: Not on file    Inability: Not on file  . Transportation needs:    Medical: Not on file    Non-medical: Not on file  Tobacco Use  . Smoking status: Never Smoker  . Smokeless tobacco: Never Used  Substance and Sexual Activity  . Alcohol use: Yes  . Drug use: No  . Sexual activity: Yes  Lifestyle  . Physical activity:    Days per week: Not on file    Minutes per session: Not on file  . Stress: Not on file  Relationships  . Social connections:    Talks on phone: Not on file    Gets together:  Not on file    Attends religious service: Not on file    Active member of club or organization: Not on file    Attends meetings of clubs or organizations: Not on file    Relationship status: Not on file  Other Topics Concern  . 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 Lamisil [Terbinafine] Hives and Itching  . Terbinafine Hcl Hives   Family History  Problem Relation Age of Onset  . Cancer Mother        Breast Cancer  . Breast cancer Mother   . Hypertension Father   . Hyperlipidemia Father   . Other Brother        lyme  . Stroke Maternal Grandmother   . Alcohol abuse Paternal Grandmother   . Heart disease Paternal Grandfather     Current Outpatient Medications (Endocrine & Metabolic):  .  levothyroxine (SYNTHROID, LEVOTHROID) 50 MCG tablet, Take 1 tablet (50 mcg total) by mouth daily. .  predniSONE (DELTASONE) 50 MG tablet, Take 1 tablet (50 mg total) by mouth daily. (Patient not taking: Reported on 11/22/2017)  Current Outpatient Medications (  Cardiovascular):  .  amLODipine (NORVASC) 5 MG tablet, Take 1 tablet (5 mg total) by mouth daily. .  hydrochlorothiazide (HYDRODIURIL) 25 MG tablet, Take 1 tablet (25 mg total) by mouth daily.  Current Outpatient Medications (Respiratory):  Marland Kitchen  Fexofenadine HCl (ALLEGRA PO), Take by mouth.  Current Outpatient Medications (Analgesics):  .  acetaminophen (TYLENOL) 650 MG CR tablet, Take 650 mg by mouth every 8 (eight) hours as needed for pain. .  traMADol (ULTRAM) 50 MG tablet, Take 1 tablet (50 mg total) by mouth every 12 (twelve) hours as needed.  Current Outpatient Medications (Hematological):  Marland Kitchen  Ferrous Sulfate (IRON) 325 (65 Fe) MG TABS, Take by mouth daily.  Current Outpatient Medications (Other):  Marland Kitchen  Ascorbic Acid (VITAMIN C PO), Take by mouth daily. .  Cholecalciferol (VITAMIN D3) 2000 units TABS, Take 2,000 Units by mouth daily. .  DULoxetine  (CYMBALTA) 60 MG capsule, Take 1 capsule (60 mg total) by mouth daily. Marland Kitchen  gabapentin (NEURONTIN) 100 MG capsule, TAKE 2 CAPSULES BY MOUTH AT BEDTIME. Marland Kitchen  Misc Natural Products (CALCIUM PYRUVATE PO), Take by mouth. .  nitrofurantoin (MACRODANTIN) 50 MG capsule, TAKE AFTER INTERCOURSE .  ranitidine (ZANTAC) 150 MG tablet, Take 1 tablet (150 mg total) by mouth 2 (two) times daily. .  TURMERIC PO, Take by mouth daily.    Past medical history, social, surgical and family history all reviewed in electronic medical record.  No pertanent information unless stated regarding to the chief complaint.   Review of Systems:  No headache, visual changes, nausea, vomiting, diarrhea, constipation, dizziness, abdominal pain, skin rash, fevers, chills, night sweats, weight loss, swollen lymph nodes,  joint swelling,chest pain, shortness of breath, mood changes.  Positive muscle aches, body aches  Objective  Blood pressure 100/78, pulse (!) 105, height 5' 3"  (1.6 m), weight 147 lb (66.7 kg), SpO2 98 %.     General: No apparent distress alert and oriented x3 mood and affect normal, dressed appropriately.  HEENT: Pupils equal, extraocular movements intact  Respiratory: Patient's speak in full sentences and does not appear short of breath  Cardiovascular: No lower extremity edema, non tender, no erythema  Skin: Warm dry intact with no signs of infection or rash on extremities or on axial skeleton.  Abdomen: Soft nontender  Neuro: Cranial nerves II through XII are intact, neurovascularly intact in all extremities with 2+ DTRs and 2+ pulses.  Lymph: No lymphadenopathy of posterior or anterior cervical chain or axillae bilaterally.  Gait normal with good balance and coordination.  Patient does seem tight MSK:  tender with full range of motion and good stability and symmetric strength and tone of shoulders, elbows, wrist, hip, knee and ankles bilaterally.  Back exam shows loss of lordosis.  Patient does have  tightness noted of the lumbar spine.  Tenderness to palpation in the paraspinal musculature.  Positive Faber test bilaterally. Osteopathic findings C2 flexed rotated and side bent right C4 flexed rotated and side bent left T3 extended rotated and side bent right inhaled third rib T6 extended rotated and side bent left L2 flexed rotated and side bent right Sacrum right on right     Impression and Recommendations:     This case required medical decision making of moderate complexity. The above documentation has been reviewed and is accurate and complete Lyndal Pulley, DO       Note: This dictation was prepared with Dragon dictation along with smaller phrase technology. Any transcriptional errors that result from this process are  unintentional.

## 2017-11-22 ENCOUNTER — Ambulatory Visit: Payer: 59 | Admitting: Family Medicine

## 2017-11-22 ENCOUNTER — Encounter: Payer: Self-pay | Admitting: Family Medicine

## 2017-11-22 VITALS — BP 100/78 | HR 105 | Ht 63.0 in | Wt 147.0 lb

## 2017-11-22 DIAGNOSIS — M9904 Segmental and somatic dysfunction of sacral region: Secondary | ICD-10-CM

## 2017-11-22 DIAGNOSIS — M9908 Segmental and somatic dysfunction of rib cage: Secondary | ICD-10-CM

## 2017-11-22 DIAGNOSIS — M2550 Pain in unspecified joint: Secondary | ICD-10-CM

## 2017-11-22 DIAGNOSIS — M9901 Segmental and somatic dysfunction of cervical region: Secondary | ICD-10-CM

## 2017-11-22 DIAGNOSIS — M999 Biomechanical lesion, unspecified: Secondary | ICD-10-CM

## 2017-11-22 DIAGNOSIS — M9903 Segmental and somatic dysfunction of lumbar region: Secondary | ICD-10-CM

## 2017-11-22 NOTE — Assessment & Plan Note (Signed)
Decision today to treat with OMT was based on Physical Exam  After verbal consent patient was treated with HVLA, ME, FPR techniques in cervical, thoracic, rib,  lumbar and sacral areas  Patient tolerated the procedure well with improvement in symptoms  Patient given exercises, stretches and lifestyle modifications  See medications in patient instructions if given  Patient will follow up in 4-8 weeks

## 2017-11-22 NOTE — Assessment & Plan Note (Signed)
Continues to have significant continued pain.  Laboratory work-up did show some very mild abnormalities but patient is really not responding to any of the treatment options that we have done so far.  We discussed with patient about the possibility of integrative therapy and patient was sent referral per her request.  We discussed the icing regimen and home exercises.  Discussed the prednisone taken as needed.  Patient has been somewhat noncompliant with the exercises he states is from pain.  Has responded fairly well to manipulation.  Follow-up again in 4 to 8 weeks

## 2017-11-22 NOTE — Patient Instructions (Signed)
Good to see you  I am sorry we have not made great strides Will try to get you in with integrative medicine.  See me again for manipulation in 6-8 weeks

## 2017-12-02 ENCOUNTER — Encounter: Payer: Self-pay | Admitting: Internal Medicine

## 2017-12-02 DIAGNOSIS — M545 Low back pain: Secondary | ICD-10-CM | POA: Diagnosis not present

## 2017-12-04 MED ORDER — TRAMADOL HCL 50 MG PO TABS: 50.0000 mg | ORAL_TABLET | Freq: Two times a day (BID) | ORAL | 0 refills | Status: DC | PRN

## 2017-12-06 DIAGNOSIS — M545 Low back pain: Secondary | ICD-10-CM | POA: Diagnosis not present

## 2017-12-06 MED FILL — traMADol HCL 50 MG TABS: 50 | 15 days supply | Qty: 30 | Fill #0

## 2017-12-16 DIAGNOSIS — M545 Low back pain: Secondary | ICD-10-CM | POA: Diagnosis not present

## 2017-12-22 ENCOUNTER — Other Ambulatory Visit: Payer: Self-pay | Admitting: Family Medicine

## 2017-12-22 DIAGNOSIS — M545 Low back pain: Secondary | ICD-10-CM | POA: Diagnosis not present

## 2017-12-22 MED FILL — GABAPENTIN 100 MG CAP: 100 | 90 days supply | Qty: 180 | Fill #0

## 2017-12-22 NOTE — Telephone Encounter (Signed)
Refill done.  

## 2017-12-24 ENCOUNTER — Encounter: Payer: Self-pay | Admitting: Family Medicine

## 2017-12-24 MED ORDER — PREDNISONE 50 MG PO TABS: 50.0000 mg | ORAL_TABLET | Freq: Every day | ORAL | 0 refills | Status: DC

## 2017-12-24 MED FILL — predniSONE 50 MG TABS: 50 | 5 days supply | Qty: 5 | Fill #0

## 2017-12-27 MED FILL — DULoxetine HCL 60 MG CPEP: 60 | 90 days supply | Qty: 90 | Fill #2

## 2017-12-30 DIAGNOSIS — M545 Low back pain: Secondary | ICD-10-CM | POA: Diagnosis not present

## 2018-01-02 NOTE — Progress Notes (Signed)
[name] [address], [place] [address] Phone: [phone] Subjective:     CC: Back pain follow-up  [id number]  [name] is a 42 y.o. female coming in with complaint of back pain. She did take prednisone last week which did help. In pain today though but does state that as the day goes on.  Patient is going to be seen another rheumatologist in the near future.  Continues to have pains overall.  States that they are fairly severe.  Patient says the back pain seems to come and go.  Recent increase in neck pain as well but no radicular symptoms.  Try to be active and still finds that she does better with aquatic therapy than when she is doing significant weight lifting.      Past Medical History:  Diagnosis Date  . Allergy   . Hypertension    Past Surgical History:  Procedure Laterality Date  . CHOLECYSTECTOMY    . TUBAL LIGATION     Social History   Socioeconomic History  . Marital status: Married    Spouse name: Not on file  . Number of children: Not on file  . Years of education: Not on file  . Highest education level: Not on file  Occupational History  . Not on file  Social Needs  . Financial resource strain: Not on file  . Food insecurity:    Worry: Not on file    Inability: Not on file  . Transportation needs:    Medical: Not on file    Non-medical: Not on file  Tobacco Use  . Smoking status: Never Smoker  . Smokeless tobacco: Never Used  Substance and Sexual Activity  . Alcohol use: Yes  . Drug use: No  . Sexual activity: Yes  Lifestyle  . Physical activity:    Days per week: Not on file    Minutes per session: Not on file  . Stress: Not on file  Relationships  . Social connections:    Talks on phone: Not on file    Gets together: Not on file    Attends religious service: Not on file    Active member of club or organization: Not on file    Attends meetings of clubs or organizations: Not on file   Relationship status: Not on file  Other Topics Concern  . 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 Lamisil [Terbinafine] Hives and Itching  . Terbinafine Hcl Hives   Family History  Problem Relation Age of Onset  . Cancer Mother        Breast Cancer  . Breast cancer Mother   . Hypertension Father   . Hyperlipidemia Father   . Other Brother        lyme  . Stroke Maternal Grandmother   . Alcohol abuse Paternal Grandmother   . Heart disease Paternal Grandfather     Current Outpatient Medications (Endocrine & Metabolic):  .  levothyroxine (SYNTHROID, LEVOTHROID) 50 MCG tablet, Take 1 tablet (50 mcg total) by mouth daily. .  predniSONE (DELTASONE) 50 MG tablet, Take 1 tablet (50 mg total) by mouth daily.  Current Outpatient Medications (Cardiovascular):  .  amLODipine (NORVASC) 5 MG tablet, Take 1 tablet (5 mg total) by mouth daily. .  hydrochlorothiazide (HYDRODIURIL) 25 MG tablet, Take 1 tablet (25 mg total) by mouth daily.  Current  Outpatient Medications (Respiratory):  Marland Kitchen  Fexofenadine HCl (ALLEGRA PO), Take by mouth.  Current Outpatient Medications (Analgesics):  .  acetaminophen (TYLENOL) 650 MG CR tablet, Take 650 mg by mouth every 8 (eight) hours as needed for pain. .  traMADol (ULTRAM) 50 MG tablet, Take 1 tablet (50 mg total) by mouth every 12 (twelve) hours as needed.  Current Outpatient Medications (Hematological):  Marland Kitchen  Ferrous Sulfate (IRON) 325 (65 Fe) MG TABS, Take by mouth daily.  Current Outpatient Medications (Other):  Marland Kitchen  Ascorbic Acid (VITAMIN C PO), Take by mouth daily. .  Cholecalciferol (VITAMIN D3) 2000 units TABS, Take 2,000 Units by mouth daily. .  DULoxetine (CYMBALTA) 60 MG capsule, Take 1 capsule (60 mg total) by mouth daily. Marland Kitchen  gabapentin (NEURONTIN) 100 MG capsule, TAKE 2 CAPSULES BY MOUTH AT BEDTIME. Marland Kitchen  Misc Natural Products (CALCIUM PYRUVATE PO), Take by mouth. .   nitrofurantoin (MACRODANTIN) 50 MG capsule, TAKE AFTER INTERCOURSE .  ranitidine (ZANTAC) 150 MG tablet, Take 1 tablet (150 mg total) by mouth 2 (two) times daily. .  TURMERIC PO, Take by mouth daily.    Past medical history, social, surgical and family history all reviewed in electronic medical record.  No pertanent information unless stated regarding to the chief complaint.   Review of Systems:  No headache, visual changes, nausea, vomiting, diarrhea, constipation, dizziness, abdominal pain, skin rash, fevers, chills, night sweats, weight loss, swollen lymph nodes,chest pain, shortness of breath, mood changes.  Positive muscle aches, body aches, intermittent swelling  Objective  Blood pressure 110/72, pulse 97, height 5' 3"  (1.6 m), weight 146 lb (66.2 kg), SpO2 98 %.    General: No apparent distress alert and oriented x3 mood and affect normal, dressed appropriately.  HEENT: Pupils equal, extraocular movements intact  Respiratory: Patient's speak in full sentences and does not appear short of breath  Cardiovascular: No lower extremity edema, non tender, no erythema  Skin: Warm dry intact with no signs of infection or rash on extremities or on axial skeleton.  Abdomen: Soft nontender  Neuro: Cranial nerves II through XII are intact, neurovascularly intact in all extremities with 2+ DTRs and 2+ pulses.  Lymph: No lymphadenopathy of posterior or anterior cervical chain or axillae bilaterally.  Gait normal with good balance and coordination.  MSK:  Non tender with full range of motion and good stability and symmetric strength and tone of shoulders, elbows, wrist, hip, knee and ankles bilaterally.  Back Exam:  Inspection: Unremarkable  Motion: Flexion 45 deg, Extension 25 deg, Side Bending to 35 deg bilaterally,  Rotation to 45 deg bilaterally  SLR laying: Negative significant tightness still noted XSLR laying: Negative  Palpable tenderness: Tender to palpation the paraspinal musculature  lumbar spine.Marland Kitchen FABER: Tightness bilaterally. Sensory change: Gross sensation intact to all lumbar and sacral dermatomes.  Reflexes: 2+ at both patellar tendons, 2+ at achilles tendons, Babinski's downgoing.  Strength at foot  Plantar-flexion: 5/5 Dorsi-flexion: 5/5 Eversion: 5/5 Inversion: 5/5  Leg strength  Quad: 5/5 Hamstring: 5/5 Hip flexor: 5/5 Hip abductors: 5/5    Osteopathic findings C3 flexed rotated and side bent leftt C6 flexed rotated and side bent left T3 extended rotated and side bent right inhaled third rib T7 extended rotated and side bent left L2 flexed rotated and side bent right Sacrum right on right Pelvic shear right    Impression and Recommendations:     This case required medical decision making of moderate complexity. The above documentation has been reviewed and is  accurate and complete Lyndal Pulley, DO       Note: This dictation was prepared with Dragon dictation along with smaller phrase technology. Any transcriptional errors that result from this process are unintentional.

## 2018-01-03 ENCOUNTER — Ambulatory Visit: Payer: 59 | Admitting: Family Medicine

## 2018-01-03 ENCOUNTER — Encounter: Payer: Self-pay | Admitting: Family Medicine

## 2018-01-03 VITALS — BP 110/72 | HR 97 | Ht 63.0 in | Wt 146.0 lb

## 2018-01-03 DIAGNOSIS — M999 Biomechanical lesion, unspecified: Secondary | ICD-10-CM | POA: Diagnosis not present

## 2018-01-03 DIAGNOSIS — M2550 Pain in unspecified joint: Secondary | ICD-10-CM

## 2018-01-03 DIAGNOSIS — M5416 Radiculopathy, lumbar region: Secondary | ICD-10-CM | POA: Diagnosis not present

## 2018-01-03 NOTE — Assessment & Plan Note (Signed)
Decision today to treat with OMT was based on Physical Exam  After verbal consent patient was treated with HVLA, ME, FPR techniques in cervical, thoracic, rib lumbar and sacral areas  Patient tolerated the procedure well with improvement in symptoms  Patient given exercises, stretches and lifestyle modifications  See medications in patient instructions if given  Patient will follow up in 4-8 weeks

## 2018-01-03 NOTE — Assessment & Plan Note (Signed)
Getting second opinion from another rheumatologist.

## 2018-01-03 NOTE — Assessment & Plan Note (Signed)
Has had epidural injections previously.  Patient does not feel like it is severe enough for 1 of those at this time.  Discussed icing regimen and home exercises.  Discussed which activities to doing which wants to avoid.  Discussed posture and ergonomics.  Discussed with patient about muscle strengthening exercises.  Patient is following up with a rheumatologist to see if anything is potentially contributing to some of the aches and pains.  Follow-up again in 4 to 8 weeks

## 2018-01-03 NOTE — Patient Instructions (Signed)
Good to see you  I am sorry I was late Stay active Get back in the pool  See me again in 6 weeks

## 2018-01-05 DIAGNOSIS — M545 Low back pain: Secondary | ICD-10-CM | POA: Diagnosis not present

## 2018-01-05 MED FILL — LEVOTHYROXINE 50 MCG TABLET: 50 | 90 days supply | Qty: 90 | Fill #1

## 2018-01-13 DIAGNOSIS — M545 Low back pain: Secondary | ICD-10-CM | POA: Diagnosis not present

## 2018-01-24 ENCOUNTER — Encounter: Payer: Self-pay | Admitting: Family Medicine

## 2018-01-27 DIAGNOSIS — M545 Low back pain: Secondary | ICD-10-CM | POA: Diagnosis not present

## 2018-01-31 ENCOUNTER — Ambulatory Visit: Payer: 59 | Admitting: Internal Medicine

## 2018-02-01 DIAGNOSIS — M2550 Pain in unspecified joint: Secondary | ICD-10-CM | POA: Diagnosis not present

## 2018-02-01 DIAGNOSIS — M545 Low back pain: Secondary | ICD-10-CM | POA: Diagnosis not present

## 2018-02-01 DIAGNOSIS — N912 Amenorrhea, unspecified: Secondary | ICD-10-CM | POA: Diagnosis not present

## 2018-02-01 DIAGNOSIS — G8929 Other chronic pain: Secondary | ICD-10-CM | POA: Diagnosis not present

## 2018-02-01 DIAGNOSIS — K219 Gastro-esophageal reflux disease without esophagitis: Secondary | ICD-10-CM | POA: Diagnosis not present

## 2018-02-01 DIAGNOSIS — M542 Cervicalgia: Secondary | ICD-10-CM | POA: Diagnosis not present

## 2018-02-01 DIAGNOSIS — R5382 Chronic fatigue, unspecified: Secondary | ICD-10-CM | POA: Diagnosis not present

## 2018-02-05 NOTE — Progress Notes (Signed)
Subjective:    Patient ID: [name], female    DOB: [birth year], 42 y.o.   MRN: [id number]  HPI The patient is here for follow up.  She saw integrative health at [facility] and had blood work done today.  She was advised an anti-inflammatory diet.   She does eat a lot of fast food, sugar in her diet and probably too much caffeine.  She plans on trying to make changes over the next 2 months and then has follow-up with them.  She is hoping that lifestyle changes will improve some of her chronic pain and other issues.  She is currently exercising minimally.  Hypertension: She is taking her medication daily. She is not always compliant with a low sodium diet.  She denies chest pain, palpitations, shortness of breath and regular headaches. She is exercising minimally - PT once a week, pilates once a week.  She does not monitor her blood pressure at home.    Hyperglycemia:  She is not compliant with a low sugar/carbohydrate diet.  She is exercising regularly -pilates once a week.  Recurrent UTI:  She takes macrobid prophylactically after intercourse.  Medication works well and she denies any recent UTIs.  GERD:  She is taking her pepcid 1-2 times daily as prescribed.  She denies any GERD symptoms and feels her GERD is well controlled.   Tramadol she takes 1-2 times a week.  She takes if for joint pain or her back if Tylenol does not help.  She tries to avoid it.  She takes some anti-inflammatories, but tries to avoid it.   Medications and allergies reviewed with patient and updated if appropriate.  Patient Active Problem List   Diagnosis Date Noted  . Nonallopathic lesion of rib cage 10/08/2017  . Hyperglycemia 07/27/2017  . Antalgic gait 04/19/2017  . Polyarthralgia 02/26/2016  . GERD (gastroesophageal reflux disease) 02/17/2016  . Cervical lymphadenopathy 01/21/2016  . Quadriceps muscle strain 10/14/2015  . Cough 09/14/2015  . Fullness of neck 09/14/2015  . Nonallopathic lesion of  thoracic region 08/23/2015  . Lumbar radiculopathy 08/02/2015  . 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 Scapular dysfunction 03/20/2015  . 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 Nonallopathic lesion of lumbosacral region 06/29/2014  . Nonallopathic lesion of sacral region 06/29/2014  . Nonallopathic lesion of pelvic region 06/29/2014  . Recurrent UTI 05/18/2013  . Back pain, acute 10/07/2010  . Allergic rhinitis 08/14/2010  . Essential hypertension, benign 01/22/2010    Current Outpatient Medications on File Prior to Visit  Medication Sig Dispense Refill  . acetaminophen (TYLENOL) 650 MG CR tablet Take 650 mg by mouth every 8 (eight) hours as needed for pain.    Marland Kitchen amLODipine (NORVASC) 5 MG tablet Take 1 tablet (5 mg total) by mouth daily. 90 tablet 3  . Ascorbic Acid (VITAMIN C PO) Take by mouth daily.    . Cholecalciferol (VITAMIN D3) 2000 units TABS Take 2,000 Units by mouth daily.    . DULoxetine (CYMBALTA) 60 MG capsule Take 1 capsule (60 mg total) by mouth daily. 90 capsule 3  . Ferrous Sulfate (IRON) 325 (65 Fe) MG TABS Take by mouth daily.    Marland Kitchen Fexofenadine HCl (ALLEGRA PO) Take by mouth.    . gabapentin (NEURONTIN) 100 MG capsule TAKE 2 CAPSULES BY MOUTH AT BEDTIME. 180 capsule 1  . hydrochlorothiazide (HYDRODIURIL) 25 MG tablet Take 1 tablet (25 mg total) by mouth daily. 90 tablet  3  . levothyroxine (SYNTHROID, LEVOTHROID) 50 MCG tablet Take 1 tablet (50 mcg total) by mouth daily. 90 tablet 3  . Misc Natural Products (CALCIUM PYRUVATE PO) Take by mouth.    . nitrofurantoin (MACRODANTIN) 50 MG capsule TAKE AFTER INTERCOURSE 45 capsule 0  . ranitidine (ZANTAC) 150 MG tablet Take 1 tablet (150 mg total) by mouth 2 (two) times daily. 180 tablet 3  . traMADol (ULTRAM) 50 MG tablet Take 1 tablet (50 mg total) by mouth every 12 (twelve) hours as needed. 30 tablet 0  . TURMERIC PO Take by mouth daily.     No current  facility-administered medications on file prior to visit.     Past Medical History:  Diagnosis Date  . Allergy   . Hypertension     Past Surgical History:  Procedure Laterality Date  . CHOLECYSTECTOMY    . TUBAL LIGATION      Social History   Socioeconomic History  . Marital status: Married    Spouse name: Not on file  . Number of children: Not on file  . Years of education: Not on file  . Highest education level: Not on file  Occupational History  . Not on file  Social Needs  . Financial resource strain: Not on file  . Food insecurity:    Worry: Not on file    Inability: Not on file  . Transportation needs:    Medical: Not on file    Non-medical: Not on file  Tobacco Use  . Smoking status: Never Smoker  . Smokeless tobacco: Never Used  Substance and Sexual Activity  . Alcohol use: Yes  . Drug use: No  . Sexual activity: Yes  Lifestyle  . Physical activity:    Days per week: Not on file    Minutes per session: Not on file  . Stress: Not on file  Relationships  . Social connections:    Talks on phone: Not on file    Gets together: Not on file    Attends religious service: Not on file    Active member of club or organization: Not on file    Attends meetings of clubs or organizations: Not on file    Relationship status: Not on file  Other Topics Concern  . 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 Cancer Mother        Breast Cancer  . Breast cancer Mother   . Hypertension Father   . Hyperlipidemia Father   . Other Brother        lyme  . Stroke Maternal Grandmother   . Alcohol abuse Paternal Grandmother   . Heart disease Paternal Grandfather     Review of Systems  Constitutional: Negative for chills and fever.  Respiratory: Negative for cough, shortness of breath and wheezing.   Cardiovascular: Positive for leg swelling (at end of day). Negative for  chest pain and palpitations.  Musculoskeletal: Positive for arthralgias and back pain.  Neurological: Positive for headaches (allergy related, occ). Negative for dizziness and light-headedness.       Objective:   Vitals:   02/07/18 1105  BP: 112/80  Pulse: 95  Resp: 16  Temp: 98.2 F (36.8 C)  SpO2: 99%   BP Readings from Last 3 Encounters:  02/07/18 112/80  01/03/18 110/72  11/22/17 100/78   Wt Readings from Last 3 Encounters:  02/07/18 146 lb 6.4 oz (66.4 kg)  01/03/18 146 lb (66.2 kg)  11/22/17 147 lb (66.7 kg)   Body mass index is 25.93 kg/m.   Physical Exam    Constitutional: Appears well-developed and well-nourished. No distress.  HENT:  Head: Normocephalic and atraumatic.  Neck: Neck supple. No tracheal deviation present. No thyromegaly present.  No cervical lymphadenopathy Cardiovascular: Normal rate, regular rhythm and normal heart sounds.   No murmur heard. No carotid bruit .  No edema Pulmonary/Chest: Effort normal and breath sounds normal. No respiratory distress. No has no wheezes. No rales.  Skin: Skin is warm and dry. Not diaphoretic.  Psychiatric: Normal mood and affect. Behavior is normal.      Assessment & Plan:    See Problem List for Assessment and Plan of chronic medical problems.

## 2018-02-06 DIAGNOSIS — R5382 Chronic fatigue, unspecified: Secondary | ICD-10-CM | POA: Insufficient documentation

## 2018-02-06 DIAGNOSIS — N912 Amenorrhea, unspecified: Secondary | ICD-10-CM | POA: Insufficient documentation

## 2018-02-07 ENCOUNTER — Ambulatory Visit: Payer: 59 | Admitting: Internal Medicine

## 2018-02-07 ENCOUNTER — Encounter: Payer: Self-pay | Admitting: Internal Medicine

## 2018-02-07 VITALS — BP 112/80 | HR 95 | Temp 98.2°F | Resp 16 | Ht 63.0 in | Wt 146.4 lb

## 2018-02-07 DIAGNOSIS — N390 Urinary tract infection, site not specified: Secondary | ICD-10-CM

## 2018-02-07 DIAGNOSIS — M2550 Pain in unspecified joint: Secondary | ICD-10-CM | POA: Diagnosis not present

## 2018-02-07 DIAGNOSIS — M545 Low back pain: Secondary | ICD-10-CM | POA: Diagnosis not present

## 2018-02-07 DIAGNOSIS — K219 Gastro-esophageal reflux disease without esophagitis: Secondary | ICD-10-CM | POA: Diagnosis not present

## 2018-02-07 DIAGNOSIS — R5382 Chronic fatigue, unspecified: Secondary | ICD-10-CM | POA: Diagnosis not present

## 2018-02-07 DIAGNOSIS — N912 Amenorrhea, unspecified: Secondary | ICD-10-CM | POA: Diagnosis not present

## 2018-02-07 DIAGNOSIS — R739 Hyperglycemia, unspecified: Secondary | ICD-10-CM

## 2018-02-07 DIAGNOSIS — I10 Essential (primary) hypertension: Secondary | ICD-10-CM | POA: Diagnosis not present

## 2018-02-07 MED ORDER — FAMOTIDINE 20 MG PO TABS: 20.0000 mg | ORAL_TABLET | Freq: Two times a day (BID) | ORAL | Status: DC

## 2018-02-07 NOTE — Patient Instructions (Addendum)
  Medications reviewed and updated.  Changes include :   none    Please followup in 6 months

## 2018-02-07 NOTE — Assessment & Plan Note (Signed)
Taking Macrobid as needed after intercourse Effective, continue

## 2018-02-07 NOTE — Assessment & Plan Note (Signed)
Not compliant with a low sugar/carbohydrate diet We will be making dietary changes, which may help Will check A1c in 6 months

## 2018-02-07 NOTE — Assessment & Plan Note (Signed)
She is doing pain in multiple joints and back pain Taking tramadol only as needed when Tylenol is not effective-takes at 1-2 times a week Her stomach does not tolerate NSAIDs We will continue tramadol as needed Has been to integrative health at [facility] -had blood work done today and will start increasing her exercise and eating anti-inflammatory diet

## 2018-02-07 NOTE — Assessment & Plan Note (Signed)
Taking Pepcid regularly for GERD symptoms Will be working on diet changes, which will likely help her get off the medication

## 2018-02-07 NOTE — Assessment & Plan Note (Addendum)
BP well controlled Current regimen effective and well tolerated Continue current medications at current doses  

## 2018-02-09 MED FILL — AMLODIPINE BESYLATE 5 MG TA: 5 | 90 days supply | Qty: 90 | Fill #2

## 2018-02-09 MED FILL — HYDROCHLOROTHIAZIDE 25 MG T: 25 | 90 days supply | Qty: 90 | Fill #2

## 2018-02-13 NOTE — Progress Notes (Signed)
[name] [address], [place] [address] Phone: [phone] Subjective:   [name], am serving as a scribe for Dr. Hulan Saas. I'm seeing this patient by the request  of:    CC: Neck pain and back pain follow-up  [id number]  [name] is a 42 y.o. female coming in with complaint of neck and back pain follow-up.  Patient does have chronic pain from time to time.  Patient has responded well to manipulation.  Continues to have discomfort.  Sometimes can even stop patient from regular activities.  Patient states that she has been worse previously.  Seen a new integrative therapy doctor that she thinks is helpful and is changing diet.     Past Medical History:  Diagnosis Date  . Allergy   . Hypertension    Past Surgical History:  Procedure Laterality Date  . CHOLECYSTECTOMY    . TUBAL LIGATION     Social History   Socioeconomic History  . Marital status: Married    Spouse name: Not on file  . Number of children: Not on file  . Years of education: Not on file  . Highest education level: Not on file  Occupational History  . Not on file  Social Needs  . Financial resource strain: Not on file  . Food insecurity:    Worry: Not on file    Inability: Not on file  . Transportation needs:    Medical: Not on file    Non-medical: Not on file  Tobacco Use  . Smoking status: Never Smoker  . Smokeless tobacco: Never Used  Substance and Sexual Activity  . Alcohol use: Yes  . Drug use: No  . Sexual activity: Yes  Lifestyle  . Physical activity:    Days per week: Not on file    Minutes per session: Not on file  . Stress: Not on file  Relationships  . Social connections:    Talks on phone: Not on file    Gets together: Not on file    Attends religious service: Not on file    Active member of club or organization: Not on file    Attends meetings of clubs or organizations: Not on file    Relationship status: Not on file    Other Topics Concern  . 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 Lamisil [Terbinafine] Hives and Itching  . Terbinafine Hcl Hives   Family History  Problem Relation Age of Onset  . Cancer Mother        Breast Cancer  . Breast cancer Mother   . Hypertension Father   . Hyperlipidemia Father   . Other Brother        lyme  . Stroke Maternal Grandmother   . Alcohol abuse Paternal Grandmother   . Heart disease Paternal Grandfather     Current Outpatient Medications (Endocrine & Metabolic):  .  levothyroxine (SYNTHROID, LEVOTHROID) 50 MCG tablet, Take 1 tablet (50 mcg total) by mouth daily.  Current Outpatient Medications (Cardiovascular):  .  amLODipine (NORVASC) 5 MG tablet, Take 1 tablet (5 mg total) by mouth daily. .  hydrochlorothiazide (HYDRODIURIL) 25 MG tablet, Take 1 tablet (25 mg total) by mouth daily.  Current Outpatient Medications (Respiratory):  Marland Kitchen  Fexofenadine HCl (ALLEGRA PO), Take by mouth.  Current Outpatient Medications (Analgesics):  .  acetaminophen (TYLENOL) 650  MG CR tablet, Take 650 mg by mouth every 8 (eight) hours as needed for pain. .  traMADol (ULTRAM) 50 MG tablet, Take 1 tablet (50 mg total) by mouth every 12 (twelve) hours as needed.  Current Outpatient Medications (Hematological):  Marland Kitchen  Ferrous Sulfate (IRON) 325 (65 Fe) MG TABS, Take by mouth daily.  Current Outpatient Medications (Other):  Marland Kitchen  Ascorbic Acid (VITAMIN C PO), Take by mouth daily. .  Cholecalciferol (VITAMIN D3) 2000 units TABS, Take 2,000 Units by mouth daily. .  DULoxetine (CYMBALTA) 60 MG capsule, Take 1 capsule (60 mg total) by mouth daily. .  famotidine (PEPCID) 20 MG tablet, Take 1 tablet (20 mg total) by mouth 2 (two) times daily. Marland Kitchen  gabapentin (NEURONTIN) 100 MG capsule, TAKE 2 CAPSULES BY MOUTH AT BEDTIME. Marland Kitchen  Misc Natural Products (CALCIUM PYRUVATE PO), Take by mouth. .  nitrofurantoin  (MACRODANTIN) 50 MG capsule, TAKE AFTER INTERCOURSE .  TURMERIC PO, Take by mouth daily.    Past medical history, social, surgical and family history all reviewed in electronic medical record.  No pertanent information unless stated regarding to the chief complaint.   Review of Systems:  No headache, visual changes, nausea, vomiting, diarrhea, constipation, dizziness, abdominal pain, skin rash, fevers, chills, night sweats, weight loss, swollen lymph nodes, body aches, joint swelling,  chest pain, shortness of breath, mood changes.  Positive muscle aches  Objective  Blood pressure 110/88, pulse 96, height 5' 3"  (1.6 m), weight 147 lb (66.7 kg), SpO2 99 %.   General: No apparent distress alert and oriented x3 mood and affect normal, dressed appropriately.  HEENT: Pupils equal, extraocular movements intact  Respiratory: Patient's speak in full sentences and does not appear short of breath  Cardiovascular: No lower extremity edema, non tender, no erythema  Skin: Warm dry intact with no signs of infection or rash on extremities or on axial skeleton.  Abdomen: Soft nontender  Neuro: Cranial nerves II through XII are intact, neurovascularly intact in all extremities with 2+ DTRs and 2+ pulses.  Lymph: No lymphadenopathy of posterior or anterior cervical chain or axillae bilaterally.  Gait normal with good balance and coordination.  MSK:  Non tender with full range of motion and good stability and symmetric strength and tone of shoulders, elbows, wrist, hip, knee and ankles bilaterally.  Back Exam:  Inspection: Loss of lordosis Motion: Flexion 35 deg, Extension 25 deg, Side Bending to 35 deg bilaterally, Rotation to 35 deg bilaterally  SLR laying: Negative  XSLR laying: Negative  Palpable tenderness: Tender to palpation the paraspinal musculature lumbar spine right greater than left. FABER: Positive Faber bilaterally. Sensory change: Gross sensation intact to all lumbar and sacral dermatomes.    Reflexes: 2+ at both patellar tendons, 2+ at achilles tendons, Babinski's downgoing.  Strength at foot  Plantar-flexion: 5/5 Dorsi-flexion: 5/5 Eversion: 5/5 Inversion: 5/5  Leg strength  Quad: 5/5 Hamstring: 5/5 Hip flexor: 5/5 Hip abductors: 4/5 but symmetric Gait unremarkable.  Osteopathic findings C2 flexed rotated and side bent right T3 extended rotated and side bent right inhaled third rib T6 extended rotated and side bent left L3 flexed rotated and side bent left  Sacrum right on right Pelvic shear noted left   Impression and Recommendations:     This case required medical decision making of moderate complexity. The above documentation has been reviewed and is accurate and complete Lyndal Pulley, DO       Note: This dictation was prepared with Dragon dictation along with smaller  Company secretary. Any transcriptional errors that result from this process are unintentional.

## 2018-02-14 ENCOUNTER — Encounter: Payer: Self-pay | Admitting: Family Medicine

## 2018-02-14 ENCOUNTER — Ambulatory Visit: Payer: 59 | Admitting: Family Medicine

## 2018-02-14 VITALS — BP 110/88 | HR 96 | Ht 63.0 in | Wt 147.0 lb

## 2018-02-14 DIAGNOSIS — M999 Biomechanical lesion, unspecified: Secondary | ICD-10-CM | POA: Diagnosis not present

## 2018-02-14 DIAGNOSIS — M2550 Pain in unspecified joint: Secondary | ICD-10-CM

## 2018-02-14 DIAGNOSIS — M5416 Radiculopathy, lumbar region: Secondary | ICD-10-CM

## 2018-02-14 DIAGNOSIS — M545 Low back pain: Secondary | ICD-10-CM | POA: Diagnosis not present

## 2018-02-14 NOTE — Assessment & Plan Note (Signed)
Discussed posture and ergonomics.  Discussed which activities to do which was to avoid.  Increase activity as tolerated.  Follow-up with me again in 4 to 8 weeks.  Mild changes made in patient's suggestions for ergonomics today.

## 2018-02-14 NOTE — Assessment & Plan Note (Signed)
Seen in integrative therapy doctor that hopefully will be beneficial.

## 2018-02-14 NOTE — Patient Instructions (Addendum)
Good to see you  Ice is your friend  Dennis Bast will do great  See me again in 6 weeks Happy holidays!

## 2018-02-14 NOTE — Assessment & Plan Note (Signed)
Decision today to treat with OMT was based on Physical Exam  After verbal consent patient was treated with HVLA, ME, FPR techniques in cervical, thoracic, rib lumbar and sacral areas  Patient tolerated the procedure well with improvement in symptoms  Patient given exercises, stretches and lifestyle modifications  See medications in patient instructions if given  Patient will follow up in 4-8 weeks

## 2018-02-21 DIAGNOSIS — M545 Low back pain: Secondary | ICD-10-CM | POA: Diagnosis not present

## 2018-03-16 DIAGNOSIS — M545 Low back pain: Secondary | ICD-10-CM | POA: Diagnosis not present

## 2018-03-24 DIAGNOSIS — M545 Low back pain: Secondary | ICD-10-CM | POA: Diagnosis not present

## 2018-03-24 MED FILL — DULoxetine HCL 60 MG CPEP: 60 | 90 days supply | Qty: 90 | Fill #3

## 2018-03-28 ENCOUNTER — Ambulatory Visit: Payer: 59 | Admitting: Family Medicine

## 2018-03-29 DIAGNOSIS — M545 Low back pain: Secondary | ICD-10-CM | POA: Diagnosis not present

## 2018-03-30 DIAGNOSIS — I10 Essential (primary) hypertension: Secondary | ICD-10-CM | POA: Diagnosis not present

## 2018-03-30 DIAGNOSIS — R5382 Chronic fatigue, unspecified: Secondary | ICD-10-CM | POA: Diagnosis not present

## 2018-03-30 DIAGNOSIS — M2550 Pain in unspecified joint: Secondary | ICD-10-CM | POA: Diagnosis not present

## 2018-03-30 DIAGNOSIS — E2749 Other adrenocortical insufficiency: Secondary | ICD-10-CM | POA: Diagnosis not present

## 2018-03-30 DIAGNOSIS — M533 Sacrococcygeal disorders, not elsewhere classified: Secondary | ICD-10-CM | POA: Diagnosis not present

## 2018-04-04 ENCOUNTER — Encounter: Payer: Self-pay | Admitting: Family Medicine

## 2018-04-04 ENCOUNTER — Ambulatory Visit (INDEPENDENT_AMBULATORY_CARE_PROVIDER_SITE_OTHER): Payer: 59 | Admitting: Family Medicine

## 2018-04-04 VITALS — BP 110/82 | HR 102 | Ht 63.0 in | Wt 142.0 lb

## 2018-04-04 DIAGNOSIS — M533 Sacrococcygeal disorders, not elsewhere classified: Secondary | ICD-10-CM

## 2018-04-04 DIAGNOSIS — M999 Biomechanical lesion, unspecified: Secondary | ICD-10-CM

## 2018-04-04 MED FILL — LEVOTHYROXINE 50 MCG TABLET: 50 | 90 days supply | Qty: 90 | Fill #2

## 2018-04-04 NOTE — Progress Notes (Signed)
[name] [address], [place] [address] Phone: [phone] Subjective:   [name], am serving as a scribe for Dr. Hulan Saas.   CC: Back pain  ION:[id number]  [name] is a 43 y.o. female coming in with complaint of back pain. She has been having good and bad days since last visit. She states that the weather adversely affects her back a pain increases.  Patient is responded fairly well to manipulation previously.  States that she is seeing an integrative physician that has started some new medications and may be making some improvement.  Patient is also doing the whole 30.    Past Medical History:  Diagnosis Date  . Allergy   . Hypertension    Past Surgical History:  Procedure Laterality Date  . CHOLECYSTECTOMY    . TUBAL LIGATION     Social History   Socioeconomic History  . Marital status: Married    Spouse name: Not on file  . Number of children: Not on file  . Years of education: Not on file  . Highest education level: Not on file  Occupational History  . Not on file  Social Needs  . Financial resource strain: Not on file  . Food insecurity:    Worry: Not on file    Inability: Not on file  . Transportation needs:    Medical: Not on file    Non-medical: Not on file  Tobacco Use  . Smoking status: Never Smoker  . Smokeless tobacco: Never Used  Substance and Sexual Activity  . Alcohol use: Yes  . Drug use: No  . Sexual activity: Yes  Lifestyle  . Physical activity:    Days per week: Not on file    Minutes per session: Not on file  . Stress: Not on file  Relationships  . Social connections:    Talks on phone: Not on file    Gets together: Not on file    Attends religious service: Not on file    Active member of club or organization: Not on file    Attends meetings of clubs or organizations: Not on file    Relationship status: Not on file  Other Topics Concern  . 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 Lamisil [Terbinafine] Hives and Itching  . Terbinafine Hcl Hives   Family History  Problem Relation Age of Onset  . Cancer Mother        Breast Cancer  . Breast cancer Mother   . Hypertension Father   . Hyperlipidemia Father   . Other Brother        lyme  . Stroke Maternal Grandmother   . Alcohol abuse Paternal Grandmother   . Heart disease Paternal Grandfather     Current Outpatient Medications (Endocrine & Metabolic):  .  levothyroxine (SYNTHROID, LEVOTHROID) 50 MCG tablet, Take 1 tablet (50 mcg total) by mouth daily.  Current Outpatient Medications (Cardiovascular):  .  amLODipine (NORVASC) 5 MG tablet, Take 1 tablet (5 mg total) by mouth daily. .  hydrochlorothiazide (HYDRODIURIL) 25 MG tablet, Take 1 tablet (25 mg total) by mouth daily.  Current Outpatient Medications (Respiratory):  Marland Kitchen  Fexofenadine HCl (ALLEGRA PO), Take by mouth.  Current Outpatient Medications (Analgesics):  .  acetaminophen (TYLENOL) 650 MG CR tablet, Take 650 mg by mouth every 8 (eight) hours as needed  for pain. .  traMADol (ULTRAM) 50 MG tablet, Take 1 tablet (50 mg total) by mouth every 12 (twelve) hours as needed.  Current Outpatient Medications (Hematological):  Marland Kitchen  Ferrous Sulfate (IRON) 325 (65 Fe) MG TABS, Take by mouth daily.  Current Outpatient Medications (Other):  Marland Kitchen  Ascorbic Acid (VITAMIN C PO), Take by mouth daily. .  Cholecalciferol (VITAMIN D3) 2000 units TABS, Take 2,000 Units by mouth daily. .  DULoxetine (CYMBALTA) 60 MG capsule, Take 1 capsule (60 mg total) by mouth daily. .  famotidine (PEPCID) 20 MG tablet, Take 1 tablet (20 mg total) by mouth 2 (two) times daily. Marland Kitchen  gabapentin (NEURONTIN) 100 MG capsule, TAKE 2 CAPSULES BY MOUTH AT BEDTIME. Marland Kitchen  Misc Natural Products (CALCIUM PYRUVATE PO), Take by mouth. .  nitrofurantoin (MACRODANTIN) 50 MG capsule, TAKE AFTER INTERCOURSE .   TURMERIC PO, Take by mouth daily.    Past medical history, social, surgical and family history all reviewed in electronic medical record.  No pertanent information unless stated regarding to the chief complaint.   Review of Systems:  No headache, visual changes, nausea, vomiting, diarrhea, constipation, dizziness, abdominal pain, skin rash, fevers, chills, night sweats, weight loss, swollen lymph nodes, body aches, joint swelling,  chest pain, shortness of breath, mood changes.  Positive muscle aches  Objective  Blood pressure 110/82, pulse (!) 102, height 5' 3"  (1.6 m), weight 142 lb (64.4 kg), SpO2 99 %.     General: No apparent distress alert and oriented x3 mood and affect normal, dressed appropriately.  HEENT: Pupils equal, extraocular movements intact  Respiratory: Patient's speak in full sentences and does not appear short of breath  Cardiovascular: No lower extremity edema, non tender, no erythema  Skin: Warm dry intact with no signs of infection or rash on extremities or on axial skeleton.  Abdomen: Soft nontender  Neuro: Cranial nerves II through XII are intact, neurovascularly intact in all extremities with 2+ DTRs and 2+ pulses.  Lymph: No lymphadenopathy of posterior or anterior cervical chain or axillae bilaterally.  Gait normal with good balance and coordination.  MSK:  tender with limited range of motion and good stability and symmetric strength and tone of shoulders, elbows, wrist, hip, knee and ankles bilaterally.  Neck: Inspection mild lossoflordosis. No palpable stepoffs. Negative Spurling's maneuver. Mild limited range of motion in all planes of 5 to 10 degrees Grip strength and sensation normal in bilateral hands Strength good C4 to T1 distribution No sensory change to C4 to T1 Negative Hoffman sign bilaterally Reflexes normal Tightness in the trapezius bilaterally.  Back Exam:  Inspection: Unremarkable  Motion: Flexion 45 deg, Extension 35 deg, Side Bending  to 35 deg bilaterally, Rotation to 35 deg bilaterally  SLR laying: Negative  XSLR laying: Negative  Palpable tenderness: Tender to palpation of the paraspinal musculature.Marland Kitchen FABER: Tightness bilaterally. Sensory change: Gross sensation intact to all lumbar and sacral dermatomes.  Reflexes: 2+ at both patellar tendons, 2+ at achilles tendons, Babinski's downgoing.  Strength at foot  Plantar-flexion: 5/5 Dorsi-flexion: 5/5 Eversion: 5/5 Inversion: 5/5  Leg strength  Quad: 5/5 Hamstring: 5/5 Hip flexor: 5/5 Hip abductors: 5/5  Gait unremarkable.  Osteopathic findings C2 flexed rotated and side bent right C6 flexed rotated and side bent left T3 extended rotated and side bent right inhaled third rib T7 extended rotated and side bent left L2 flexed rotated and side bent right Sacrum right on right Pelvic shear noted   Impression and Recommendations:     This  case required medical decision making of moderate complexity. The above documentation has been reviewed and is accurate and complete Lyndal Pulley, DO       Note: This dictation was prepared with Dragon dictation along with smaller phrase technology. Any transcriptional errors that result from this process are unintentional.

## 2018-04-04 NOTE — Assessment & Plan Note (Signed)
Decision today to treat with OMT was based on Physical Exam  After verbal consent patient was treated with HVLA, ME, FPR techniques in cervical, thoracic, rib lumbar and sacral and pelvis areas  Patient tolerated the procedure well with improvement in symptoms  Patient given exercises, stretches and lifestyle modifications  See medications in patient instructions if given  Patient will follow up in 4-6 weeks

## 2018-04-04 NOTE — Patient Instructions (Addendum)
Good to see you  Ice is your friend at the end of the day  Start the cardio 5 minutes as well and add a minute a week.  Hold on too much weights  DHEA 64m-50 mg daily  See me again in 4-6 weeks

## 2018-04-04 NOTE — Assessment & Plan Note (Signed)
Discussed icing regimen and home exercise.  Which activities to do which wants to avoid.  Patient is doing fairly well though now with the different diet changes.  Has had work-up by rheumatology previously.  Seen in integrative physicians and seems to be making some improvement.  Follow-up again in 4 weeks

## 2018-04-06 DIAGNOSIS — M545 Low back pain: Secondary | ICD-10-CM | POA: Diagnosis not present

## 2018-04-13 DIAGNOSIS — E2749 Other adrenocortical insufficiency: Secondary | ICD-10-CM | POA: Insufficient documentation

## 2018-04-14 DIAGNOSIS — M545 Low back pain: Secondary | ICD-10-CM | POA: Diagnosis not present

## 2018-04-21 DIAGNOSIS — M545 Low back pain: Secondary | ICD-10-CM | POA: Diagnosis not present

## 2018-04-28 DIAGNOSIS — H5203 Hypermetropia, bilateral: Secondary | ICD-10-CM | POA: Diagnosis not present

## 2018-05-02 DIAGNOSIS — M545 Low back pain: Secondary | ICD-10-CM | POA: Diagnosis not present

## 2018-05-05 ENCOUNTER — Encounter: Payer: Self-pay | Admitting: Family Medicine

## 2018-05-05 MED ORDER — PREDNISONE 50 MG PO TABS: 50.0000 mg | ORAL_TABLET | Freq: Every day | ORAL | 0 refills | Status: DC

## 2018-05-05 MED FILL — predniSONE 50 MG TABS: 50 | 5 days supply | Qty: 5 | Fill #0

## 2018-05-08 NOTE — Progress Notes (Signed)
[name], [place] [address] Phone: [phone] Subjective:   I [name] am serving as a Education administrator for Dr. Hulan Saas.  CC: Back pain follow-up  [id number]  [name] is a 43 y.o. female coming in with complaint of back pain. States that she is getting better. Patient states that was on her diet and seem to be working.  Then came off the diet and increasing in inflammation.     Past Medical History:  Diagnosis Date  . Allergy   . Hypertension    Past Surgical History:  Procedure Laterality Date  . CHOLECYSTECTOMY    . TUBAL LIGATION     Social History   Socioeconomic History  . Marital status: Married    Spouse name: Not on file  . Number of children: Not on file  . Years of education: Not on file  . Highest education level: Not on file  Occupational History  . Not on file  Social Needs  . Financial resource strain: Not on file  . Food insecurity:    Worry: Not on file    Inability: Not on file  . Transportation needs:    Medical: Not on file    Non-medical: Not on file  Tobacco Use  . Smoking status: Never Smoker  . Smokeless tobacco: Never Used  Substance and Sexual Activity  . Alcohol use: Yes  . Drug use: No  . Sexual activity: Yes  Lifestyle  . Physical activity:    Days per week: Not on file    Minutes per session: Not on file  . Stress: Not on file  Relationships  . Social connections:    Talks on phone: Not on file    Gets together: Not on file    Attends religious service: Not on file    Active member of club or organization: Not on file    Attends meetings of clubs or organizations: Not on file    Relationship status: Not on file  Other Topics Concern  . 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 Lamisil [Terbinafine] Hives and Itching  . Terbinafine Hcl Hives   Family History  Problem  Relation Age of Onset  . Cancer Mother        Breast Cancer  . Breast cancer Mother   . Hypertension Father   . Hyperlipidemia Father   . Other Brother        lyme  . Stroke Maternal Grandmother   . Alcohol abuse Paternal Grandmother   . Heart disease Paternal Grandfather     Current Outpatient Medications (Endocrine & Metabolic):  .  levothyroxine (SYNTHROID, LEVOTHROID) 50 MCG tablet, Take 1 tablet (50 mcg total) by mouth daily. .  predniSONE (DELTASONE) 50 MG tablet, Take 1 tablet (50 mg total) by mouth daily.  Current Outpatient Medications (Cardiovascular):  .  amLODipine (NORVASC) 5 MG tablet, Take 1 tablet (5 mg total) by mouth daily. .  hydrochlorothiazide (HYDRODIURIL) 25 MG tablet, Take 1 tablet (25 mg total) by mouth daily.  Current Outpatient Medications (Respiratory):  Marland Kitchen  Fexofenadine HCl (ALLEGRA PO), Take by mouth.  Current Outpatient Medications (Analgesics):  .  acetaminophen (TYLENOL) 650 MG CR tablet, Take 650 mg by mouth every 8 (eight) hours as needed for pain. .  traMADol (ULTRAM) 50 MG tablet, Take 1 tablet (50 mg total) by  mouth every 12 (twelve) hours as needed.   Current Outpatient Medications (Other):  Marland Kitchen  Ascorbic Acid (VITAMIN C PO), Take by mouth daily. .  Cholecalciferol (VITAMIN D3) 2000 units TABS, Take 2,000 Units by mouth daily. .  DULoxetine (CYMBALTA) 60 MG capsule, Take 1 capsule (60 mg total) by mouth daily. .  famotidine (PEPCID) 20 MG tablet, Take 1 tablet (20 mg total) by mouth 2 (two) times daily. Marland Kitchen  gabapentin (NEURONTIN) 100 MG capsule, TAKE 2 CAPSULES BY MOUTH AT BEDTIME. Marland Kitchen  Misc Natural Products (CALCIUM PYRUVATE PO), Take by mouth. .  nitrofurantoin (MACRODANTIN) 50 MG capsule, TAKE AFTER INTERCOURSE .  Prasterone, DHEA, (DHEA ADVANCED FORMULA PO), Take 5 mg by mouth. .  TURMERIC PO, Take by mouth daily.    Past medical history, social, surgical and family history all reviewed in electronic medical record.  No pertanent  information unless stated regarding to the chief complaint.   Review of Systems:  No headache, visual changes, nausea, vomiting, diarrhea, constipation, dizziness, abdominal pain, skin rash, fevers, chills, night sweats, weight loss, swollen lymph nodes, body aches, joint swelling,chest pain, shortness of breath, mood changes.  Positive muscle aches  Objective  Blood pressure 116/70, pulse 80, height 5' 3"  (1.6 m), weight 139 lb (63 kg), SpO2 98 %.    General: No apparent distress alert and oriented x3 mood and affect normal, dressed appropriately.  HEENT: Pupils equal, extraocular movements intact  Respiratory: Patient's speak in full sentences and does not appear short of breath  Cardiovascular: No lower extremity edema, non tender, no erythema  Skin: Warm dry intact with no signs of infection or rash on extremities or on axial skeleton.  Abdomen: Soft nontender  Neuro: Cranial nerves II through XII are intact, neurovascularly intact in all extremities with 2+ DTRs and 2+ pulses.  Lymph: No lymphadenopathy of posterior or anterior cervical chain or axillae bilaterally.  Gait normal with good balance and coordination.  MSK:  Non tender with full range of motion and good stability and symmetric strength and tone of shoulders, elbows, wrist, hip, knee and ankles bilaterally.  Back Exam:  Inspection: Loss of lordosis Motion: Flexion 25 deg, Extension 15 deg, Side Bending to 35 deg bilaterally,  Rotation to 35 deg bilaterally  SLR laying: Negative  XSLR laying: Negative  Palpable tenderness: Tender to palpation.  Diffusely in the paraspinal musculature cervical, thoracic, lumbar. FABER: negative. Sensory change: Gross sensation intact to all lumbar and sacral dermatomes.  Reflexes: 2+ at both patellar tendons, 2+ at achilles tendons, Babinski's downgoing.  Strength at foot  Plantar-flexion: 5/5 Dorsi-flexion: 5/5 Eversion: 5/5 Inversion: 5/5  Leg strength  Quad: 5/5 Hamstring: 5/5 Hip  flexor: 5/5 Hip abductors: 5/5    Osteopathic findings  C2 flexed rotated and side bent right C7 flexed rotated and side bent left T3 extended rotated and side bent right inhaled third rib T9 extended rotated and side bent right L3 flexed rotated and side bent left Sacrum right on right Pelvic shear noted right    Impression and Recommendations:     This case required medical decision making of moderate complexity. The above documentation has been reviewed and is accurate and complete Lyndal Pulley, DO       Note: This dictation was prepared with Dragon dictation along with smaller phrase technology. Any transcriptional errors that result from this process are unintentional.

## 2018-05-09 ENCOUNTER — Encounter: Payer: Self-pay | Admitting: Family Medicine

## 2018-05-09 ENCOUNTER — Ambulatory Visit: Payer: 59 | Admitting: Family Medicine

## 2018-05-09 VITALS — BP 116/70 | HR 80 | Ht 63.0 in | Wt 139.0 lb

## 2018-05-09 DIAGNOSIS — M999 Biomechanical lesion, unspecified: Secondary | ICD-10-CM | POA: Diagnosis not present

## 2018-05-09 DIAGNOSIS — M533 Sacrococcygeal disorders, not elsewhere classified: Secondary | ICD-10-CM

## 2018-05-09 NOTE — Patient Instructions (Signed)
Good to see you  You are doing great  Stay active Get abck on the diet  See me again in 4 weeks or after your B-Day!

## 2018-05-09 NOTE — Assessment & Plan Note (Signed)
Decision today to treat with OMT was based on Physical Exam  After verbal consent patient was treated with HVLA, ME, FPR techniques in cervical, thoracic, rib lumbar and sacral areas  Patient tolerated the procedure well with improvement in symptoms  Patient given exercises, stretches and lifestyle modifications  See medications in patient instructions if given  Patient will follow up in 4-8 weeks

## 2018-05-09 NOTE — Assessment & Plan Note (Signed)
Discussed posture and ergonomics.  Discussed which activities to do which wants to avoid.  Discussed posture and ergonomics.  Discussed hip abductor strengthening.  Encourage patient to continue with the weight loss.  Follow-up again in 4 to 8 weeks

## 2018-05-12 DIAGNOSIS — M545 Low back pain: Secondary | ICD-10-CM | POA: Diagnosis not present

## 2018-05-18 DIAGNOSIS — M545 Low back pain: Secondary | ICD-10-CM | POA: Diagnosis not present

## 2018-05-18 MED FILL — HYDROCHLOROTHIAZIDE 25 MG T: 25 | 90 days supply | Qty: 90 | Fill #3

## 2018-05-18 MED FILL — GABAPENTIN 100 MG CAPSULE: 100 | 90 days supply | Qty: 180 | Fill #1

## 2018-05-18 MED FILL — AMLODIPINE BESYLATE 5 MG TA: 5 | 90 days supply | Qty: 90 | Fill #3

## 2018-06-06 ENCOUNTER — Ambulatory Visit: Payer: 59 | Admitting: Family Medicine

## 2018-06-09 ENCOUNTER — Other Ambulatory Visit: Payer: Self-pay | Admitting: Internal Medicine

## 2018-06-20 ENCOUNTER — Telehealth: Payer: 59 | Admitting: Physician Assistant

## 2018-06-20 DIAGNOSIS — N3000 Acute cystitis without hematuria: Secondary | ICD-10-CM

## 2018-06-20 MED ORDER — NITROFURANTOIN MONOHYD MACRO 100 MG PO CAPS: 100.0000 mg | ORAL_CAPSULE | Freq: Two times a day (BID) | ORAL | 0 refills | Status: DC

## 2018-06-20 MED FILL — NITROFURANTOIN MONO-MCR 100: 100 | 5 days supply | Qty: 10 | Fill #0

## 2018-06-20 MED FILL — NITROFURANTOIN MACROCRYSTAL: 50 | 45 days supply | Qty: 45 | Fill #0

## 2018-06-20 NOTE — Progress Notes (Signed)
We are sorry that you are not feeling well.  Here is how we plan to help!  Based on what you shared with me it looks like you most likely have a simple urinary tract infection.  A UTI (Urinary Tract Infection) is a bacterial infection of the bladder.  Most cases of urinary tract infections are simple to treat but a key part of your care is to encourage you to drink plenty of fluids and watch your symptoms carefully.  I have prescribed MacroBid 100 mg twice a day for 5 days.  Your symptoms should gradually improve. Call us if the burning in your urine worsens, you develop worsening fever, back pain or pelvic pain or if your symptoms do not resolve after completing the antibiotic.  Urinary tract infections can be prevented by drinking plenty of water to keep your body hydrated.  Also be sure when you wipe, wipe from front to back and don't hold it in!  If possible, empty your bladder every 4 hours.  Your e-visit answers were reviewed by a board certified advanced clinical practitioner to complete your personal care plan.  Depending on the condition, your plan could have included both over the counter or prescription medications.  If there is a problem please reply  once you have received a response from your provider.  Your safety is important to [place].  If you have drug allergies check your prescription carefully.    You can use MyChart to ask questions about today's visit, request a non-urgent call back, or ask for a work or school excuse for 24 hours related to this e-Visit. If it has been greater than 24 hours you will need to follow up with your provider, or enter a new e-Visit to address those concerns.   You will get an e-mail in the next two days asking about your experience.  I hope that your e-visit has been valuable and will speed your recovery. Thank you for using e-visits.

## 2018-06-20 NOTE — Addendum Note (Signed)
Addended by: Dorise Hiss on: 06/20/2018 11:12 AM   Modules accepted: Orders

## 2018-06-25 ENCOUNTER — Other Ambulatory Visit: Payer: Self-pay | Admitting: Family Medicine

## 2018-06-25 MED FILL — LEVOTHYROXINE 50 MCG TABLET: 50 | 90 days supply | Qty: 90 | Fill #0

## 2018-06-27 MED FILL — DULOXETINE HCL 60 MG CPEP: 60 | 90 days supply | Qty: 90 | Fill #0

## 2018-07-01 ENCOUNTER — Telehealth: Payer: 59 | Admitting: Nurse Practitioner

## 2018-07-01 DIAGNOSIS — A499 Bacterial infection, unspecified: Secondary | ICD-10-CM

## 2018-07-01 DIAGNOSIS — N390 Urinary tract infection, site not specified: Principal | ICD-10-CM

## 2018-07-01 NOTE — Progress Notes (Signed)
Based on what you shared with me it looks like you have a recurrent or resistant UTI,that should be evaluated in a face to face office visit. You need a urinalysis and urine culture.   NOTE: If you entered your credit card information for this eVisit, you will not be charged. You may see a "hold" on your card for the $30 but that hold will drop off and you will not have a charge processed.  If you are having a true medical emergency please call 911.  If you need an urgent face to face visit, [facility] has four urgent care centers for your convenience.  If you need care fast and have a high deductible or no insurance consider:   [email or web address] to reserve your spot online an avoid wait times  [facility] [address], [address] [place], [place] [address] 8 am to 8 pm Monday-Friday 10 am to 4 pm Saturday-Sunday *Across the street from [employer]  [place], [address] 8 am to 5 pm Monday-Friday * In the [facility] on the [facility]   The following sites will take your  insurance:  . [facility] [place] a Provider at this Location  [address] [place], [place] [address] . 10 am to 8 pm Monday-Friday . 12 pm to 8 pm Saturday-Sunday   . [facility] [place] a Provider at this Location  [address], [place], [place] [address] . 8 am to 8 pm Monday-Friday . 9 am to 6 pm Saturday . 11 am to 6 pm Sunday   . [facility] at [place] Get Driving Directions  [address] [place], [place] [address] . 8 am to 8 pm Monday-Friday . 8 am to 4 pm Saturday-Sunday   Your e-visit answers were reviewed by a board certified advanced clinical practitioner to complete your personal care plan.  5-10 minutes spent reviewing  and documenting in chart.

## 2018-07-02 ENCOUNTER — Encounter: Payer: Self-pay | Admitting: Emergency Medicine

## 2018-07-02 ENCOUNTER — Other Ambulatory Visit: Payer: Self-pay

## 2018-07-02 ENCOUNTER — Emergency Department
Admission: EM | Admit: 2018-07-02 | Discharge: 2018-07-02 | Disposition: A | Discharge status: Home / Self Care | Payer: 59 | Source: Home / Self Care | Attending: Family Medicine | Admitting: Family Medicine

## 2018-07-02 DIAGNOSIS — N3090 Cystitis, unspecified without hematuria: Secondary | ICD-10-CM

## 2018-07-02 LAB — POCT URINALYSIS DIP (MANUAL ENTRY)
Bilirubin, UA: NEGATIVE
Glucose, UA: NEGATIVE mg/dL
Ketones, POC UA: NEGATIVE mg/dL
Nitrite, UA: POSITIVE — AB
Protein Ur, POC: NEGATIVE mg/dL
Spec Grav, UA: 1.02 (ref 1.010–1.025)
Urobilinogen, UA: 0.2 E.U./dL
pH, UA: 6.5 (ref 5.0–8.0)

## 2018-07-02 MED ORDER — CIPROFLOXACIN HCL 500 MG PO TABS: 500.0000 mg | ORAL_TABLET | Freq: Two times a day (BID) | ORAL | 0 refills | Status: DC

## 2018-07-02 NOTE — ED Triage Notes (Signed)
Patient has recurrent UTIs; was treated for one 2 weeks ago with macrobid; symptoms eased until 3 days ago; was told she needed in person evaluation for UA and UC; takes macrodantin post coital; last evening took OTC/AZO. Patient exposed to patient with COVID last week; prolonged exposure with PPE in place.

## 2018-07-02 NOTE — Discharge Instructions (Addendum)
Increase fluid intake. May use non-prescription AZO for about two days, if desired, to decrease urinary discomfort.  If symptoms become significantly worse during the night or over the weekend, proceed to the local emergency room.

## 2018-07-02 NOTE — ED Provider Notes (Signed)
[facility]    CSN: [id number] Arrival date & time: 07/02/18  1356     History   Chief Complaint Chief Complaint  Patient presents with  . Dysuria    HPI [name] is a 43 y.o. female.   Patient has a history of recurrent UTI's, having been treated 2 weeks ago with Macrobid.  Her symptoms improved initially but have now recurred.  During the past 4 to 5 days she has had dysuria, frequency and urgency.  She denies fevers, chills, and sweats. She normally takes macrodantin post-coital.  The history is provided by the patient.  Dysuria  Pain quality:  Burning Pain severity:  Mild Onset quality:  Sudden Duration:  3 days Timing:  Constant Progression:  Worsening Chronicity:  Recurrent Recent urinary tract infections: yes   Relieved by:  Phenazopyridine Worsened by:  Nothing Urinary symptoms: frequent urination and hesitancy   Urinary symptoms: no discolored urine, no foul-smelling urine and no hematuria   Associated symptoms: no abdominal pain, no fever, no flank pain, no genital lesions, no nausea, no vaginal discharge and no vomiting   Risk factors: recurrent urinary tract infections and sexually active     Past Medical History:  Diagnosis Date  . Allergy   . Hypertension     Patient Active Problem List   Diagnosis Date Noted  . Nonallopathic lesion of rib cage 10/08/2017  . Hyperglycemia 07/27/2017  . Antalgic gait 04/19/2017  . Polyarthralgia 02/26/2016  . GERD (gastroesophageal reflux disease) 02/17/2016  . Cervical lymphadenopathy 01/21/2016  . Quadriceps muscle strain 10/14/2015  . Cough 09/14/2015  . Fullness of neck 09/14/2015  . Nonallopathic lesion of thoracic region 08/23/2015  . Lumbar radiculopathy 08/02/2015  . 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 Scapular dysfunction 03/20/2015  . 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 Nonallopathic lesion of lumbosacral region  06/29/2014  . Nonallopathic lesion of sacral region 06/29/2014  . Nonallopathic lesion of pelvic region 06/29/2014  . Recurrent UTI 05/18/2013  . Back pain, acute 10/07/2010  . Allergic rhinitis 08/14/2010  . Essential hypertension, benign 01/22/2010    Past Surgical History:  Procedure Laterality Date  . CHOLECYSTECTOMY    . TUBAL LIGATION      OB History   No obstetric history on file.      Home Medications    Prior to Admission medications   Medication Sig Start Date End Date Taking? Authorizing Provider  acetaminophen (TYLENOL) 650 MG CR tablet Take 650 mg by mouth every 8 (eight) hours as needed for pain.    [provider]  amLODipine (NORVASC) 5 MG tablet Take 1 tablet (5 mg total) by mouth daily. 07/27/17   Binnie Rail, MD  Ascorbic Acid (VITAMIN C PO) Take by mouth daily.    [provider]  Cholecalciferol (VITAMIN D3) 2000 units TABS Take 2,000 Units by mouth daily.    [provider]  ciprofloxacin (CIPRO) 500 MG tablet Take 1 tablet (500 mg total) by mouth 2 (two) times daily. 07/02/18   Kandra Nicolas, MD  DULoxetine (CYMBALTA) 60 MG capsule TAKE 1 CAPSULE BY MOUTH DAILY. 06/27/18   Lyndal Pulley, DO  famotidine (PEPCID) 20 MG tablet Take 1 tablet (20 mg total) by mouth 2 (two) times daily. 02/07/18   Binnie Rail, MD  Fexofenadine HCl (ALLEGRA PO) Take by mouth.    [provider]  gabapentin (NEURONTIN) 100 MG capsule TAKE 2 CAPSULES  BY MOUTH AT BEDTIME. 12/22/17   Lyndal Pulley, DO  hydrochlorothiazide (HYDRODIURIL) 25 MG tablet Take 1 tablet (25 mg total) by mouth daily. 07/27/17   Binnie Rail, MD  levothyroxine (SYNTHROID, LEVOTHROID) 50 MCG tablet Take 1 tablet (50 mcg total) by mouth daily. 10/08/17   Lyndal Pulley, DO  Misc Natural Products (CALCIUM PYRUVATE PO) Take by mouth.    [provider]  nitrofurantoin (MACRODANTIN) 50 MG capsule TAKE AFTER INTERCOURSE 06/10/18   Binnie Rail, MD  Prasterone,  DHEA, (DHEA ADVANCED FORMULA PO) Take 5 mg by mouth.    [provider]  predniSONE (DELTASONE) 50 MG tablet Take 1 tablet (50 mg total) by mouth daily. 05/05/18   Lyndal Pulley, DO  traMADol (ULTRAM) 50 MG tablet Take 1 tablet (50 mg total) by mouth every 12 (twelve) hours as needed. 12/04/17   Binnie Rail, MD  TURMERIC PO Take by mouth daily.    [provider]    Family History Family History  Problem Relation Age of Onset  . Cancer Mother        Breast Cancer  . Breast cancer Mother   . Hypertension Father   . Hyperlipidemia Father   . Other Brother        lyme  . Stroke Maternal Grandmother   . Alcohol abuse Paternal Grandmother   . Heart disease Paternal Grandfather     Social History Social History   Tobacco Use  . Smoking status: Never Smoker  . Smokeless tobacco: Never Used  Substance Use Topics  . Alcohol use: Yes  . Drug use: No     Allergies   Lamisil [terbinafine] and Terbinafine hcl   Review of Systems Review of Systems  Constitutional: Negative for fever.  Gastrointestinal: Negative for abdominal pain, nausea and vomiting.  Genitourinary: Positive for dysuria. Negative for flank pain and vaginal discharge.     Physical Exam Triage Vital Signs ED Triage Vitals [07/02/18 1454]  Enc Vitals Group     BP 104/71     Pulse Rate 89     Resp 16     Temp 98.3 F (36.8 C)     Temp Source Oral     SpO2 99 %     Weight 140 lb (63.5 kg)     Height 5' 3"  (1.6 m)     Head Circumference      Peak Flow      Pain Score 2     Pain Loc      Pain Edu?      Excl. in [place]?    No data found.  Updated Vital Signs BP 104/71 (BP Location: Right Arm)   Pulse 89   Temp 98.3 F (36.8 C) (Oral)   Resp 16   Ht 5' 3"  (1.6 m)   Wt 63.5 kg   SpO2 99%   BMI 24.80 kg/m   Visual Acuity Right Eye Distance:   Left Eye Distance:   Bilateral Distance:    Right Eye Near:   Left Eye Near:    Bilateral Near:     Physical Exam Nursing notes  and Vital Signs reviewed. Appearance:  Patient appears stated age, and in no acute distress.    Eyes:  Pupils are equal, round, and reactive to light and accomodation.  Extraocular movement is intact.  Conjunctivae are not inflamed   Pharynx:  Normal; moist mucous membranes  Neck:  Supple.  No adenopathy Lungs:  Clear to auscultation.  Breath sounds are equal.  Moving air well. Heart:  Regular rate and rhythm without murmurs, rubs, or gallops.  Abdomen:  Nontender without masses or hepatosplenomegaly.  Bowel sounds are present.  No CVA or flank tenderness.  Extremities:  No edema.  Skin:  No rash present.     UC Treatments / Results  Labs (all labs ordered are listed, but only abnormal results are displayed) Labs Reviewed  POCT URINALYSIS DIP (MANUAL ENTRY) - Abnormal; Notable for the following components:      Result Value   Color, UA other (*)    Clarity, UA cloudy (*)    Blood, UA trace-intact (*)    Nitrite, UA Positive (*)    Leukocytes, UA Small (1+) (*)    All other components within normal limits  URINE CULTURE    EKG None  Radiology No results found.  Procedures Procedures (including critical care time)  Medications Ordered in UC Medications - No data to display  Initial Impression / Assessment and Plan / UC Course  I have reviewed the triage vital signs and the nursing notes.  Pertinent labs & imaging results that were available during my care of the patient were reviewed by me and considered in my medical decision making (see chart for details).    Urine culture pending. Begin Cipro 546m BID for 5 days.   Followup with Family Doctor if not improved in 5 days   Final Clinical Impressions(s) / UC Diagnoses   Final diagnoses:  Cystitis     Discharge Instructions     Increase fluid intake. May use non-prescription AZO for about two days, if desired, to decrease urinary discomfort.  If symptoms become significantly worse during the night or over the  weekend, proceed to the local emergency room.     ED Prescriptions    Medication Sig Dispense Auth. Provider   ciprofloxacin (CIPRO) 500 MG tablet Take 1 tablet (500 mg total) by mouth 2 (two) times daily. 10 tablet BKandra Nicolas MD         BKandra Nicolas MD 07/03/18 [id number]

## 2018-07-04 ENCOUNTER — Telehealth: Payer: Self-pay

## 2018-07-04 LAB — URINE CULTURE
MICRO NUMBER:: 422933
SPECIMEN QUALITY:: ADEQUATE

## 2018-07-04 MED ORDER — SULFAMETHOXAZOLE-TRIMETHOPRIM 800-160 MG PO TABS: 1.0000 | ORAL_TABLET | Freq: Two times a day (BID) | ORAL | 0 refills | Status: AC

## 2018-07-04 NOTE — Telephone Encounter (Signed)
Notified patient that her UCX results are positive for UTI.  Script changed per Dr Burnett Harry and sent to [facility] in [place]

## 2018-07-04 NOTE — Telephone Encounter (Signed)
Was seen here urgent care 07/02/2018 for UTI.  Was prescribed Cipro by Dr. Assunta Found.  Urine culture just came back showing greater than 100,000 E. coli, resistant to Cipro. Sensitive to Septra and some other antibiotics. I decided to stop the Cipro and changed to Septra twice daily x7 days. (Patient has voiced at the 4/25 visit that there is no chance of pregnancy.) Santiago Glad, RN here advised patient that we will be changing antibiotic as above.  sending prescription for Septra to her pharmacy now. Follow-up with PCP if no better 5 to 7 days, or sooner as needed

## 2018-08-01 NOTE — Patient Instructions (Addendum)
Tests ordered today. Your results will be released to MyChart (or called to you) after review, usually within 72hours after test completion. If any changes need to be made, you will be notified at that same time.  All other Health Maintenance issues reviewed.   All recommended immunizations and age-appropriate screenings are up-to-date or discussed.  No immunizations administered today.   Medications reviewed and updated.  Changes include :   none  Your prescription(s) have been submitted to your pharmacy. Please take as directed and contact our office if you believe you are having problem(s) with the medication(s).   Please followup in 6 months   Health Maintenance, Female Adopting a healthy lifestyle and getting preventive care can go a long way to promote health and wellness. Talk with your health care provider about what schedule of regular examinations is right for you. This is a good chance for you to check in with your provider about disease prevention and staying healthy. In between checkups, there are plenty of things you can do on your own. Experts have done a lot of research about which lifestyle changes and preventive measures are most likely to keep you healthy. Ask your health care provider for more information. Weight and diet Eat a healthy diet  Be sure to include plenty of vegetables, fruits, low-fat dairy products, and lean protein.  Do not eat a lot of foods high in solid fats, added sugars, or salt.  Get regular exercise. This is one of the most important things you can do for your health. ? Most adults should exercise for at least 150 minutes each week. The exercise should increase your heart rate and make you sweat (moderate-intensity exercise). ? Most adults should also do strengthening exercises at least twice a week. This is in addition to the moderate-intensity exercise. Maintain a healthy weight  Body mass index (BMI) is a measurement that can be used to  identify possible weight problems. It estimates body fat based on height and weight. Your health care provider can help determine your BMI and help you achieve or maintain a healthy weight.  For females 20 years of age and older: ? A BMI below 18.5 is considered underweight. ? A BMI of 18.5 to 24.9 is normal. ? A BMI of 25 to 29.9 is considered overweight. ? A BMI of 30 and above is considered obese. Watch levels of cholesterol and blood lipids  You should start having your blood tested for lipids and cholesterol at 45 years of age, then have this test every 5 years.  You may need to have your cholesterol levels checked more often if: ? Your lipid or cholesterol levels are high. ? You are older than 45 years of age. ? You are at high risk for heart disease. Cancer screening Lung Cancer  Lung cancer screening is recommended for adults 55-80 years old who are at high risk for lung cancer because of a history of smoking.  A yearly low-dose CT scan of the lungs is recommended for people who: ? Currently smoke. ? Have quit within the past 15 years. ? Have at least a 30-pack-year history of smoking. A pack year is smoking an average of one pack of cigarettes a day for 1 year.  Yearly screening should continue until it has been 15 years since you quit.  Yearly screening should stop if you develop a health problem that would prevent you from having lung cancer treatment. Breast Cancer  Practice breast self-awareness. This means understanding how   how your breasts normally appear and feel.  It also means doing regular breast self-exams. Let your health care provider know about any changes, no matter how small.  If you are in your 20s or 30s, you should have a clinical breast exam (CBE) by a health care provider every 1-3 years as part of a regular health exam.  If you are 80 or older, have a CBE every year. Also consider having a breast X-ray (mammogram) every year.  If you have a family  history of breast cancer, talk to your health care provider about genetic screening.  If you are at high risk for breast cancer, talk to your health care provider about having an MRI and a mammogram every year.  Breast cancer gene (BRCA) assessment is recommended for women who have family members with BRCA-related cancers. BRCA-related cancers include: ? Breast. ? Ovarian. ? Tubal. ? Peritoneal cancers.  Results of the assessment will determine the need for genetic counseling and BRCA1 and BRCA2 testing. Cervical Cancer Your health care provider may recommend that you be screened regularly for cancer of the pelvic organs (ovaries, uterus, and vagina). This screening involves a pelvic examination, including checking for microscopic changes to the surface of your cervix (Pap test). You may be encouraged to have this screening done every 3 years, beginning at age 69.  For women ages 72-65, health care providers may recommend pelvic exams and Pap testing every 3 years, or they may recommend the Pap and pelvic exam, combined with testing for human papilloma virus (HPV), every 5 years. Some types of HPV increase your risk of cervical cancer. Testing for HPV may also be done on women of any age with unclear Pap test results.  Other health care providers may not recommend any screening for nonpregnant women who are considered low risk for pelvic cancer and who do not have symptoms. Ask your health care provider if a screening pelvic exam is right for you.  If you have had past treatment for cervical cancer or a condition that could lead to cancer, you need Pap tests and screening for cancer for at least 20 years after your treatment. If Pap tests have been discontinued, your risk factors (such as having a new sexual partner) need to be reassessed to determine if screening should resume. Some women have medical problems that increase the chance of getting cervical cancer. In these cases, your health care  provider may recommend more frequent screening and Pap tests. Colorectal Cancer  This type of cancer can be detected and often prevented.  Routine colorectal cancer screening usually begins at 45 years of age and continues through 45 years of age.  Your health care provider may recommend screening at an earlier age if you have risk factors for colon cancer.  Your health care provider may also recommend using home test kits to check for hidden blood in the stool.  A small camera at the end of a tube can be used to examine your colon directly (sigmoidoscopy or colonoscopy). This is done to check for the earliest forms of colorectal cancer.  Routine screening usually begins at age 46.  Direct examination of the colon should be repeated every 5-10 years through 45 years of age. However, you may need to be screened more often if early forms of precancerous polyps or small growths are found. Skin Cancer  Check your skin from head to toe regularly.  Tell your health care provider about any new moles or changes in moles,  if there is a change in a mole's shape or color.  Also tell your health care provider if you have a mole that is larger than the size of a pencil eraser.  Always use sunscreen. Apply sunscreen liberally and repeatedly throughout the day.  Protect yourself by wearing long sleeves, pants, a wide-brimmed hat, and sunglasses whenever you are outside. Heart disease, diabetes, and high blood pressure  High blood pressure causes heart disease and increases the risk of stroke. High blood pressure is more likely to develop in: ? People who have blood pressure in the high end of the normal range (130-139/85-89 mm Hg). ? People who are overweight or obese. ? People who are African American.  If you are 18-39 years of age, have your blood pressure checked every 3-5 years. If you are 40 years of age or older, have your blood pressure checked every year. You should have your  blood pressure measured twice-once when you are at a hospital or clinic, and once when you are not at a hospital or clinic. Record the average of the two measurements. To check your blood pressure when you are not at a hospital or clinic, you can use: ? An automated blood pressure machine at a pharmacy. ? A home blood pressure monitor.  If you are between 55 years and 79 years old, ask your health care provider if you should take aspirin to prevent strokes.  Have regular diabetes screenings. This involves taking a blood sample to check your fasting blood sugar level. ? If you are at a normal weight and have a low risk for diabetes, have this test once every three years after 45 years of age. ? If you are overweight and have a high risk for diabetes, consider being tested at a younger age or more often. Preventing infection Hepatitis B  If you have a higher risk for hepatitis B, you should be screened for this virus. You are considered at high risk for hepatitis B if: ? You were born in a country where hepatitis B is common. Ask your health care provider which countries are considered high risk. ? Your parents were born in a high-risk country, and you have not been immunized against hepatitis B (hepatitis B vaccine). ? You have HIV or AIDS. ? You use needles to inject street drugs. ? You live with someone who has hepatitis B. ? You have had sex with someone who has hepatitis B. ? You get hemodialysis treatment. ? You take certain medicines for conditions, including cancer, organ transplantation, and autoimmune conditions. Hepatitis C  Blood testing is recommended for: ? Everyone born from 1945 through 1965. ? Anyone with known risk factors for hepatitis C. Sexually transmitted infections (STIs)  You should be screened for sexually transmitted infections (STIs) including gonorrhea and chlamydia if: ? You are sexually active and are younger than 45 years of age. ? You are older than 45  years of age and your health care provider tells you that you are at risk for this type of infection. ? Your sexual activity has changed since you were last screened and you are at an increased risk for chlamydia or gonorrhea. Ask your health care provider if you are at risk.  If you do not have HIV, but are at risk, it may be recommended that you take a prescription medicine daily to prevent HIV infection. This is called pre-exposure prophylaxis (PrEP). You are considered at risk if: ? You are sexually active and do not   not regularly use condoms or know the HIV status of your partner(s). ? You take drugs by injection. ? You are sexually active with a partner who has HIV. Talk with your health care provider about whether you are at high risk of being infected with HIV. If you choose to begin PrEP, you should first be tested for HIV. You should then be tested every 3 months for as long as you are taking PrEP. Pregnancy  If you are premenopausal and you may become pregnant, ask your health care provider about preconception counseling.  If you may become pregnant, take 400 to 800 micrograms (mcg) of folic acid every day.  If you want to prevent pregnancy, talk to your health care provider about birth control (contraception). Osteoporosis and menopause  Osteoporosis is a disease in which the bones lose minerals and strength with aging. This can result in serious bone fractures. Your risk for osteoporosis can be identified using a bone density scan.  If you are 55 years of age or older, or if you are at risk for osteoporosis and fractures, ask your health care provider if you should be screened.  Ask your health care provider whether you should take a calcium or vitamin D supplement to lower your risk for osteoporosis.  Menopause may have certain physical symptoms and risks.  Hormone replacement therapy may reduce some of these symptoms and risks. Talk to your health care provider about whether  hormone replacement therapy is right for you. Follow these instructions at home:  Schedule regular health, dental, and eye exams.  Stay current with your immunizations.  Do not use any tobacco products including cigarettes, chewing tobacco, or electronic cigarettes.  If you are pregnant, do not drink alcohol.  If you are breastfeeding, limit how much and how often you drink alcohol.  Limit alcohol intake to no more than 1 drink per day for nonpregnant women. One drink equals 12 ounces of beer, 5 ounces of wine, or 1 ounces of hard liquor.  Do not use street drugs.  Do not share needles.  Ask your health care provider for help if you need support or information about quitting drugs.  Tell your health care provider if you often feel depressed.  Tell your health care provider if you have ever been abused or do not feel safe at home. This information is not intended to replace advice given to you by your health care provider. Make sure you discuss any questions you have with your health care provider. Document Released: 09/08/2010 Document Revised: 08/01/2015 Document Reviewed: 11/27/2014 Elsevier Interactive Patient Education  2019 [employer].

## 2018-08-01 NOTE — Progress Notes (Signed)
Subjective:    Patient ID: [name], female    DOB: 1975-12-14, 43 y.o.   MRN: [id number]  HPI She is here for a physical exam.   She takes tramadol at most 1/week - typically probably three times a week.  She takes it for her joint pain - typically when it is in multiple joints and she can not tolerate it - can not concentrate it.   She has daily pain.    Medications and allergies reviewed with patient and updated if appropriate.  Patient Active Problem List   Diagnosis Date Noted  . Nonallopathic lesion of rib cage 10/08/2017  . Hyperglycemia 07/27/2017  . Antalgic gait 04/19/2017  . Polyarthralgia 02/26/2016  . GERD (gastroesophageal reflux disease) 02/17/2016  . Cervical lymphadenopathy 01/21/2016  . Quadriceps muscle strain 10/14/2015  . Cough 09/14/2015  . Nonallopathic lesion of thoracic region 08/23/2015  . Lumbar radiculopathy 08/02/2015  . 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 Scapular dysfunction 03/20/2015  . 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 Nonallopathic lesion of lumbosacral region 06/29/2014  . Nonallopathic lesion of sacral region 06/29/2014  . Nonallopathic lesion of pelvic region 06/29/2014  . Recurrent UTI 05/18/2013  . Back pain, acute 10/07/2010  . Allergic rhinitis 08/14/2010  . Essential hypertension, benign 01/22/2010    Current Outpatient Medications on File Prior to Visit  Medication Sig Dispense Refill  . acetaminophen (TYLENOL) 650 MG CR tablet Take 650 mg by mouth every 8 (eight) hours as needed for pain.    Marland Kitchen amLODipine (NORVASC) 5 MG tablet Take 1 tablet (5 mg total) by mouth daily. 90 tablet 3  . Cholecalciferol (VITAMIN D3) 2000 units TABS Take 2,000 Units by mouth daily.    . DULoxetine (CYMBALTA) 60 MG capsule TAKE 1 CAPSULE BY MOUTH DAILY. 90 capsule 3  . Fexofenadine HCl (ALLEGRA PO) Take by mouth.    . gabapentin (NEURONTIN) 100 MG capsule TAKE 2 CAPSULES BY  MOUTH AT BEDTIME. 180 capsule 1  . hydrochlorothiazide (HYDRODIURIL) 25 MG tablet Take 1 tablet (25 mg total) by mouth daily. 90 tablet 3  . levothyroxine (SYNTHROID, LEVOTHROID) 50 MCG tablet Take 1 tablet (50 mcg total) by mouth daily. 90 tablet 3  . Misc Natural Products (CALCIUM PYRUVATE PO) Take by mouth.    . nitrofurantoin (MACRODANTIN) 50 MG capsule TAKE AFTER INTERCOURSE 45 capsule 0  . Prasterone, DHEA, (DHEA ADVANCED FORMULA PO) Take 5 mg by mouth.    . traMADol (ULTRAM) 50 MG tablet Take 1 tablet (50 mg total) by mouth every 12 (twelve) hours as needed. 30 tablet 0   No current facility-administered medications on file prior to visit.     Past Medical History:  Diagnosis Date  . Allergy   . Hypertension     Past Surgical History:  Procedure Laterality Date  . CHOLECYSTECTOMY    . TUBAL LIGATION      Social History   Socioeconomic History  . Marital status: Married    Spouse name: Not on file  . Number of children: Not on file  . Years of education: Not on file  . Highest education level: Not on file  Occupational History  . Not on file  Social Needs  . Financial resource strain: Not on file  . Food insecurity:    Worry: Not on file    Inability: Not on file  . Transportation needs:    Medical: Not on file  Non-medical: Not on file  Tobacco Use  . Smoking status: Never Smoker  . Smokeless tobacco: Never Used  Substance and Sexual Activity  . Alcohol use: Yes  . Drug use: No  . Sexual activity: Yes  Lifestyle  . Physical activity:    Days per week: Not on file    Minutes per session: Not on file  . Stress: Not on file  Relationships  . Social connections:    Talks on phone: Not on file    Gets together: Not on file    Attends religious service: Not on file    Active member of club or organization: Not on file    Attends meetings of clubs or organizations: Not on file    Relationship status: Not on file  Other Topics Concern  . 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 Cancer Mother        Breast Cancer  . Breast cancer Mother   . Hypertension Father   . Hyperlipidemia Father   . Other Brother        lyme  . Stroke Maternal Grandmother   . Alcohol abuse Paternal Grandmother   . Heart disease Paternal Grandfather     Review of Systems  Constitutional: Negative for chills and fever.  Eyes: Negative for visual disturbance.  Respiratory: Positive for cough. Negative for shortness of breath and wheezing.   Cardiovascular: Negative for chest pain, palpitations and leg swelling.  Gastrointestinal: Negative for abdominal pain, blood in stool, constipation, diarrhea and nausea.       GERD - taking otc omeprazole  Genitourinary: Negative for dysuria and hematuria.  Musculoskeletal: Positive for arthralgias and back pain.  Skin: Negative for color change and rash.  Neurological: Negative for light-headedness and headaches.  Psychiatric/Behavioral: Negative for dysphoric mood. The patient is nervous/anxious (manageable).        Objective:   Vitals:   08/02/18 1323  BP: 118/72  Pulse: 95  Resp: 16  Temp: 98.6 F (37 C)  SpO2: 99%   Filed Weights   08/02/18 1323  Weight: 137 lb 1.9 oz (62.2 kg)   Body mass index is 24.29 kg/m.  BP Readings from Last 3 Encounters:  08/02/18 118/72  07/02/18 104/71  05/09/18 116/70    Wt Readings from Last 3 Encounters:  08/02/18 137 lb 1.9 oz (62.2 kg)  07/02/18 140 lb (63.5 kg)  05/09/18 139 lb (63 kg)     Physical Exam Constitutional: She appears well-developed and well-nourished. No distress.  HENT:  Head: Normocephalic and atraumatic.  Right Ear: External ear normal. Normal ear canal and TM Left Ear: External ear normal.  Normal ear canal and TM Mouth/Throat: Oropharynx is clear and moist.  Eyes: Conjunctivae and EOM are normal.  Neck: Neck supple. No tracheal  deviation present. No thyromegaly present.  No carotid bruit  Cardiovascular: Normal rate, regular rhythm and normal heart sounds.   No murmur heard.  No edema. Pulmonary/Chest: Effort normal and breath sounds normal. No respiratory distress. She has no wheezes. She has no rales.  Breast: deferred   Abdominal: Soft. She exhibits no distension. There is no tenderness.  Lymphadenopathy: She has cervical adenopathy that is chronic and stable.  Skin: Skin is warm and dry. She is not diaphoretic.  Psychiatric: She has a normal mood and affect. Her behavior is normal.  Assessment & Plan:   Physical exam: Screening blood work ordered Immunizations  Up to date  Mammogram  Up to date  Gyn  Up to date  - Dr Benjie Karvonen Eye exams   Up to date  Exercise  Active, does some exercise Weight  Normal BMI Skin  No concerns Substance abuse   none  See Problem List for Assessment and Plan of chronic medical problems.   FU in 6 months

## 2018-08-02 ENCOUNTER — Other Ambulatory Visit: Payer: Self-pay

## 2018-08-02 ENCOUNTER — Other Ambulatory Visit (INDEPENDENT_AMBULATORY_CARE_PROVIDER_SITE_OTHER): Payer: 59

## 2018-08-02 ENCOUNTER — Ambulatory Visit (INDEPENDENT_AMBULATORY_CARE_PROVIDER_SITE_OTHER): Payer: 59 | Admitting: Internal Medicine

## 2018-08-02 ENCOUNTER — Encounter: Payer: Self-pay | Admitting: Internal Medicine

## 2018-08-02 VITALS — BP 118/72 | HR 95 | Temp 98.6°F | Resp 16 | Ht 63.0 in | Wt 137.1 lb

## 2018-08-02 DIAGNOSIS — I10 Essential (primary) hypertension: Secondary | ICD-10-CM | POA: Diagnosis not present

## 2018-08-02 DIAGNOSIS — Z0001 Encounter for general adult medical examination with abnormal findings: Secondary | ICD-10-CM

## 2018-08-02 DIAGNOSIS — K219 Gastro-esophageal reflux disease without esophagitis: Secondary | ICD-10-CM | POA: Diagnosis not present

## 2018-08-02 DIAGNOSIS — R590 Localized enlarged lymph nodes: Secondary | ICD-10-CM | POA: Diagnosis not present

## 2018-08-02 DIAGNOSIS — R739 Hyperglycemia, unspecified: Secondary | ICD-10-CM

## 2018-08-02 DIAGNOSIS — M2550 Pain in unspecified joint: Secondary | ICD-10-CM

## 2018-08-02 DIAGNOSIS — E039 Hypothyroidism, unspecified: Secondary | ICD-10-CM

## 2018-08-02 LAB — LIPID PANEL
Cholesterol: 200 mg/dL (ref 0–200)
HDL: 48.1 mg/dL (ref >39.00)
LDL Cholesterol: 117 mg/dL — ABNORMAL HIGH (ref 0–99)
NonHDL: 152.34
Total CHOL/HDL Ratio: 4
Triglycerides: 176 mg/dL — ABNORMAL HIGH (ref 0.0–149.0)
VLDL: 35.2 mg/dL (ref 0.0–40.0)

## 2018-08-02 LAB — HEMOGLOBIN A1C: Hgb A1c MFr Bld: 5.6 % (ref 4.6–6.5)

## 2018-08-02 LAB — COMPREHENSIVE METABOLIC PANEL
ALT: 11 U/L (ref 0–35)
AST: 12 U/L (ref 0–37)
Albumin: 4.5 g/dL (ref 3.5–5.2)
Alkaline Phosphatase: 78 U/L (ref 39–117)
BUN: 19 mg/dL (ref 6–23)
CO2: 31 mEq/L (ref 19–32)
Calcium: 9.7 mg/dL (ref 8.4–10.5)
Chloride: 97 mEq/L (ref 96–112)
Creatinine, Ser: 0.78 mg/dL (ref 0.40–1.20)
GFR: 79.81 mL/min (ref >60.00)
Glucose, Bld: 88 mg/dL (ref 70–99)
Potassium: 3.5 mEq/L (ref 3.5–5.1)
Sodium: 137 mEq/L (ref 135–145)
Total Bilirubin: 0.3 mg/dL (ref 0.2–1.2)
Total Protein: 7.1 g/dL (ref 6.0–8.3)

## 2018-08-02 LAB — CBC WITH DIFFERENTIAL/PLATELET
Basophils Absolute: 0.1 10*3/uL (ref 0.0–0.1)
Basophils Relative: 1.1 % (ref 0.0–3.0)
Eosinophils Absolute: 0.4 10*3/uL (ref 0.0–0.7)
Eosinophils Relative: 3.7 % (ref 0.0–5.0)
HCT: 41 % (ref 36.0–46.0)
Hemoglobin: 14.2 g/dL (ref 12.0–15.0)
Lymphocytes Relative: 35.4 % (ref 12.0–46.0)
Lymphs Abs: 4.1 10*3/uL — ABNORMAL HIGH (ref 0.7–4.0)
MCHC: 34.7 g/dL (ref 30.0–36.0)
MCV: 90.4 fl (ref 78.0–100.0)
Monocytes Absolute: 0.7 10*3/uL (ref 0.1–1.0)
Monocytes Relative: 6 % (ref 3.0–12.0)
Neutro Abs: 6.2 10*3/uL (ref 1.4–7.7)
Neutrophils Relative %: 53.8 % (ref 43.0–77.0)
Platelets: 328 10*3/uL (ref 150.0–400.0)
RBC: 4.53 Mil/uL (ref 3.87–5.11)
RDW: 12.9 % (ref 11.5–15.5)
WBC: 11.5 10*3/uL — ABNORMAL HIGH (ref 4.0–10.5)

## 2018-08-02 LAB — TSH: TSH: 1.07 u[IU]/mL (ref 0.35–4.50)

## 2018-08-02 MED ORDER — TRAMADOL HCL 50 MG PO TABS: 50.0000 mg | ORAL_TABLET | Freq: Two times a day (BID) | ORAL | 0 refills | Status: DC | PRN

## 2018-08-02 MED ORDER — FLUTICASONE PROPIONATE 50 MCG/ACT NA SUSP: 2.0000 | Freq: Every day | NASAL | 6 refills | Status: DC

## 2018-08-02 MED FILL — traMADol HCL 50 MG TABS: 50 | 15 days supply | Qty: 30 | Fill #0

## 2018-08-02 NOTE — Assessment & Plan Note (Signed)
Taking omeprazole daily - will try to go back to pepcid once available

## 2018-08-02 NOTE — Assessment & Plan Note (Addendum)
Joint pain in several areas Sees Dr Tamala Julian, sees an integrative doctor On an anti-inflammatory diet Taking tramadol prn only - does not take it often - at most once a week Will continue - use only as needed Takes tylenol or advil prn

## 2018-08-02 NOTE — Assessment & Plan Note (Signed)
BP well controlled Current regimen effective and well tolerated Continue current medications at current doses cmp  

## 2018-08-02 NOTE — Assessment & Plan Note (Signed)
Chronic, stable

## 2018-08-02 NOTE — Assessment & Plan Note (Signed)
Check a1c

## 2018-08-02 NOTE — Assessment & Plan Note (Signed)
Clinically euthyroid Check tsh  Titrate med dose if needed  

## 2018-08-03 ENCOUNTER — Encounter: Payer: Self-pay | Admitting: Internal Medicine

## 2018-08-08 NOTE — Progress Notes (Signed)
[name] [address], [place] [address] Phone: [phone] Subjective:   [name], am serving as a scribe for Dr. Hulan Saas.  I'm seeing this patient by the request  of:    CC: Upper and lower back pain follow-up  [id number]  [name] is a 43 y.o. female coming in with complaint of back pain. Is having right scapula and right SI joint pain.  Patient has been doing remarkably well she states.  Patient states some mild discomfort here and there.  Did have manipulation by another provider once in the last 3 months.  Patient describes the pain as a dull, throbbing aching sensation but nothing that is intolerable at the moment.     Past Medical History:  Diagnosis Date  . Allergy   . Hypertension    Past Surgical History:  Procedure Laterality Date  . CHOLECYSTECTOMY    . TUBAL LIGATION     Social History   Socioeconomic History  . Marital status: Married    Spouse name: Not on file  . Number of children: Not on file  . Years of education: Not on file  . Highest education level: Not on file  Occupational History  . Not on file  Social Needs  . Financial resource strain: Not on file  . Food insecurity:    Worry: Not on file    Inability: Not on file  . Transportation needs:    Medical: Not on file    Non-medical: Not on file  Tobacco Use  . Smoking status: Never Smoker  . Smokeless tobacco: Never Used  Substance and Sexual Activity  . Alcohol use: Yes  . Drug use: No  . Sexual activity: Yes  Lifestyle  . Physical activity:    Days per week: Not on file    Minutes per session: Not on file  . Stress: Not on file  Relationships  . Social connections:    Talks on phone: Not on file    Gets together: Not on file    Attends religious service: Not on file    Active member of club or organization: Not on file    Attends meetings of clubs or organizations: Not on file    Relationship status: Not on file   Other Topics Concern  . 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 Lamisil [Terbinafine] Hives and Itching  . Terbinafine Hcl Hives   Family History  Problem Relation Age of Onset  . Cancer Mother        Breast Cancer  . Breast cancer Mother   . Hypertension Father   . Hyperlipidemia Father   . Other Brother        lyme  . Stroke Maternal Grandmother   . Alcohol abuse Paternal Grandmother   . Heart disease Paternal Grandfather     Current Outpatient Medications (Endocrine & Metabolic):  .  levothyroxine (SYNTHROID, LEVOTHROID) 50 MCG tablet, Take 1 tablet (50 mcg total) by mouth daily.  Current Outpatient Medications (Cardiovascular):  .  amLODipine (NORVASC) 5 MG tablet, Take 1 tablet (5 mg total) by mouth daily. .  hydrochlorothiazide (HYDRODIURIL) 25 MG tablet, Take 1 tablet (25 mg total) by mouth daily.  Current Outpatient Medications (Respiratory):  Marland Kitchen  Fexofenadine HCl (ALLEGRA PO), Take by mouth. .  fluticasone (FLONASE) 50 MCG/ACT nasal spray, Place 2 sprays  into both nostrils daily.  Current Outpatient Medications (Analgesics):  .  acetaminophen (TYLENOL) 650 MG CR tablet, Take 650 mg by mouth every 8 (eight) hours as needed for pain. .  traMADol (ULTRAM) 50 MG tablet, Take 1 tablet (50 mg total) by mouth every 12 (twelve) hours as needed.   Current Outpatient Medications (Other):  Marland Kitchen  Cholecalciferol (VITAMIN D3) 2000 units TABS, Take 2,000 Units by mouth daily. .  DULoxetine (CYMBALTA) 60 MG capsule, TAKE 1 CAPSULE BY MOUTH DAILY. Marland Kitchen  gabapentin (NEURONTIN) 100 MG capsule, TAKE 2 CAPSULES BY MOUTH AT BEDTIME. Marland Kitchen  Misc Natural Products (CALCIUM PYRUVATE PO), Take by mouth. .  nitrofurantoin (MACRODANTIN) 50 MG capsule, TAKE AFTER INTERCOURSE .  Prasterone, DHEA, (DHEA ADVANCED FORMULA PO), Take 5 mg by mouth.    Past medical history, social, surgical and family history all reviewed  in electronic medical record.  No pertanent information unless stated regarding to the chief complaint.   Review of Systems:  No headache, visual changes, nausea, vomiting, diarrhea, constipation, dizziness, abdominal pain, skin rash, fevers, chills, night sweats, weight loss, swollen lymph nodes, body aches, joint swelling,  chest pain, shortness of breath, mood changes.  Positive muscle aches  Objective  Blood pressure 110/78, pulse (!) 106, height 5' 3"  (1.6 m), weight 137 lb (62.1 kg), SpO2 98 %.    General: No apparent distress alert and oriented x3 mood and affect normal, dressed appropriately.  HEENT: Pupils equal, extraocular movements intact  Respiratory: Patient's speak in full sentences and does not appear short of breath  Cardiovascular: No lower extremity edema, non tender, no erythema  Skin: Warm dry intact with no signs of infection or rash on extremities or on axial skeleton.  Abdomen: Soft nontender  Neuro: Cranial nerves II through XII are intact, neurovascularly intact in all extremities with 2+ DTRs and 2+ pulses.  Lymph: No lymphadenopathy of posterior or anterior cervical chain or axillae bilaterally.  Gait normal with good balance and coordination.  MSK:  Non tender with full range of motion and good stability and symmetric strength and tone of shoulders, elbows, wrist, hip, knee and ankles bilaterally.  Upper back exam still shows some mild scapular dyskinesis noted right greater than left.  Tenderness over the left sacroiliac joint.  Unusual exam.  Positive Faber.  Negative straight leg test.  Mild pain in the paraspinal musculature of the lumbar spine  Osteopathic findings C6 flexed rotated and side bent left T3 extended rotated and side bent right inhaled third rib T9 extended rotated and side bent left L2 flexed rotated and side bent right Sacrum left on left    Impression and Recommendations:     This case required medical decision making of moderate  complexity. The above documentation has been reviewed and is accurate and complete Lyndal Pulley, DO       Note: This dictation was prepared with Dragon dictation along with smaller phrase technology. Any transcriptional errors that result from this process are unintentional.

## 2018-08-09 ENCOUNTER — Ambulatory Visit (INDEPENDENT_AMBULATORY_CARE_PROVIDER_SITE_OTHER): Payer: 59 | Admitting: Family Medicine

## 2018-08-09 ENCOUNTER — Encounter: Payer: Self-pay | Admitting: Family Medicine

## 2018-08-09 ENCOUNTER — Other Ambulatory Visit: Payer: Self-pay

## 2018-08-09 VITALS — BP 110/78 | HR 106 | Ht 63.0 in | Wt 137.0 lb

## 2018-08-09 DIAGNOSIS — Z6824 Body mass index (BMI) 24.0-24.9, adult: Secondary | ICD-10-CM | POA: Diagnosis not present

## 2018-08-09 DIAGNOSIS — Z01419 Encounter for gynecological examination (general) (routine) without abnormal findings: Secondary | ICD-10-CM | POA: Diagnosis not present

## 2018-08-09 DIAGNOSIS — M999 Biomechanical lesion, unspecified: Secondary | ICD-10-CM

## 2018-08-09 DIAGNOSIS — M533 Sacrococcygeal disorders, not elsewhere classified: Secondary | ICD-10-CM | POA: Diagnosis not present

## 2018-08-09 NOTE — Patient Instructions (Signed)
2 month f/u

## 2018-08-09 NOTE — Assessment & Plan Note (Signed)
Patient is doing remarkably well at this time. Great with cymbalta  Patient is doing well with change of medications.  Encourage patient to continue to be active as she is.  Follow-up with me again in 4 to 8 weeks

## 2018-08-09 NOTE — Assessment & Plan Note (Signed)
Decision today to treat with OMT was based on Physical Exam  After verbal consent patient was treated with HVLA, ME, FPR techniques in cervical, thoracic, rib lumbar and sacral pelvis areas  Patient tolerated the procedure well with improvement in symptoms  Patient given exercises, stretches and lifestyle modifications  See medications in patient instructions if given  Patient will follow up in 8 weeks

## 2018-08-18 ENCOUNTER — Other Ambulatory Visit: Payer: Self-pay | Admitting: Obstetrics & Gynecology

## 2018-08-18 DIAGNOSIS — Z1231 Encounter for screening mammogram for malignant neoplasm of breast: Secondary | ICD-10-CM

## 2018-08-22 ENCOUNTER — Other Ambulatory Visit: Payer: Self-pay | Admitting: Internal Medicine

## 2018-08-22 DIAGNOSIS — R102 Pelvic and perineal pain: Secondary | ICD-10-CM | POA: Diagnosis not present

## 2018-08-22 DIAGNOSIS — M6281 Muscle weakness (generalized): Secondary | ICD-10-CM | POA: Diagnosis not present

## 2018-08-22 DIAGNOSIS — N393 Stress incontinence (female) (male): Secondary | ICD-10-CM | POA: Diagnosis not present

## 2018-08-22 DIAGNOSIS — M62838 Other muscle spasm: Secondary | ICD-10-CM | POA: Diagnosis not present

## 2018-08-23 MED FILL — HYDROCHLOROTHIAZIDE 25 MG T: 25 | 90 days supply | Qty: 90 | Fill #0

## 2018-08-23 MED FILL — AMLODIPINE BESYLATE 5 MG TA: 5 | 90 days supply | Qty: 90 | Fill #0

## 2018-09-05 DIAGNOSIS — R1031 Right lower quadrant pain: Secondary | ICD-10-CM | POA: Diagnosis not present

## 2018-09-05 DIAGNOSIS — R102 Pelvic and perineal pain: Secondary | ICD-10-CM | POA: Diagnosis not present

## 2018-09-05 DIAGNOSIS — M62838 Other muscle spasm: Secondary | ICD-10-CM | POA: Diagnosis not present

## 2018-09-05 DIAGNOSIS — M6281 Muscle weakness (generalized): Secondary | ICD-10-CM | POA: Diagnosis not present

## 2018-09-05 DIAGNOSIS — N393 Stress incontinence (female) (male): Secondary | ICD-10-CM | POA: Diagnosis not present

## 2018-09-16 ENCOUNTER — Other Ambulatory Visit: Payer: Self-pay

## 2018-09-16 ENCOUNTER — Ambulatory Visit
Admission: RE | Admit: 2018-09-16 | Discharge: 2018-09-16 | Disposition: A | Discharge status: Home / Self Care | Payer: 59 | Source: Ambulatory Visit | Attending: Obstetrics & Gynecology | Admitting: Obstetrics & Gynecology

## 2018-09-16 DIAGNOSIS — Z1231 Encounter for screening mammogram for malignant neoplasm of breast: Secondary | ICD-10-CM

## 2018-09-17 ENCOUNTER — Other Ambulatory Visit: Payer: Self-pay | Admitting: Family Medicine

## 2018-09-20 ENCOUNTER — Other Ambulatory Visit: Payer: Self-pay | Admitting: Obstetrics & Gynecology

## 2018-09-20 DIAGNOSIS — R928 Other abnormal and inconclusive findings on diagnostic imaging of breast: Secondary | ICD-10-CM

## 2018-09-20 MED FILL — DULOXETINE HCL 60 MG CPEP: 60 | 90 days supply | Qty: 90 | Fill #1

## 2018-09-21 MED FILL — LEVOTHYROXINE 50 MCG TABLET: 50 | 90 days supply | Qty: 90 | Fill #0

## 2018-09-26 ENCOUNTER — Other Ambulatory Visit: Payer: Self-pay

## 2018-09-26 ENCOUNTER — Ambulatory Visit
Admission: RE | Admit: 2018-09-26 | Discharge: 2018-09-26 | Disposition: A | Discharge status: Home / Self Care | Payer: 59 | Source: Ambulatory Visit | Attending: Obstetrics & Gynecology | Admitting: Obstetrics & Gynecology

## 2018-09-26 DIAGNOSIS — R928 Other abnormal and inconclusive findings on diagnostic imaging of breast: Secondary | ICD-10-CM

## 2018-09-26 DIAGNOSIS — R922 Inconclusive mammogram: Secondary | ICD-10-CM | POA: Diagnosis not present

## 2018-09-26 DIAGNOSIS — N6012 Diffuse cystic mastopathy of left breast: Secondary | ICD-10-CM | POA: Diagnosis not present

## 2018-10-03 ENCOUNTER — Other Ambulatory Visit: Payer: Self-pay

## 2018-10-03 ENCOUNTER — Encounter: Payer: Self-pay | Admitting: Family Medicine

## 2018-10-03 ENCOUNTER — Ambulatory Visit: Payer: 59 | Admitting: Family Medicine

## 2018-10-03 VITALS — BP 124/80 | HR 103 | Ht 63.0 in | Wt 138.0 lb

## 2018-10-03 DIAGNOSIS — M2550 Pain in unspecified joint: Secondary | ICD-10-CM

## 2018-10-03 DIAGNOSIS — M999 Biomechanical lesion, unspecified: Secondary | ICD-10-CM | POA: Diagnosis not present

## 2018-10-03 DIAGNOSIS — M533 Sacrococcygeal disorders, not elsewhere classified: Secondary | ICD-10-CM

## 2018-10-03 MED ORDER — VENLAFAXINE HCL ER 37.5 MG PO CP24: 37.5000 mg | ORAL_CAPSULE | Freq: Every day | ORAL | 0 refills | Status: DC

## 2018-10-03 MED ORDER — PREDNISONE 50 MG PO TABS: ORAL_TABLET | ORAL | 0 refills | Status: DC

## 2018-10-03 MED ORDER — MELOXICAM 15 MG PO TABS: 15.0000 mg | ORAL_TABLET | Freq: Every day | ORAL | 0 refills | Status: DC

## 2018-10-03 MED FILL — predniSONE 50 MG TABS: 50 | 5 days supply | Qty: 5 | Fill #0

## 2018-10-03 NOTE — Patient Instructions (Signed)
Predinose for 5 days Follow up in 6 weeks

## 2018-10-03 NOTE — Progress Notes (Signed)
[name] [address] [place], [place] [address] Phone: [phone] Subjective:   I [name] am serving as a Education administrator for Dr. Hulan Saas.   CC: Low back pain follow-up  [id number]  [name] is a 43 y.o. female coming in with complaint of back pain. States that she is not doing well.  Patient states more aches and pains recently.  Feels like she is having a flare.  Concern for some potential autoimmune disease patient has been seen by multiple different providers.  Does feel better when patient does increase activity slowly.  Patient has been taking different medications with minimal benefit.     Past Medical History:  Diagnosis Date  . Allergy   . Hypertension    Past Surgical History:  Procedure Laterality Date  . CHOLECYSTECTOMY    . TUBAL LIGATION     Social History   Socioeconomic History  . Marital status: Married    Spouse name: Not on file  . Number of children: Not on file  . Years of education: Not on file  . Highest education level: Not on file  Occupational History  . Not on file  Social Needs  . Financial resource strain: Not on file  . Food insecurity    Worry: Not on file    Inability: Not on file  . Transportation needs    Medical: Not on file    Non-medical: Not on file  Tobacco Use  . Smoking status: Never Smoker  . Smokeless tobacco: Never Used  Substance and Sexual Activity  . Alcohol use: Yes  . Drug use: No  . Sexual activity: Yes  Lifestyle  . Physical activity    Days per week: Not on file    Minutes per session: Not on file  . Stress: Not on file  Relationships  . Social Herbalist on phone: Not on file    Gets together: Not on file    Attends religious service: Not on file    Active member of club or organization: Not on file    Attends meetings of clubs or organizations: Not on file    Relationship status: Not on file  Other Topics Concern  . 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 Lamisil [Terbinafine] Hives and Itching  . Terbinafine Hcl Hives   Family History  Problem Relation Age of Onset  . Cancer Mother        Breast Cancer  . Breast cancer Mother   . Hypertension Father   . Hyperlipidemia Father   . Other Brother        lyme  . Stroke Maternal Grandmother   . Alcohol abuse Paternal Grandmother   . Heart disease Paternal Grandfather     Current Outpatient Medications (Endocrine & Metabolic):  .  levothyroxine (SYNTHROID) 50 MCG tablet, TAKE 1 TABLET (50 MCG TOTAL) BY MOUTH DAILY. Marland Kitchen  predniSONE (DELTASONE) 50 MG tablet, 1 tablet a day for 5 days  Current Outpatient Medications (Cardiovascular):  .  amLODipine (NORVASC) 5 MG tablet, TAKE 1 TABLET BY MOUTH DAILY. .  hydrochlorothiazide (HYDRODIURIL) 25 MG tablet, TAKE 1 TABLET BY MOUTH DAILY.  Current Outpatient Medications (Respiratory):  Marland Kitchen  Fexofenadine HCl (ALLEGRA PO), Take by mouth. .  fluticasone (FLONASE) 50 MCG/ACT nasal spray, Place 2 sprays into both nostrils daily.  Current Outpatient  Medications (Analgesics):  .  acetaminophen (TYLENOL) 650 MG CR tablet, Take 650 mg by mouth every 8 (eight) hours as needed for pain. .  traMADol (ULTRAM) 50 MG tablet, Take 1 tablet (50 mg total) by mouth every 12 (twelve) hours as needed.   Current Outpatient Medications (Other):  Marland Kitchen  Cholecalciferol (VITAMIN D3) 2000 units TABS, Take 2,000 Units by mouth daily. .  DULoxetine (CYMBALTA) 60 MG capsule, TAKE 1 CAPSULE BY MOUTH DAILY. Marland Kitchen  gabapentin (NEURONTIN) 100 MG capsule, TAKE 2 CAPSULES BY MOUTH AT BEDTIME. Marland Kitchen  Misc Natural Products (CALCIUM PYRUVATE PO), Take by mouth. .  nitrofurantoin (MACRODANTIN) 50 MG capsule, TAKE AFTER INTERCOURSE .  Prasterone, DHEA, (DHEA ADVANCED FORMULA PO), Take 5 mg by mouth.    Past medical history, social, surgical and family history all reviewed in  electronic medical record.  No pertanent information unless stated regarding to the chief complaint.   Review of Systems:  No headache, visual changes, nausea, vomiting, diarrhea, constipation, dizziness, abdominal pain, skin rash, fevers, chills, night sweats, weight loss, swollen lymph nodes, body aches, joint swelling, , chest pain, shortness of breath, mood changes.  Positive muscle aches  Objective  Blood pressure 124/80, pulse (!) 103, height 5' 3"  (1.6 m), weight 138 lb (62.6 kg), SpO2 97 %.    General: No apparent distress alert and oriented x3 mood and affect normal, dressed appropriately.  HEENT: Pupils equal, extraocular movements intact  Respiratory: Patient's speak in full sentences and does not appear short of breath  Cardiovascular: No lower extremity edema, non tender, no erythema  Skin: Warm dry intact with no signs of infection or rash on extremities or on axial skeleton.  Abdomen: Soft nontender  Neuro: Cranial nerves II through XII are intact, neurovascularly intact in all extremities with 2+ DTRs and 2+ pulses.  Lymph: No lymphadenopathy of posterior or anterior cervical chain or axillae bilaterally.  Gait normal with good balance and coordination.  MSK:  tender with full range of motion and good stability and symmetric strength and tone of shoulders, elbows, wrist, hip, knee and ankles bilaterally.  Back exam has mild loss of lordosis.  Patient has negative straight leg test.  Near full range of motion but patient is somewhat stiff at baseline.  Patient neurovascular intact distally with 5 out of 5 strength.  Osteopathic findings  C4 flexed rotated and side bent left C6 flexed rotated and side bent left T3 extended rotated and side bent right inhaled third rib T9 extended rotated and side bent left L2 flexed rotated and side bent right Sacrum right on right Pelvic shear noted     Impression and Recommendations:     This case required medical decision making  of moderate complexity. The above documentation has been reviewed and is accurate and complete Lorraine Pulley, DO       Note: This dictation was prepared with Dragon dictation along with smaller phrase technology. Any transcriptional errors that result from this process are unintentional.

## 2018-10-04 ENCOUNTER — Encounter: Payer: Self-pay | Admitting: Family Medicine

## 2018-10-04 NOTE — Assessment & Plan Note (Addendum)
Decision today to treat with OMT was based on Physical Exam  After verbal consent patient was treated with HVLA, ME, FPR techniques in cervical, thoracic, rib, lumbar and sacral and pelvis  areas  Patient tolerated the procedure well with improvement in symptoms  Patient given exercises, stretches and lifestyle modifications  See medications in patient instructions if given  Patient will follow up in 4-8 weeks

## 2018-10-04 NOTE — Assessment & Plan Note (Signed)
Doing well.  Discussed the muscle imbalances.  Patient does not have any radicular symptoms.  Discussed which activities to do which wants to avoid patient wants to increase activity slowly.  Prednisone given for flare.  Follow-up again in 4 to 6 weeks

## 2018-10-20 ENCOUNTER — Ambulatory Visit: Payer: 59 | Admitting: Gastroenterology

## 2018-12-01 MED FILL — HYDROCHLOROTHIAZIDE 25 MG T: 25 | 90 days supply | Qty: 90 | Fill #1

## 2018-12-01 MED FILL — AMLODIPINE BESYLATE 5 MG TA: 5 | 90 days supply | Qty: 90 | Fill #1

## 2018-12-08 ENCOUNTER — Telehealth: Payer: Self-pay | Admitting: Gastroenterology

## 2018-12-08 NOTE — Telephone Encounter (Signed)
Pt called stating that her results were negative and she would like to know if her appt will be virtual or in-person.

## 2018-12-08 NOTE — Telephone Encounter (Signed)
Sorry to hear she is not feeling well.  Certainly best that she does not come to the office, especially while awaiting covered results.  Yes I will do a Doximity visit with her.  Please make sure her mobile number is on file, and that she has a smart phone with both camera and microphone.  I will send her a text message invitation sometime between 31 and 1130, depending upon how the office is running.

## 2018-12-08 NOTE — Telephone Encounter (Signed)
Dr. Loletha Carrow, this patient has been having COVID symptoms and was tested but has not received her results. She is scheduled to see you in the office tomorrow at 11:00 am. She wants to know if you would do a virtual visit or does she need to be rescheduled. This is a new patient. Please advise.

## 2018-12-08 NOTE — Telephone Encounter (Signed)
Please see notes below, this patient will do Doximity with Dr. Loletha Carrow tomorrow.

## 2018-12-08 NOTE — Telephone Encounter (Signed)
Patient contacted, she will be in the office in person.

## 2018-12-08 NOTE — Telephone Encounter (Signed)
Please advise 

## 2018-12-08 NOTE — Telephone Encounter (Signed)
As a new patient, it would be better for her to come to the office (since COVID test negative).  I would be able to do a more comprehensive evaluation.

## 2018-12-09 ENCOUNTER — Ambulatory Visit: Payer: 59 | Admitting: Gastroenterology

## 2018-12-09 ENCOUNTER — Encounter: Payer: Self-pay | Admitting: Gastroenterology

## 2018-12-09 VITALS — BP 120/78 | HR 108 | Temp 98.6°F | Ht 63.0 in | Wt 143.0 lb

## 2018-12-09 DIAGNOSIS — K5909 Other constipation: Secondary | ICD-10-CM

## 2018-12-09 DIAGNOSIS — R140 Abdominal distension (gaseous): Secondary | ICD-10-CM | POA: Diagnosis not present

## 2018-12-09 NOTE — Patient Instructions (Signed)
If you are age 45 or older, your body mass index should be between 23-30. Your Body mass index is 25.33 kg/m. If this is out of the aforementioned range listed, please consider follow up with your Primary Care Provider.  If you are age 75 or younger, your body mass index should be between 19-25. Your Body mass index is 25.33 kg/m. If this is out of the aformentioned range listed, please consider follow up with your Primary Care Provider.   Start Miralax, try one capful daily.  Call us if no improvement  It was a pleasure to see you today!  Dr. Loletha Carrow   Constipation, Adult Constipation is when a person:  Poops (has a bowel movement) fewer times in a week than normal.  Has a hard time pooping.  Has poop that is dry, hard, or bigger than normal. Follow these instructions at home: Eating and drinking   Eat foods that have a lot of fiber, such as: ? Fresh fruits and vegetables. ? Whole grains. ? Beans.  Eat less of foods that are high in fat, low in fiber, or overly processed, such as: ? [place] fries. ? Hamburgers. ? Cookies. ? Candy. ? Soda.  Drink enough fluid to keep your pee (urine) clear or pale yellow. General instructions  Exercise regularly or as told by your doctor.  Go to the restroom when you feel like you need to poop. Do not hold it in.  Take over-the-counter and prescription medicines only as told by your doctor. These include any fiber supplements.  Do pelvic floor retraining exercises, such as: ? Doing deep breathing while relaxing your lower belly (abdomen). ? Relaxing your pelvic floor while pooping.  Watch your condition for any changes.  Keep all follow-up visits as told by your doctor. This is important. Contact a doctor if:  You have pain that gets worse.  You have a fever.  You have not pooped for 4 days.  You throw up (vomit).  You are not hungry.  You lose weight.  You are bleeding from the anus.  You have thin, pencil-like poop  (stool). Get help right away if:  You have a fever, and your symptoms suddenly get worse.  You leak poop or have blood in your poop.  Your belly feels hard or bigger than normal (is bloated).  You have very bad belly pain.  You feel dizzy or you faint. This information is not intended to replace advice given to you by your health care provider. Make sure you discuss any questions you have with your health care provider. Document Released: 08/12/2007 Document Revised: 02/05/2017 Document Reviewed: 08/14/2015 Elsevier Patient Education  2020 [employer].

## 2018-12-09 NOTE — Progress Notes (Signed)
[facility] Consult Note:  History: [name] 12/09/2018  Referring provider: Dr. 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  She reports several years of slow onset constipation, with a BM every several days, during which time she would become progressively bloated.  She has used some probiotics and occasional stool softener without much improvement.  She denies rectal bleeding or dyschezia. If she is gone a few days without a bowel movement, she still does not feel the need to go most of the time.  When she does have a BM, she sometimes will push on the perineum but is not sure if that seems to help aid the passage of a bowel movement.  She has not done manual disimpaction, she does not feel anything protruding from the vagina or rectum with a bowel movement.  Stools are small and pellet-like.  She was having some abdominal pain over 10 years ago and had a colonoscopy at another clinic or institution, she seems to recall it was normal but does not recall when or where was done. No family history of colon cancer.   ROS:  Review of Systems  Constitutional: Positive for fatigue. Negative for appetite change and unexpected weight change.  HENT: Negative for mouth sores and voice change.   Eyes: Negative for pain and redness.  Respiratory: Negative for cough and shortness of breath.   Cardiovascular: Negative for chest pain and palpitations.  Genitourinary: Negative for dysuria and hematuria.  Musculoskeletal: Positive for arthralgias. Negative for myalgias.  Skin: Negative for pallor and rash.  Neurological: Negative for weakness and headaches.   Hematological: Negative for adenopathy.   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  Past Medical History: Past Medical History:  Diagnosis Date  . Allergy   . Crohn's disease ([place])   . Endometriosis   . Hypertension   . Kidney, medullary sponge   . Ovarian cyst   . Pre-eclampsia   . Raynaud's disease   . Stress incontinence in female      Past Surgical History: Past Surgical History:  Procedure Laterality Date  . CHOLECYSTECTOMY    . DILATION AND CURETTAGE OF UTERUS    . ENDOMETRIAL ABLATION    . NOVASURE ABLATION    . TUBAL LIGATION       Family History: Family History  Problem Relation Age of Onset  . Breast cancer Mother   . Hypertension Father   . Hyperlipidemia Father   . Other Brother        lyme-chronic  . Stroke Maternal Grandmother   . Alcohol abuse Paternal Grandmother   . Heart disease Paternal Grandfather   . Colon cancer Neg Hx   . Esophageal cancer Neg Hx   . Rectal cancer Neg Hx     Social History: Social History   Socioeconomic History  . Marital status: Married    Spouse name: Not on file  . Number of children: 3  . Years of education: Not on file  . Highest education level: Not on file  Occupational History  . Occupation: Programmer, multimedia: [employer]  Social Needs  . Financial resource strain: Not on file  . Food insecurity    Worry: Not on file    Inability: Not on file  . Transportation needs    Medical: Not on file    Non-medical: Not on file  Tobacco Use  . Smoking status: Never Smoker  . Smokeless tobacco: Never Used  Substance and Sexual Activity  . Alcohol use: Yes  . Drug use: No  . Sexual activity: Yes  Lifestyle  . Physical activity    Days per week: Not on file    Minutes per session: Not on file  . Stress: Not on file  Relationships  . Social Product manager on phone: Not on file    Gets together: Not on file    Attends religious service: Not on file    Active member of club or organization: Not on file    Attends meetings of clubs or organizations: Not on file    Relationship status: Not on file  Other Topics Concern  . 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 Lamisil [Terbinafine] Hives and Itching    In pill form    Outpatient Meds: Current Outpatient Medications  Medication Sig Dispense Refill  . acetaminophen (TYLENOL) 650 MG CR tablet Take 650 mg by mouth every 8 (eight) hours as needed for pain.    Marland Kitchen amLODipine (NORVASC) 5 MG tablet TAKE 1 TABLET BY MOUTH DAILY. 90 tablet 1  . Cholecalciferol (VITAMIN D3) 2000 units TABS Take 2,000 Units by mouth daily.    . DULoxetine (CYMBALTA) 60 MG capsule TAKE 1 CAPSULE BY MOUTH DAILY. 90 capsule 3  . Fexofenadine HCl (ALLEGRA PO) Take by mouth.    . fluticasone (FLONASE) 50 MCG/ACT nasal spray Place 2 sprays into both nostrils daily. 16 g 6  . gabapentin (NEURONTIN) 100 MG capsule TAKE 2 CAPSULES BY MOUTH AT BEDTIME. 180 capsule 1  . hydrochlorothiazide (HYDRODIURIL) 25 MG tablet TAKE 1 TABLET BY MOUTH DAILY. 90 tablet 1  . levothyroxine (SYNTHROID) 50 MCG tablet TAKE 1 TABLET (50 MCG TOTAL) BY MOUTH DAILY. 90 tablet 0  . Misc Natural Products (CALCIUM PYRUVATE PO) Take by mouth.    . nitrofurantoin (MACRODANTIN) 50 MG capsule TAKE AFTER INTERCOURSE 45 capsule 0  . Prasterone, DHEA, (DHEA ADVANCED FORMULA PO) Take 5 mg by mouth.    . traMADol (ULTRAM) 50 MG tablet Take 1 tablet (50 mg total) by mouth every 12 (twelve) hours as needed. 30 tablet 0   No current facility-administered medications for this visit.       ___________________________________________________________________ Objective   Exam:  BP 120/78   Pulse (!) 108   Temp 98.6 F (37 C)   Ht 5' 3"  (1.6 m)   Wt 143 lb  (64.9 kg)   BMI 25.33 kg/m    General: Well-appearing, pleasant and conversational  Eyes: sclera anicteric, no redness  ENT: oral mucosa moist without lesions, no cervical or supraclavicular lymphadenopathy  CV: RRR without murmur, S1/S2, no JVD, no peripheral edema  Resp: clear to auscultation bilaterally, normal RR and effort noted  GI: soft, no tenderness, with active bowel sounds. No guarding or palpable organomegaly noted.  Skin; warm and dry, no rash or jaundice noted  Neuro: awake, alert and oriented x 3. Normal gross motor function and fluent speech Rectal: Normal descent of the perineum  with Valsalva.  Normal resting and voluntary sphincter tone, normal puborectalis contraction.  Normal rectal descent felt with Valsalva on DRE. Firm stool in the rectal vault. Labs:  CBC Latest Ref Rng & Units 08/02/2018 07/27/2017 01/21/2016  WBC 4.0 - 10.5 K/uL 11.5(H) 10.7(H) 11.8(H)  Hemoglobin 12.0 - 15.0 g/dL 14.2 14.3 14.1  Hematocrit 36.0 - 46.0 % 41.0 41.8 41.9  Platelets 150.0 - 400.0 K/uL 328.0 307.0 329.0   CMP Latest Ref Rng & Units 08/02/2018 08/09/2017 07/27/2017  Glucose 70 - 99 mg/dL 88 107(H) 97  BUN 6 - 23 mg/dL 19 16 23   Creatinine 0.40 - 1.20 mg/dL 0.78 0.77 0.77  Sodium 135 - 145 mEq/L 137 139 140  Potassium 3.5 - 5.1 mEq/L 3.5 3.4(L) 3.1(L)  Chloride 96 - 112 mEq/L 97 98 99  CO2 19 - 32 mEq/L 31 34(H) 31  Calcium 8.4 - 10.5 mg/dL 9.7 9.7 9.8  Total Protein 6.0 - 8.3 g/dL 7.1 - 7.5  Total Bilirubin 0.2 - 1.2 mg/dL 0.3 - 0.7  Alkaline Phos 39 - 117 U/L 78 - 72  AST 0 - 37 U/L 12 - 10  ALT 0 - 35 U/L 11 - 9   Normal TSH in May   Assessment: Encounter Diagnoses  Name Primary?  . Chronic constipation Yes  . Abdominal bloating     Several years of slow onset constipation, no red flag signs such as rectal bleeding or weight loss to suggest obstruction or malignancy. Most likely generalized decreased colonic motility, does not sound like significant pelvic  floor disc function by history or exam.  Regarding the question of colon cancer screening, although the ACS last year recommended starting colon cancer screening at age 4, the USPSTF and GI professional societies have thus far not felt that the data was strong enough to suggest starting screening earlier than 50.   Plan:  Start MiraLAX one half capful a day, increasing as needed. If that is either not very helpful or side effects or not tolerated, then we could consider a promotility agent like prucalopride or tegaserod.  However, this would require a colonoscopy first to definitely rule out any structural or obstructive causes.  If so, that would be a diagnostic, not a screening procedure.  She understands and is agreeable to the plan, and plans to call clinic nursing or message me by portal in a few weeks with an update.  Thank you for the courtesy of this consult.  Please call me with any questions or concerns.  Nelida Meuse III  CC: Referring provider noted above Billey Gosling, MD

## 2018-12-15 ENCOUNTER — Other Ambulatory Visit: Payer: Self-pay | Admitting: Family Medicine

## 2018-12-15 MED FILL — GABAPENTIN 100 MG CAPSULE: 100 | 90 days supply | Qty: 180 | Fill #0

## 2018-12-19 MED FILL — DULOXETINE HCL 60 MG CPEP: 60 | 90 days supply | Qty: 90 | Fill #2

## 2019-01-05 ENCOUNTER — Other Ambulatory Visit: Payer: Self-pay | Admitting: Internal Medicine

## 2019-01-06 MED FILL — traMADol HCL 50 MG TABS: 50 | 15 days supply | Qty: 30 | Fill #0

## 2019-01-09 ENCOUNTER — Encounter: Payer: Self-pay | Admitting: Family Medicine

## 2019-01-10 MED ORDER — PREDNISONE 50 MG PO TABS: 50.0000 mg | ORAL_TABLET | Freq: Every day | ORAL | 0 refills | Status: DC

## 2019-01-10 MED FILL — predniSONE 50 MG TABS: 50 | 5 days supply | Qty: 5 | Fill #0

## 2019-02-05 NOTE — Progress Notes (Addendum)
Subjective:    Patient ID: [name], female    DOB: [birth year], 43 y.o.   MRN: [id number]  HPI The patient is here for follow up.  She is exercising regularly - walking.    She continues to have fatigue, joint pain and some inflammation (redness) in her joints.   She did take prednisone earlier this month and it helped.    Hypertension: She is taking her medication daily. She is compliant with a low sodium diet.  She denies chest pain, palpitations, edema, shortness of breath and regular headaches. She does not monitor her blood pressure at home.    Hypothyroidism:  She is taking her medication daily.  She denies any recent changes in energy or weight that are unexplained.   Polyarthralgia:   She is taking tylenol, advil as needed and on occasion tramadol for more severe pain.  She is taking Cymbalta which helps.    Hyperglycemia:  She is less compliant with a low sugar/carbohydrate diet and knows she needs to work on improving her diet.  She is exercising regularly.   Medications and allergies reviewed with patient and updated if appropriate.  Patient Active Problem List   Diagnosis Date Noted  . Hypothyroidism (acquired) 08/02/2018  . Nonallopathic lesion of rib cage 10/08/2017  . Hyperglycemia 07/27/2017  . Antalgic gait 04/19/2017  . Polyarthralgia 02/26/2016  . GERD (gastroesophageal reflux disease) 02/17/2016  . Cervical lymphadenopathy 01/21/2016  . Quadriceps muscle strain 10/14/2015  . Cough 09/14/2015  . Nonallopathic lesion of thoracic region 08/23/2015  . Lumbar radiculopathy 08/02/2015  . 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 Scapular dysfunction 03/20/2015  . 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 Nonallopathic lesion of lumbosacral region 06/29/2014  . Nonallopathic lesion of sacral region 06/29/2014  . Nonallopathic lesion of pelvic region 06/29/2014  . Recurrent UTI 05/18/2013  .  Back pain, acute 10/07/2010  . Allergic rhinitis 08/14/2010  . Essential hypertension, benign 01/22/2010    Current Outpatient Medications on File Prior to Visit  Medication Sig Dispense Refill  . acetaminophen (TYLENOL) 650 MG CR tablet Take 650 mg by mouth every 8 (eight) hours as needed for pain.    Marland Kitchen amLODipine (NORVASC) 5 MG tablet TAKE 1 TABLET BY MOUTH DAILY. 90 tablet 1  . Cholecalciferol (VITAMIN D3) 2000 units TABS Take 2,000 Units by mouth daily.    . DULoxetine (CYMBALTA) 60 MG capsule TAKE 1 CAPSULE BY MOUTH DAILY. 90 capsule 3  . Fexofenadine HCl (ALLEGRA PO) Take by mouth.    . fluticasone (FLONASE) 50 MCG/ACT nasal spray Place 2 sprays into both nostrils daily. 16 g 6  . gabapentin (NEURONTIN) 100 MG capsule TAKE 2 CAPSULES BY MOUTH AT BEDTIME. 180 capsule 1  . hydrochlorothiazide (HYDRODIURIL) 25 MG tablet TAKE 1 TABLET BY MOUTH DAILY. 90 tablet 1  . levothyroxine (SYNTHROID) 50 MCG tablet TAKE 1 TABLET (50 MCG TOTAL) BY MOUTH DAILY. 90 tablet 0  . Misc Natural Products (CALCIUM PYRUVATE PO) Take by mouth.    . nitrofurantoin (MACRODANTIN) 50 MG capsule TAKE AFTER INTERCOURSE 45 capsule 0  . Prasterone, DHEA, (DHEA ADVANCED FORMULA PO) Take 5 mg by mouth.    . traMADol (ULTRAM) 50 MG tablet TAKE 1 TABLET (50 MG TOTAL) BY MOUTH EVERY 12 (TWELVE) HOURS AS NEEDED. 30 tablet 0   No current facility-administered medications on file prior to visit.     Past Medical History:  Diagnosis Date  .  Allergy   . Crohn's disease ([place])   . Endometriosis   . Hypertension   . Kidney, medullary sponge   . Ovarian cyst   . Pre-eclampsia   . Raynaud's disease   . Stress incontinence in female     Past Surgical History:  Procedure Laterality Date  . CHOLECYSTECTOMY    . DILATION AND CURETTAGE OF UTERUS    . ENDOMETRIAL ABLATION    . NOVASURE ABLATION    . TUBAL LIGATION      Social History   Socioeconomic History  . Marital status: Married    Spouse name: Not on file  .  Number of children: 3  . Years of education: Not on file  . Highest education level: Not on file  Occupational History  . Occupation: Programmer, multimedia: [employer]  . Financial resource strain: Not on file  . Food insecurity    Worry: Not on file    Inability: Not on file  . Transportation needs    Medical: Not on file    Non-medical: Not on file  Tobacco Use  . Smoking status: Never Smoker  . Smokeless tobacco: Never Used  Substance and Sexual Activity  . Alcohol use: Yes  . Drug use: No  . Sexual activity: Yes  Lifestyle  . Physical activity    Days per week: Not on file    Minutes per session: Not on file  . Stress: Not on file  Relationships  . Social Herbalist on phone: Not on file    Gets together: Not on file    Attends religious service: Not on file    Active member of club or organization: Not on file    Attends meetings of clubs or organizations: Not on file    Relationship status: Not on file  Other Topics Concern  . 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 Breast cancer Mother   . Hypertension Father   . Hyperlipidemia Father   . Other Brother        lyme-chronic  . Stroke Maternal Grandmother   . Alcohol abuse Paternal Grandmother   . Heart disease Paternal Grandfather   . Colon cancer Neg Hx   . Esophageal cancer Neg Hx   . Rectal cancer Neg Hx     Review of Systems  Constitutional: Positive for fatigue. Negative for fever.  Respiratory: Positive for chest tightness (from allergies). Negative for cough, shortness of breath and wheezing.   Cardiovascular: Negative for chest pain, palpitations and leg swelling.  Musculoskeletal: Positive for arthralgias.  Neurological: Negative for light-headedness and headaches.       Objective:   Vitals:   02/06/19 1124  BP: 124/84  Pulse: 77  Resp: 16  Temp: 98.1 F (36.7 C)   SpO2: 99%   BP Readings from Last 3 Encounters:  02/06/19 124/84  12/09/18 120/78  10/03/18 124/80   Wt Readings from Last 3 Encounters:  02/06/19 145 lb 12.8 oz (66.1 kg)  12/09/18 143 lb (64.9 kg)  10/03/18 138 lb (62.6 kg)   Body mass index is 25.83 kg/m.   Physical Exam    Constitutional: Appears well-developed and well-nourished. No distress.  HENT:  Head: Normocephalic and atraumatic.  Neck: Neck supple. No tracheal deviation present. No thyromegaly present.  No cervical lymphadenopathy Cardiovascular: Normal  rate, regular rhythm and normal heart sounds.  No murmur heard. No carotid bruit .  No edema Pulmonary/Chest: Effort normal and breath sounds normal. No respiratory distress. No has no wheezes. No rales.  Skin: Skin is warm and dry. Not diaphoretic.  Psychiatric: Normal mood and affect. Behavior is normal.      Assessment & Plan:    See Problem List for Assessment and Plan of chronic medical problems.   This visit occurred during the SARS-CoV-2 public health emergency.  Safety protocols were in place, including screening questions prior to the visit, additional usage of staff PPE, and extensive cleaning of exam room while observing appropriate contact time as indicated for disinfecting solutions.

## 2019-02-05 NOTE — Patient Instructions (Addendum)
  Tests ordered today. Your results will be released to [place] (or called to you) after review.  If any changes need to be made, you will be notified at that same time.    Medications reviewed and updated.  Changes include :   none     Please followup in 6 months

## 2019-02-06 ENCOUNTER — Encounter: Payer: Self-pay | Admitting: Internal Medicine

## 2019-02-06 ENCOUNTER — Other Ambulatory Visit: Payer: Self-pay | Admitting: Family Medicine

## 2019-02-06 ENCOUNTER — Other Ambulatory Visit: Payer: Self-pay

## 2019-02-06 ENCOUNTER — Ambulatory Visit (INDEPENDENT_AMBULATORY_CARE_PROVIDER_SITE_OTHER): Payer: 59 | Admitting: Internal Medicine

## 2019-02-06 ENCOUNTER — Other Ambulatory Visit (INDEPENDENT_AMBULATORY_CARE_PROVIDER_SITE_OTHER): Payer: 59

## 2019-02-06 VITALS — BP 124/84 | HR 77 | Temp 98.1°F | Resp 16 | Ht 63.0 in | Wt 145.8 lb

## 2019-02-06 DIAGNOSIS — R739 Hyperglycemia, unspecified: Secondary | ICD-10-CM | POA: Diagnosis not present

## 2019-02-06 DIAGNOSIS — M2550 Pain in unspecified joint: Secondary | ICD-10-CM | POA: Diagnosis not present

## 2019-02-06 DIAGNOSIS — I10 Essential (primary) hypertension: Secondary | ICD-10-CM | POA: Diagnosis not present

## 2019-02-06 DIAGNOSIS — E039 Hypothyroidism, unspecified: Secondary | ICD-10-CM

## 2019-02-06 LAB — COMPREHENSIVE METABOLIC PANEL
ALT: 12 U/L (ref 0–35)
AST: 14 U/L (ref 0–37)
Albumin: 4.2 g/dL (ref 3.5–5.2)
Alkaline Phosphatase: 71 U/L (ref 39–117)
BUN: 18 mg/dL (ref 6–23)
CO2: 30 mEq/L (ref 19–32)
Calcium: 9.4 mg/dL (ref 8.4–10.5)
Chloride: 99 mEq/L (ref 96–112)
Creatinine, Ser: 0.74 mg/dL (ref 0.40–1.20)
GFR: 84.61 mL/min (ref >60.00)
Glucose, Bld: 92 mg/dL (ref 70–99)
Potassium: 3.4 mEq/L — ABNORMAL LOW (ref 3.5–5.1)
Sodium: 136 mEq/L (ref 135–145)
Total Bilirubin: 0.5 mg/dL (ref 0.2–1.2)
Total Protein: 7.1 g/dL (ref 6.0–8.3)

## 2019-02-06 LAB — CBC WITH DIFFERENTIAL/PLATELET
Basophils Absolute: 0.1 10*3/uL (ref 0.0–0.1)
Basophils Relative: 1 % (ref 0.0–3.0)
Eosinophils Absolute: 0.4 10*3/uL (ref 0.0–0.7)
Eosinophils Relative: 3.9 % (ref 0.0–5.0)
HCT: 41.7 % (ref 36.0–46.0)
Hemoglobin: 14 g/dL (ref 12.0–15.0)
Lymphocytes Relative: 38.8 % (ref 12.0–46.0)
Lymphs Abs: 3.5 10*3/uL (ref 0.7–4.0)
MCHC: 33.6 g/dL (ref 30.0–36.0)
MCV: 90.7 fl (ref 78.0–100.0)
Monocytes Absolute: 0.4 10*3/uL (ref 0.1–1.0)
Monocytes Relative: 4.5 % (ref 3.0–12.0)
Neutro Abs: 4.7 10*3/uL (ref 1.4–7.7)
Neutrophils Relative %: 51.8 % (ref 43.0–77.0)
Platelets: 336 10*3/uL (ref 150.0–400.0)
RBC: 4.59 Mil/uL (ref 3.87–5.11)
RDW: 12.7 % (ref 11.5–15.5)
WBC: 9.1 10*3/uL (ref 4.0–10.5)

## 2019-02-06 LAB — TSH: TSH: 1.63 u[IU]/mL (ref 0.35–4.50)

## 2019-02-06 LAB — HEMOGLOBIN A1C: Hgb A1c MFr Bld: 5.7 % (ref 4.6–6.5)

## 2019-02-06 MED FILL — LEVOTHYROXINE 50 MCG TABLET: 50 | 90 days supply | Qty: 90 | Fill #0

## 2019-02-06 NOTE — Assessment & Plan Note (Signed)
BP well controlled Current regimen effective and well tolerated Continue current medications at current doses cmp  

## 2019-02-06 NOTE — Assessment & Plan Note (Signed)
Diffuse joint pain Following with Dr Tamala Julian Has seen integrative doctor - will follow up Has seen rheum - not thought to be autoimmune Taking cymbalta, tylenol prn, advil prn Tramadol only as needed - maybe once a week Will try to go back to anti-inflammatory diet

## 2019-02-06 NOTE — Assessment & Plan Note (Signed)
Clinically euthyroid Check tsh  Titrate med dose if needed  

## 2019-02-06 NOTE — Assessment & Plan Note (Signed)
Check a1c Low sugar / carb diet Stressed regular exercise   

## 2019-02-08 ENCOUNTER — Ambulatory Visit (INDEPENDENT_AMBULATORY_CARE_PROVIDER_SITE_OTHER): Payer: 59 | Admitting: Family Medicine

## 2019-02-08 ENCOUNTER — Encounter: Payer: Self-pay | Admitting: Family Medicine

## 2019-02-08 ENCOUNTER — Other Ambulatory Visit: Payer: Self-pay

## 2019-02-08 VITALS — BP 110/70 | HR 94 | Ht 63.0 in | Wt 145.0 lb

## 2019-02-08 DIAGNOSIS — M5416 Radiculopathy, lumbar region: Secondary | ICD-10-CM | POA: Diagnosis not present

## 2019-02-08 DIAGNOSIS — M999 Biomechanical lesion, unspecified: Secondary | ICD-10-CM

## 2019-02-08 NOTE — Patient Instructions (Addendum)
      [address], [address] [place], [place] [address] Phone [phone]  Good to see you  New exercises for upper back and glutes Newtons or on clouds Have a great holiday  See me again in 5 weeks

## 2019-02-08 NOTE — Progress Notes (Signed)
[name] [place] [place], [place] [address] Phone: [phone] Subjective:   [name], am serving as a scribe for Dr. Hulan Saas.  I'm seeing this patient by the request  of:  Binnie Rail, MD   This visit occurred during the SARS-CoV-2 public health emergency.  Safety protocols were in place, including screening questions prior to the visit, additional usage of staff PPE, and extensive cleaning of exam room while observing appropriate contact time as indicated for disinfecting solutions.     CC: Multiple joint pain  EXB:[id number]   10/03/2018 Doing well.  Discussed the muscle imbalances.  Patient does not have any radicular symptoms.  Discussed which activities to do which wants to avoid patient wants to increase activity slowly.  Prednisone given for flare.  Follow-up again in 4 to 6 weeks  02/08/2019 [name] is a 43 y.o. female coming in with complaint of polyarthalgia. Patient states that she did take some prednisone which helped to decrease the intensity of her pain in the scapula and hips.    Patient states that it is more of an aching pain.  Still being worked up for other comorbidities and autoimmune disease.       Past Medical History:  Diagnosis Date  . Allergy   . Crohn's disease ([place])   . Endometriosis   . Hypertension   . Kidney, medullary sponge   . Ovarian cyst   . Pre-eclampsia   . Raynaud's disease   . Stress incontinence in female    Past Surgical History:  Procedure Laterality Date  . CHOLECYSTECTOMY    . DILATION AND CURETTAGE OF UTERUS    . ENDOMETRIAL ABLATION    . NOVASURE ABLATION    . TUBAL LIGATION     Social History   Socioeconomic History  . Marital status: Married    Spouse name: Not on file  . Number of children: 3  . Years of education: Not on file  . Highest education level: Not on file  Occupational History  . Occupation: Programmer, multimedia: [employer]   . Financial resource strain: Not on file  . Food insecurity    Worry: Not on file    Inability: Not on file  . Transportation needs    Medical: Not on file    Non-medical: Not on file  Tobacco Use  . Smoking status: Never Smoker  . Smokeless tobacco: Never Used  Substance and Sexual Activity  . Alcohol use: Yes  . Drug use: No  . Sexual activity: Yes  Lifestyle  . Physical activity    Days per week: Not on file    Minutes per session: Not on file  . Stress: Not on file  Relationships  . Social Herbalist on phone: Not on file    Gets together: Not on file    Attends religious service: Not on file    Active member of club or organization: Not on file    Attends meetings of clubs or organizations: Not on file    Relationship status: Not on file  Other Topics Concern  . 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 Lamisil [Terbinafine] Hives and Itching    In pill form   Family History  Problem Relation Age of Onset  . Breast cancer Mother   .  Hypertension Father   . Hyperlipidemia Father   . Other Brother        lyme-chronic  . Stroke Maternal Grandmother   . Alcohol abuse Paternal Grandmother   . Heart disease Paternal Grandfather   . Colon cancer Neg Hx   . Esophageal cancer Neg Hx   . Rectal cancer Neg Hx     Current Outpatient Medications (Endocrine & Metabolic):  .  levothyroxine (SYNTHROID) 50 MCG tablet, TAKE 1 TABLET (50 MCG TOTAL) BY MOUTH DAILY.  Current Outpatient Medications (Cardiovascular):  .  amLODipine (NORVASC) 5 MG tablet, TAKE 1 TABLET BY MOUTH DAILY. .  hydrochlorothiazide (HYDRODIURIL) 25 MG tablet, TAKE 1 TABLET BY MOUTH DAILY.  Current Outpatient Medications (Respiratory):  Marland Kitchen  Fexofenadine HCl (ALLEGRA PO), Take by mouth. .  fluticasone (FLONASE) 50 MCG/ACT nasal spray, Place 2 sprays into both nostrils daily.  Current Outpatient Medications  (Analgesics):  .  acetaminophen (TYLENOL) 650 MG CR tablet, Take 650 mg by mouth every 8 (eight) hours as needed for pain. .  traMADol (ULTRAM) 50 MG tablet, TAKE 1 TABLET (50 MG TOTAL) BY MOUTH EVERY 12 (TWELVE) HOURS AS NEEDED.   Current Outpatient Medications (Other):  Marland Kitchen  Cholecalciferol (VITAMIN D3) 2000 units TABS, Take 2,000 Units by mouth daily. .  DULoxetine (CYMBALTA) 60 MG capsule, TAKE 1 CAPSULE BY MOUTH DAILY. Marland Kitchen  gabapentin (NEURONTIN) 100 MG capsule, TAKE 2 CAPSULES BY MOUTH AT BEDTIME. Marland Kitchen  Misc Natural Products (CALCIUM PYRUVATE PO), Take by mouth. .  nitrofurantoin (MACRODANTIN) 50 MG capsule, TAKE AFTER INTERCOURSE .  Prasterone, DHEA, (DHEA ADVANCED FORMULA PO), Take 5 mg by mouth.    Past medical history, social, surgical and family history all reviewed in electronic medical record.  No pertanent information unless stated regarding to the chief complaint.   Review of Systems:  No headache, visual changes, nausea, vomiting, diarrhea, constipation, dizziness, abdominal pain, skin rash, fevers, chills, night sweats, weight loss, swollen lymph nodes, body aches, joint swelling,  chest pain, shortness of breath, mood changes.  Positive muscle aches  Objective  Blood pressure 110/70, pulse 94, height 5' 3"  (1.6 m), weight 145 lb (65.8 kg), SpO2 99 %.   General: No apparent distress alert and oriented x3 mood and affect normal, dressed appropriately.  HEENT: Pupils equal, extraocular movements intact  Respiratory: Patient's speak in full sentences and does not appear short of breath  Cardiovascular: No lower extremity edema, non tender, no erythema  Skin: Warm dry intact with no signs of infection or rash on extremities or on axial skeleton.  Abdomen: Soft nontender  Neuro: Cranial nerves II through XII are intact, neurovascularly intact in all extremities with 2+ DTRs and 2+ pulses.  Lymph: No lymphadenopathy of posterior or anterior cervical chain or axillae bilaterally.   Gait normal with good balance and coordination.  MSK:  tender with full range of motion and good stability and symmetric strength and tone of shoulders, elbows, wrist, hip, knee and ankles bilaterally.  Patient has diffuse tenderness of multiple joints.  No synovitis noted today.  Patient does have some limited range of motion lacking last 5 degrees of flexion and extension of the lumbar spine.  Patient also has some tightness in the neck but negative Spurling's.  Deep tendon reflexes intact in all extremities.  Neurovascularly intact in all extremities.  Osteopathic findings  C2 flexed rotated and side bent right C4 flexed rotated and side bent left C6 flexed rotated and side bent left T3 extended rotated and side  bent right inhaled third rib T9 extended rotated and side bent left L2 flexed rotated and side bent right Sacrum right on right Pelvic shear noted   Impression and Recommendations:     This case required medical decision making of moderate complexity. The above documentation has been reviewed and is accurate and complete Lorraine Pulley, DO       Note: This dictation was prepared with Dragon dictation along with smaller phrase technology. Any transcriptional errors that result from this process are unintentional.

## 2019-02-09 ENCOUNTER — Encounter: Payer: Self-pay | Admitting: Internal Medicine

## 2019-02-09 ENCOUNTER — Encounter: Payer: Self-pay | Admitting: Family Medicine

## 2019-02-09 DIAGNOSIS — R7303 Prediabetes: Secondary | ICD-10-CM | POA: Insufficient documentation

## 2019-02-09 NOTE — Assessment & Plan Note (Signed)
Patient's pain is multifactorial, has been many years with this pain.  Not making any significant headway.  Patient has times when she seems to do better when she is on specific diets.  We discussed some of the diets and changes that could be more beneficial.  Discussed ergonomics throughout the day.  Follow-up again in 4 to 8 weeks

## 2019-02-09 NOTE — Assessment & Plan Note (Signed)
Decision today to treat with OMT was based on Physical Exam  After verbal consent patient was treated with HVLA, ME, FPR techniques in cervical, thoracic, rib, pelvis, lumbar and sacral areas  Patient tolerated the procedure well with improvement in symptoms  Patient given exercises, stretches and lifestyle modifications  See medications in patient instructions if given  Patient will follow up in 4-8 weeks

## 2019-02-14 ENCOUNTER — Encounter: Payer: Self-pay | Admitting: Internal Medicine

## 2019-02-14 MED ORDER — POTASSIUM CHLORIDE CRYS ER 20 MEQ PO TBCR: 20.0000 meq | EXTENDED_RELEASE_TABLET | Freq: Every day | ORAL | 3 refills | Status: DC

## 2019-02-14 MED FILL — POTASSIUM CHLORIDE CRYS ER: 20 | 30 days supply | Qty: 30 | Fill #0

## 2019-03-07 ENCOUNTER — Other Ambulatory Visit: Payer: Self-pay | Admitting: Internal Medicine

## 2019-03-07 ENCOUNTER — Encounter: Payer: Self-pay | Admitting: Internal Medicine

## 2019-03-07 MED ORDER — AMLODIPINE BESYLATE 5 MG PO TABS: 5.0000 mg | ORAL_TABLET | Freq: Every day | ORAL | 1 refills | Status: DC

## 2019-03-07 MED FILL — AMLODIPINE BESYLATE 5 MG TA: 5 | 90 days supply | Qty: 90 | Fill #0

## 2019-03-16 ENCOUNTER — Other Ambulatory Visit: Payer: Self-pay

## 2019-03-16 ENCOUNTER — Encounter: Payer: Self-pay | Admitting: Family Medicine

## 2019-03-16 ENCOUNTER — Ambulatory Visit (INDEPENDENT_AMBULATORY_CARE_PROVIDER_SITE_OTHER): Payer: 59 | Admitting: Family Medicine

## 2019-03-16 ENCOUNTER — Other Ambulatory Visit: Payer: Self-pay | Admitting: Internal Medicine

## 2019-03-16 VITALS — BP 110/84 | HR 89 | Ht 63.0 in | Wt 146.0 lb

## 2019-03-16 DIAGNOSIS — M999 Biomechanical lesion, unspecified: Secondary | ICD-10-CM

## 2019-03-16 DIAGNOSIS — M2550 Pain in unspecified joint: Secondary | ICD-10-CM

## 2019-03-16 DIAGNOSIS — R251 Tremor, unspecified: Secondary | ICD-10-CM

## 2019-03-16 MED FILL — HYDROCHLOROTHIAZIDE 25 MG T: 25 | 90 days supply | Qty: 90 | Fill #0

## 2019-03-16 NOTE — Progress Notes (Signed)
[facility] [address] [place] Phone: [phone] Subjective:   I [name] am serving as a Education administrator for Dr. Hulan Saas.  This visit occurred during the SARS-CoV-2 public health emergency.  Safety protocols were in place, including screening questions prior to the visit, additional usage of staff PPE, and extensive cleaning of exam room while observing appropriate contact time as indicated for disinfecting solutions.   I'm seeing this patient by the request  of:    CC: Multiple joint planes  EXH:[id number]   02/08/2019 Patient's pain is multifactorial, has been many years with this pain.  Not making any significant headway.  Patient has times when she seems to do better when she is on specific diets.  We discussed some of the diets and changes that could be more beneficial.  Discussed ergonomics throughout the day.  Follow-up again in 4 to 8 weeks  03/15/2018 [name] is a 44 y.o. female coming in with complaint of back pain. Patient states that her back is "swollen". Has a lot of inflammatory symptoms as well. Patient feels like she is having some type of flare.  Has had many different complaints over the course of time and now seems to be more secondary to her back.  Has noticed also though a new symptom.  Having more of a tremor of the upper extremity.  States that there is sometimes intermittent numbness with it as well.  States that it seems to be worse with fine motor control.  Patient notices when holding a pencil seems to fatigue relatively quickly within minutes.  Patient states that then she has declined to move her hand until she starts feeling like it is regular again.  Sometimes can take several minutes.  Can be associated with neck pain.  Sometimes feels like her hands are swollen.    Past Medical History:  Diagnosis Date  . Allergy   . Crohn's disease ([place])   . Endometriosis   . Hypertension   . Kidney,  medullary sponge   . Ovarian cyst   . Pre-eclampsia   . Raynaud's disease   . Stress incontinence in female    Past Surgical History:  Procedure Laterality Date  . CHOLECYSTECTOMY    . DILATION AND CURETTAGE OF UTERUS    . ENDOMETRIAL ABLATION    . NOVASURE ABLATION    . TUBAL LIGATION     Social History   Socioeconomic History  . Marital status: Married    Spouse name: Not on file  . Number of children: 3  . Years of education: Not on file  . Highest education level: Not on file  Occupational History  . Occupation: Programmer, multimedia: [employer]  Tobacco Use  . Smoking status: Never Smoker  . Smokeless tobacco: Never Used  Substance and Sexual Activity  . Alcohol use: Yes  . Drug use: No  . Sexual activity: Yes  Other Topics Concern  . 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 Difficulty of Paying Living Expenses: Not on file  Food Insecurity:   . Worried About Charity fundraiser in the Last Year: Not on file  . Ran Out of Food in the Last Year: Not on file  Transportation Needs:   . Lack of Transportation (Medical): Not on file  . Lack  of Transportation (Non-Medical): Not on file  Physical Activity:   . Days of Exercise per Week: Not on file  . Minutes of Exercise per Session: Not on file  Stress:   . Feeling of Stress : Not on file  Social Connections:   . Frequency of Communication with Friends and Family: Not on file  . Frequency of Social Gatherings with Friends and Family: Not on file  . Attends Religious Services: Not on file  . Active Member of Clubs or Organizations: Not on file  . Attends Archivist Meetings: Not on file  . Marital Status: Not on file   Allergies  Allergen Reactions  . Lamisil [Terbinafine] Hives and Itching    In pill form   Family History  Problem Relation Age of Onset  . Breast cancer Mother   .  Hypertension Father   . Hyperlipidemia Father   . Other Brother        lyme-chronic  . Stroke Maternal Grandmother   . Alcohol abuse Paternal Grandmother   . Heart disease Paternal Grandfather   . Colon cancer Neg Hx   . Esophageal cancer Neg Hx   . Rectal cancer Neg Hx     Current Outpatient Medications (Endocrine & Metabolic):  .  levothyroxine (SYNTHROID) 50 MCG tablet, TAKE 1 TABLET (50 MCG TOTAL) BY MOUTH DAILY.  Current Outpatient Medications (Cardiovascular):  .  amLODipine (NORVASC) 5 MG tablet, Take 1 tablet (5 mg total) by mouth daily. .  hydrochlorothiazide (HYDRODIURIL) 25 MG tablet, TAKE 1 TABLET BY MOUTH DAILY.  Current Outpatient Medications (Respiratory):  Marland Kitchen  Fexofenadine HCl (ALLEGRA PO), Take by mouth. .  fluticasone (FLONASE) 50 MCG/ACT nasal spray, Place 2 sprays into both nostrils daily.  Current Outpatient Medications (Analgesics):  .  acetaminophen (TYLENOL) 650 MG CR tablet, Take 650 mg by mouth every 8 (eight) hours as needed for pain. .  traMADol (ULTRAM) 50 MG tablet, TAKE 1 TABLET (50 MG TOTAL) BY MOUTH EVERY 12 (TWELVE) HOURS AS NEEDED.   Current Outpatient Medications (Other):  Marland Kitchen  Cholecalciferol (VITAMIN D3) 2000 units TABS, Take 2,000 Units by mouth daily. .  DULoxetine (CYMBALTA) 60 MG capsule, TAKE 1 CAPSULE BY MOUTH DAILY. Marland Kitchen  gabapentin (NEURONTIN) 100 MG capsule, TAKE 2 CAPSULES BY MOUTH AT BEDTIME. Marland Kitchen  Misc Natural Products (CALCIUM PYRUVATE PO), Take by mouth. .  nitrofurantoin (MACRODANTIN) 50 MG capsule, TAKE AFTER INTERCOURSE .  potassium chloride SA (KLOR-CON) 20 MEQ tablet, Take 1 tablet (20 mEq total) by mouth daily. .  Prasterone, DHEA, (DHEA ADVANCED FORMULA PO), Take 5 mg by mouth.    Past medical history, social, surgical and family history all reviewed in electronic medical record.  No pertanent information unless stated regarding to the chief complaint.   Review of Systems:  No headache, visual changes, nausea, vomiting,  diarrhea, constipation, dizziness, abdominal pain, skin rash, fevers, chills, night sweats, weight loss, swollen lymph nodes, body aches, joint swelling,  chest pain, shortness of breath, mood changes.  Positive muscle aches  Objective  Blood pressure 110/84, pulse 89, height 5' 3"  (1.6 m), weight 146 lb (66.2 kg), SpO2 98 %.    General: No apparent distress alert and oriented x3 mood and affect normal, dressed appropriately.  HEENT: Pupils equal, extraocular movements intact  Respiratory: Patient's speak in full sentences and does not appear short of breath  Cardiovascular: No lower extremity edema, non tender, no erythema  Skin: Warm dry intact with no signs of infection or rash on  extremities or on axial skeleton.  Abdomen: Soft nontender  Neuro: Cranial nerves II through XII are intact, neurovascularly intact in all extremities with 2+ DTRs and 2+ pulses.  Lymph: No lymphadenopathy of posterior or anterior cervical chain or axillae bilaterally.  Gait normal with good balance and coordination.  MSK:  tender with full range of motion and good stability and symmetric strength and tone of shoulders, elbows, wrist, hip, knee and ankles bilaterally.  With movement did not see any true tremor noted as well and no tremor on initiation. Back exam does have some loss of lordosis.  Patient does have some tenderness noted in the paraspinal musculature more around the sacroiliac joints bilaterally. Back exam does have some mild loss of lordosis.  Negative Spurling's but does have more pain with it.  Tightness noted in the parascapular region bilaterally.  Grip strength is does appear to be intact at the moment.  Negative Tinel's sign of the wrist.  Osteopathic findings  T8 extended rotated and side bent right inhaled rib L2 flexed rotated and side bent right Sacrum right on right Pelvic shear noted right    Impression and Recommendations:     This case required medical decision making of  moderate complexity. The above documentation has been reviewed and is accurate and complete Lyndal Pulley, DO       Note: This dictation was prepared with Dragon dictation along with smaller phrase technology. Any transcriptional errors that result from this process are unintentional.

## 2019-03-16 NOTE — Assessment & Plan Note (Signed)
Decision today to treat with OMT was based on Physical Exam  After verbal consent patient was treated with HVLA, ME, FPR techniques in cervical, thoracic, rib lumbar and pelvis  sacral areas  Patient tolerated the procedure well with improvement in symptoms  Patient given exercises, stretches and lifestyle modifications  See medications in patient instructions if given  Patient will follow up in 4-8 weeks [place]

## 2019-03-16 NOTE — Assessment & Plan Note (Signed)
Patient appears to be having more of a tremor as well as upper extremity fatigue right greater than left.  Patient has had significant difficulty and been very difficult to diagnose different problems.  At this time I do feel that nerve conduction study may be helpful to rule out such things as carpal tunnel or possible cervical radiculopathy.  Patient has had significant amount of muscle aches and pains that are out of the ordinary and has had a positive ANA and will consider the possibility of referral to neurology for further evaluation to make sure we are not missing any other neurologic condition that could be contributing.  Patient will continue conservative therapy otherwise continue sameMedications.  Spent  25 minutes with patient face-to-face and had greater than 50% of counseling including as described above in assessment and plan.

## 2019-03-16 NOTE — Patient Instructions (Addendum)
Continue everything else No change in medicine See me again in 5-6 weeks

## 2019-03-18 MED FILL — DULOXETINE HCL 60 MG CPEP: 60 | 90 days supply | Qty: 90 | Fill #3

## 2019-03-22 ENCOUNTER — Encounter: Payer: Self-pay | Admitting: Neurology

## 2019-03-22 ENCOUNTER — Other Ambulatory Visit: Payer: Self-pay

## 2019-03-22 DIAGNOSIS — M2550 Pain in unspecified joint: Secondary | ICD-10-CM

## 2019-03-27 MED FILL — DULOXETINE HCL 60 MG CPEP: 60 | 90 days supply | Qty: 90 | Fill #3

## 2019-03-27 MED FILL — HYDROCHLOROTHIAZIDE 25 MG T: 25 | 90 days supply | Qty: 90 | Fill #0

## 2019-04-10 ENCOUNTER — Encounter: Payer: Self-pay | Admitting: Internal Medicine

## 2019-04-10 MED FILL — NITROFURANTOIN MCR 50 MG CA: 50 | 45 days supply | Qty: 45 | Fill #0

## 2019-04-11 ENCOUNTER — Other Ambulatory Visit: Payer: Self-pay

## 2019-04-11 MED ORDER — NITROFURANTOIN MACROCRYSTAL 50 MG PO CAPS: ORAL_CAPSULE | ORAL | 0 refills | Status: DC

## 2019-04-12 ENCOUNTER — Other Ambulatory Visit: Payer: Self-pay

## 2019-04-12 ENCOUNTER — Ambulatory Visit (INDEPENDENT_AMBULATORY_CARE_PROVIDER_SITE_OTHER): Payer: 59 | Admitting: Neurology

## 2019-04-12 DIAGNOSIS — M2550 Pain in unspecified joint: Secondary | ICD-10-CM

## 2019-04-12 DIAGNOSIS — M79601 Pain in right arm: Secondary | ICD-10-CM

## 2019-04-12 NOTE — Procedures (Signed)
[facility]  [place]  [place], [place] [address] Tel: [phone] Fax:  [phone] Test Date:  04/12/2019  Patient: [name] DOB: Aug 27, 1975 Physician: Narda Amber, DO  Sex: Female Height: 5' 3"  Ref Phys: Hulan Saas, DO  ID#: [id number] Temp: 33.0C Technician:    Patient Complaints: This is a 44 year old female referred for evaluation of bilateral arm pain.  NCV & EMG Findings: Extensive electrodiagnostic testing of the right upper extremity and additional studies of the left shows:  1. Bilateral median, ulnar, and mixed palmar sensory responses are within normal limits. 2. Bilateral median and ulnar motor responses are within normal limits. 3. There is no evidence of active or chronic motor axonal loss changes affecting any of the tested muscles.  Motor unit configuration and recruitment pattern is within normal limits.  Impression: This is a normal study of the upper extremities.  In particular, there is no evidence of carpal tunnel syndrome, ulnar neuropathy, or a cervical radiculopathy.   ___________________________ Narda Amber, DO    Nerve Conduction Studies Anti Sensory Summary Table   Site NR Peak (ms) Norm Peak (ms) P-T Amp (V) Norm P-T Amp  Left Median Anti Sensory (2nd Digit)  33C  Wrist    3.0 <3.4 48.2 >20  Right Median Anti Sensory (2nd Digit)  33C  Wrist    3.0 <3.4 47.2 >20  Left Ulnar Anti Sensory (5th Digit)  33C  Wrist    2.4 <3.1 52.8 >12  Right Ulnar Anti Sensory (5th Digit)  33C  Wrist    2.4 <3.1 59.6 >12   Motor Summary Table   Site NR Onset (ms) Norm Onset (ms) O-P Amp (mV) Norm O-P Amp Site1 Site2 Delta-0 (ms) Dist (cm) Vel (m/s) Norm Vel (m/s)  Left Median Motor (Abd Poll Brev)  33C  Wrist    3.0 <3.9 11.6 >6 Elbow Wrist 4.1 28.0 68 >50  Elbow    7.1  10.8         Right Median Motor (Abd Poll Brev)  33C  Wrist    3.0 <3.9 11.8 >6 Elbow Wrist 4.2 28.0 67 >50  Elbow    7.2  11.3           Left Ulnar Motor (Abd Dig Minimi)  33C  Wrist    2.0 <3.1 8.8 >7 B Elbow Wrist 3.3 22.0 67 >50  B Elbow    5.3  8.7  A Elbow B Elbow 1.6 10.0 62 >50  A Elbow    6.9  8.5         Right Ulnar Motor (Abd Dig Minimi)  33C  Wrist    1.7 <3.1 8.4 >7 B Elbow Wrist 3.5 22.0 63 >50  B Elbow    5.2  7.7  A Elbow B Elbow 1.6 10.0 63 >50  A Elbow    6.8  7.3          Comparison Summary Table   Site NR Peak (ms) Norm Peak (ms) P-T Amp (V) Site1 Site2 Delta-P (ms) Norm Delta (ms)  Left Median/Ulnar Palm Comparison (Wrist - 8cm)  33C  Median Palm    1.9 <2.2 62.5 Median Palm Ulnar Palm 0.3   Ulnar Palm    1.6 <2.2 23.0      Right Median/Ulnar Palm Comparison (Wrist - 8cm)  33C  Median Palm    2.0 <2.2 47.0 Median Palm Ulnar Palm 0.0   Ulnar Palm    2.0 <2.2 17.9  EMG   Side Muscle Ins Act Fibs Psw Fasc Number Recrt Dur Dur. Amp Amp. Poly Poly. Comment  Right 1stDorInt Nml Nml Nml Nml Nml Nml Nml Nml Nml Nml Nml Nml N/A  Right PronatorTeres Nml Nml Nml Nml Nml Nml Nml Nml Nml Nml Nml Nml N/A  Right Biceps Nml Nml Nml Nml Nml Nml Nml Nml Nml Nml Nml Nml N/A  Right Triceps Nml Nml Nml Nml Nml Nml Nml Nml Nml Nml Nml Nml N/A  Right Deltoid Nml Nml Nml Nml Nml Nml Nml Nml Nml Nml Nml Nml N/A  Left 1stDorInt Nml Nml Nml Nml Nml Nml Nml Nml Nml Nml Nml Nml N/A  Left PronatorTeres Nml Nml Nml Nml Nml Nml Nml Nml Nml Nml Nml Nml N/A  Left Biceps Nml Nml Nml Nml Nml Nml Nml Nml Nml Nml Nml Nml N/A  Left Triceps Nml Nml Nml Nml Nml Nml Nml Nml Nml Nml Nml Nml N/A  Left Deltoid Nml Nml Nml Nml Nml Nml Nml Nml Nml Nml Nml Nml N/A      Waveforms:

## 2019-04-20 ENCOUNTER — Ambulatory Visit: Payer: 59 | Admitting: Family Medicine

## 2019-04-20 ENCOUNTER — Other Ambulatory Visit: Payer: Self-pay

## 2019-04-20 ENCOUNTER — Encounter: Payer: Self-pay | Admitting: Family Medicine

## 2019-04-20 VITALS — BP 100/72 | HR 80 | Ht 63.0 in | Wt 146.0 lb

## 2019-04-20 DIAGNOSIS — M2550 Pain in unspecified joint: Secondary | ICD-10-CM

## 2019-04-20 DIAGNOSIS — M999 Biomechanical lesion, unspecified: Secondary | ICD-10-CM

## 2019-04-20 MED ORDER — DULOXETINE HCL 30 MG PO CPEP: ORAL_CAPSULE | ORAL | 3 refills | Status: DC

## 2019-04-20 MED FILL — DULoxetine HCL 30 MG CPEP: 30 | 10 days supply | Qty: 30 | Fill #0

## 2019-04-20 NOTE — Progress Notes (Signed)
[facility] [address] [place] Phone: [phone] Subjective:   I [name] am serving as a Education administrator for Dr. Hulan Saas.  This visit occurred during the SARS-CoV-2 public health emergency.  Safety protocols were in place, including screening questions prior to the visit, additional usage of staff PPE, and extensive cleaning of exam room while observing appropriate contact time as indicated for disinfecting solutions.   I'm seeing this patient by the request  of:  Binnie Rail, MD  CC: Neck pain follow-up all over pain follow-up  OZD:[id number]  Samary Siobahn Worsley is a 44 y.o. female coming in with complaint of neck and back pain. Last seen on 03/16/2019 for OMT. Patient states she has a lot of inflammation. Patient has been making some progress.  Does feel the Cymbalta helps.  Is working with a trainer 2 times a week.  Feels that it is a balancing act from doing too much activity too to the last.  Still though with pain that she feels is out of proportion.     Past Medical History:  Diagnosis Date  . Allergy   . Crohn's disease ([place])   . Endometriosis   . Hypertension   . Kidney, medullary sponge   . Ovarian cyst   . Pre-eclampsia   . Raynaud's disease   . Stress incontinence in female    Past Surgical History:  Procedure Laterality Date  . CHOLECYSTECTOMY    . DILATION AND CURETTAGE OF UTERUS    . ENDOMETRIAL ABLATION    . NOVASURE ABLATION    . TUBAL LIGATION     Social History   Socioeconomic History  . Marital status: Married    Spouse name: Not on file  . Number of children: 3  . Years of education: Not on file  . Highest education level: Not on file  Occupational History  . Occupation: Programmer, multimedia: [employer]  Tobacco Use  . Smoking status: Never Smoker  . Smokeless tobacco: Never Used  Substance and Sexual Activity  . Alcohol use: Yes  . Drug use: No  . Sexual activity: Yes  Other Topics  Concern  . 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 Difficulty of Paying Living Expenses: Not on file  Food Insecurity:   . Worried About Charity fundraiser in the Last Year: Not on file  . Ran Out of Food in the Last Year: Not on file  Transportation Needs:   . Lack of Transportation (Medical): Not on file  . Lack of Transportation (Non-Medical): Not on file  Physical Activity:   . Days of Exercise per Week: Not on file  . Minutes of Exercise per Session: Not on file  Stress:   . Feeling of Stress : Not on file  Social Connections:   . Frequency of Communication with Friends and Family: Not on file  . Frequency of Social Gatherings with Friends and Family: Not on file  . Attends Religious Services: Not on file  . Active Member of Clubs or Organizations: Not on file  . Attends Archivist Meetings: Not on file  . Marital Status: Not on file   Allergies  Allergen Reactions  . Lamisil [Terbinafine] Hives and Itching    In pill form   Family History  Problem Relation Age  of Onset  . Breast cancer Mother   . Hypertension Father   . Hyperlipidemia Father   . Other Brother        lyme-chronic  . Stroke Maternal Grandmother   . Alcohol abuse Paternal Grandmother   . Heart disease Paternal Grandfather   . Colon cancer Neg Hx   . Esophageal cancer Neg Hx   . Rectal cancer Neg Hx     Current Outpatient Medications (Endocrine & Metabolic):  .  levothyroxine (SYNTHROID) 50 MCG tablet, TAKE 1 TABLET (50 MCG TOTAL) BY MOUTH DAILY.  Current Outpatient Medications (Cardiovascular):  .  amLODipine (NORVASC) 5 MG tablet, Take 1 tablet (5 mg total) by mouth daily. .  hydrochlorothiazide (HYDRODIURIL) 25 MG tablet, TAKE 1 TABLET BY MOUTH DAILY.  Current Outpatient Medications (Respiratory):  Marland Kitchen  Fexofenadine HCl (ALLEGRA PO), Take by mouth. .  fluticasone  (FLONASE) 50 MCG/ACT nasal spray, Place 2 sprays into both nostrils daily.  Current Outpatient Medications (Analgesics):  .  acetaminophen (TYLENOL) 650 MG CR tablet, Take 650 mg by mouth every 8 (eight) hours as needed for pain. .  traMADol (ULTRAM) 50 MG tablet, TAKE 1 TABLET (50 MG TOTAL) BY MOUTH EVERY 12 (TWELVE) HOURS AS NEEDED.   Current Outpatient Medications (Other):  Marland Kitchen  Cholecalciferol (VITAMIN D3) 2000 units TABS, Take 2,000 Units by mouth daily. .  DULoxetine (CYMBALTA) 60 MG capsule, TAKE 1 CAPSULE BY MOUTH DAILY. Marland Kitchen  gabapentin (NEURONTIN) 100 MG capsule, TAKE 2 CAPSULES BY MOUTH AT BEDTIME. Marland Kitchen  Misc Natural Products (CALCIUM PYRUVATE PO), Take by mouth. .  nitrofurantoin (MACRODANTIN) 50 MG capsule, TAKE AFTER INTERCOURSE .  potassium chloride SA (KLOR-CON) 20 MEQ tablet, Take 1 tablet (20 mEq total) by mouth daily. .  Prasterone, DHEA, (DHEA ADVANCED FORMULA PO), Take 5 mg by mouth. .  DULoxetine (CYMBALTA) 30 MG capsule, 3 pills 1 time a day   Reviewed prior external information including notes and imaging from  primary care provider As well as notes that were available from care everywhere and other healthcare systems.  Past medical history, social, surgical and family history all reviewed in electronic medical record.  No pertanent information unless stated regarding to the chief complaint.   Review of Systems:  No , visual changes, nausea, vomiting, diarrhea, constipation, dizziness, abdominal pain, skin rash, fevers, chills, night sweats, weight loss, swollen lymph nodes,  joint swelling, chest pain, shortness of breath, mood changes. POSITIVE muscle aches, body aches, headaches  Objective  Blood pressure 100/72, pulse 80, height 5' 3"  (1.6 m), weight 146 lb (66.2 kg), SpO2 96 %.   General: No apparent distress alert and oriented x3 mood and affect normal, dressed appropriately.  HEENT: Pupils equal, extraocular movements intact  Respiratory: Patient's speak in full  sentences and does not appear short of breath  Cardiovascular: No lower extremity edema, non tender, no erythema  Skin: Warm dry intact with no signs of infection or rash on extremities or on axial skeleton.  Abdomen: Soft nontender  Neuro: Cranial nerves II through XII are intact, neurovascularly intact in all extremities with 2+ DTRs and 2+ pulses.  Lymph: No lymphadenopathy of posterior or anterior cervical chain or axillae bilaterally.  Gait normal with good balance and coordination.  MSK:  tender with full range of motion and good stability and symmetric strength and tone of shoulders, elbows, wrist, hip, knee and ankles bilaterally.  Back exam does have some mild loss of lordosis tightness with Corky Sox.  Negative straight leg test but  does have tightness in the hamstrings bilaterally. Patient's neck exam does have some mild loss of lordosis.  Some tightness in the paraspinal musculature lumbar spine right greater than left.  Osteopathic findings  C6 flexed rotated and side bent left T3 extended rotated and side bent right inhaled third rib T6 extended rotated and side bent left L2 flexed rotated and side bent right Sacrum right on right Right pelvic shear noted    Impression and Recommendations:     This case required medical decision making of moderate complexity. The above documentation has been reviewed and is accurate and complete Lyndal Pulley, DO       Note: This dictation was prepared with Dragon dictation along with smaller phrase technology. Any transcriptional errors that result from this process are unintentional.

## 2019-04-20 NOTE — Assessment & Plan Note (Addendum)
Decision today to treat with OMT was based on Physical Exam  After verbal consent patient was treated with HVLA, ME, FPR techniques in pelvis, thoracic, rib, lumbar and sacral areas  Patient tolerated the procedure well with improvement in symptoms  Patient given exercises, stretches and lifestyle modifications  See medications in patient instructions if given  Patient will follow up in 4-8 weeks

## 2019-04-20 NOTE — Patient Instructions (Addendum)
Good to see you Sent in cymbalta See me again in 6-7 weeks

## 2019-04-20 NOTE — Assessment & Plan Note (Signed)
Continue to have difficulty with patient's pain.  Has been responding fairly well to osteopathic manipulation, increase patient's Cymbalta to 90 mg and see if this would potentially help.  Discussed which activities to do which wants to avoid.  Patient should increase activity slowly over the course the next several weeks.  Discussed icing regimen.  Follow-up again in 4 to 8 weeks

## 2019-05-02 ENCOUNTER — Other Ambulatory Visit: Payer: Self-pay | Admitting: Family Medicine

## 2019-05-03 MED FILL — LEVOTHYROXINE 50 MCG TABLET: 50 | 90 days supply | Qty: 90 | Fill #0

## 2019-05-22 MED FILL — DULoxetine HCL 30 MG CPEP: 30 | 30 days supply | Qty: 90 | Fill #1

## 2019-05-29 MED FILL — AMLODIPINE BESYLATE 5 MG TA: 5 | 90 days supply | Qty: 90 | Fill #1

## 2019-05-30 ENCOUNTER — Encounter: Payer: Self-pay | Admitting: Family Medicine

## 2019-05-31 ENCOUNTER — Other Ambulatory Visit: Payer: Self-pay

## 2019-05-31 MED ORDER — PREDNISONE 50 MG PO TABS: ORAL_TABLET | ORAL | 0 refills | Status: DC

## 2019-05-31 MED FILL — predniSONE 50 MG TABS: 50 | 5 days supply | Qty: 5 | Fill #0

## 2019-06-05 ENCOUNTER — Other Ambulatory Visit: Payer: Self-pay

## 2019-06-05 ENCOUNTER — Encounter (HOSPITAL_COMMUNITY): Payer: Self-pay

## 2019-06-05 ENCOUNTER — Emergency Department (HOSPITAL_COMMUNITY)
Admission: EM | Admit: 2019-06-05 | Discharge: 2019-06-05 | Disposition: A | Discharge status: Home / Self Care | Payer: 59 | Attending: Emergency Medicine | Admitting: Emergency Medicine

## 2019-06-05 ENCOUNTER — Emergency Department (HOSPITAL_COMMUNITY): Payer: 59

## 2019-06-05 DIAGNOSIS — Z79899 Other long term (current) drug therapy: Secondary | ICD-10-CM | POA: Insufficient documentation

## 2019-06-05 DIAGNOSIS — R112 Nausea with vomiting, unspecified: Secondary | ICD-10-CM | POA: Diagnosis not present

## 2019-06-05 DIAGNOSIS — I10 Essential (primary) hypertension: Secondary | ICD-10-CM | POA: Insufficient documentation

## 2019-06-05 DIAGNOSIS — R1110 Vomiting, unspecified: Secondary | ICD-10-CM | POA: Diagnosis not present

## 2019-06-05 DIAGNOSIS — R1010 Upper abdominal pain, unspecified: Secondary | ICD-10-CM | POA: Insufficient documentation

## 2019-06-05 DIAGNOSIS — K5901 Slow transit constipation: Secondary | ICD-10-CM | POA: Insufficient documentation

## 2019-06-05 HISTORY — DX: Other specified disorders involving the immune mechanism, not elsewhere classified: D89.89

## 2019-06-05 LAB — URINALYSIS, ROUTINE W REFLEX MICROSCOPIC
Bilirubin Urine: NEGATIVE
Glucose, UA: NEGATIVE mg/dL
Hgb urine dipstick: NEGATIVE
Ketones, ur: NEGATIVE mg/dL
Leukocytes,Ua: NEGATIVE
Nitrite: NEGATIVE
Protein, ur: NEGATIVE mg/dL
Specific Gravity, Urine: 1.013 (ref 1.005–1.030)
pH: 6 (ref 5.0–8.0)

## 2019-06-05 LAB — COMPREHENSIVE METABOLIC PANEL
ALT: 18 U/L (ref 0–44)
AST: 17 U/L (ref 15–41)
Albumin: 4.3 g/dL (ref 3.5–5.0)
Alkaline Phosphatase: 74 U/L (ref 38–126)
Anion gap: 12 (ref 5–15)
BUN: 25 mg/dL — ABNORMAL HIGH (ref 6–20)
CO2: 26 mmol/L (ref 22–32)
Calcium: 8.9 mg/dL (ref 8.9–10.3)
Chloride: 104 mmol/L (ref 98–111)
Creatinine, Ser: 0.8 mg/dL (ref 0.44–1.00)
GFR calc Af Amer: >60 mL/min (ref >60)
GFR calc non Af Amer: >60 mL/min (ref >60)
Glucose, Bld: 115 mg/dL — ABNORMAL HIGH (ref 70–99)
Potassium: 3.5 mmol/L (ref 3.5–5.1)
Sodium: 142 mmol/L (ref 135–145)
Total Bilirubin: 0.4 mg/dL (ref 0.3–1.2)
Total Protein: 7 g/dL (ref 6.5–8.1)

## 2019-06-05 LAB — RAPID URINE DRUG SCREEN, HOSP PERFORMED
Amphetamines: NOT DETECTED
Barbiturates: NOT DETECTED
Benzodiazepines: NOT DETECTED
Cocaine: NOT DETECTED
Opiates: NOT DETECTED
Tetrahydrocannabinol: NOT DETECTED

## 2019-06-05 LAB — CBC
HCT: 46.1 % — ABNORMAL HIGH (ref 36.0–46.0)
Hemoglobin: 15.3 g/dL — ABNORMAL HIGH (ref 12.0–15.0)
MCH: 31.1 pg (ref 26.0–34.0)
MCHC: 33.2 g/dL (ref 30.0–36.0)
MCV: 93.7 fL (ref 80.0–100.0)
Platelets: 346 10*3/uL (ref 150–400)
RBC: 4.92 MIL/uL (ref 3.87–5.11)
RDW: 12.6 % (ref 11.5–15.5)
WBC: 21.9 10*3/uL — ABNORMAL HIGH (ref 4.0–10.5)
nRBC: 0 % (ref 0.0–0.2)

## 2019-06-05 LAB — ETHANOL: Alcohol, Ethyl (B): <10 mg/dL (ref <10)

## 2019-06-05 LAB — LIPASE, BLOOD: Lipase: 20 U/L (ref 11–51)

## 2019-06-05 MED ORDER — ACETAMINOPHEN 325 MG PO TABS
650.0000 mg | ORAL_TABLET | Freq: Once | ORAL | Status: AC
  Administered 2019-06-05: 650 mg via ORAL
  Filled 2019-06-05: qty 2

## 2019-06-05 MED ORDER — ONDANSETRON HCL 4 MG/2ML IJ SOLN
4.0000 mg | Freq: Once | INTRAMUSCULAR | Status: AC
  Administered 2019-06-05: 09:00:00 4 mg via INTRAVENOUS
  Filled 2019-06-05: qty 2

## 2019-06-05 MED ORDER — SODIUM CHLORIDE 0.9% FLUSH: 3.0000 mL | Freq: Once | INTRAVENOUS | Status: DC

## 2019-06-05 MED ORDER — SODIUM CHLORIDE 0.9 % IV BOLUS
500.0000 mL | Freq: Once | INTRAVENOUS | Status: AC
  Administered 2019-06-05: 500 mL via INTRAVENOUS

## 2019-06-05 MED ORDER — SODIUM CHLORIDE (PF) 0.9 % IJ SOLN
INTRAMUSCULAR | Status: AC
  Filled 2019-06-05: qty 50

## 2019-06-05 MED ORDER — IOHEXOL 300 MG/ML  SOLN
100.0000 mL | Freq: Once | INTRAMUSCULAR | Status: AC | PRN
  Administered 2019-06-05: 100 mL via INTRAVENOUS

## 2019-06-05 NOTE — ED Provider Notes (Signed)
[facility] DEPT Provider Note   CSN: [id number] Arrival date & time: 06/05/19  0841     History Chief Complaint  Patient presents with  . Abdominal Pain  . Emesis    [name] is a 44 y.o. female.  HPI She reports onset of abdominal pain this morning followed by vomiting, stomach contents, without blood.  No diarrhea.  She reports the abdominal pain is in the upper mid abdomen.  She has previously had her gallbladder removed.  She is currently on a prednisone taper, for generalized myalgia and arthralgia.  She has a chronic unspecified inflammatory process requiring treatment with prednisone.  No definitive rheumatologic process has been diagnosed.  She denies fever, chills, cough, shortness of breath, weakness or dizziness.  She works as a Games developer.  She drinks alcohol occasionally, most recently, last evening.  She occasionally has symptoms requiring her to use chewable antacids, and omeprazole.  No prior diagnosis of ulcer disease.  There are no other known modifying factors.    Past Medical History:  Diagnosis Date  . Allergy   . Autoimmune disorder ([place])   . Crohn's disease ([place])   . Endometriosis   . Hypertension   . Kidney, medullary sponge   . Ovarian cyst   . Pre-eclampsia   . Raynaud's disease   . Stress incontinence in female     Patient Active Problem List   Diagnosis Date Noted  . Tremor 03/16/2019  . Prediabetes 02/09/2019  . Hypothyroidism (acquired) 08/02/2018  . Nonallopathic lesion of rib cage 10/08/2017  . Hyperglycemia 07/27/2017  . Antalgic gait 04/19/2017  . Polyarthralgia 02/26/2016  . GERD (gastroesophageal reflux disease) 02/17/2016  . Cervical lymphadenopathy 01/21/2016  . Cough 09/14/2015  . Nonallopathic lesion of thoracic region 08/23/2015  . Lumbar radiculopathy 08/02/2015  . 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 Scapular dysfunction 03/20/2015  . Medullary sponge  kidney 02/19/2015  . 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 Nonallopathic lesion of lumbosacral region 06/29/2014  . Nonallopathic lesion of sacral region 06/29/2014  . Nonallopathic lesion of pelvic region 06/29/2014  . Recurrent UTI 05/18/2013  . Back pain, acute 10/07/2010  . Allergic rhinitis 08/14/2010  . Essential hypertension, benign 01/22/2010    Past Surgical History:  Procedure Laterality Date  . CHOLECYSTECTOMY    . DILATION AND CURETTAGE OF UTERUS    . ENDOMETRIAL ABLATION    . NOVASURE ABLATION    . TUBAL LIGATION       OB History   No obstetric history on file.     Family History  Problem Relation Age of Onset  . Breast cancer Mother   . Hypertension Father   . Hyperlipidemia Father   . Other Brother        lyme-chronic  . Stroke Maternal Grandmother   . Alcohol abuse Paternal Grandmother   . Heart disease Paternal Grandfather   . Colon cancer Neg Hx   . Esophageal cancer Neg Hx   . Rectal cancer Neg Hx     Social History   Tobacco Use  . Smoking status: Never Smoker  . Smokeless tobacco: Never Used  Substance Use Topics  . Alcohol use: Yes  . Drug use: No    Home Medications Prior to Admission medications   Medication Sig Start Date End Date Taking? Authorizing Provider  acetaminophen (TYLENOL) 500 MG tablet Take 1,000 mg by mouth every 6 (six) hours as needed for mild pain or headache.  Yes [provider]  Acetaminophen-Caffeine (EXCEDRIN TENSION HEADACHE) 500-65 MG TABS Take 2 tablets by mouth daily as needed (headache).   Yes [provider]  amLODipine (NORVASC) 5 MG tablet Take 1 tablet (5 mg total) by mouth daily. 03/07/19  Yes Burns, Claudina Lick, MD  Cholecalciferol (VITAMIN D3) 2000 units TABS Take 2,000 Units by mouth daily.   Yes [provider]  DULoxetine (CYMBALTA) 30 MG capsule 3 pills 1 time a day Patient taking differently: Take 30 mg by mouth daily. Takes with 7m for a total dose of 956m  04/20/19  Yes SmLyndal PulleyDO  DULoxetine (CYMBALTA) 60 MG capsule TAKE 1 CAPSULE BY MOUTH DAILY. Patient taking differently: Take 60 mg by mouth daily. Take with 3053mor a total dose of 64m70m/20/20  Yes SmitHulan SaasDO  fluticasone (FLONASE) 50 MCG/ACT nasal spray Place 2 sprays into both nostrils daily. 08/02/18  Yes Burns, StacClaudina Lick  gabapentin (NEURONTIN) 100 MG capsule TAKE 2 CAPSULES BY MOUTH AT BEDTIME. Patient taking differently: Take 100 mg by mouth at bedtime as needed (nerve pain).  12/15/18  Yes SmitLyndal Pulley  levothyroxine (SYNTHROID) 50 MCG tablet TAKE 1 TABLET (50 MCG TOTAL) BY MOUTH DAILY. Patient taking differently: Take 50 mcg by mouth daily before breakfast.  05/03/19  Yes SmitLyndal Pulley  loratadine (CLARITIN) 10 MG tablet Take 10 mg by mouth daily.   Yes [provider]  multivitamin-lutein (OCUVITE-LUTEIN) CAPS capsule Take 1 capsule by mouth daily.   Yes [provider]  nitrofurantoin (MACRODANTIN) 50 MG capsule TAKE AFTER INTERCOURSE Patient taking differently: Take 50 mg by mouth daily as needed (UTI prophylaxis). TAKE AFTER INTERCOURSE 04/11/19  Yes Burns, StacClaudina Lick  omeprazole (PRILOSEC) 20 MG capsule Take 20 mg by mouth daily.   Yes [provider]  Prasterone, DHEA, (DHEA ADVANCED FORMULA PO) Take 1 tablet by mouth daily.    Yes [provider]  predniSONE (DELTASONE) 50 MG tablet 1 tablet by mouth daily 05/31/19  Yes SmitHulan SaasDO  Propylene Glycol (SYSTANE COMPLETE OP) Place 1 drop into both eyes daily.   Yes [provider]  traMADol (ULTRAM) 50 MG tablet TAKE 1 TABLET (50 MG TOTAL) BY MOUTH EVERY 12 (TWELVE) HOURS AS NEEDED. Patient taking differently: Take 50 mg by mouth every 12 (twelve) hours as needed for moderate pain.  01/05/19  Yes Burns, StacClaudina Lick  hydrochlorothiazide (HYDRODIURIL) 25 MG tablet TAKE 1 TABLET BY MOUTH DAILY. Patient not taking: Reported on 06/05/2019 03/16/19   BurnBinnie Rail  potassium chloride SA (KLOR-CON) 20 MEQ tablet Take 1 tablet (20 mEq total) by mouth daily. Patient not taking: Reported on 06/05/2019 02/14/19   BurnBinnie Rail    Allergies    Lamisil [terbinafine]  Review of Systems   Review of Systems  All other systems reviewed and are negative.   Physical Exam Updated Vital Signs BP (!) 134/93 (BP Location: Right Arm)   Pulse (!) 109   Temp 98.1 F (36.7 C) (Oral)   Resp 18   Ht 5' 3"  (1.6 m)   Wt 65.8 kg   SpO2 100%   BMI 25.69 kg/m   Physical Exam Vitals and nursing note reviewed.  Constitutional:      General: She is not in acute distress.    Appearance: She is well-developed. She is not ill-appearing, toxic-appearing or diaphoretic.  HENT:     Head: Normocephalic and  atraumatic.     Right Ear: External ear normal.     Left Ear: External ear normal.  Eyes:     Conjunctiva/sclera: Conjunctivae normal.     Pupils: Pupils are equal, round, and reactive to light.  Neck:     Trachea: Phonation normal.  Cardiovascular:     Rate and Rhythm: Normal rate and regular rhythm.     Heart sounds: Normal heart sounds.  Pulmonary:     Effort: Pulmonary effort is normal. No respiratory distress.     Breath sounds: Normal breath sounds. No stridor.  Abdominal:     General: There is no distension.     Palpations: Abdomen is soft.     Tenderness: There is abdominal tenderness (Mid epigastric, mild).  Musculoskeletal:        General: Normal range of motion.     Cervical back: Normal range of motion and neck supple.  Skin:    General: Skin is warm and dry.  Neurological:     Mental Status: She is alert and oriented to person, place, and time.     Cranial Nerves: No cranial nerve deficit.     Sensory: No sensory deficit.     Motor: No abnormal muscle tone.     Coordination: Coordination normal.  Psychiatric:        Mood and Affect: Mood normal.        Behavior: Behavior normal.        Thought Content: Thought content  normal.        Judgment: Judgment normal.     ED Results / Procedures / Treatments   Labs (all labs ordered are listed, but only abnormal results are displayed) Labs Reviewed  COMPREHENSIVE METABOLIC PANEL - Abnormal; Notable for the following components:      Result Value   Glucose, Bld 115 (*)    BUN 25 (*)    All other components within normal limits  CBC - Abnormal; Notable for the following components:   WBC 21.9 (*)    Hemoglobin 15.3 (*)    HCT 46.1 (*)    All other components within normal limits  LIPASE, BLOOD  URINALYSIS, ROUTINE W REFLEX MICROSCOPIC  ETHANOL  RAPID URINE DRUG SCREEN, HOSP PERFORMED    EKG None  Radiology No results found.  Procedures Procedures (including critical care time)  Medications Ordered in ED Medications  sodium chloride flush (NS) 0.9 % injection 3 mL (3 mLs Intravenous Not Given 06/05/19 0927)  acetaminophen (TYLENOL) tablet 650 mg (has no administration in time range)  sodium chloride 0.9 % bolus 500 mL (500 mLs Intravenous New Bag/Given 06/05/19 0918)  ondansetron (ZOFRAN) injection 4 mg (4 mg Intravenous Given 06/05/19 0355)    ED Course  I have reviewed the triage vital signs and the nursing notes.  Pertinent labs & imaging results that were available during my care of the patient were reviewed by me and considered in my medical decision making (see chart for details).  Clinical Course as of Jun 04 1208  Mon Jun 05, 2019  1202 Normal  Urinalysis, Routine w reflex microscopic [EW]  1202 Normal  Lipase, blood [EW]  1202 Normal  Ethanol [EW]  1202 Abnormal, white count high  CBC(!) [EW]  1202 Normal except glucose high, BUN high  Comprehensive metabolic panel(!) [EW]  9741 She states that she feels better, less nauseous, but now has a headache and would like to try taking Tylenol.  She agrees to proceed with CT imaging to evaluate  the abdominal pain.   [EW]    Clinical Course User Index [EW] Daleen Bo, MD   MDM  Rules/Calculators/A&P                       Patient Vitals for the past 24 hrs:  BP Temp Temp src Pulse Resp SpO2 Height Weight  06/05/19 1530 112/72 -- -- 90 18 99 % -- --  06/05/19 1430 111/71 -- -- 88 16 98 % -- --  06/05/19 1415 117/77 -- -- 96 -- 100 % -- --  06/05/19 1400 116/82 -- -- 89 -- 99 % -- --  06/05/19 1345 133/81 -- -- 92 -- 100 % -- --  06/05/19 1330 117/72 -- -- 91 16 98 % -- --  06/05/19 1315 116/90 -- -- 89 16 99 % -- --  06/05/19 1215 122/88 -- -- 93 18 99 % -- --  06/05/19 0850 -- -- -- -- -- -- 5' 3"  (1.6 m) 65.8 kg  06/05/19 0845 (!) 134/93 98.1 F (36.7 C) Oral (!) 109 18 100 % -- --    4:22 PM Reevaluation with update and discussion. After initial assessment and treatment, an updated evaluation reveals patient is comfortable.  Findings discussed with her.  She is aware of her chronic renal stones and renal cysts.  She states that she has intermittent chronic constipation.  She is currently using fiber not stool softeners.  All questions answered. Daleen Bo   Medical Decision Making: Abdominal pain nonspecific with reassuring evaluation.  No acute abnormalities on CT imaging with exception of likely constipation.  Doubt serious bacterial infection, metabolic instability or impending vascular collapse.  Patient stable for discharge with symptomatic treatment.  [name] was evaluated in Emergency Department on 06/05/2019 for the symptoms described in the history of present illness. She was evaluated in the context of the global COVID-19 pandemic, which necessitated consideration that the patient might be at risk for infection with the SARS-CoV-2 virus that causes COVID-19. Institutional protocols and algorithms that pertain to the evaluation of patients at risk for COVID-19 are in a state of rapid change based on information released by regulatory bodies including the CDC and federal and state organizations. These policies and algorithms were followed  during the patient's care in the ED.   CRITICAL CARE-no Performed by: Daleen Bo   Nursing Notes Reviewed/ Care Coordinated Applicable Imaging Reviewed Interpretation of Laboratory Data incorporated into ED treatment  The patient appears reasonably screened and/or stabilized for discharge and I doubt any other medical condition or other [facility] requiring further screening, evaluation, or treatment in the ED at this time prior to discharge.  Plan: Home Medications-continue usual and add stool softener of choice; Home Treatments-rest, fluids, fiber for stooling; return here if the recommended treatment, does not improve the symptoms; Recommended follow up-PCP, as needed.  Consider seeing GI for chronic constipation    Final Clinical Impression(s) / ED Diagnoses Final diagnoses:  None    Rx / DC Orders ED Discharge Orders    None       Daleen Bo, MD 06/05/19 1623

## 2019-06-05 NOTE — ED Notes (Signed)
Pt stated that she will call out when she can urinate.

## 2019-06-05 NOTE — ED Triage Notes (Signed)
Patient c/o mid abdominal pain and vomiting since 0300 today. Patient denies any diarrhea. Patient states she has been on a Prednisone pack and then she states that she had some hard ciders. Patient state since drinking those she has been  Having issues.

## 2019-06-05 NOTE — Discharge Instructions (Addendum)
The testing today is reassuring.  There are no signs of acute abnormalities.  There is constipation with stool throughout the colon, but may be contributing to your discomfort.  Continue using fiber, and hydration to improve stooling.  It will also help to use a stool softener of your choice for a few weeks.  See your GI doctor as needed for problems.

## 2019-06-05 NOTE — ED Notes (Signed)
Pt transported to CT ?

## 2019-06-06 ENCOUNTER — Encounter: Payer: Self-pay | Admitting: Family Medicine

## 2019-06-06 ENCOUNTER — Ambulatory Visit: Payer: 59 | Admitting: Family Medicine

## 2019-06-06 VITALS — BP 100/70 | HR 89 | Ht 63.0 in | Wt 145.0 lb

## 2019-06-06 DIAGNOSIS — M533 Sacrococcygeal disorders, not elsewhere classified: Secondary | ICD-10-CM

## 2019-06-06 DIAGNOSIS — H5203 Hypermetropia, bilateral: Secondary | ICD-10-CM | POA: Diagnosis not present

## 2019-06-06 DIAGNOSIS — M2550 Pain in unspecified joint: Secondary | ICD-10-CM | POA: Diagnosis not present

## 2019-06-06 DIAGNOSIS — M999 Biomechanical lesion, unspecified: Secondary | ICD-10-CM | POA: Diagnosis not present

## 2019-06-06 MED ORDER — ONDANSETRON 4 MG PO TBDP: 4.0000 mg | ORAL_TABLET | Freq: Three times a day (TID) | ORAL | 0 refills | Status: DC | PRN

## 2019-06-06 MED ORDER — KETOROLAC TROMETHAMINE 60 MG/2ML IM SOLN
60.0000 mg | Freq: Once | INTRAMUSCULAR | Status: AC
  Administered 2019-06-06: 60 mg via INTRAMUSCULAR

## 2019-06-06 MED ORDER — METHYLPREDNISOLONE ACETATE 80 MG/ML IJ SUSP
80.0000 mg | Freq: Once | INTRAMUSCULAR | Status: AC
  Administered 2019-06-06: 80 mg via INTRAMUSCULAR

## 2019-06-06 MED FILL — ONDANSETRON ODT 4 MG TABLET: 4 | 7 days supply | Qty: 20 | Fill #0

## 2019-06-06 NOTE — Progress Notes (Signed)
[place] [address] [place] Phone: [phone] Subjective:   I [name] am serving as a Education administrator for Dr. Hulan Saas.  This visit occurred during the SARS-CoV-2 public health emergency.  Safety protocols were in place, including screening questions prior to the visit, additional usage of staff PPE, and extensive cleaning of exam room while observing appropriate contact time as indicated for disinfecting solutions.   I'm seeing this patient by the request  of:  Binnie Rail, MD  CC: Back pain, all over pain follow-up  UJW:[id number]  Coila Nicholl Onstott is a 44 y.o. female coming in with complaint of back pain. Last seen on 04/20/2019 for OMT. Patient states she is not feeling well today. Started steroids and got sick. Was in ER all day yesterday.  Patient was having significant amount of abdominal pain with nausea and vomiting.  Did have a CT of the abdomen done.  This was independently visualized by me.  Nothing significant at that time.  Patient is feeling somewhat better with the abdominal pain.  Feels like she is here mostly as for the sacroiliac joint.  Feels like it is out of place and was even before she started feeling bad.       Past Medical History:  Diagnosis Date  . Allergy   . Autoimmune disorder ([place])   . Crohn's disease ([place])   . Endometriosis   . Hypertension   . Kidney, medullary sponge   . Ovarian cyst   . Pre-eclampsia   . Raynaud's disease   . Stress incontinence in female    Past Surgical History:  Procedure Laterality Date  . CHOLECYSTECTOMY    . DILATION AND CURETTAGE OF UTERUS    . ENDOMETRIAL ABLATION    . NOVASURE ABLATION    . TUBAL LIGATION     Social History   Socioeconomic History  . Marital status: Married    Spouse name: Not on file  . Number of children: 3  . Years of education: Not on file  . Highest education level: Not on file  Occupational History  . Occupation: Education administrator: [employer]  Tobacco Use  . Smoking status: Never Smoker  . Smokeless tobacco: Never Used  Substance and Sexual Activity  . Alcohol use: Yes  . Drug use: No  . Sexual activity: Yes  Other Topics Concern  . 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 Difficulty of Paying Living Expenses:   Food Insecurity:   . Worried About Charity fundraiser in the Last Year:   . Arboriculturist in the Last Year:   Transportation Needs:   . Film/video editor (Medical):   Marland Kitchen Lack of Transportation (Non-Medical):   Physical Activity:   . Days of Exercise per Week:   . Minutes of Exercise per Session:   Stress:   . Feeling of Stress :   Social Connections:   . Frequency of Communication with Friends and Family:   . Frequency of Social Gatherings with Friends and Family:   . Attends Religious Services:   . Active Member of Clubs or Organizations:   . Attends Archivist Meetings:   Marland Kitchen Marital Status:    Allergies  Allergen Reactions  . Lamisil [Terbinafine] Hives and Itching    In pill form  Family History  Problem Relation Age of Onset  . Breast cancer Mother   . Hypertension Father   . Hyperlipidemia Father   . Other Brother        lyme-chronic  . Stroke Maternal Grandmother   . Alcohol abuse Paternal Grandmother   . Heart disease Paternal Grandfather   . Colon cancer Neg Hx   . Esophageal cancer Neg Hx   . Rectal cancer Neg Hx     Current Outpatient Medications (Endocrine & Metabolic):  .  levothyroxine (SYNTHROID) 50 MCG tablet, TAKE 1 TABLET (50 MCG TOTAL) BY MOUTH DAILY. (Patient taking differently: Take 50 mcg by mouth daily before breakfast. ) .  predniSONE (DELTASONE) 50 MG tablet, 1 tablet by mouth daily  Current Outpatient Medications (Cardiovascular):  .  amLODipine (NORVASC) 5 MG tablet, Take 1 tablet (5 mg total) by mouth daily. .   hydrochlorothiazide (HYDRODIURIL) 25 MG tablet, TAKE 1 TABLET BY MOUTH DAILY.  Current Outpatient Medications (Respiratory):  .  fluticasone (FLONASE) 50 MCG/ACT nasal spray, Place 2 sprays into both nostrils daily. Marland Kitchen  loratadine (CLARITIN) 10 MG tablet, Take 10 mg by mouth daily.  Current Outpatient Medications (Analgesics):  .  acetaminophen (TYLENOL) 500 MG tablet, Take 1,000 mg by mouth every 6 (six) hours as needed for mild pain or headache. .  Acetaminophen-Caffeine (EXCEDRIN TENSION HEADACHE) 500-65 MG TABS, Take 2 tablets by mouth daily as needed (headache). .  traMADol (ULTRAM) 50 MG tablet, TAKE 1 TABLET (50 MG TOTAL) BY MOUTH EVERY 12 (TWELVE) HOURS AS NEEDED. (Patient taking differently: Take 50 mg by mouth every 12 (twelve) hours as needed for moderate pain. )   Current Outpatient Medications (Other):  Marland Kitchen  Cholecalciferol (VITAMIN D3) 2000 units TABS, Take 2,000 Units by mouth daily. .  DULoxetine (CYMBALTA) 30 MG capsule, 3 pills 1 time a day (Patient taking differently: Take 30 mg by mouth daily. Takes with 54m for a total dose of 948m) .  DULoxetine (CYMBALTA) 60 MG capsule, TAKE 1 CAPSULE BY MOUTH DAILY. (Patient taking differently: Take 60 mg by mouth daily. Take with 3019mor a total dose of 58m38m.  gabapentin (NEURONTIN) 100 MG capsule, TAKE 2 CAPSULES BY MOUTH AT BEDTIME. (Patient taking differently: Take 100 mg by mouth at bedtime as needed (nerve pain). ) .  multivitamin-lutein (OCUVITE-LUTEIN) CAPS capsule, Take 1 capsule by mouth daily. .  nitrofurantoin (MACRODANTIN) 50 MG capsule, TAKE AFTER INTERCOURSE (Patient taking differently: Take 50 mg by mouth daily as needed (UTI prophylaxis). TAKE AFTER INTERCOURSE) .  omeprazole (PRILOSEC) 20 MG capsule, Take 20 mg by mouth daily. .  potassium chloride SA (KLOR-CON) 20 MEQ tablet, Take 1 tablet (20 mEq total) by mouth daily. .  Prasterone, DHEA, (DHEA ADVANCED FORMULA PO), Take 1 tablet by mouth daily.  .  PMarland Kitchenopylene  Glycol (SYSTANE COMPLETE OP), Place 1 drop into both eyes daily. .  ondansetron (ZOFRAN ODT) 4 MG disintegrating tablet, Take 1 tablet (4 mg total) by mouth every 8 (eight) hours as needed for nausea or vomiting.   Reviewed prior external information including notes and imaging from  primary care provider As well as notes that were available from care everywhere and other healthcare systems.  Past medical history, social, surgical and family history all reviewed in electronic medical record.  No pertanent information unless stated regarding to the chief complaint.   Review of Systems:  No headache, visual changes, nausea, vomiting, diarrhea, constipation, dizziness,, skin rash, fevers, chills, night sweats, weight loss, swollen  lymph nodes, , joint swelling, chest pain, shortness of breath, mood changes. POSITIVE muscle aches, body aches, abdominal pain  Objective  Blood pressure 100/70, pulse 89, height 5' 3"  (1.6 m), weight 145 lb (65.8 kg), SpO2 97 %.   General: No apparent distress alert and oriented x3 mood and affect normal, dressed appropriately.  HEENT: Pupils equal, extraocular movements intact  Respiratory: Patient's speak in full sentences and does not appear short of breath  Cardiovascular: No lower extremity edema, non tender, no erythema  Neuro: Cranial nerves II through XII are intact, neurovascularly intact in all extremities with 2+ DTRs and 2+ pulses.  Gait normal with good balance and coordination.  MSK:  tender with mild limited range of motion and good stability and symmetric strength and tone of shoulders, elbows, wrist, hip, knee and ankles bilaterally.  Low back exam does have some mild loss of lordosis.  Tender to palpation over the sacroiliac joint.  Right greater than left.  Tightness with Corky Sox test bilaterally.  Pain in the paraspinal musculature lumbar spine as well.  Osteopathic findings T3 extended rotated and side bent right inhaled third rib T9 extended  rotated and side bent left L2 flexed rotated and side bent right Sacrum right on right Pelvic shear right   Impression and Recommendations:     This case required medical decision making of moderate complexity. The above documentation has been reviewed and is accurate and complete Lyndal Pulley, DO       Note: This dictation was prepared with Dragon dictation along with smaller phrase technology. Any transcriptional errors that result from this process are unintentional.

## 2019-06-06 NOTE — Patient Instructions (Signed)
Zofran as needed Miralax and Colace for next week Tried OMT See me in 4 weeks Send me a message on Thursday

## 2019-06-07 ENCOUNTER — Encounter: Payer: Self-pay | Admitting: Family Medicine

## 2019-06-07 MED ORDER — TIZANIDINE HCL 4 MG PO TABS: 4.0000 mg | ORAL_TABLET | Freq: Every evening | ORAL | 2 refills | Status: AC

## 2019-06-07 MED FILL — tiZANidine HCL 4 MG TABS: 4 | 30 days supply | Qty: 30 | Fill #0

## 2019-06-07 NOTE — Assessment & Plan Note (Signed)

## 2019-06-07 NOTE — Assessment & Plan Note (Signed)
Seems to be more sacroiliac joint dysfunction.  Discussed with patient about being Cymbalta again in the chronic medications.  It is a chronic problem with exacerbation.  We will continue to monitor, discussed icing regimen and home exercise, which activities to do which wants to avoid.  Increase activity as tolerated.  Follow-up again in 4 to 8 weeks

## 2019-06-08 ENCOUNTER — Encounter: Payer: Self-pay | Admitting: Family Medicine

## 2019-06-09 ENCOUNTER — Other Ambulatory Visit: Payer: Self-pay | Admitting: Internal Medicine

## 2019-06-12 ENCOUNTER — Encounter: Payer: Self-pay | Admitting: Internal Medicine

## 2019-06-12 ENCOUNTER — Encounter: Payer: Self-pay | Admitting: Family Medicine

## 2019-06-13 ENCOUNTER — Other Ambulatory Visit: Payer: Self-pay

## 2019-06-13 DIAGNOSIS — G8929 Other chronic pain: Secondary | ICD-10-CM

## 2019-06-13 DIAGNOSIS — M545 Low back pain, unspecified: Secondary | ICD-10-CM

## 2019-06-13 MED FILL — traMADol HCL 50 MG TABS: 50 | 15 days supply | Qty: 30 | Fill #0

## 2019-06-21 ENCOUNTER — Other Ambulatory Visit: Payer: Self-pay | Admitting: Family Medicine

## 2019-06-21 ENCOUNTER — Encounter: Payer: Self-pay | Admitting: Internal Medicine

## 2019-06-26 ENCOUNTER — Ambulatory Visit: Payer: 59 | Attending: Family Medicine | Admitting: Physical Therapy

## 2019-06-26 ENCOUNTER — Other Ambulatory Visit: Payer: Self-pay

## 2019-06-26 DIAGNOSIS — M545 Low back pain: Secondary | ICD-10-CM | POA: Insufficient documentation

## 2019-06-26 DIAGNOSIS — G8929 Other chronic pain: Secondary | ICD-10-CM | POA: Diagnosis not present

## 2019-06-26 DIAGNOSIS — M533 Sacrococcygeal disorders, not elsewhere classified: Secondary | ICD-10-CM | POA: Diagnosis not present

## 2019-06-26 DIAGNOSIS — M6281 Muscle weakness (generalized): Secondary | ICD-10-CM | POA: Diagnosis not present

## 2019-06-26 MED FILL — DULoxetine HCL 60 MG CPEP: 60 | 90 days supply | Qty: 90 | Fill #0

## 2019-06-26 NOTE — Patient Instructions (Signed)
    Home exercise program created by Madhuri Vacca, PT.  For questions, please contact Delois Tolbert via phone at [phone] or email at [email or web address]  [facility] [place] [address]  [address] [place], [place], [address] Phone: [phone]   Fax:  [phone]    

## 2019-06-26 NOTE — Therapy (Signed)
[place] [address]  [place], [place], [address] Phone: [phone]   Fax:  [phone]  Physical Therapy Evaluation  Patient Details  Name: [name] MRN: [id number] Date of Birth: 1975/05/25 Referring Provider (PT): Lyndal Pulley, DO   Encounter Date: 06/26/2019  PT End of Session - 06/26/19 1007    Visit Number  1    Number of Visits  12    Date for PT Re-Evaluation  08/07/19    Authorization Type  [facility] - UMR    PT Start Time  1007    PT Stop Time  1107    PT Time Calculation (min)  60 min    Activity Tolerance  Patient tolerated treatment well    Behavior During Therapy  [facility] for tasks assessed/performed       Past Medical History:  Diagnosis Date  . Allergy   . Autoimmune disorder ([place])   . Crohn's disease ([place])   . Endometriosis   . Hypertension   . Kidney, medullary sponge   . Ovarian cyst   . Pre-eclampsia   . Raynaud's disease   . Stress incontinence in female     Past Surgical History:  Procedure Laterality Date  . CHOLECYSTECTOMY    . DILATION AND CURETTAGE OF UTERUS    . ENDOMETRIAL ABLATION    . NOVASURE ABLATION    . TUBAL LIGATION      There were no vitals filed for this visit.   Subjective Assessment - 06/26/19 1011    Subjective  Pt reports h/o multiple problems with her back for which PT has been helpful in the past. Also notes h/o R SIJ dysfunction due to h/o injury to one other deep hip rotators. 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    Limitations  Sitting;Walking    How long can you sit comfortably?  20 minutes    How long can you walk comfortably?  30 minutes    Diagnostic tests  08/11/15 - Lumbar MRI: 1. Slight progression of leftward disc protrusion at L5-S1 results in a mild left subarticular stenosis with probable contact of the traversing left S1 nerve root.  2. Slight progression  of mild bilateral facet hypertrophy at L4-5 without significant stenosis.    Patient Stated Goals  "Increase core & R hip strength and decrease back pain"    Currently in Pain?  Yes    Pain Score  6     Pain Location  Back    Pain Orientation  Lower;Right   near SIJ   Pain Descriptors / Indicators  Stabbing;Burning   "pushing a hot poker into that one spot"   Pain Type  Acute pain;Chronic pain    Pain Radiating Towards  R buttocks; dull laterally to both side from ~L4-5 level    Pain Onset  1 to 4 weeks ago   recent exacerbation since 06/07/19   Pain Frequency  Constant   varies in intensity   Aggravating Factors   prolonged walking; prolonged sitting; sitting or driving (worse) in a car (even a 30-minute drive)    Pain Relieving Factors  ice pack; stretching in shower; Tylenol arthritis ER; uses Tramadol sparingly    Effect of Pain on Daily Activities  difficulty with household chores esp laundry, mopping,vacuuming; 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   No    Is the patient reluctant to leave their home because of a fear of falling?   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[employer]  time with family; no regular exercise but trying to increase her walking due to recent dx of pre-diabetes      Cognition   Overall Cognitive Status  Within Functional Limits for tasks assessed      Observation/Other Assessments   Focus on Therapeutic Outcomes (FOTO)   Lumbar - 54% (46% limitation); Predicted 71% (29% limitation)      ROM / Strength   AROM / PROM / Strength  AROM;Strength      AROM   AROM Assessment Site  Lumbar    Lumbar Flexion  finger tips to floor    Lumbar Extension  50% limited    Lumbar - Right Side Bend  hand to lateral joint line of knee    Lumbar - Left Side Bend  hand to lateral joint line of knee    Lumbar - Right Rotation  50% limited    Lumbar - Left Rotation  50% limited      Strength   Strength Assessment Site  Hip;Knee;Ankle    Right/Left Hip  Right;Left    Right Hip Flexion  4/5    Right Hip Extension  4-/5    Right Hip External Rotation   4-/5    Right Hip Internal Rotation  4-/5    Right Hip ABduction  4/5    Right Hip ADduction  4/5    Left Hip Flexion  4/5    Left Hip Extension  4-/5    Left Hip External Rotation  4-/5    Left Hip Internal Rotation  4-/5    Left Hip ABduction  4/5    Left Hip ADduction  4/5    Right/Left Knee  Right;Left    Right Knee Flexion  4/5    Right Knee Extension  4/5    Left Knee Flexion  4/5    Left Knee Extension  4/5    Right/Left Ankle  Right;Left    Right Ankle Dorsiflexion  4/5    Right Ankle Plantar  Flexion  4/5    Right Ankle Inversion  --    Left Ankle Dorsiflexion  4/5    Left Ankle Plantar Flexion  4/5      Flexibility   Soft Tissue Assessment /Muscle Length  yes    Hamstrings  B tightness R > L    Quadriceps  B tightness R > L    ITB  B mild tightness L > R    Piriformis  B mild tightness      Palpation   Spinal mobility   hypomobile lumbar spine and sacrum - pain with mobilization of L4-L5 and sacrum    SI assessment   Apparent R posterior innominate rotation of pelvis on sacrum    Palpation comment  ttp over lower B lumbar paraspinals, B SIJ, B medial glutes, B piriformis                Objective measurements completed on examination: See above findings.      [place] Adult PT Treatment/Exercise - 06/26/19 1007      Exercises   Exercises  Lumbar      Lumbar Exercises: Stretches   Passive Hamstring Stretch  Right;Left;30 seconds;1 rep    Passive Hamstring Stretch Limitations  seated hip hinge      Lumbar Exercises: Supine   Pelvic Tilt  10 reps;5 seconds      Manual Therapy   Manual Therapy  Muscle Energy Technique    Manual therapy comments  MET for correction of apparent R posterior innominate rotation of pelvis on sacrum followed by pelvic shotgun - alignment appears WNL following MET             PT Education - 06/26/19 1107    Education Details  PT eval findings, anticipated POC and initial HEP    Person(s) Educated  Patient    Methods  Explanation;Demonstration;Handout    Comprehension  Verbalized understanding;Returned demonstration;Need further instruction       PT Short Term Goals - 06/26/19 1107      PT SHORT TERM GOAL #1   Title  Patient will be independent with initial HEP    Status  New    [place] Date  07/17/19      PT SHORT TERM GOAL #2   Title  Patient will verbalize/demonstrate understanding of neutral spine posture and proper body mechanics to reduce strain on lumbar spine    Status  New    Target Date  07/17/19        PT Long Term Goals - 06/26/19 1107      PT LONG TERM GOAL #1   Title  Patient will be independent with ongoing/advanced HEP    Status  New    Target Date  08/07/19      PT LONG TERM GOAL #2   Title  Patient to demonstrate appropriate posture and body mechanics needed for daily activities    Status  New    Target Date  08/07/19       PT LONG TERM GOAL #3   Title  Patient to improve lumabr AROM to WNL/WFL without pain provocation    Status  New    Target Date  08/07/19      PT LONG TERM GOAL #4   Title  Patient will demonstrate improved B LE strength to >/= 4+/5 for improved stability and ease of mobility    Status  New    Target Date  08/07/19      PT LONG TERM  GOAL #5   Title  Patient will complete household chores and tolerate 12 hr work shift with pain no greater than 3-4/10    Status  New    Target Date  08/07/19             Plan - 06/26/19 1107    Clinical Impression Statement  [name] is a 44 y/o female who presents to OP PT for acute on chronic LBP and SIJ dysfunction. Patient has previously had multiple episodes of PT for SIJ pain. She reports ongoing compliance with stretches from former HEP(s) but less compliance with core strengthening activities. Pain currently in lower lumbar spine and SIJ (R>L) with ttp throughout B lower lumbar paraspinals, B glutes and piriformis. SIJ assessment revealed apparent R posterior innominate rotation of pelvis on sacrum which was reducible with MET. Deficits include decreased lumbar ROM, limited flexibility in proximal LE muscles, especially hamstrings, hip external rotators and quads (R>L) and mild LE weakness. 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 [name] will benefit from skilled PT services to address above impairments and allow for performance of normal daily activities with decreased pain interference.    Personal Factors and Comorbidities  Comorbidity 3+;Past/Current Experience;Time since onset of injury/illness/exacerbation;Profession    Comorbidities  Acute on chronic LBP & SIJ dysfunction; polyarthralgia; h/o obturator internus muscle injury; HTN; hypothyroidism; pre-diabetes; autoimmune disorder; Raynaud's disease; Crohn's disease    Examination-Activity Limitations  Caring for  Others;Lift;Sit;Locomotion Level    Examination-Participation Restrictions  Cleaning;Community Activity;Driving;Interpersonal Relationship;Laundry    Stability/Clinical Decision Making  Evolving/Moderate complexity    Clinical Decision Making  Moderate    Rehab Potential  Good    PT Frequency  2x / week    PT Duration  6 weeks    PT Treatment/Interventions  ADLs/Self Care Home Management;Cryotherapy;Electrical Stimulation;Iontophoresis 60m/ml Dexamethasone;Moist Heat;Traction;Ultrasound;Functional mobility training;Therapeutic activities;Therapeutic exercise;Balance training;Neuromuscular re-education;Patient/family education;Manual techniques;Passive range of motion;Dry needling;Taping;Spinal Manipulations;Joint Manipulations    PT Next Visit Plan  Review initial HEP; lumbopelvic/core strengthening; ,manual therapy and modalities PRN    Consulted and Agree with Plan of Care  Patient       Patient will benefit from skilled therapeutic intervention in order to improve the following deficits and impairments:  Decreased activity tolerance, Decreased knowledge of precautions, Decreased mobility, Decreased range of motion, Decreased strength, Difficulty walking, Hypomobility, Increased fascial restricitons, Increased muscle spasms, Impaired perceived functional ability, Impaired flexibility, Improper body mechanics, Postural dysfunction, Pain  Visit Diagnosis: Chronic bilateral low back pain without sciatica  Sacrococcygeal disorders, not elsewhere classified  Muscle weakness (generalized)     Problem List Patient Active Problem List   Diagnosis Date Noted  . Tremor 03/16/2019  . Prediabetes 02/09/2019  . Hypothyroidism (acquired) 08/02/2018  . Nonallopathic lesion of rib cage 10/08/2017  . Hyperglycemia 07/27/2017  . Antalgic gait 04/19/2017  . Polyarthralgia 02/26/2016  . GERD (gastroesophageal reflux disease) 02/17/2016  . Cervical lymphadenopathy 01/21/2016  . Cough 09/14/2015  .  Nonallopathic lesion of thoracic region 08/23/2015  . Lumbar radiculopathy 08/02/2015  . 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 Scapular dysfunction 03/20/2015  . 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 Nonallopathic lesion of lumbosacral region 06/29/2014  . Nonallopathic lesion of sacral region 06/29/2014  . Nonallopathic lesion of pelvic region 06/29/2014  . Recurrent UTI 05/18/2013  . Back pain, acute 10/07/2010  . Allergic rhinitis 08/14/2010  . Essential hypertension, benign 01/22/2010  Percival Spanish, PT, MPT 06/26/2019, 12:52 PM  [facility] [address]  [place], [place], [address] Phone: [phone]   Fax:  [phone]  Name: [name] MRN: [id number] Date of Birth: 1975/11/25

## 2019-06-28 ENCOUNTER — Ambulatory Visit: Payer: 59

## 2019-06-28 ENCOUNTER — Other Ambulatory Visit: Payer: Self-pay

## 2019-06-28 DIAGNOSIS — M533 Sacrococcygeal disorders, not elsewhere classified: Secondary | ICD-10-CM | POA: Diagnosis not present

## 2019-06-28 DIAGNOSIS — M545 Low back pain: Secondary | ICD-10-CM | POA: Diagnosis not present

## 2019-06-28 DIAGNOSIS — G8929 Other chronic pain: Secondary | ICD-10-CM | POA: Diagnosis not present

## 2019-06-28 DIAGNOSIS — M6281 Muscle weakness (generalized): Secondary | ICD-10-CM

## 2019-06-28 NOTE — Therapy (Addendum)
[facility] [address]  [place], [place], [address] Phone: [phone]   Fax:  [phone]  Physical Therapy Treatment  Patient Details  Name: [name] MRN: [id number] Date of Birth: 1975-04-27 Referring Provider (PT): Lyndal Pulley, DO   Encounter Date: 06/28/2019  PT End of Session - 06/28/19 0806    Visit Number  2    Number of Visits  12    Date for PT Re-Evaluation  08/07/19    Authorization Type  [facility] - UMR    PT Start Time  0801    PT Stop Time  0850    PT Time Calculation (min)  49 min    Activity Tolerance  Patient tolerated treatment well    Behavior During Therapy  [facility] for tasks assessed/performed       Past Medical History:  Diagnosis Date  . Allergy   . Autoimmune disorder ([place])   . Crohn's disease ([place])   . Endometriosis   . Hypertension   . Kidney, medullary sponge   . Ovarian cyst   . Pre-eclampsia   . Raynaud's disease   . Stress incontinence in female     Past Surgical History:  Procedure Laterality Date  . CHOLECYSTECTOMY    . DILATION AND CURETTAGE OF UTERUS    . ENDOMETRIAL ABLATION    . NOVASURE ABLATION    . TUBAL LIGATION      There were no vitals filed for this visit.  Subjective Assessment - 06/28/19 0805    Subjective  Pt. reporting some short-lasting relief from last session.    Diagnostic tests  08/11/15 - Lumbar MRI: 1. Slight progression of leftward disc protrusion at L5-S1 results in a mild left subarticular stenosis with probable contact of the traversing left S1 nerve root.  2. Slight progression of mild bilateral facet hypertrophy at L4-5 without significant stenosis.    Patient Stated Goals  "Increase core & R hip strength and decrease back pain"    Currently in Pain?  Yes    Pain Score  3     Pain Location  Back    Pain Orientation  Right;Lower    Pain Descriptors / Indicators  Stabbing;Burning    Pain Type  Acute pain;Chronic pain    Pain  Radiating Towards  dull into lateral hip    Pain Frequency  Constant    Multiple Pain Sites  No                       [facility] Adult PT Treatment/Exercise - 06/28/19 0001      Lumbar Exercises: Stretches   Passive Hamstring Stretch  Right;30 seconds;2 reps    Passive Hamstring Stretch Limitations  seated hip hinge    Lower Trunk Rotation Limitations  5" x 10 reps    Pelvic Tilt  10 reps    Pelvic Tilt Limitations  5" hold    Piriformis Stretch  Right;Left;1 rep;30 seconds    Piriformis Stretch Limitations  KTOS    Figure 4 Stretch  1 rep;30 seconds    Figure 4 Stretch Limitations  B sitting- figure-4      Lumbar Exercises: Aerobic   Nustep  Lvl 3, 6 min (UE/LE)      Lumbar Exercises: Supine   Pelvic Tilt  10 reps;5 seconds    Clam  10 reps;3 seconds    Clam Limitations  red TB alternating clam shell    Bridge  with clamshell  10 reps;5 seconds    Bridge with Diona Foley Squeeze Limitations  red TB at knees       Manual Therapy   Manual Therapy  Muscle Energy Technique    Manual therapy comments  MET for correction of apparent L posterior innominate rotation of pelvis on sacrum followed by pelvic shotgun - improved alignment visible following MET               PT Short Term Goals - 06/28/19 0807      PT SHORT TERM GOAL #1   Title  Patient will be independent with initial HEP    Status  Achieved    Target Date  07/17/19      PT SHORT TERM GOAL #2   Title  Patient will verbalize/demonstrate understanding of neutral spine posture and proper body mechanics to reduce strain on lumbar spine    Status  On-going    Target Date  07/17/19        PT Long Term Goals - 06/28/19 0807      PT LONG TERM GOAL #1   Title  Patient will be independent with ongoing/advanced HEP    Status  On-going      PT LONG TERM GOAL #2   Title  Patient to demonstrate appropriate posture and body mechanics needed for daily activities    Status  On-going      PT LONG TERM GOAL #3    Title  Patient to improve lumabr AROM to WNL/WFL without pain provocation    Status  On-going      PT LONG TERM GOAL #4   Title  Patient will demonstrate improved B LE strength to >/= 4+/5 for improved stability and ease of mobility    Status  On-going      PT LONG TERM GOAL #5   Title  Patient will complete household chores and tolerate 12 hr work shift with pain no greater than 3-4/10    Status  On-going            Plan - 06/28/19 0807    Clinical Impression Statement  Pt. noting some short-lasting relief from pain after last session.  Has some increased pain with work shift yesterday.  with complaint of tenderness over L lower back today noted in area of L SIJ and with apparent L posterior innominate rotation of pelvis on sacrum which was addressed with MT.  Duration of session focused on gentle lumbopelvic strengthening and ended session with E-stim/moist heat to lumbar spine for pain relief.  Pt. able to demo good recall and demo of HEP with review today.  STG #1 met.    Comorbidities  Acute on chronic LBP & SIJ dysfunction; polyarthralgia; h/o obturator internus muscle injury; HTN; hypothyroidism; pre-diabetes; autoimmune disorder; Raynaud's disease; Crohn's disease    Rehab Potential  Good    PT Treatment/Interventions  ADLs/Self Care Home Management;Cryotherapy;Electrical Stimulation;Iontophoresis 13m/ml Dexamethasone;Moist Heat;Traction;Ultrasound;Functional mobility training;Therapeutic activities;Therapeutic exercise;Balance training;Neuromuscular re-education;Patient/family education;Manual techniques;Passive range of motion;Dry needling;Taping;Spinal Manipulations;Joint Manipulations    PT Next Visit Plan  Lumbopelvic/core strengthening; manual therapy and modalities PRN    Consulted and Agree with Plan of Care  Patient       Patient will benefit from skilled therapeutic intervention in order to improve the following deficits and impairments:  Decreased activity tolerance,  Decreased knowledge of precautions, Decreased mobility, Decreased range of motion, Decreased strength, Difficulty walking, Hypomobility, Increased fascial restricitons, Increased muscle spasms, Impaired perceived functional ability, Impaired flexibility, Improper body  mechanics, Postural dysfunction, Pain  Visit Diagnosis: Chronic bilateral low back pain without sciatica  Sacrococcygeal disorders, not elsewhere classified  Muscle weakness (generalized)     Problem List Patient Active Problem List   Diagnosis Date Noted  . Tremor 03/16/2019  . Prediabetes 02/09/2019  . Hypothyroidism (acquired) 08/02/2018  . Nonallopathic lesion of rib cage 10/08/2017  . Hyperglycemia 07/27/2017  . Antalgic gait 04/19/2017  . Polyarthralgia 02/26/2016  . GERD (gastroesophageal reflux disease) 02/17/2016  . Cervical lymphadenopathy 01/21/2016  . Cough 09/14/2015  . Nonallopathic lesion of thoracic region 08/23/2015  . Lumbar radiculopathy 08/02/2015  . 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 Scapular dysfunction 03/20/2015  . 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 Nonallopathic lesion of lumbosacral region 06/29/2014  . Nonallopathic lesion of sacral region 06/29/2014  . Nonallopathic lesion of pelvic region 06/29/2014  . Recurrent UTI 05/18/2013  . Back pain, acute 10/07/2010  . Allergic rhinitis 08/14/2010  . Essential hypertension, benign 01/22/2010    Bess Harvest, PTA 06/28/19 2:36 PM   [facility] [address]  [place], [place], [address] Phone: [phone]   Fax:  [phone]  Name: [name] MRN: [id number] Date of Birth: [birth year]

## 2019-07-03 ENCOUNTER — Ambulatory Visit: Payer: 59

## 2019-07-03 ENCOUNTER — Other Ambulatory Visit: Payer: Self-pay

## 2019-07-03 DIAGNOSIS — G8929 Other chronic pain: Secondary | ICD-10-CM

## 2019-07-03 DIAGNOSIS — M545 Low back pain, unspecified: Secondary | ICD-10-CM

## 2019-07-03 DIAGNOSIS — M6281 Muscle weakness (generalized): Secondary | ICD-10-CM | POA: Diagnosis not present

## 2019-07-03 DIAGNOSIS — M533 Sacrococcygeal disorders, not elsewhere classified: Secondary | ICD-10-CM

## 2019-07-03 NOTE — Therapy (Signed)
[facility] [address]  [place], [place], [address] Phone: [phone]   Fax:  [phone]  Physical Therapy Treatment  Patient Details  Name: [name] MRN: [id number] Date of Birth: [birth year] Referring Provider (PT): Lyndal Pulley, DO   Encounter Date: 07/03/2019  PT End of Session - 07/03/19 1407    Visit Number  3    Number of Visits  12    Date for PT Re-Evaluation  08/07/19    Authorization Type  [facility] - UMR    PT Start Time  2774    PT Stop Time  1358    PT Time Calculation (min)  53 min    Activity Tolerance  Patient tolerated treatment well    Behavior During Therapy  [facility] for tasks assessed/performed       Past Medical History:  Diagnosis Date  . Allergy   . Autoimmune disorder ([place])   . Crohn's disease ([place])   . Endometriosis   . Hypertension   . Kidney, medullary sponge   . Ovarian cyst   . Pre-eclampsia   . Raynaud's disease   . Stress incontinence in female     Past Surgical History:  Procedure Laterality Date  . CHOLECYSTECTOMY    . DILATION AND CURETTAGE OF UTERUS    . ENDOMETRIAL ABLATION    . NOVASURE ABLATION    . TUBAL LIGATION      There were no vitals filed for this visit.  Subjective Assessment - 07/03/19 1306    Subjective  Pt reports she had some pain relief for 2-3 days, but feels her sacrum is being pulled back toward the left. She woek up with a lot of stiffness in general this morning, but she feels like it's hurting on the L side more today.    Diagnostic tests  08/11/15 - Lumbar MRI: 1. Slight progression of leftward disc protrusion at L5-S1 results in a mild left subarticular stenosis with probable contact of the traversing left S1 nerve root.  2. Slight progression of mild bilateral facet hypertrophy at L4-5 without significant stenosis.    Patient Stated Goals  "Increase core & R hip strength and decrease back pain"    Currently in Pain?  Yes                        [facility] Adult PT Treatment/Exercise - 07/03/19 0001      Exercises   Exercises  Lumbar;Knee/Hip      Lumbar Exercises: Stretches   Press Ups  10 reps   manual pressure to sacrum into flexion during press-up   Press Ups Limitations  partial mostly on elbows with slight elbow liftoff for last few reps       Knee/Hip Exercises: Supine   Hip Adduction Isometric  AROM;Strengthening;Both;5 sets    Hip Adduction Isometric Limitations  + abduction followed by shotgun technique    Bridges Limitations  x 2 for leg length assessment and correction      Knee/Hip Exercises: Prone   Other Prone Exercises  Prone windshield wipers (hip IR/ER) with MET into each direction 1 x 10 B   pain in L with limited IR, abolished after piriformis DTM   Other Prone Exercises  Manual quad/hip flexor S B      Manual Therapy   Manual Therapy  Joint mobilization;Soft tissue mobilization;Passive ROM;Muscle Energy Technique;Taping    Joint Mobilization  long axis distraction grade IV-V  with noted gapping to L SIJ and subsequent improvement in leg length, lumbar CPAs grade III    Soft tissue mobilization  B piriformis    Passive ROM  B hip IR/ER    Muscle Energy Technique  MET for L anterior inominate, prone hip IR/ER    Kinesiotex  Facilitate Muscle;Ligament Correction      Kinesiotix   Facilitate Muscle   lumbopelvic rhythm via X pattern from SIJ to opposite PSIS B, 25% tension for 1/2 and 50% up to almost 75% at end     Ligament Correction  SIJ support              PT Education - 07/03/19 1406    Education Details  KT and removing after 2-3 days or with any skin irritation    Person(s) Educated  Patient    Methods  Explanation    Comprehension  Verbalized understanding       PT Short Term Goals - 06/28/19 0807      PT SHORT TERM GOAL #1   Title  Patient will be independent with initial HEP    Status  Achieved    Target Date  07/17/19      PT SHORT TERM GOAL #2    Title  Patient will verbalize/demonstrate understanding of neutral spine posture and proper body mechanics to reduce strain on lumbar spine    Status  On-going    Target Date  07/17/19        PT Long Term Goals - 06/28/19 0807      PT LONG TERM GOAL #1   Title  Patient will be independent with ongoing/advanced HEP    Status  On-going      PT LONG TERM GOAL #2   Title  Patient to demonstrate appropriate posture and body mechanics needed for daily activities    Status  On-going      PT LONG TERM GOAL #3   Title  Patient to improve lumabr AROM to WNL/WFL without pain provocation    Status  On-going      PT LONG TERM GOAL #4   Title  Patient will demonstrate improved B LE strength to >/= 4+/5 for improved stability and ease of mobility    Status  On-going      PT LONG TERM GOAL #5   Title  Patient will complete household chores and tolerate 12 hr work shift with pain no greater than 3-4/10    Status  On-going            Plan - 07/03/19 1407    Clinical Impression Statement  Pt presents with some carryover after her last tx, but reported slow reversal back to L SIJ pain. Pt demonstrates R posterior inonimate with stiffness to R SIJ noted with L SLS and palpation to B PSIS, as well as forward bend test. Pt has close to 3/4" leg length discrepancy in supine (L LE shorter) compared ~1/4" leg length discrepancy in long sitting. This was mostly corrected in supine following LLE long axis distraction and manip. Pt very tender to lower lumbar spine with PAs, as well as R sacrum with UPAs. B piriformis, quad, and hip flexor restrictions noted with L worse than right. Pt responded well to manual therapy to reset pelvis and decrease pain to low back. She was unable to tolerate any prone lumbar extension independently, but had no pain with prone partial press up on elbows with flexion pressure to sacrum. Applied KT tape at end  of session in X pattern from SIJ to opposite PSIS B to promote  lumbopelvic rhythm and stability, as well as downgrade pain. She will continue to benefit from further progresssions of lumbopelvic rhythm and stability at her next visit.    Comorbidities  Acute on chronic LBP & SIJ dysfunction; polyarthralgia; h/o obturator internus muscle injury; HTN; hypothyroidism; pre-diabetes; autoimmune disorder; Raynaud's disease; Crohn's disease    Rehab Potential  Good    PT Treatment/Interventions  ADLs/Self Care Home Management;Cryotherapy;Electrical Stimulation;Iontophoresis 57m/ml Dexamethasone;Moist Heat;Traction;Ultrasound;Functional mobility training;Therapeutic activities;Therapeutic exercise;Balance training;Neuromuscular re-education;Patient/family education;Manual techniques;Passive range of motion;Dry needling;Taping;Spinal Manipulations;Joint Manipulations    PT Next Visit Plan  Lumbopelvic rhythm and stability, manual therapy for correction prior to, potentially KT tape again at end of session    Consulted and Agree with Plan of Care  Patient       Patient will benefit from skilled therapeutic intervention in order to improve the following deficits and impairments:  Decreased activity tolerance, Decreased knowledge of precautions, Decreased mobility, Decreased range of motion, Decreased strength, Difficulty walking, Hypomobility, Increased fascial restricitons, Increased muscle spasms, Impaired perceived functional ability, Impaired flexibility, Improper body mechanics, Postural dysfunction, Pain  Visit Diagnosis: Sacrococcygeal disorders, not elsewhere classified  Muscle weakness (generalized)  Chronic bilateral low back pain without sciatica     Problem List Patient Active Problem List   Diagnosis Date Noted  . Tremor 03/16/2019  . Prediabetes 02/09/2019  . Hypothyroidism (acquired) 08/02/2018  . Nonallopathic lesion of rib cage 10/08/2017  . Hyperglycemia 07/27/2017  . Antalgic gait 04/19/2017  . Polyarthralgia 02/26/2016  . GERD  (gastroesophageal reflux disease) 02/17/2016  . Cervical lymphadenopathy 01/21/2016  . Cough 09/14/2015  . Nonallopathic lesion of thoracic region 08/23/2015  . Lumbar radiculopathy 08/02/2015  . 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 Scapular dysfunction 03/20/2015  . 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 Nonallopathic lesion of lumbosacral region 06/29/2014  . Nonallopathic lesion of sacral region 06/29/2014  . Nonallopathic lesion of pelvic region 06/29/2014  . Recurrent UTI 05/18/2013  . Back pain, acute 10/07/2010  . Allergic rhinitis 08/14/2010  . Essential hypertension, benign 01/22/2010    KIzell Yauco PT, DPT 07/03/2019, 2:14 PM  [facility] [place] [address]Phone: [phone]  Fax:  [phone] Name: EBeula JoynerMRN: [id number] of Birth: [birth year]

## 2019-07-07 ENCOUNTER — Encounter: Payer: Self-pay | Admitting: Family Medicine

## 2019-07-07 ENCOUNTER — Ambulatory Visit (INDEPENDENT_AMBULATORY_CARE_PROVIDER_SITE_OTHER): Payer: 59 | Admitting: Family Medicine

## 2019-07-07 ENCOUNTER — Other Ambulatory Visit: Payer: Self-pay

## 2019-07-07 VITALS — BP 100/76 | HR 75 | Ht 63.0 in | Wt 143.0 lb

## 2019-07-07 DIAGNOSIS — M2550 Pain in unspecified joint: Secondary | ICD-10-CM | POA: Diagnosis not present

## 2019-07-07 DIAGNOSIS — M999 Biomechanical lesion, unspecified: Secondary | ICD-10-CM | POA: Diagnosis not present

## 2019-07-07 MED ORDER — VILAZODONE HCL 10 & 20 MG PO KIT: PACK | ORAL | 0 refills | Status: DC

## 2019-07-07 MED FILL — VIIBRYD 10-20 MG STARTER PA: 10 & 20 | 30 days supply | Qty: 30 | Fill #0

## 2019-07-07 NOTE — Progress Notes (Signed)
[facility] [address] [place] Phone: [phone] Subjective:   I [name] am serving as a Education administrator for Dr. Hulan Saas.  This visit occurred during the SARS-CoV-2 public health emergency.  Safety protocols were in place, including screening questions prior to the visit, additional usage of staff PPE, and extensive cleaning of exam room while observing appropriate contact time as indicated for disinfecting solutions.   I'm seeing this patient by the request  of:  Binnie Rail, MD  CC: Back pain and neck pain  FGH:[id number]   [name] is a 44 y.o. female coming in with complaint of back pain. Last seen on 06/06/2019 for OMT. Patient states she is sore today.  Patient feels like the Cymbalta is no longer working for her.  States that the pain seems to be as intense.  States that anything that seems to be out of the ordinary movement causes so much pain that she has to stop activity.      Past Medical History:  Diagnosis Date  . Allergy   . Autoimmune disorder ([place])   . Crohn's disease ([place])   . Endometriosis   . Hypertension   . Kidney, medullary sponge   . Ovarian cyst   . Pre-eclampsia   . Raynaud's disease   . Stress incontinence in female    Past Surgical History:  Procedure Laterality Date  . CHOLECYSTECTOMY    . DILATION AND CURETTAGE OF UTERUS    . ENDOMETRIAL ABLATION    . NOVASURE ABLATION    . TUBAL LIGATION     Social History   Socioeconomic History  . Marital status: Married    Spouse name: Not on file  . Number of children: 3  . Years of education: Not on file  . Highest education level: Not on file  Occupational History  . Occupation: Programmer, multimedia:   Tobacco Use  . Smoking status: Never Smoker  . Smokeless tobacco: Never Used  Substance and Sexual Activity  . Alcohol use: Yes  . Drug use: No  . Sexual activity: Yes  Other Topics Concern  . 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 Difficulty of Paying Living Expenses:   Food Insecurity:   . Worried About Charity fundraiser in the Last Year:   . Arboriculturist in the Last Year:   Transportation Needs:   . Film/video editor (Medical):   Marland Kitchen Lack of Transportation (Non-Medical):   Physical Activity:   . Days of Exercise per Week:   . Minutes of Exercise per Session:   Stress:   . Feeling of Stress :   Social Connections:   . Frequency of Communication with Friends and Family:   . Frequency of Social Gatherings with Friends and Family:   . Attends Religious Services:   . Active Member of Clubs or Organizations:   . Attends Archivist Meetings:   Marland Kitchen Marital Status:    Allergies  Allergen Reactions  . Lamisil [Terbinafine] Hives and Itching    In pill form   Family History  Problem Relation Age of Onset  . Breast cancer Mother   . Hypertension Father   . Hyperlipidemia Father   . Other Brother        lyme-chronic  .  Stroke Maternal Grandmother   . Alcohol abuse Paternal Grandmother   . Heart disease Paternal Grandfather   . Colon cancer Neg Hx   . Esophageal cancer Neg Hx   . Rectal cancer Neg Hx     Current Outpatient Medications (Endocrine & Metabolic):  .  levothyroxine (SYNTHROID) 50 MCG tablet, TAKE 1 TABLET (50 MCG TOTAL) BY MOUTH DAILY. (Patient taking differently: Take 50 mcg by mouth daily before breakfast. )  Current Outpatient Medications (Cardiovascular):  .  amLODipine (NORVASC) 5 MG tablet, Take 1 tablet (5 mg total) by mouth daily. .  hydrochlorothiazide (HYDRODIURIL) 25 MG tablet, TAKE 1 TABLET BY MOUTH DAILY.  Current Outpatient Medications (Respiratory):  .  fluticasone (FLONASE) 50 MCG/ACT nasal spray, Place 2 sprays into both nostrils daily. Marland Kitchen  loratadine (CLARITIN) 10 MG tablet, Take 10 mg by mouth daily.  Current Outpatient  Medications (Analgesics):  .  acetaminophen (TYLENOL) 500 MG tablet, Take 1,000 mg by mouth every 6 (six) hours as needed for mild pain or headache. .  Acetaminophen-Caffeine (EXCEDRIN TENSION HEADACHE) 500-65 MG TABS, Take 2 tablets by mouth daily as needed (headache). .  traMADol (ULTRAM) 50 MG tablet, Take 1 tablet (50 mg total) by mouth every 12 (twelve) hours as needed for moderate pain.   Current Outpatient Medications (Other):  Marland Kitchen  Cholecalciferol (VITAMIN D3) 2000 units TABS, Take 2,000 Units by mouth daily. .  DULoxetine (CYMBALTA) 30 MG capsule, 3 pills 1 time a day (Patient taking differently: Take 30 mg by mouth daily. Takes with 22m for a total dose of 9110m) .  DULoxetine (CYMBALTA) 60 MG capsule, TAKE 1 CAPSULE BY MOUTH DAILY. . Marland Kitchengabapentin (NEURONTIN) 100 MG capsule, TAKE 2 CAPSULES BY MOUTH AT BEDTIME. (Patient taking differently: Take 100 mg by mouth at bedtime as needed (nerve pain). ) .  multivitamin-lutein (OCUVITE-LUTEIN) CAPS capsule, Take 1 capsule by mouth daily. .  nitrofurantoin (MACRODANTIN) 50 MG capsule, TAKE AFTER INTERCOURSE (Patient taking differently: Take 50 mg by mouth daily as needed (UTI prophylaxis). TAKE AFTER INTERCOURSE) .  omeprazole (PRILOSEC) 20 MG capsule, Take 20 mg by mouth daily. .  ondansetron (ZOFRAN ODT) 4 MG disintegrating tablet, Take 1 tablet (4 mg total) by mouth every 8 (eight) hours as needed for nausea or vomiting. .  potassium chloride SA (KLOR-CON) 20 MEQ tablet, Take 1 tablet (20 mEq total) by mouth daily. .  Prasterone, DHEA, (DHEA ADVANCED FORMULA PO), Take 1 tablet by mouth daily.  . Marland KitchenPropylene Glycol (SYSTANE COMPLETE OP), Place 1 drop into both eyes daily. .  Vilazodone HCl 10 & 20 MG KIT, Use as directed on box   Reviewed prior external information including notes and imaging from  primary care provider As well as notes that were available from care everywhere and other healthcare systems.  Past medical history, social,  surgical and family history all reviewed in electronic medical record.  No pertanent information unless stated regarding to the chief complaint.   Review of Systems:  No headache, visual changes, nausea, vomiting, diarrhea, constipation, dizziness, abdominal pain, skin rash, fevers, chills, night sweats, weight loss, swollen lymph nodes, , joint swelling, chest pain, shortness of breath, mood changes. POSITIVE muscle aches, body aches  Objective  Blood pressure 100/76, pulse 75, height 5' 3"  (1.6 m), weight 143 lb (64.9 kg), SpO2 96 %.   General: No apparent distress alert and oriented x3 mood and affect normal, dressed appropriately.  HEENT: Pupils equal, extraocular movements intact  Respiratory: Patient's speak in full  sentences and does not appear short of breath  Cardiovascular: No lower extremity edema, non tender, no erythema  Neuro: Cranial nerves II through XII are intact, neurovascularly intact in all extremities with 2+ DTRs and 2+ pulses.  Gait normal with good balance and coordination.  MSK:  tender with full range of motion and good stability and symmetric strength and tone of shoulders, elbows, wrist, hip, knee and ankles bilaterally.  Neck exam does have some loss of lordosis.  More tenderness along the thoracolumbar junction.  Patient is tender in most joints as well today.  Limited range of motion of the back secondary to voluntary guarding noted.  Osteopathic findings  T3 extended rotated and side bent right inhaled third rib T9 extended rotated and side bent left L2 flexed rotated and side bent right Sacrum right on right Pelvic shear left   Impression and Recommendations:     This case required medical decision making of moderate complexity. The above documentation has been reviewed and is accurate and complete Lyndal Pulley, DO       Note: This dictation was prepared with Dragon dictation along with smaller phrase technology. Any transcriptional errors that  result from this process are unintentional.

## 2019-07-07 NOTE — Patient Instructions (Signed)
See me again in 6 weeks Decrease cymbalta to 30 mg Viibryd when you increase discontinue cymbalta

## 2019-07-07 NOTE — Assessment & Plan Note (Signed)
Worsening polyarthralgia.  Not responding as well to the Cymbalta at the moment.  We will discontinue in the near future.  In addition to this we will try new medications which include Viibryd.  Patient will titrate down on the Cymbalta and then in 2 weeks discontinue and go to the 20 mg of the Viibryd.  Warned of potential side effects.  Hopefully will make some improvement.  Discussed icing regimen.  Follow-up again in 4 to 8 weeks

## 2019-07-07 NOTE — Assessment & Plan Note (Signed)
   Decision today to treat with OMT was based on Physical Exam  After verbal consent patient was treated with HVLA, ME, FPR techniques in pelvis, thoracic, rib, lumbar and sacral areas, all areas are chronic   Patient tolerated the procedure well with improvement in symptoms  Patient given exercises, stretches and lifestyle modifications  See medications in patient instructions if given  Patient will follow up in 4-8 weeks

## 2019-07-10 ENCOUNTER — Ambulatory Visit: Payer: 59 | Attending: Family Medicine | Admitting: Physical Therapy

## 2019-07-10 ENCOUNTER — Other Ambulatory Visit: Payer: Self-pay

## 2019-07-10 ENCOUNTER — Encounter: Payer: Self-pay | Admitting: Physical Therapy

## 2019-07-10 DIAGNOSIS — M533 Sacrococcygeal disorders, not elsewhere classified: Secondary | ICD-10-CM | POA: Insufficient documentation

## 2019-07-10 DIAGNOSIS — M545 Low back pain, unspecified: Secondary | ICD-10-CM

## 2019-07-10 DIAGNOSIS — M6281 Muscle weakness (generalized): Secondary | ICD-10-CM | POA: Insufficient documentation

## 2019-07-10 DIAGNOSIS — G8929 Other chronic pain: Secondary | ICD-10-CM | POA: Diagnosis not present

## 2019-07-10 NOTE — Patient Instructions (Addendum)
Sleeping on Back  Place pillow under knees. A pillow with cervical support and a roll around waist are also helpful. Copyright  VHI. All rights reserved.  Sleeping on Side Place pillow between knees. Use cervical support under neck and a roll around waist as needed. Copyright  VHI. All rights reserved.   Sleeping on Stomach   If this is the only desirable sleeping position, place pillow under lower legs, and under stomach or chest as needed.  Posture - Sitting   Sit upright, head facing forward. Try using a roll to support lower back. Keep shoulders relaxed, and avoid rounded back. Keep hips level with knees. Avoid crossing legs for long periods. Stand to Sit / Sit to Stand   To sit: Bend knees to lower self onto front edge of chair, then scoot back on seat. To stand: Reverse sequence by placing one foot forward, and scoot to front of seat. Use rocking motion to stand up.   Work Height and Reach  Ideal work height is no more than 2 to 4 inches below elbow level when standing, and at elbow level when sitting. Reaching should be limited to arm's length, with elbows slightly bent.  Bending  Bend at hips and knees, not back. Keep feet shoulder-width apart.    Posture - Standing   Good posture is important. Avoid slouching and forward head thrust. Maintain curve in low back and align ears over shoul- ders, hips over ankles.  Alternating Positions   Alternate tasks and change positions frequently to reduce fatigue and muscle tension. Take rest breaks. Computer Work   Position work to face forward. Use proper work and seat height. Keep shoulders back and down, wrists straight, and elbows at right angles. Use chair that provides full back support. Add footrest and lumbar roll as needed.  Getting Into / Out of Car  Lower self onto seat, scoot back, then bring in one leg at a time. Reverse sequence to get out.  Dressing  Lie on back to pull socks or slacks over feet, or sit  and bend leg while keeping back straight.    Housework - Sink  Place one foot on ledge of cabinet under sink when standing at sink for prolonged periods.   Pushing / Pulling  Pushing is preferable to pulling. Keep back in proper alignment, and use leg muscles to do the work.  Deep Squat   Squat and lift with both arms held against upper trunk. Tighten stomach muscles without holding breath. Use smooth movements to avoid jerking.  Avoid Twisting   Avoid twisting or bending back. Pivot around using foot movements, and bend at knees if needed when reaching for articles.  Carrying Luggage   Distribute weight evenly on both sides. Use a cart whenever possible. Do not twist trunk. Move body as a unit.   Lifting Principles .Maintain proper posture and head alignment. .Slide object as close as possible before lifting. .Move obstacles out of the way. .Test before lifting; ask for help if too heavy. .Tighten stomach muscles without holding breath. .Use smooth movements; do not jerk. .Use legs to do the work, and pivot with feet. .Distribute the work load symmetrically and close to the center of trunk. .Push instead of pull whenever possible.   Ask For Help   Ask for help and delegate to others when possible. Coordinate your movements when lifting together, and maintain the low back curve.  Log Roll   Lying on back, bend left knee and place left   arm across chest. Roll all in one movement to the right. Reverse to roll to the left. Always move as one unit. Housework - Sweeping  Use long-handled equipment to avoid stooping.   Housework - Wiping  Position yourself as close as possible to reach work surface. Avoid straining your back.  Laundry - Unloading Wash   To unload small items at bottom of washer, lift leg opposite to arm being used to reach.  [place] close to area to be raked. Use arm movements to do the work. Keep back straight and avoid  twisting.     Cart  When reaching into cart with one arm, lift opposite leg to keep back straight.   Getting Into / Out of Bed  Lower self to lie down on one side by raising legs and lowering head at the same time. Use arms to assist moving without twisting. Bend both knees to roll onto back if desired. To sit up, start from lying on side, and use same move-ments in reverse. Housework - Vacuuming  Hold the vacuum with arm held at side. Step back and forth to move it, keeping head up. Avoid twisting.   Laundry - IT consultant so that bending and twisting can be avoided.   Laundry - Unloading Dryer  Squat down to reach into clothes dryer or use a reacher.  Gardening - Weeding / Probation officer or Kneel. Knee pads may be helpful.                       Kinesiology tape  What is kinesiology tape?  There are many brands of kinesiology tape. KTape, [employer], [employer], Dynamic tape, to name a few.  It is an elasticized tape designed to support the body's natural healing process. This tape provides stability and support to muscles and joints without restricting motion.  It can also help decrease swelling in the area of application.  How does it work?  The tape microscopically lifts and decompresses the skin to allow for drainage of lymph (swelling) to flow away from area, reducing inflammation. The tape has the ability to help re-educate the neuromuscular system by targeting specific receptors in the skin. The presence of the tape increases the body's awareness of posture and body mechanics.  Do not use with:  . Open wounds . Skin lesions . Adhesive allergies  In some rare cases, mild/moderate skin irritation can occur. This can include redness, itchiness, or hives. If this occurs, immediately remove tape and consult your primary care physician if symptoms are severe or do not resolve within 2 days.  Safe removal of the tape:  To  remove tape safely, hold nearby skin with one hand and gentle roll tape down with other hand. You can apply oil or conditioner to tape while in shower prior to removal to loosen adhesive. DO NOT swiftly rip tape off like a band-aid, as this could cause skin tears and additional skin irritation.     For questions, please contact your therapist at:  [facility] [address]  [place], [place], [address] Phone: [phone]   Fax:  [phone]

## 2019-07-10 NOTE — Therapy (Addendum)
[facility] [address]  [place], [place], [address] Phone: [phone]   Fax:  [phone]  Physical Therapy Treatment  Patient Details  Name: [name] MRN: [id number] Date of Birth: February 10, 1976 Referring Provider (PT): Lyndal Pulley, DO   Encounter Date: 07/10/2019  PT End of Session - 07/10/19 0804    Visit Number  4    Number of Visits  12    Date for PT Re-Evaluation  08/07/19    Authorization Type  [facility] - UMR    PT Start Time  0804    PT Stop Time  0854    PT Time Calculation (min)  50 min    Activity Tolerance  Patient tolerated treatment well    Behavior During Therapy  [facility] for tasks assessed/performed       Past Medical History:  Diagnosis Date  . Allergy   . Autoimmune disorder ([place])   . Crohn's disease ([place])   . Endometriosis   . Hypertension   . Kidney, medullary sponge   . Ovarian cyst   . Pre-eclampsia   . Raynaud's disease   . Stress incontinence in female     Past Surgical History:  Procedure Laterality Date  . CHOLECYSTECTOMY    . DILATION AND CURETTAGE OF UTERUS    . ENDOMETRIAL ABLATION    . NOVASURE ABLATION    . TUBAL LIGATION      There were no vitals filed for this visit.  Subjective Assessment - 07/10/19 0808    Subjective  Pt noting benefit from taping but it came off that night due to night sweats. Feels like alignment stayed corrected until Wed.  Saw sports MD on Friday who corrected her SIJ alignment again, but starting having pain again yesterday and feels like it is out again.    Diagnostic tests  08/11/15 - Lumbar MRI: 1. Slight progression of leftward disc protrusion at L5-S1 results in a mild left subarticular stenosis with probable contact of the traversing left S1 nerve root.  2. Slight progression of mild bilateral facet hypertrophy at L4-5 without significant stenosis.    Patient Stated Goals  "Increase core & R hip strength and decrease back pain"     Currently in Pain?  Yes    Pain Score  5     Pain Location  Back   SIJ   Pain Orientation  Left;Lower    Pain Type  Acute pain;Chronic pain                       [facility] Adult PT Treatment/Exercise - 07/10/19 0804      Self-Care   Self-Care  Posture    Posture  Provided instruction in proper posture and body mechanics for typical daily tasks, emphasizing positions/movement patterns to avoid to reduce risk for SIJ dysfunction (esp use of pillow btw knees when sleeping on side and avoiding sitting with legs crossed or folded under buttock/thigh).      Lumbar Exercises: Aerobic   Nustep  L4 x 6 min (UE/LE)   seat #6     Manual Therapy   Manual Therapy  Joint mobilization;Soft tissue mobilization;Muscle Energy Technique;Taping;Other (comment)    Joint Mobilization  long axis distraction grade IV-V with noted gapping to L SIJ and subsequent improvement in leg length    Soft tissue mobilization  B piriformis, L hip flexors    Other Manual Therapy  Provided instruction/demonstration in self-STM to  hip flexors, glutes & piriformis using tennis ball on wall or foam roller    Muscle Energy Technique  MET for L anterior inominate followed by pelvic shotgun      Kines[employer]ix   Ligament Correction  SIJ support - 50% horizontally across B PSIS + 50% stars over B SIJ   black RockTape            PT Education - 07/10/19 0854    Education Details  Posture & body mechanics education; self-STM using tennis ball and foam roller; review of KT wearing instructions    Person(s) Educated  Patient    Methods  Explanation;Demonstration;Handout    Comprehension  Verbalized understanding       PT Short Term Goals - 06/28/19 0807      PT SHORT TERM GOAL #1   Title  Patient will be independent with initial HEP    Status  Achieved    Target Date  07/17/19      PT SHORT TERM GOAL #2   Title  Patient will verbalize/demonstrate understanding of neutral spine posture and proper body  mechanics to reduce strain on lumbar spine    Status  On-going    Target Date  07/17/19        PT Long Term Goals - 06/28/19 0807      PT LONG TERM GOAL #1   Title  Patient will be independent with ongoing/advanced HEP    Status  On-going      PT LONG TERM GOAL #2   Title  Patient to demonstrate appropriate posture and body mechanics needed for daily activities    Status  On-going      PT LONG TERM GOAL #3   Title  Patient to improve lumabr AROM to WNL/WFL without pain provocation    Status  On-going      PT LONG TERM GOAL #4   Title  Patient will demonstrate improved B LE strength to >/= 4+/5 for improved stability and ease of mobility    Status  On-going      PT LONG TERM GOAL #5   Title  Patient will complete household chores and tolerate 12 hr work shift with pain no greater than 3-4/10    Status  On-going            Plan - 07/10/19 0854    Clinical Impression Statement  Irasema reports typically 2 days of relief from SIJ adjustments before she feels that alignment shifts - unable to identifying triggering activity but questions if sleeping position could be contributing. Provided instruction in proper posture and body mechanics for typical daily tasks, emphasizing positions/movement patterns to avoid to reduce risk for SIJ dysfunction (esp use of pillow btw knees when sleeping on side and avoiding sitting with legs crossed or folded under opposite buttock/thigh). Recent pain has been more L sided with alignment check revealing R posterior/L anterior innominate ~1/2" leg length discrepancy in supine (L LE shorter) which reduces to ~1/4" leg length discrepancy in long sitting. This was mostly corrected in supine following LLE long axis distraction and MET for correction of pelvic alignment. Manual STM addressing B piriformis, quad, and hip flexor restrictions noted with L worse than right, with patient educated on self-STM using tennis ball on wall or foam roller. Pt noting  benefit from KT taping but reports tape came off that night due to night sweats - new tape applied using Rock[employer] in hopes of improving tape longevity. Next visit to emphasize progression of  lumbopelvic rhythm and stability.    Personal Factors and Comorbidities  Comorbidity 3+;Past/Current Experience;Time since onset of injury/illness/exacerbation;Profession    Comorbidities  Acute on chronic LBP & SIJ dysfunction; polyarthralgia; h/o obturator internus muscle injury; HTN; hypothyroidism; pre-diabetes; autoimmune disorder; Raynaud's disease; Crohn's disease    Examination-Activity Limitations  Caring for Others;Lift;Sit;Locomotion Level    Examination-Participation Restrictions  Cleaning;Community Activity;Driving;Interpersonal Relationship;Laundry    Rehab Potential  Good    PT Frequency  2x / week    PT Duration  6 weeks    PT Treatment/Interventions  ADLs/Self Care Home Management;Cryotherapy;Electrical Stimulation;Iontophoresis 59m/ml Dexamethasone;Moist Heat;Traction;Ultrasound;Functional mobility training;Therapeutic activities;Therapeutic exercise;Balance training;Neuromuscular re-education;Patient/family education;Manual techniques;Passive range of motion;Dry needling;Taping;Spinal Manipulations;Joint Manipulations    PT Next Visit Plan  Lumbopelvic rhythm and stability, manual therapy for SIJ correction as indicated, KT tape as continued benefit noted    PT Home Exercise Plan  06/26/19 - posterior pelvic tilt, seated HS and piriformis stretches    Consulted and Agree with Plan of Care  Patient       Patient will benefit from skilled therapeutic intervention in order to improve the following deficits and impairments:  Decreased activity tolerance, Decreased knowledge of precautions, Decreased mobility, Decreased range of motion, Decreased strength, Difficulty walking, Hypomobility, Increased fascial restricitons, Increased muscle spasms, Impaired perceived functional ability, Impaired  flexibility, Improper body mechanics, Postural dysfunction, Pain  Visit Diagnosis: Chronic bilateral low back pain without sciatica  Sacrococcygeal disorders, not elsewhere classified  Muscle weakness (generalized)     Problem List Patient Active Problem List   Diagnosis Date Noted  . Tremor 03/16/2019  . Prediabetes 02/09/2019  . Hypothyroidism (acquired) 08/02/2018  . Nonallopathic lesion of rib cage 10/08/2017  . Hyperglycemia 07/27/2017  . Antalgic gait 04/19/2017  . Polyarthralgia 02/26/2016  . GERD (gastroesophageal reflux disease) 02/17/2016  . Cervical lymphadenopathy 01/21/2016  . Cough 09/14/2015  . Nonallopathic lesion of thoracic region 08/23/2015  . Lumbar radiculopathy 08/02/2015  . 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 Scapular dysfunction 03/20/2015  . 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 Nonallopathic lesion of lumbosacral region 06/29/2014  . Nonallopathic lesion of sacral region 06/29/2014  . Nonallopathic lesion of pelvic region 06/29/2014  . Recurrent UTI 05/18/2013  . Back pain, acute 10/07/2010  . Allergic rhinitis 08/14/2010  . Essential hypertension, benign 01/22/2010    JPercival Spanish PT, MPT 07/10/2019, 10:15 AM  [facility] [place] [address]Phone: [phone]  Fax:  [phone] Name: EShaguana LoveMRN: [id number] of Birth: [birth year]

## 2019-07-13 ENCOUNTER — Other Ambulatory Visit: Payer: Self-pay

## 2019-07-13 ENCOUNTER — Ambulatory Visit: Payer: 59 | Admitting: Physical Therapy

## 2019-07-13 DIAGNOSIS — M545 Low back pain: Secondary | ICD-10-CM

## 2019-07-13 DIAGNOSIS — M6281 Muscle weakness (generalized): Secondary | ICD-10-CM | POA: Diagnosis not present

## 2019-07-13 DIAGNOSIS — G8929 Other chronic pain: Secondary | ICD-10-CM

## 2019-07-13 DIAGNOSIS — M533 Sacrococcygeal disorders, not elsewhere classified: Secondary | ICD-10-CM

## 2019-07-13 NOTE — Therapy (Addendum)
[facility] [place] [address]  [place], [place], [address] Phone: [phone]   Fax:  [phone]  Physical Therapy Treatment  Patient Details  Name: [name] MRN: [id number] Date of Birth: 02-03-1976 Referring Provider (PT): Lorraine Pulley, DO   Encounter Date: 07/13/2019  PT End of Session - 07/13/19 0803    Visit Number  5    Number of Visits  12    Date for PT Re-Evaluation  08/07/19    Authorization Type  [facility] - UMR    PT Start Time  0803    PT Stop Time  0848    PT Time Calculation (min)  45 min    Activity Tolerance  Patient tolerated treatment well    Behavior During Therapy  [facility] for tasks assessed/performed       Past Medical History:  Diagnosis Date  . Allergy   . Autoimmune disorder ([place])   . Crohn's disease ([place])   . Endometriosis   . Hypertension   . Kidney, medullary sponge   . Ovarian cyst   . Pre-eclampsia   . Raynaud's disease   . Stress incontinence in female     Past Surgical History:  Procedure Laterality Date  . CHOLECYSTECTOMY    . DILATION AND CURETTAGE OF UTERUS    . ENDOMETRIAL ABLATION    . NOVASURE ABLATION    . TUBAL LIGATION      There were no vitals filed for this visit.  Subjective Assessment - 07/13/19 0806    Subjective  Pain more centralized today and hasn't shifted to the L as previously. Reports yesterday was a very busy day at work.    Diagnostic tests  08/11/15 - Lumbar MRI: 1. Slight progression of leftward disc protrusion at L5-S1 results in a mild left subarticular stenosis with probable contact of the traversing left S1 nerve root.  2. Slight progression of mild bilateral facet hypertrophy at L4-5 without significant stenosis.    Patient Stated Goals  "Increase core & R hip strength and decrease back pain"    Currently in Pain?  Yes    Pain Score  5     Pain Location  Sacrum    Pain Descriptors / Indicators  Aching    Pain Type  Acute pain;Chronic  pain    Pain Frequency  Constant                       [facility] Adult PT Treatment/Exercise - 07/13/19 0803      Lumbar Exercises: Stretches   Prone on Elbows Stretch  30 seconds;2 reps    Press Ups  10 reps   manual pressure to sacrum into flexion during press-up   Press Ups Limitations  partial mostly on elbows with slight elbow liftoff for last few reps       Lumbar Exercises: Aerobic   Nustep  L4 x 6 min (UE/LE)   seat #6     Lumbar Exercises: Supine   Dead Bug  10 reps;3 seconds    Dead Bug Limitations  LE only    Bridge  10 reps;5 seconds    Bridge Limitations  + red TB hip ABD isometric    Bridge with clamshell  10 reps;5 seconds    Bridge with [employer] Limitations  alt red TB hip ABD/ER      Lumbar Exercises: Quadruped   Madcat/Old Horse  10 reps  Manual Therapy   Manual Therapy  Soft tissue mobilization;Muscle Energy Technique;Taping    Soft tissue mobilization  B glutes/piriformis    Muscle Energy Technique  MET for L anterior innominate followed by pelvic shotgun      Kinesiotix   Ligament Correction  SIJ support - 50% horizontally across B PSIS + 50% stars over B SIJ   blue argyle RockTape              PT Short Term Goals - 07/13/19 0809      PT SHORT TERM GOAL #1   Title  Patient will be independent with initial HEP    Status  Achieved   06/28/19     PT SHORT TERM GOAL #2   Title  Patient will verbalize/demonstrate understanding of neutral spine posture and proper body mechanics to reduce strain on lumbar spine    Status  Achieved   07/13/19       PT Long Term Goals - 07/13/19 0810      PT LONG TERM GOAL #1   Title  Patient will be independent with ongoing/advanced HEP    Status  On-going    Target Date  08/07/19      PT LONG TERM GOAL #2   Title  Patient to demonstrate appropriate posture and body mechanics needed for daily activities    Status  On-going    Target Date  08/07/19      PT LONG TERM GOAL #3   Title   Patient to improve lumabr AROM to WNL/WFL without pain provocation    Status  On-going    Target Date  08/07/19      PT LONG TERM GOAL #4   Title  Patient will demonstrate improved B LE strength to >/= 4+/5 for improved stability and ease of mobility    Status  On-going    Target Date  08/07/19      PT LONG TERM GOAL #5   Title  Patient will complete household chores and tolerate 12 hr work shift with pain no greater than 3-4/10    Status  On-going    Target Date  08/07/19            Plan - 07/13/19 0811    Clinical Impression Statement  [name] reports good understanding of posture and body mechanics education provided last visit and denies need for review. Pain more centralized over sacrum and B SIJ today, however alignment demonstrating return of L anterior innominate rotation which was reducible with MET. If SIJ alignment continues to shift with consistent pattern, may consider instruction in self-MET for correction. Continued progression of lumbopelvic strengthening and stabilization exercises and will consider HEP update next visit if exercises continue to be well tolerated. Pt reporting improved longevity of KT application following last visit, with pt requesting reapplication today.    Personal Factors and Comorbidities  Comorbidity 3+;Past/Current Experience;Time since onset of injury/illness/exacerbation;Profession    Comorbidities  Acute on chronic LBP & SIJ dysfunction; polyarthralgia; h/o obturator internus muscle injury; HTN; hypothyroidism; pre-diabetes; autoimmune disorder; Raynaud's disease; Crohn's disease    Examination-Activity Limitations  Caring for Others;Lift;Sit;Locomotion Level    Examination-Participation Restrictions  Cleaning;Community Activity;Driving;Interpersonal Relationship;Laundry    Rehab Potential  Good    PT Frequency  2x / week    PT Duration  6 weeks    PT Treatment/Interventions  ADLs/Self Care Home Management;Cryotherapy;Electrical  Stimulation;Iontophoresis 47m/ml Dexamethasone;Moist Heat;Traction;Ultrasound;Functional mobility training;Therapeutic activities;Therapeutic exercise;Balance training;Neuromuscular re-education;Patient/family education;Manual techniques;Passive range of motion;Dry needling;Taping;Spinal Manipulations;Joint Manipulations  PT Next Visit Plan  Lumbopelvic rhythm and stability, manual therapy for SIJ correction as indicated, KT tape as continued benefit noted    PT Home Exercise Plan  06/26/19 - posterior pelvic tilt, seated HS and piriformis stretches    Consulted and Agree with Plan of Care  Patient       Patient will benefit from skilled therapeutic intervention in order to improve the following deficits and impairments:  Decreased activity tolerance, Decreased knowledge of precautions, Decreased mobility, Decreased range of motion, Decreased strength, Difficulty walking, Hypomobility, Increased fascial restricitons, Increased muscle spasms, Impaired perceived functional ability, Impaired flexibility, Improper body mechanics, Postural dysfunction, Pain  Visit Diagnosis: Chronic bilateral low back pain without sciatica  Sacrococcygeal disorders, not elsewhere classified  Muscle weakness (generalized)     Problem List Patient Active Problem List   Diagnosis Date Noted  . Tremor 03/16/2019  . Prediabetes 02/09/2019  . Hypothyroidism (acquired) 08/02/2018  . Nonallopathic lesion of rib cage 10/08/2017  . Hyperglycemia 07/27/2017  . Antalgic gait 04/19/2017  . Polyarthralgia 02/26/2016  . GERD (gastroesophageal reflux disease) 02/17/2016  . Cervical lymphadenopathy 01/21/2016  . Cough 09/14/2015  . Nonallopathic lesion of thoracic region 08/23/2015  . Lumbar radiculopathy 08/02/2015  . 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 Scapular dysfunction 03/20/2015  . 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 Nonallopathic lesion of  lumbosacral region 06/29/2014  . Nonallopathic lesion of sacral region 06/29/2014  . Nonallopathic lesion of pelvic region 06/29/2014  . Recurrent UTI 05/18/2013  . Back pain, acute 10/07/2010  . Allergic rhinitis 08/14/2010  . Essential hypertension, benign 01/22/2010    Percival Spanish, PT, MPT 07/13/2019, 12:20 PM  [facility] [address]  [place], [place], [address] Phone: [phone]   Fax:  [phone]  Name: [name] MRN: [id number] Date of Birth: [birth year]

## 2019-07-17 ENCOUNTER — Other Ambulatory Visit: Payer: Self-pay

## 2019-07-17 ENCOUNTER — Ambulatory Visit: Payer: 59 | Admitting: Physical Therapy

## 2019-07-17 ENCOUNTER — Encounter: Payer: Self-pay | Admitting: Physical Therapy

## 2019-07-17 DIAGNOSIS — G8929 Other chronic pain: Secondary | ICD-10-CM | POA: Diagnosis not present

## 2019-07-17 DIAGNOSIS — M6281 Muscle weakness (generalized): Secondary | ICD-10-CM

## 2019-07-17 DIAGNOSIS — M533 Sacrococcygeal disorders, not elsewhere classified: Secondary | ICD-10-CM | POA: Diagnosis not present

## 2019-07-17 DIAGNOSIS — M545 Low back pain: Secondary | ICD-10-CM | POA: Diagnosis not present

## 2019-07-17 NOTE — Therapy (Signed)
[facility] [place] [facility] [address]  [place], [place], [address] Phone: [phone]   Fax:  [phone]  Physical Therapy Treatment  Patient Details  Name: [name] MRN: [id number] Date of Birth: Oct 10, 1975 Referring Provider (PT): Lyndal Pulley, DO   Encounter Date: 07/17/2019  PT End of Session - 07/17/19 1319    Visit Number  6    Number of Visits  12    Date for PT Re-Evaluation  08/07/19    Authorization Type  [facility] - UMR    PT Start Time  1319    PT Stop Time  1400    PT Time Calculation (min)  41 min    Activity Tolerance  Patient tolerated treatment well    Behavior During Therapy  [facility] for tasks assessed/performed       Past Medical History:  Diagnosis Date  . Allergy   . Autoimmune disorder ([place])   . Crohn's disease ([place])   . Endometriosis   . Hypertension   . Kidney, medullary sponge   . Ovarian cyst   . Pre-eclampsia   . Raynaud's disease   . Stress incontinence in female     Past Surgical History:  Procedure Laterality Date  . CHOLECYSTECTOMY    . DILATION AND CURETTAGE OF UTERUS    . ENDOMETRIAL ABLATION    . NOVASURE ABLATION    . TUBAL LIGATION      There were no vitals filed for this visit.  Subjective Assessment - 07/17/19 1322    Subjective  Pt reporting her inflammation is up over her whole body today which is also making her SIJ pain worse.    Diagnostic tests  08/11/15 - Lumbar MRI: 1. Slight progression of leftward disc protrusion at L5-S1 results in a mild left subarticular stenosis with probable contact of the traversing left S1 nerve root.  2. Slight progression of mild bilateral facet hypertrophy at L4-5 without significant stenosis.    Patient Stated Goals  "Increase core & R hip strength and decrease back pain"    Currently in Pain?  Yes    Pain Score  6    5-6/10   Pain Location  Sacrum    Pain Orientation  Mid;Right    Pain Type  Acute pain;Chronic pain    Pain  Frequency  Constant                       [facility] Adult PT Treatment/Exercise - 07/17/19 1319      Lumbar Exercises: Aerobic   Nustep  L4 x 6 min (UE/LE)   seat #7     Lumbar Exercises: Standing   Wall Slides  10 reps;5 seconds    Wall Slides Limitations  + red TB hip abduction isometric    Row  Both;10 reps;Strengthening;Theraband    Theraband Level (Row)  Level 2 (Red)    Row Limitations  narrow BOS, cues for abd bracing    Shoulder Extension  Both;10 reps;Strengthening;Theraband    Theraband Level (Shoulder Extension)  Level 2 (Red)    Shoulder Extension Limitations  narrow BOS, cues for abd bracing & scap retraction    Other Standing Lumbar Exercises  B side stepping with looped red TB at ankles 2 x 20 ft      Lumbar Exercises: Supine   Bent Knee Raise  10 reps;3 seconds    Bent Knee Raise Limitations  "up/up/down/down" march from hooklying to tabletop  Dead Bug  10 reps;3 seconds    Dead Bug Limitations  UE/LE from [place] with clamshell  10 reps;5 seconds    Bridge with [employer] Limitations  red TB      Lumbar Exercises: Sidelying   Clam  Right;Left;10 reps;3 seconds    Clam Limitations  red TB      Lumbar Exercises: Quadruped   Madcat/Old Horse  10 reps   5" hold   Straight Leg Raise  10 reps;3 seconds    Plank  3 x 30" full plank hand to toes             PT Education - 07/17/19 1400    Education Details  HEP update - red TB clam, dead bug, quadruped hip extension, red TB lateral band walk & pallof press    Person(s) Educated  Patient    Methods  Explanation;Demonstration;Handout;Verbal cues    Comprehension  Verbalized understanding;Returned demonstration;Verbal cues required;Need further instruction       PT Short Term Goals - 07/13/19 0809      PT SHORT TERM GOAL #1   Title  Patient will be independent with initial HEP    Status  Achieved   06/28/19     PT SHORT TERM GOAL #2   Title  Patient will  verbalize/demonstrate understanding of neutral spine posture and proper body mechanics to reduce strain on lumbar spine    Status  Achieved   07/13/19       PT Long Term Goals - 07/13/19 0810      PT LONG TERM GOAL #1   Title  Patient will be independent with ongoing/advanced HEP    Status  On-going    Target Date  08/07/19      PT LONG TERM GOAL #2   Title  Patient to demonstrate appropriate posture and body mechanics needed for daily activities    Status  On-going    Target Date  08/07/19      PT LONG TERM GOAL #3   Title  Patient to improve lumabr AROM to WNL/WFL without pain provocation    Status  On-going    Target Date  08/07/19      PT LONG TERM GOAL #4   Title  Patient will demonstrate improved B LE strength to >/= 4+/5 for improved stability and ease of mobility    Status  On-going    Target Date  08/07/19      PT LONG TERM GOAL #5   Title  Patient will complete household chores and tolerate 12 hr work shift with pain no greater than 3-4/10    Status  On-going    Target Date  08/07/19            Plan - 07/17/19 1326    Clinical Impression Statement  [name] reporting worsening of her systemic inflammation contributing to ongoing SIJ pain, however SIJ alignment remains neutral with no evidence of shift or rotation. Interventions focusing on lumbopelvic rhythm and core strengthening/stability to reduce to facilitate maintenance of neutral alignment with improved muscular support. Good tolerance for all exercises with HEP updated to include exercises where patient noting most benefit.    Personal Factors and Comorbidities  Comorbidity 3+;Past/Current Experience;Time since onset of injury/illness/exacerbation;Profession    Comorbidities  Acute on chronic LBP & SIJ dysfunction; polyarthralgia; h/o obturator internus muscle injury; HTN; hypothyroidism; pre-diabetes; autoimmune disorder; Raynaud's disease; Crohn's disease    Examination-Activity Limitations  Caring for  Others;Lift;Sit;Locomotion Level  Examination-Participation Restrictions  Cleaning;Community Activity;Driving;Interpersonal Relationship;Laundry    Rehab Potential  Good    PT Frequency  2x / week    PT Duration  6 weeks    PT Treatment/Interventions  ADLs/Self Care Home Management;Cryotherapy;Electrical Stimulation;Iontophoresis 11m/ml Dexamethasone;Moist Heat;Traction;Ultrasound;Functional mobility training;Therapeutic activities;Therapeutic exercise;Balance training;Neuromuscular re-education;Patient/family education;Manual techniques;Passive range of motion;Dry needling;Taping;Spinal Manipulations;Joint Manipulations    PT Next Visit Plan  Lumbopelvic rhythm and stability, manual therapy for SIJ correction as indicated, KT tape as continued benefit noted    PT Home Exercise Plan  06/26/19 - posterior pelvic tilt, seated HS and piriformis stretches    Consulted and Agree with Plan of Care  Patient       Patient will benefit from skilled therapeutic intervention in order to improve the following deficits and impairments:  Decreased activity tolerance, Decreased knowledge of precautions, Decreased mobility, Decreased range of motion, Decreased strength, Difficulty walking, Hypomobility, Increased fascial restricitons, Increased muscle spasms, Impaired perceived functional ability, Impaired flexibility, Improper body mechanics, Postural dysfunction, Pain  Visit Diagnosis: Chronic bilateral low back pain without sciatica  Sacrococcygeal disorders, not elsewhere classified  Muscle weakness (generalized)     Problem List Patient Active Problem List   Diagnosis Date Noted  . Tremor 03/16/2019  . Prediabetes 02/09/2019  . Hypothyroidism (acquired) 08/02/2018  . Nonallopathic lesion of rib cage 10/08/2017  . Hyperglycemia 07/27/2017  . Antalgic gait 04/19/2017  . Polyarthralgia 02/26/2016  . GERD (gastroesophageal reflux disease) 02/17/2016  . Cervical lymphadenopathy 01/21/2016  .  Cough 09/14/2015  . Nonallopathic lesion of thoracic region 08/23/2015  . Lumbar radiculopathy 08/02/2015  . 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 Scapular dysfunction 03/20/2015  . 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 Nonallopathic lesion of lumbosacral region 06/29/2014  . Nonallopathic lesion of sacral region 06/29/2014  . Nonallopathic lesion of pelvic region 06/29/2014  . Recurrent UTI 05/18/2013  . Back pain, acute 10/07/2010  . Allergic rhinitis 08/14/2010  . Essential hypertension, benign 01/22/2010    JPercival Spanish PT, MPT 07/17/2019, 3:36 PM  [facility] [place] [address]Phone: [phone]  Fax:  [phone] Name: Lorraine FeinMRN: [id number] of Birth: [birth year]

## 2019-07-17 NOTE — Patient Instructions (Signed)
    Home exercise program created by Pilar Westergaard, PT.  For questions, please contact Jerimey Burridge via phone at [phone] or email at [email or web address]  [facility] [place] [address]  [address] [place], , [address] Phone: [phone]   Fax:  [phone]    

## 2019-07-20 ENCOUNTER — Ambulatory Visit: Payer: 59

## 2019-07-20 ENCOUNTER — Other Ambulatory Visit: Payer: Self-pay

## 2019-07-20 DIAGNOSIS — M533 Sacrococcygeal disorders, not elsewhere classified: Secondary | ICD-10-CM

## 2019-07-20 DIAGNOSIS — G8929 Other chronic pain: Secondary | ICD-10-CM | POA: Diagnosis not present

## 2019-07-20 DIAGNOSIS — M6281 Muscle weakness (generalized): Secondary | ICD-10-CM

## 2019-07-20 DIAGNOSIS — M545 Low back pain, unspecified: Secondary | ICD-10-CM

## 2019-07-20 NOTE — Therapy (Signed)
[facility] [address]  [place], [place], [address] Phone: [phone]   Fax:  [phone]  Physical Therapy Treatment  Patient Details  Name: [name] MRN: [id number] Date of Birth: 1975/06/13 Referring Provider (PT): Lyndal Pulley, DO   Encounter Date: 07/20/2019  PT End of Session - 07/20/19 1054    Visit Number  7    Number of Visits  12    Date for PT Re-Evaluation  08/07/19    Authorization Type  [facility] - UMR    PT Start Time  1019    PT Stop Time  1100    PT Time Calculation (min)  41 min    Activity Tolerance  Patient tolerated treatment well    Behavior During Therapy  [facility] for tasks assessed/performed       Past Medical History:  Diagnosis Date  . Allergy   . Autoimmune disorder ([place])   . Crohn's disease ([place])   . Endometriosis   . Hypertension   . Kidney, medullary sponge   . Ovarian cyst   . Pre-eclampsia   . Raynaud's disease   . Stress incontinence in female     Past Surgical History:  Procedure Laterality Date  . CHOLECYSTECTOMY    . DILATION AND CURETTAGE OF UTERUS    . ENDOMETRIAL ABLATION    . NOVASURE ABLATION    . TUBAL LIGATION      There were no vitals filed for this visit.  Subjective Assessment - 07/20/19 1343    Subjective  pt. noting she has increased R sided central LBP however has been working last two days in a row.    Diagnostic tests  08/11/15 - Lumbar MRI: 1. Slight progression of leftward disc protrusion at L5-S1 results in a mild left subarticular stenosis with probable contact of the traversing left S1 nerve root.  2. Slight progression of mild bilateral facet hypertrophy at L4-5 without significant stenosis.    Patient Stated Goals  "Increase core & R hip strength and decrease back pain"    Currently in Pain?  Yes    Pain Score  6     Pain Location  Sacrum    Pain Orientation  Mid;Right    Pain Descriptors / Indicators  Aching    Pain Type  Acute  pain;Chronic pain    Pain Frequency  Constant    Multiple Pain Sites  No                        [facility] Adult PT Treatment/Exercise - 07/20/19 0001      Lumbar Exercises: Aerobic   Nustep  L4 x 7 min (UE/LE)      Lumbar Exercises: Supine   Bridge with clamshell  15 reps;3 seconds    Bridge with [employer] Limitations  green TB at knees       Knee/Hip Exercises: Standing   Hip Abduction  Left;Right;10 reps;Knee straight;Stengthening    Abduction Limitations  Green TB looped at thighs     Functional Squat  15 reps;3 seconds    Functional Squat Limitations  green TB at thighs       Manual Therapy   Manual Therapy  Muscle Energy Technique;Taping    Muscle Energy Technique  MET for R anterior innominate correction followed by pelvic shotgun    Kinesiotex  Ligament Correction      Kinesiotix   Ligament Correction  SIJ support -  50% horizontally across B PSIS + 50% stars over B SIJ               PT Short Term Goals - 07/13/19 0809      PT SHORT TERM GOAL #1   Title  Patient will be independent with initial HEP    Status  Achieved   06/28/19     PT SHORT TERM GOAL #2   Title  Patient will verbalize/demonstrate understanding of neutral spine posture and proper body mechanics to reduce strain on lumbar spine    Status  Achieved   07/13/19       PT Long Term Goals - 07/13/19 0810      PT LONG TERM GOAL #1   Title  Patient will be independent with ongoing/advanced HEP    Status  On-going    Target Date  08/07/19      PT LONG TERM GOAL #2   Title  Patient to demonstrate appropriate posture and body mechanics needed for daily activities    Status  On-going    Target Date  08/07/19      PT LONG TERM GOAL #3   Title  Patient to improve lumabr AROM to WNL/WFL without pain provocation    Status  On-going    Target Date  08/07/19      PT LONG TERM GOAL #4   Title  Patient will demonstrate improved B LE strength to >/= 4+/5 for improved stability and  ease of mobility    Status  On-going    Target Date  08/07/19      PT LONG TERM GOAL #5   Title  Patient will complete household chores and tolerate 12 hr work shift with pain no greater than 3-4/10    Status  On-going    Target Date  08/07/19            Plan - 07/20/19 1345    Clinical Impression Statement  Farrell doing ok today.  Does note increased R central LBP after two days (12-hour shifts) of work.  MT addressing apparent R innominate rotation of pelvis on sacrum along with re-application of Rock Taping pattern for reduce R -sided SIJ pain (see flowsheet).  Pt. noting good reduction in initial pain from 6/10>3/10.  Duration of session focused on B LE strengthening with bridge/abduction, squat for improved lumbopelvic stability.  Pt. leaving session with noted pain relief.    Comorbidities  Acute on chronic LBP & SIJ dysfunction; polyarthralgia; h/o obturator internus muscle injury; HTN; hypothyroidism; pre-diabetes; autoimmune disorder; Raynaud's disease; Crohn's disease    Rehab Potential  Good    PT Frequency  2x / week    PT Treatment/Interventions  ADLs/Self Care Home Management;Cryotherapy;Electrical Stimulation;Iontophoresis 44m/ml Dexamethasone;Moist Heat;Traction;Ultrasound;Functional mobility training;Therapeutic activities;Therapeutic exercise;Balance training;Neuromuscular re-education;Patient/family education;Manual techniques;Passive range of motion;Dry needling;Taping;Spinal Manipulations;Joint Manipulations    PT Next Visit Plan  Lumbopelvic rhythm and stability, manual therapy for SIJ correction as indicated, KT tape as continued benefit noted    PT Home Exercise Plan  06/26/19 - posterior pelvic tilt, seated HS and piriformis stretches    Consulted and Agree with Plan of Care  Patient       Patient will benefit from skilled therapeutic intervention in order to improve the following deficits and impairments:  Decreased activity tolerance, Decreased knowledge of  precautions, Decreased mobility, Decreased range of motion, Decreased strength, Difficulty walking, Hypomobility, Increased fascial restricitons, Increased muscle spasms, Impaired perceived functional ability, Impaired flexibility, Improper body mechanics, Postural dysfunction, Pain  Visit Diagnosis: Chronic bilateral low back pain without sciatica  Sacrococcygeal disorders, not elsewhere classified  Muscle weakness (generalized)     Problem List Patient Active Problem List   Diagnosis Date Noted  . Tremor 03/16/2019  . Prediabetes 02/09/2019  . Hypothyroidism (acquired) 08/02/2018  . Nonallopathic lesion of rib cage 10/08/2017  . Hyperglycemia 07/27/2017  . Antalgic gait 04/19/2017  . Polyarthralgia 02/26/2016  . GERD (gastroesophageal reflux disease) 02/17/2016  . Cervical lymphadenopathy 01/21/2016  . Cough 09/14/2015  . Nonallopathic lesion of thoracic region 08/23/2015  . Lumbar radiculopathy 08/02/2015  . 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 Scapular dysfunction 03/20/2015  . 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 Nonallopathic lesion of lumbosacral region 06/29/2014  . Nonallopathic lesion of sacral region 06/29/2014  . Nonallopathic lesion of pelvic region 06/29/2014  . Recurrent UTI 05/18/2013  . Back pain, acute 10/07/2010  . Allergic rhinitis 08/14/2010  . Essential hypertension, benign 01/22/2010    Bess Harvest, PTA 07/20/19 1:52 PM    [facility] [address]  [place], [place], [address] Phone: [phone]   Fax:  [phone]  Name: Osie Merkin MRN: [id number] Date of Birth: 1975-10-14

## 2019-07-22 ENCOUNTER — Ambulatory Visit: Payer: 59

## 2019-07-24 ENCOUNTER — Ambulatory Visit: Payer: 59 | Admitting: Physical Therapy

## 2019-07-24 ENCOUNTER — Encounter: Payer: Self-pay | Admitting: Physical Therapy

## 2019-07-24 ENCOUNTER — Other Ambulatory Visit: Payer: Self-pay

## 2019-07-24 DIAGNOSIS — G8929 Other chronic pain: Secondary | ICD-10-CM

## 2019-07-24 DIAGNOSIS — M533 Sacrococcygeal disorders, not elsewhere classified: Secondary | ICD-10-CM | POA: Diagnosis not present

## 2019-07-24 DIAGNOSIS — M545 Low back pain, unspecified: Secondary | ICD-10-CM

## 2019-07-24 DIAGNOSIS — M6281 Muscle weakness (generalized): Secondary | ICD-10-CM | POA: Diagnosis not present

## 2019-07-24 NOTE — Therapy (Signed)
[facility] [address]  [place], [place], [address] Phone: [phone]   Fax:  [phone]  Physical Therapy Treatment  Patient Details  Name: [name] MRN: [id number] Date of Birth: 07-01-1975 Referring Provider (PT): Lorraine Pulley, DO   Encounter Date: 07/24/2019  PT End of Session - 07/24/19 1022    Visit Number  8    Number of Visits  12    Date for PT Re-Evaluation  08/07/19    Authorization Type  [facility] - UMR    PT Start Time  1022    PT Stop Time  1104    PT Time Calculation (min)  42 min    Activity Tolerance  Patient tolerated treatment well    Behavior During Therapy  [facility] for tasks assessed/performed       Past Medical History:  Diagnosis Date  . Allergy   . Autoimmune disorder ([place])   . Crohn's disease ([place])   . Endometriosis   . Hypertension   . Kidney, medullary sponge   . Ovarian cyst   . Pre-eclampsia   . Raynaud's disease   . Stress incontinence in female     Past Surgical History:  Procedure Laterality Date  . CHOLECYSTECTOMY    . DILATION AND CURETTAGE OF UTERUS    . ENDOMETRIAL ABLATION    . NOVASURE ABLATION    . TUBAL LIGATION      There were no vitals filed for this visit.  Subjective Assessment - 07/24/19 1025    Subjective  Pt reporting pain remains mostly mid sacrum btw SIJs but was bothering her a little more on the L last night - able to get relief with contour pillow btw legs.    Diagnostic tests  08/11/15 - Lumbar MRI: 1. Slight progression of leftward disc protrusion at L5-S1 results in a mild left subarticular stenosis with probable contact of the traversing left S1 nerve root.  2. Slight progression of mild bilateral facet hypertrophy at L4-5 without significant stenosis.    Patient Stated Goals  "Increase core & R hip strength and decrease back pain"    Currently in Pain?  Yes    Pain Score  4     Pain Location  Sacrum    Pain Orientation  Mid     Pain Descriptors / Indicators  Aching    Pain Type  Acute pain;Chronic pain    Pain Frequency  Constant                        [facility] Adult PT Treatment/Exercise - 07/24/19 1022      Lumbar Exercises: Stretches   Hip Flexor Stretch  Right;30 seconds;1 rep    Hip Flexor Stretch Limitations  mod thomas    Quadruped Mid Back Stretch  30 seconds;3 reps    Quadruped Mid Back Stretch Limitations  child's pose/prayer stretch      Lumbar Exercises: Aerobic   Nustep  L4 x 6 min (UE/LE)   seat #6     Lumbar Exercises: Supine   Bent Knee Raise  10 reps;3 seconds    Bent Knee Raise Limitations  B LE to tabletop position    Dead Bug  10 reps;3 seconds    Dead Bug Limitations  UE/LE from tabletop position    Bridge with clamshell  10 reps   sustained bridge x 5 clams - 2 sets   Bridge with [employer] Limitations  green TB    Isometric Hip Flexion  10 reps;3 seconds    Isometric Hip Flexion Limitations  bilateral with LE in tabletop position      Lumbar Exercises: Sidelying   Clam  Right;Left;10 reps;3 seconds    Clam Limitations  green TB    Other Sidelying Lumbar Exercises  R/L side plank elbows to knees 10 x 5"      Lumbar Exercises: Quadruped   Straight Leg Raise  10 reps;3 seconds    Straight Leg Raises Limitations  from full plank hands to toes x 5, elbows to toes x 5    Opposite Arm/Leg Raise  Right arm/Left leg;Left arm/Right leg;10 reps;3 seconds    Other Quadruped Lumbar Exercises  R/L fire hydrants 10 x 3"      Manual Therapy   Manual Therapy  Taping      Kinesiotix   Ligament Correction  SIJ support - 50% horizontally across B PSIS + 50% stars over B SIJ   camo RockTape              PT Short Term Goals - 07/13/19 0809      PT SHORT TERM GOAL #1   Title  Patient will be independent with initial HEP    Status  Achieved   06/28/19     PT SHORT TERM GOAL #2   Title  Patient will verbalize/demonstrate understanding of neutral spine posture and  proper body mechanics to reduce strain on lumbar spine    Status  Achieved   07/13/19       PT Long Term Goals - 07/13/19 0810      PT LONG TERM GOAL #1   Title  Patient will be independent with ongoing/advanced HEP    Status  On-going    Target Date  08/07/19      PT LONG TERM GOAL #2   Title  Patient to demonstrate appropriate posture and body mechanics needed for daily activities    Status  On-going    Target Date  08/07/19      PT LONG TERM GOAL #3   Title  Patient to improve lumabr AROM to WNL/WFL without pain provocation    Status  On-going    Target Date  08/07/19      PT LONG TERM GOAL #4   Title  Patient will demonstrate improved B LE strength to >/= 4+/5 for improved stability and ease of mobility    Status  On-going    Target Date  08/07/19      PT LONG TERM GOAL #5   Title  Patient will complete household chores and tolerate 12 hr work shift with pain no greater than 3-4/10    Status  On-going    Target Date  08/07/19            Plan - 07/24/19 1028    Clinical Impression Statement  [name] reporting pain remaining mostly midline but did have some increased discomfort on L last night. SIJ alignment appears to be neutral today, therefore session focusing on strengthening/stabilization progression. Good tolerance for most exercises, although L HS cramp noted with R sidelying plank. Given good tolerance for progression of theraband, green TB provided for home use. SIJ remained in neutral alignment at end of session and RockTape reapplied as pt feels that this helps stabilize alignment.    Personal Factors and Comorbidities  Comorbidity 3+;Past/Current Experience;Time since onset of injury/illness/exacerbation;Profession    Comorbidities  Acute on chronic LBP & SIJ  dysfunction; polyarthralgia; h/o obturator internus muscle injury; HTN; hypothyroidism; pre-diabetes; autoimmune disorder; Raynaud's disease; Crohn's disease    Examination-Activity Limitations  Caring for  Others;Lift;Sit;Locomotion Level    Examination-Participation Restrictions  Cleaning;Community Activity;Driving;Interpersonal Relationship;Laundry    Rehab Potential  Good    PT Frequency  2x / week    PT Duration  6 weeks    PT Treatment/Interventions  ADLs/Self Care Home Management;Cryotherapy;Electrical Stimulation;Iontophoresis 53m/ml Dexamethasone;Moist Heat;Traction;Ultrasound;Functional mobility training;Therapeutic activities;Therapeutic exercise;Balance training;Neuromuscular re-education;Patient/family education;Manual techniques;Passive range of motion;Dry needling;Taping;Spinal Manipulations;Joint Manipulations    PT Next Visit Plan  Lumbopelvic rhythm and stability, manual therapy for SIJ correction as indicated, KT tape as continued benefit noted    PT Home Exercise Plan  06/26/19 - posterior pelvic tilt, seated HS and piriformis stretches    Consulted and Agree with Plan of Care  Patient       Patient will benefit from skilled therapeutic intervention in order to improve the following deficits and impairments:  Decreased activity tolerance, Decreased knowledge of precautions, Decreased mobility, Decreased range of motion, Decreased strength, Difficulty walking, Hypomobility, Increased fascial restricitons, Increased muscle spasms, Impaired perceived functional ability, Impaired flexibility, Improper body mechanics, Postural dysfunction, Pain  Visit Diagnosis: Chronic bilateral low back pain without sciatica  Sacrococcygeal disorders, not elsewhere classified  Muscle weakness (generalized)     Problem List Patient Active Problem List   Diagnosis Date Noted  . Tremor 03/16/2019  . Prediabetes 02/09/2019  . Hypothyroidism (acquired) 08/02/2018  . Nonallopathic lesion of rib cage 10/08/2017  . Hyperglycemia 07/27/2017  . Antalgic gait 04/19/2017  . Polyarthralgia 02/26/2016  . GERD (gastroesophageal reflux disease) 02/17/2016  . Cervical lymphadenopathy 01/21/2016  .  Cough 09/14/2015  . Nonallopathic lesion of thoracic region 08/23/2015  . Lumbar radiculopathy 08/02/2015  . 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 Scapular dysfunction 03/20/2015  . 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 Nonallopathic lesion of lumbosacral region 06/29/2014  . Nonallopathic lesion of sacral region 06/29/2014  . Nonallopathic lesion of pelvic region 06/29/2014  . Recurrent UTI 05/18/2013  . Back pain, acute 10/07/2010  . Allergic rhinitis 08/14/2010  . Essential hypertension, benign 01/22/2010    JPercival Spanish PT, MPT 07/24/2019, 12:38 PM  [facility] [place] [address]Phone: [phone]  Fax:  [phone] Name: Lorraine HamorMRN: [id number] of Birth: [birth year]

## 2019-07-28 ENCOUNTER — Ambulatory Visit (INDEPENDENT_AMBULATORY_CARE_PROVIDER_SITE_OTHER): Payer: 59 | Admitting: Gastroenterology

## 2019-07-28 ENCOUNTER — Encounter: Payer: Self-pay | Admitting: Gastroenterology

## 2019-07-28 ENCOUNTER — Other Ambulatory Visit: Payer: Self-pay

## 2019-07-28 ENCOUNTER — Ambulatory Visit: Payer: 59

## 2019-07-28 VITALS — BP 124/80 | HR 80 | Ht 63.25 in | Wt 145.4 lb

## 2019-07-28 DIAGNOSIS — K5909 Other constipation: Secondary | ICD-10-CM

## 2019-07-28 DIAGNOSIS — M6281 Muscle weakness (generalized): Secondary | ICD-10-CM

## 2019-07-28 DIAGNOSIS — G8929 Other chronic pain: Secondary | ICD-10-CM | POA: Diagnosis not present

## 2019-07-28 DIAGNOSIS — R1013 Epigastric pain: Secondary | ICD-10-CM | POA: Diagnosis not present

## 2019-07-28 DIAGNOSIS — R140 Abdominal distension (gaseous): Secondary | ICD-10-CM

## 2019-07-28 DIAGNOSIS — M545 Low back pain: Secondary | ICD-10-CM | POA: Diagnosis not present

## 2019-07-28 DIAGNOSIS — M533 Sacrococcygeal disorders, not elsewhere classified: Secondary | ICD-10-CM

## 2019-07-28 NOTE — Patient Instructions (Signed)
If you are age 46 or older, your body mass index should be between 23-30. Your Body mass index is 25.55 kg/m. If this is out of the aforementioned range listed, please consider follow up with your Primary Care Provider.  If you are age 82 or younger, your body mass index should be between 19-25. Your Body mass index is 25.55 kg/m. If this is out of the aformentioned range listed, please consider follow up with your Primary Care Provider.   It was a pleasure to see you today!  Dr. Loletha Carrow

## 2019-07-28 NOTE — Progress Notes (Signed)
[facility] Progress Note  Chief Complaint: chronic constipation and bloating  Subjective  History: [name] was seen October 2020 for generalized chronic bloating and constipation.  It is been a problem for at least a decade but seem to be getting worse.  History and exam sounded like generalized decreased motility rather than pelvic floor dysfunction.  Screening colonoscopy not pursued because guidelines had not yet changed to recommend starting general population of 52.  Very recently those guidelines did change.  ED visit for abd pain late March.  Was on prednisone for arthralgias. On that occasion, she had acute onset severe epigastric pain with protracted vomiting.  Vomiting stopped after several hours and during the ED wait.  Her stomach felt unwell for about a week afterwards, then returned to normal.  She changed venlafaxine to vibryd recently and bowel habits have become more frequent with that.  Denies rectal bleeding.  ROS: Cardiovascular:  no chest pain Respiratory: no dyspnea Mood stable The patient's Past Medical, Family and Social History were reviewed and are on file in the EMR.  Objective:  Med list reviewed  Current Outpatient Medications:  .  acetaminophen (TYLENOL) 500 MG tablet, Take 1,000 mg by mouth every 6 (six) hours as needed for mild pain or headache., Disp: , Rfl:  .  Acetaminophen-Caffeine (EXCEDRIN TENSION HEADACHE) 500-65 MG TABS, Take 2 tablets by mouth daily as needed (headache)., Disp: , Rfl:  .  amLODipine (NORVASC) 5 MG tablet, Take 1 tablet (5 mg total) by mouth daily., Disp: 90 tablet, Rfl: 1 .  Cholecalciferol (VITAMIN D3) 2000 units TABS, Take 2,000 Units by mouth daily., Disp: , Rfl:  .  docusate sodium (COLACE) 100 MG capsule, Take 100 mg by mouth daily., Disp: , Rfl:  .  fluticasone (FLONASE) 50 MCG/ACT nasal spray, Place 2 sprays into both nostrils daily., Disp: 16 g, Rfl: 6 .  gabapentin (NEURONTIN) 100 MG capsule, TAKE 2 CAPSULES BY  MOUTH AT BEDTIME. (Patient taking differently: Take 100 mg by mouth at bedtime as needed (nerve pain). ), Disp: 180 capsule, Rfl: 1 .  levothyroxine (SYNTHROID) 50 MCG tablet, TAKE 1 TABLET (50 MCG TOTAL) BY MOUTH DAILY. (Patient taking differently: Take 50 mcg by mouth daily before breakfast. ), Disp: 90 tablet, Rfl: 0 .  loratadine (CLARITIN) 10 MG tablet, Take 10 mg by mouth daily., Disp: , Rfl:  .  multivitamin-lutein (OCUVITE-LUTEIN) CAPS capsule, Take 1 capsule by mouth daily., Disp: , Rfl:  .  nitrofurantoin (MACRODANTIN) 50 MG capsule, TAKE AFTER INTERCOURSE (Patient taking differently: Take 50 mg by mouth daily as needed (UTI prophylaxis). TAKE AFTER INTERCOURSE), Disp: 45 capsule, Rfl: 0 .  omeprazole (PRILOSEC) 20 MG capsule, Take 20 mg by mouth daily., Disp: , Rfl:  .  ondansetron (ZOFRAN ODT) 4 MG disintegrating tablet, Take 1 tablet (4 mg total) by mouth every 8 (eight) hours as needed for nausea or vomiting., Disp: 20 tablet, Rfl: 0 .  Prasterone, DHEA, (DHEA ADVANCED FORMULA PO), Take 1 tablet by mouth daily. , Disp: , Rfl:  .  Propylene Glycol (SYSTANE COMPLETE OP), Place 1 drop into both eyes daily., Disp: , Rfl:  .  traMADol (ULTRAM) 50 MG tablet, Take 1 tablet (50 mg total) by mouth every 12 (twelve) hours as needed for moderate pain., Disp: 30 tablet, Rfl: 0 .  Vilazodone HCl 10 & 20 MG KIT, Use as directed on box, Disp: 1 kit, Rfl: 0   Vital signs in last 24 hrs: Vitals:  07/28/19 1325  BP: 124/80  Pulse: 80    Physical Exam  Well-appearing  HEENT: sclera anicteric, oral mucosa moist without lesions  Neck: supple, no thyromegaly, JVD or lymphadenopathy  Cardiac: RRR without murmurs, S1S2 heard, no peripheral edema  Pulm: clear to auscultation bilaterally, normal RR and effort noted  Abdomen: soft, no tenderness, with active bowel sounds. No guarding or palpable hepatosplenomegaly.  Skin; warm and dry, no jaundice or rash  Labs:  CBC Latest Ref Rng & Units  06/05/2019 02/06/2019 08/02/2018  WBC 4.0 - 10.5 K/uL 21.9(H) 9.1 11.5(H)  Hemoglobin 12.0 - 15.0 g/dL 15.3(H) 14.0 14.2  Hematocrit 36.0 - 46.0 % 46.1(H) 41.7 41.0  Platelets 150 - 400 K/uL 346 336.0 328.0   CMP Latest Ref Rng & Units 06/05/2019 02/06/2019 08/02/2018  Glucose 70 - 99 mg/dL 115(H) 92 88  BUN 6 - 20 mg/dL 25(H) 18 19  Creatinine 0.44 - 1.00 mg/dL 0.80 0.74 0.78  Sodium 135 - 145 mmol/L 142 136 137  Potassium 3.5 - 5.1 mmol/L 3.5 3.4(L) 3.5  Chloride 98 - 111 mmol/L 104 99 97  CO2 22 - 32 mmol/L 26 30 31   Calcium 8.9 - 10.3 mg/dL 8.9 9.4 9.7  Total Protein 6.5 - 8.1 g/dL 7.0 7.1 7.1  Total Bilirubin 0.3 - 1.2 mg/dL 0.4 0.5 0.3  Alkaline Phos 38 - 126 U/L 74 71 78  AST 15 - 41 U/L 17 14 12   ALT 0 - 44 U/L 18 12 11     ___________________________________________ Radiologic studies: CLINICAL DATA:  Nausea and vomiting, upper abdominal pain, elevated white blood cell count   EXAM: CT ABDOMEN AND PELVIS WITH CONTRAST   TECHNIQUE: Multidetector CT imaging of the abdomen and pelvis was performed using the standard protocol following bolus administration of intravenous contrast.   CONTRAST:  16m OMNIPAQUE IOHEXOL 300 MG/ML  SOLN   COMPARISON:  None.   FINDINGS: Lower chest: No acute pleural or parenchymal lung disease.   Hepatobiliary: No focal liver abnormality is seen. Status post cholecystectomy. No biliary dilatation.   Pancreas: Unremarkable. No pancreatic ductal dilatation or surrounding inflammatory changes.   Spleen: Normal in size without focal abnormality.   Adrenals/Urinary Tract: Marked increased density of the bilateral medullary pyramids consistent with nephrocalcinosis. No obstructive uropathy within either kidney. There are numerous bilateral renal cortical and peripelvic cysts. The adrenals are unremarkable. Bladder is grossly normal.   Stomach/Bowel: No bowel obstruction or ileus. Normal retrocecal appendix. There is moderate stool  throughout the colon compatible with constipation. No bowel wall thickening or inflammatory changes.   Vascular/Lymphatic: No significant vascular findings are present. No enlarged abdominal or pelvic lymph nodes.   Reproductive: Uterus and bilateral adnexa are unremarkable. Nabothian cysts are incidentally noted within the cervix.   Other: No abdominal wall hernia or abnormality. No abdominopelvic ascites.   Musculoskeletal: No acute or destructive bony lesions. Reconstructed images demonstrate no additional findings.   IMPRESSION: 1. Moderate retained stool consistent with constipation. 2. Medullary nephrocalcinosis and bilateral renal cysts. 3. Otherwise no acute intra-abdominal or intrapelvic process.     Electronically Signed   By: MRanda NgoM.D.   On: 06/05/2019 15:29   ____________________________________________ Other:   _____________________________________________ Assessment & Plan  Assessment: Encounter Diagnoses  Name Primary?  . Chronic constipation Yes  . Abdominal bloating   . Acute epigastric pain    Stable chronic constipation, lately bowel habits have changed with new SSRI.  We discussed how those meds may affect GI motility, and there  may be some further evolution of that defect longer she is on the med. She had acute onset upper abdominal pain with protracted vomiting in late March.  CT ruled out cholecystitis, pancreatitis, obstruction.  Nobody else at home got sick that day with the same food, but it still sounds like it was some acute infectious illness.  Plan: Recommend a screening colonoscopy with a new guidelines.  After that, if constipation recurs as before requiring consideration of promotility agents such as Zelnorm or Motegrity, we will also then excluded any structural cause.  She believes she will wait till later this year to do it, and will contact us when she decides to proceed.   20 minutes were spent on this encounter (including  chart review, history/exam, counseling/coordination of care, and documentation)  Nelida Meuse III

## 2019-07-28 NOTE — Therapy (Signed)
[facility] [address]  [place], [place], [address] Phone: [phone]   Fax:  [phone]  Physical Therapy Treatment  Patient Details  Name: [name] MRN: [id number] Date of Birth: Nov 01, 1975 Referring Provider (PT): Lyndal Pulley, DO   Encounter Date: 07/28/2019  PT End of Session - 07/28/19 1022    Visit Number  9    Number of Visits  12    Date for PT Re-Evaluation  08/07/19    Authorization Type  [facility] - UMR    PT Start Time  1016    PT Stop Time  1100    PT Time Calculation (min)  44 min    Activity Tolerance  Patient tolerated treatment well    Behavior During Therapy  [facility] for tasks assessed/performed       Past Medical History:  Diagnosis Date  . Allergy   . Autoimmune disorder ([place])   . Crohn's disease ([place])   . Endometriosis   . Hypertension   . Kidney, medullary sponge   . Ovarian cyst   . Pre-eclampsia   . Raynaud's disease   . Stress incontinence in female     Past Surgical History:  Procedure Laterality Date  . CHOLECYSTECTOMY    . DILATION AND CURETTAGE OF UTERUS    . ENDOMETRIAL ABLATION    . NOVASURE ABLATION    . TUBAL LIGATION      There were no vitals filed for this visit.  Subjective Assessment - 07/28/19 1019    Subjective  Pt. reporting increased LBP after working on Wednesday.    Diagnostic tests  08/11/15 - Lumbar MRI: 1. Slight progression of leftward disc protrusion at L5-S1 results in a mild left subarticular stenosis with probable contact of the traversing left S1 nerve root.  2. Slight progression of mild bilateral facet hypertrophy at L4-5 without significant stenosis.    Patient Stated Goals  "Increase core & R hip strength and decrease back pain"    Currently in Pain?  Yes    Pain Score  5     Pain Location  Sacrum    Pain Orientation  Left;Lateral    Pain Descriptors / Indicators  Dull;Shooting    Pain Type  Acute pain;Chronic pain    Pain  Radiating Towards  Dull ache in mid back and shooting pain down L lower back into L buttocks and into L lateral LE to knee    Pain Onset  More than a month ago    Pain Frequency  Intermittent   LBP/sacrum pain constant, shooting L LE pain intermittent   Aggravating Factors   Prolonged sitting in car    Pain Relieving Factors  Tylenol arthitis, neurontin    Multiple Pain Sites  No                        [facility] Adult PT Treatment/Exercise - 07/28/19 0001      Lumbar Exercises: Stretches   Piriformis Stretch  Left;2 reps;30 seconds    Piriformis Stretch Limitations  KTOS      Lumbar Exercises: Aerobic   Nustep  L4 x 6 min (UE/LE)      Lumbar Exercises: Supine   Dead Bug  15 reps;3 seconds    Dead Bug Limitations  UE/LE from tabletop position    Bridge  3 seconds;10 reps    Bridge Limitations  Sustained bridge + march x 5 each LE  Bridge with clamshell  10 reps    Bridge with [employer] Limitations  sustained bridge + alteranting clam shell x 5 reps into green TB at knees each side       Lumbar Exercises: Sidelying   Other Sidelying Lumbar Exercises  R/L side plank elbows to knees 10 x 5"      Knee/Hip Exercises: Standing   Functional Squat  15 reps;3 seconds    Functional Squat Limitations  green TB at thighs       Manual Therapy   Manual Therapy  Taping      Kinesiotix   Ligament Correction  SIJ support - 50% horizontally across B PSIS + 50% stars over B SIJ   [place] RockTape             PT Education - 07/28/19 1203    Education Details  HEP update;  side plank    Person(s) Educated  Patient    Methods  Explanation;Demonstration;Verbal cues;Handout    Comprehension  Verbalized understanding;Returned demonstration;Verbal cues required       PT Short Term Goals - 07/13/19 0809      PT SHORT TERM GOAL #1   Title  Patient will be independent with initial HEP    Status  Achieved   06/28/19     PT SHORT TERM GOAL #2   Title  Patient will  verbalize/demonstrate understanding of neutral spine posture and proper body mechanics to reduce strain on lumbar spine    Status  Achieved   07/13/19       PT Long Term Goals - 07/13/19 0810      PT LONG TERM GOAL #1   Title  Patient will be independent with ongoing/advanced HEP    Status  On-going    Target Date  08/07/19      PT LONG TERM GOAL #2   Title  Patient to demonstrate appropriate posture and body mechanics needed for daily activities    Status  On-going    Target Date  08/07/19      PT LONG TERM GOAL #3   Title  Patient to improve lumabr AROM to WNL/WFL without pain provocation    Status  On-going    Target Date  08/07/19      PT LONG TERM GOAL #4   Title  Patient will demonstrate improved B LE strength to >/= 4+/5 for improved stability and ease of mobility    Status  On-going    Target Date  08/07/19      PT LONG TERM GOAL #5   Title  Patient will complete household chores and tolerate 12 hr work shift with pain no greater than 3-4/10    Status  On-going    Target Date  08/07/19            Plan - 07/28/19 1023    Clinical Impression Statement  [place] reporting significant increase in LBP/L buttocks/thigh pain after long work shift yesterday.  Noted good reduction in LBP just after NuStep warmup, reapplication of Rock Taping to B SIJ thus proceeded with progression of lumbopelvic strengthening activities which were well tolerated.  Pt. leaving session noting good reduction in pain and requesting update of HEP to add side planking thus issued handout for this activity.    Comorbidities  Acute on chronic LBP & SIJ dysfunction; polyarthralgia; h/o obturator internus muscle injury; HTN; hypothyroidism; pre-diabetes; autoimmune disorder; Raynaud's disease; Crohn's disease    Rehab Potential  Good    PT Treatment/Interventions  ADLs/Self Care Home Management;Cryotherapy;Electrical Stimulation;Iontophoresis 36m/ml Dexamethasone;Moist Heat;Traction;Ultrasound;Functional  mobility training;Therapeutic activities;Therapeutic exercise;Balance training;Neuromuscular re-education;Patient/family education;Manual techniques;Passive range of motion;Dry needling;Taping;Spinal Manipulations;Joint Manipulations    PT Next Visit Plan  Lumbopelvic rhythm and stability, manual therapy for SIJ correction as indicated, KT tape as continued benefit noted    PT Home Exercise Plan  06/26/19 - posterior pelvic tilt, seated HS and piriformis stretches; 07/28/19: side planking    Consulted and Agree with Plan of Care  Patient       Patient will benefit from skilled therapeutic intervention in order to improve the following deficits and impairments:  Decreased activity tolerance, Decreased knowledge of precautions, Decreased mobility, Decreased range of motion, Decreased strength, Difficulty walking, Hypomobility, Increased fascial restricitons, Increased muscle spasms, Impaired perceived functional ability, Impaired flexibility, Improper body mechanics, Postural dysfunction, Pain  Visit Diagnosis: Chronic bilateral low back pain without sciatica  Sacrococcygeal disorders, not elsewhere classified  Muscle weakness (generalized)     Problem List Patient Active Problem List   Diagnosis Date Noted  . Tremor 03/16/2019  . Prediabetes 02/09/2019  . Hypothyroidism (acquired) 08/02/2018  . Nonallopathic lesion of rib cage 10/08/2017  . Hyperglycemia 07/27/2017  . Antalgic gait 04/19/2017  . Polyarthralgia 02/26/2016  . GERD (gastroesophageal reflux disease) 02/17/2016  . Cervical lymphadenopathy 01/21/2016  . Cough 09/14/2015  . Nonallopathic lesion of thoracic region 08/23/2015  . Lumbar radiculopathy 08/02/2015  . 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 Scapular dysfunction 03/20/2015  . 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 Nonallopathic lesion of lumbosacral region 06/29/2014  . Nonallopathic lesion of sacral  region 06/29/2014  . Nonallopathic lesion of pelvic region 06/29/2014  . Recurrent UTI 05/18/2013  . Back pain, acute 10/07/2010  . Allergic rhinitis 08/14/2010  . Essential hypertension, benign 01/22/2010    MBess Harvest PTA 07/28/19 12:07 PM   [facility] [address] [place] [address]Phone: [phone]  Fax:  [phone] Name: EDeirdre GryderMRN: [id number] of Birth: 3October 03, 1977

## 2019-08-01 ENCOUNTER — Encounter: Payer: Self-pay | Admitting: Family Medicine

## 2019-08-02 ENCOUNTER — Other Ambulatory Visit: Payer: Self-pay

## 2019-08-02 ENCOUNTER — Ambulatory Visit: Payer: 59 | Admitting: Physical Therapy

## 2019-08-02 ENCOUNTER — Encounter: Payer: Self-pay | Admitting: Physical Therapy

## 2019-08-02 DIAGNOSIS — G8929 Other chronic pain: Secondary | ICD-10-CM

## 2019-08-02 DIAGNOSIS — M6281 Muscle weakness (generalized): Secondary | ICD-10-CM | POA: Diagnosis not present

## 2019-08-02 DIAGNOSIS — M533 Sacrococcygeal disorders, not elsewhere classified: Secondary | ICD-10-CM | POA: Diagnosis not present

## 2019-08-02 DIAGNOSIS — M545 Low back pain: Secondary | ICD-10-CM | POA: Diagnosis not present

## 2019-08-02 NOTE — Therapy (Signed)
[facility] [address]  [place], [place], [address] Phone: [phone]   Fax:  [phone]  Physical Therapy Treatment / Progress Note / Recert  Patient Details  Name: [name] MRN: [id number] Date of Birth: [birth year] Referring Provider (PT): Lorraine Pulley, DO  Progress Note  Reporting Period 06/26/2019 to 08/02/2019  See note below for Objective Data and Assessment of Progress/Goals.     Encounter Date: 08/02/2019  PT End of Session - 08/02/19 0937    Visit Number  10    Number of Visits  22    Date for PT Re-Evaluation  09/13/19    Authorization Type  [facility] - [facility]    PT Start Time  [phone]   Pt arrived late   PT Stop Time  1019    PT Time Calculation (min)  42 min    Activity Tolerance  Patient tolerated treatment well    Behavior During Therapy  [facility] for tasks assessed/performed       Past Medical History:  Diagnosis Date  . Allergy   . Autoimmune disorder ([place])   . Crohn's disease ([place])   . Endometriosis   . Hypertension   . Kidney, medullary sponge   . Ovarian cyst   . Pre-eclampsia   . Raynaud's disease   . Stress incontinence in female     Past Surgical History:  Procedure Laterality Date  . CHOLECYSTECTOMY    . DILATION AND CURETTAGE OF UTERUS    . ENDOMETRIAL ABLATION    . NOVASURE ABLATION    . TUBAL LIGATION      There were no vitals filed for this visit.  Subjective Assessment - 08/02/19 0940    Subjective  Pt reporting she is going in for a steriod and toradol injection on Friday - feels that her "body is on the warpath and my inflammation is really high".    Diagnostic tests  08/11/15 - Lumbar MRI: 1. Slight progression of leftward disc protrusion at L5-S1 results in a mild left subarticular stenosis with probable contact of the traversing left S1 nerve root.  2. Slight progression of mild bilateral facet hypertrophy at L4-5 without significant stenosis.    Patient  Stated Goals  "Increase core & R hip strength and decrease back pain"    Currently in Pain?  Yes    Pain Score  4     Pain Location  Sacrum    Pain Orientation  Left    Pain Descriptors / Indicators  Aching   + pinching at pubic symphasis   Pain Type  Acute pain;Chronic pain    Pain Frequency  Constant         [facility] PT Assessment - 08/02/19 0937      Assessment   Medical Diagnosis  Chronic LBP, R SIJ pain    Referring Provider (PT)  Lorraine Pulley, DO    Onset Date/Surgical Date  06/07/19    Next MD Visit  08/04/19      Observation/Other Assessments   Focus on Therapeutic Outcomes (FOTO)   Lumbar - 58% (42% limitation)      AROM   Lumbar Flexion  back of fingers to floor - tightness    Lumbar Extension  25% limited - midline sacrum soreness/tightness    Lumbar - Right Side Bend  hand to fibular head - mild lateral trunk pain    Lumbar - Left Side Bend  hand to lateral joint line of  knee - mild lateral trunk pain    Lumbar - Right Rotation  10% limited - tightness    Lumbar - Left Rotation  10% limited - tightness      Strength   Right Hip Flexion  4+/5    Right Hip Extension  4/5    Right Hip External Rotation   4/5    Right Hip Internal Rotation  4+/5    Right Hip ABduction  4/5    Right Hip ADduction  4/5    Left Hip Flexion  4+/5    Left Hip Extension  4/5    Left Hip External Rotation  4/5    Left Hip Internal Rotation  4+/5    Left Hip ABduction  4/5    Left Hip ADduction  4/5    Right Knee Flexion  4+/5    Right Knee Extension  4+/5    Left Knee Flexion  4+/5    Left Knee Extension  4+/5    Right Ankle Dorsiflexion  4+/5    Right Ankle Plantar Flexion  4+/5    Left Ankle Dorsiflexion  4+/5    Left Ankle Plantar Flexion  4+/5      Flexibility   Hamstrings  B very mild tightness    ITB  WFL    Piriformis  B very mild tightness                    [facility] Adult PT Treatment/Exercise - 08/02/19 0937      Lumbar Exercises: Aerobic   Nustep  L4  x 6 min (UE/LE)   seat #7     Manual Therapy   Manual Therapy  Taping      Kinesiotix   Ligament Correction  SIJ support - 30-50% horizontally across B PSIS + 50% stars over B SIJ   camo RockTape              PT Short Term Goals - 07/13/19 0809      PT SHORT TERM GOAL #1   Title  Patient will be independent with initial HEP    Status  Achieved   06/28/19     PT SHORT TERM GOAL #2   Title  Patient will verbalize/demonstrate understanding of neutral spine posture and proper body mechanics to reduce strain on lumbar spine    Status  Achieved   07/13/19       PT Long Term Goals - 08/02/19 0947      PT LONG TERM GOAL #1   Title  Patient will be independent with ongoing/advanced HEP    Status  Partially Met   08/02/19 - met for current HEP   Target Date  09/13/19      PT LONG TERM GOAL #2   Title  Patient to demonstrate appropriate posture and body mechanics needed for daily activities    Status  Achieved   08/02/19     PT LONG TERM GOAL #3   Title  Patient to improve lumabr AROM to WNL/WFL without pain provocation    Status  Partially Met    Target Date  09/13/19      PT LONG TERM GOAL #4   Title  Patient will demonstrate improved B LE strength to >/= 4+/5 for improved stability and ease of mobility    Status  Partially Met    Target Date  09/13/19      PT LONG TERM GOAL #5   Title  Patient will complete household  chores and tolerate 12 hr work shift with pain no greater than 3-4/10    Status  On-going   08/02/19 - pain still up to 6-7/10 with these activities   Target Date  09/13/19            Plan - 08/02/19 [name] feels that PT is helping, with 40% improvement to date, but feels hindered by intermittent need for SIJ adjustment and/or manual therapy to address acute exacerbations of muscle pain. She notes plan for steroid and tramadol injections on Friday to hopefully reduced current inflammatory process and plans to  discuss potential referral to rheumatology or other special to address ongoing inflammatory issues. With this addressed, she feels that if we could continue focus on strengthening and stabilization exercises, things would continue to improve. [name] has been able to maintain neutral SIJ alignment over past 3 visits and notes improving awareness of posture and body mechanics with most activities, although does admit to still having to correct herself from sitting with her leg folded under her at times while at work. She is demonstrating improving lumbar ROM and LE strength but continues to demonstrate greatest weakness proximally in LEs and core musculature. All STGs and LTG #2 now met with remaining LTGs partially met. Patient and PT agree continued therapy for core and lumbopelvic strengthening and stability would be beneficial to reduce pain and risk for future SIJ dysfunction to allow for improved activity tolerance w/o pain interference, therefore will recommend recert for additional 2x/wk for 4-6 weeks.    Personal Factors and Comorbidities  Comorbidity 3+;Past/Current Experience;Time since onset of injury/illness/exacerbation;Profession    Comorbidities  Acute on chronic LBP & SIJ dysfunction; polyarthralgia; h/o obturator internus muscle injury; HTN; hypothyroidism; pre-diabetes; autoimmune disorder; Raynaud's disease; Crohn's disease    Examination-Activity Limitations  Caring for Others;Lift;Sit;Locomotion Level    Examination-Participation Restrictions  Cleaning;Community Activity;Driving;Interpersonal Relationship;Laundry    Rehab Potential  Good    PT Frequency  2x / week    PT Duration  6 weeks   4-6 weeks   PT Treatment/Interventions  ADLs/Self Care Home Management;Cryotherapy;Electrical Stimulation;Iontophoresis 48m/ml Dexamethasone;Moist Heat;Traction;Ultrasound;Functional mobility training;Therapeutic activities;Therapeutic exercise;Balance training;Neuromuscular re-education;Patient/family  education;Manual techniques;Passive range of motion;Dry needling;Taping;Spinal Manipulations;Joint Manipulations    PT Next Visit Plan  Lumbopelvic rhythm, strengthening and stability, manual therapy for SIJ correction as indicated, KT tape as continued benefit noted    PT Home Exercise Plan  06/26/19 - posterior pelvic tilt, seated HS and piriformis stretches; 5/10 - red TB side lying clam, dead bug, quadruped hip extension, red TB side stepping & B pallof press; 5/21 - side planking    Consulted and Agree with Plan of Care  Patient       Patient will benefit from skilled therapeutic intervention in order to improve the following deficits and impairments:  Decreased activity tolerance, Decreased knowledge of precautions, Decreased mobility, Decreased range of motion, Decreased strength, Difficulty walking, Hypomobility, Increased fascial restricitons, Increased muscle spasms, Impaired perceived functional ability, Impaired flexibility, Improper body mechanics, Postural dysfunction, Pain  Visit Diagnosis: Chronic bilateral low back pain without sciatica  Sacrococcygeal disorders, not elsewhere classified  Muscle weakness (generalized)     Problem List Patient Active Problem List   Diagnosis Date Noted  . Tremor 03/16/2019  . Prediabetes 02/09/2019  . Hypothyroidism (acquired) 08/02/2018  . Nonallopathic lesion of rib cage 10/08/2017  . Hyperglycemia 07/27/2017  . Antalgic gait 04/19/2017  . Polyarthralgia 02/26/2016  . GERD (gastroesophageal reflux disease) 02/17/2016  .  Cervical lymphadenopathy 01/21/2016  . Cough 09/14/2015  . Nonallopathic lesion of thoracic region 08/23/2015  . Lumbar radiculopathy 08/02/2015  . 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 Scapular dysfunction 03/20/2015  . 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 Nonallopathic lesion of lumbosacral region 06/29/2014  . Nonallopathic lesion of sacral region  06/29/2014  . Nonallopathic lesion of pelvic region 06/29/2014  . Recurrent UTI 05/18/2013  . Back pain, acute 10/07/2010  . Allergic rhinitis 08/14/2010  . Essential hypertension, benign 01/22/2010    Percival Spanish, PT, MPT 08/02/2019, 1:59 PM  [facility] [address]  [place], [place], [address] Phone: [phone]   Fax:  [phone]  Name: [name] MRN: [id number] Date of Birth: 05-22-1975

## 2019-08-04 ENCOUNTER — Encounter: Payer: Self-pay | Admitting: Family Medicine

## 2019-08-04 ENCOUNTER — Ambulatory Visit (INDEPENDENT_AMBULATORY_CARE_PROVIDER_SITE_OTHER): Payer: 59 | Admitting: Family Medicine

## 2019-08-04 VITALS — BP 110/80 | HR 66 | Ht 63.0 in | Wt 146.0 lb

## 2019-08-04 DIAGNOSIS — M2550 Pain in unspecified joint: Secondary | ICD-10-CM | POA: Diagnosis not present

## 2019-08-04 MED ORDER — COLCHICINE 0.6 MG PO TABS: 0.6000 mg | ORAL_TABLET | Freq: Two times a day (BID) | ORAL | 0 refills | Status: DC

## 2019-08-04 MED ORDER — KETOROLAC TROMETHAMINE 60 MG/2ML IM SOLN
60.0000 mg | Freq: Once | INTRAMUSCULAR | Status: AC
  Administered 2019-08-04: 60 mg via INTRAMUSCULAR

## 2019-08-04 MED ORDER — METHYLPREDNISOLONE ACETATE 80 MG/ML IJ SUSP
80.0000 mg | Freq: Once | INTRAMUSCULAR | Status: AC
  Administered 2019-08-04: 80 mg via INTRAMUSCULAR

## 2019-08-04 MED FILL — COLCHICINE 0.6 MG TABS: 0.6 | 5 days supply | Qty: 10 | Fill #0

## 2019-08-04 NOTE — Assessment & Plan Note (Signed)
Significant pain out of proportion to the amount of palpation.  Toradol and Depo-Medrol given today.  We discussed with patient again at great length and spent over 31 minutes with patient today.  We discussed different medication changes.  Patient does have tramadol for breakthrough pain.  Gabapentin encouraged to take on a more regular basis.  Encouraged her to give more time to the Vilazodone and see if this will be beneficial in the long run.  Follow-up with me again in 2-4 weeks

## 2019-08-04 NOTE — Patient Instructions (Addendum)
Colchicine 2 times a day for the next 5 days Give the other medicine more time Labs today See you as scheduled

## 2019-08-04 NOTE — Progress Notes (Signed)
[facility] [place] Phone: [phone] Subjective:   [name], am serving as a scribe for Dr. Hulan Saas. This visit occurred during the SARS-CoV-2 public health emergency.  Safety protocols were in place, including screening questions prior to the visit, additional usage of staff PPE, and extensive cleaning of exam room while observing appropriate contact time as indicated for disinfecting solutions.   I'm seeing this patient by the request  of:  Binnie Rail, MD  CC: Back pain and allover pain follow-up  EQA:[id number]  [name] is a 44 y.o. female coming in with complaint of low back pain. Last seen on 07/07/2019 for OMT. Patient has been doing physical therapy. Patient states that therapy is helping but systemically she is having an increase in inflammation.  Patient feels like the pain is unrelenting.  Not giving up at this moment.  States that the pain is severe enough that is stopping her from activity and patient has even had to almost call out of work a couple times.  Patient feels that the medications do not seem to alleviate the pain ever entirely.  Has seen multiple providers for this and still having difficulty with the true diagnosis.     Past Medical History:  Diagnosis Date  . Allergy   . Autoimmune disorder ([place])   . Crohn's disease ([place])   . Endometriosis   . Hypertension   . Kidney, medullary sponge   . Ovarian cyst   . Pre-eclampsia   . Raynaud's disease   . Stress incontinence in female    Past Surgical History:  Procedure Laterality Date  . CHOLECYSTECTOMY    . DILATION AND CURETTAGE OF UTERUS    . ENDOMETRIAL ABLATION    . NOVASURE ABLATION    . TUBAL LIGATION     Social History   Socioeconomic History  . Marital status: Married    Spouse name: Not on file  . Number of children: 3  . Years of education: Not on file  . Highest education level: Not on file    Occupational History  . Occupation: Programmer, multimedia: [employer]  Tobacco Use  . Smoking status: Never Smoker  . Smokeless tobacco: Never Used  Substance and Sexual Activity  . Alcohol use: Yes  . Drug use: No  . Sexual activity: Yes  Other Topics Concern  . 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 Difficulty of Paying Living Expenses:   Food Insecurity:   . Worried About Charity fundraiser in the Last Year:   . Arboriculturist in the Last Year:   Transportation Needs:   . Film/video editor (Medical):   Marland Kitchen Lack of Transportation (Non-Medical):   Physical Activity:   . Days of Exercise per Week:   . Minutes of Exercise per Session:   Stress:   . Feeling of Stress :   Social Connections:   . Frequency of Communication with Friends and Family:   . Frequency of Social Gatherings with Friends and Family:   . Attends Religious Services:   . Active Member of Clubs or Organizations:   . Attends Archivist Meetings:   Marland Kitchen Marital Status:    Allergies  Allergen Reactions  . Lamisil [Terbinafine] Hives and Itching  In pill form   Family History  Problem Relation Age of Onset  . Breast cancer Mother   . Hypertension Father   . Hyperlipidemia Father   . Other Brother        lyme-chronic  . Stroke Maternal Grandmother   . Alcohol abuse Paternal Grandmother   . Heart disease Paternal Grandfather   . Colon cancer Neg Hx   . Esophageal cancer Neg Hx   . Rectal cancer Neg Hx     Current Outpatient Medications (Endocrine & Metabolic):  .  levothyroxine (SYNTHROID) 50 MCG tablet, TAKE 1 TABLET (50 MCG TOTAL) BY MOUTH DAILY. (Patient taking differently: Take 50 mcg by mouth daily before breakfast. )  Current Outpatient Medications (Cardiovascular):  .  amLODipine (NORVASC) 5 MG tablet, Take 1 tablet (5 mg total) by mouth daily.  Current Outpatient  Medications (Respiratory):  .  fluticasone (FLONASE) 50 MCG/ACT nasal spray, Place 2 sprays into both nostrils daily. Marland Kitchen  loratadine (CLARITIN) 10 MG tablet, Take 10 mg by mouth daily.  Current Outpatient Medications (Analgesics):  .  acetaminophen (TYLENOL) 500 MG tablet, Take 1,000 mg by mouth every 6 (six) hours as needed for mild pain or headache. .  Acetaminophen-Caffeine (EXCEDRIN TENSION HEADACHE) 500-65 MG TABS, Take 2 tablets by mouth daily as needed (headache). .  traMADol (ULTRAM) 50 MG tablet, Take 1 tablet (50 mg total) by mouth every 12 (twelve) hours as needed for moderate pain. Marland Kitchen  colchicine 0.6 MG tablet, Take 1 tablet (0.6 mg total) by mouth 2 (two) times daily.   Current Outpatient Medications (Other):  Marland Kitchen  Cholecalciferol (VITAMIN D3) 2000 units TABS, Take 2,000 Units by mouth daily. Marland Kitchen  docusate sodium (COLACE) 100 MG capsule, Take 100 mg by mouth daily. Marland Kitchen  gabapentin (NEURONTIN) 100 MG capsule, TAKE 2 CAPSULES BY MOUTH AT BEDTIME. (Patient taking differently: Take 100 mg by mouth at bedtime as needed (nerve pain). ) .  multivitamin-lutein (OCUVITE-LUTEIN) CAPS capsule, Take 1 capsule by mouth daily. .  nitrofurantoin (MACRODANTIN) 50 MG capsule, TAKE AFTER INTERCOURSE (Patient taking differently: Take 50 mg by mouth daily as needed (UTI prophylaxis). TAKE AFTER INTERCOURSE) .  omeprazole (PRILOSEC) 20 MG capsule, Take 20 mg by mouth daily. .  ondansetron (ZOFRAN ODT) 4 MG disintegrating tablet, Take 1 tablet (4 mg total) by mouth every 8 (eight) hours as needed for nausea or vomiting. .  Prasterone, DHEA, (DHEA ADVANCED FORMULA PO), Take 1 tablet by mouth daily.  Marland Kitchen  Propylene Glycol (SYSTANE COMPLETE OP), Place 1 drop into both eyes daily. .  Vilazodone HCl 10 & 20 MG KIT, Use as directed on box   Reviewed prior external information including notes and imaging from  primary care provider As well as notes that were available from care everywhere and other healthcare  systems.  Past medical history, social, surgical and family history all reviewed in electronic medical record.  No pertanent information unless stated regarding to the chief complaint.   Review of Systems:  No headache, visual changes, nausea, vomiting, diarrhea, constipation, dizziness, abdominal pain, skin rash, fevers, chills, night sweats, weight loss, swollen lymph nodes, body aches, joint swelling, chest pain, shortness of breath, mood changes. POSITIVE muscle aches  Objective  Blood pressure 110/80, pulse 66, height 5' 3"  (1.6 m), weight 146 lb (66.2 kg), SpO2 99 %.   General: No apparent distress alert and oriented x3 mood and affect normal, dressed appropriately.  HEENT: Pupils equal, extraocular movements intact  Respiratory: Patient's speak in full sentences  and does not appear short of breath  Cardiovascular: No lower extremity edema, non tender, no erythema  Neuro: Cranial nerves II through XII are intact, neurovascularly intact in all extremities with 2+ DTRs and 2+ pulses.  Gait normal with good balance and coordination.  MSK:  tender with mild limited range of motion and good stability and symmetric strength and tone of shoulders, elbows, wrist, hip, knee and ankles bilaterally.  Patient's pain is out of proportion to the amount of palpation.   Impression and Recommendations:     This case required medical decision making of moderate complexity. The above documentation has been reviewed and is accurate and complete Lyndal Pulley, DO       Note: This dictation was prepared with Dragon dictation along with smaller phrase technology. Any transcriptional errors that result from this process are unintentional.

## 2019-08-06 NOTE — Progress Notes (Signed)
Subjective:    Patient ID: [name], female    DOB: February 18, 1976, 44 y.o.   MRN: [id number]  HPI The patient is here for follow up of their chronic medical problems, including hypertension, hypothyroidism, prediabetes, polyarthralgia, recurrent UTI  She is taking all of her medications as prescribed.    She is exercising regularly.   She walks.     Medications and allergies reviewed with patient and updated if appropriate.  Patient Active Problem List   Diagnosis Date Noted  . Tremor 03/16/2019  . Prediabetes 02/09/2019  . Hypothyroidism (acquired) 08/02/2018  . Nonallopathic lesion of rib cage 10/08/2017  . Hyperglycemia 07/27/2017  . Antalgic gait 04/19/2017  . Polyarthralgia 02/26/2016  . GERD (gastroesophageal reflux disease) 02/17/2016  . Cervical lymphadenopathy 01/21/2016  . Cough 09/14/2015  . Nonallopathic lesion of thoracic region 08/23/2015  . Lumbar radiculopathy 08/02/2015  . 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 Scapular dysfunction 03/20/2015  . 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 Nonallopathic lesion of lumbosacral region 06/29/2014  . Nonallopathic lesion of sacral region 06/29/2014  . Nonallopathic lesion of pelvic region 06/29/2014  . Recurrent UTI 05/18/2013  . Back pain, acute 10/07/2010  . Allergic rhinitis 08/14/2010  . Essential hypertension, benign 01/22/2010    Current Outpatient Medications on File Prior to Visit  Medication Sig Dispense Refill  . acetaminophen (TYLENOL) 500 MG tablet Take 1,000 mg by mouth every 6 (six) hours as needed for mild pain or headache.    . Acetaminophen-Caffeine (EXCEDRIN TENSION HEADACHE) 500-65 MG TABS Take 2 tablets by mouth daily as needed (headache).    Marland Kitchen amLODipine (NORVASC) 5 MG tablet Take 1 tablet (5 mg total) by mouth daily. 90 tablet 1  . Cholecalciferol (VITAMIN D3) 2000 units TABS Take 2,000 Units by mouth daily.    .  colchicine 0.6 MG tablet Take 1 tablet (0.6 mg total) by mouth 2 (two) times daily. 10 tablet 0  . docusate sodium (COLACE) 100 MG capsule Take 100 mg by mouth daily.    . fluticasone (FLONASE) 50 MCG/ACT nasal spray Place 2 sprays into both nostrils daily. 16 g 6  . gabapentin (NEURONTIN) 100 MG capsule TAKE 2 CAPSULES BY MOUTH AT BEDTIME. (Patient taking differently: Take 100 mg by mouth at bedtime as needed (nerve pain). ) 180 capsule 1  . levothyroxine (SYNTHROID) 50 MCG tablet TAKE 1 TABLET (50 MCG TOTAL) BY MOUTH DAILY. (Patient taking differently: Take 50 mcg by mouth daily before breakfast. ) 90 tablet 0  . loratadine (CLARITIN) 10 MG tablet Take 10 mg by mouth daily.    . multivitamin-lutein (OCUVITE-LUTEIN) CAPS capsule Take 1 capsule by mouth daily.    . nitrofurantoin (MACRODANTIN) 50 MG capsule TAKE AFTER INTERCOURSE (Patient taking differently: Take 50 mg by mouth daily as needed (UTI prophylaxis). TAKE AFTER INTERCOURSE) 45 capsule 0  . omeprazole (PRILOSEC) 20 MG capsule Take 20 mg by mouth daily.    . ondansetron (ZOFRAN ODT) 4 MG disintegrating tablet Take 1 tablet (4 mg total) by mouth every 8 (eight) hours as needed for nausea or vomiting. 20 tablet 0  . Prasterone, DHEA, (DHEA ADVANCED FORMULA PO) Take 1 tablet by mouth daily.     Marland Kitchen Propylene Glycol (SYSTANE COMPLETE OP) Place 1 drop into both eyes daily.    . traMADol (ULTRAM) 50 MG tablet Take 1 tablet (50 mg total) by mouth every 12 (twelve) hours as needed for moderate pain. [place]  tablet 0  . Vilazodone HCl 10 & 20 MG KIT Use as directed on box 1 kit 0   No current facility-administered medications on file prior to visit.    Past Medical History:  Diagnosis Date  . Allergy   . Autoimmune disorder ([place])   . Crohn's disease ([place])   . Endometriosis   . Hypertension   . Kidney, medullary sponge   . Ovarian cyst   . Pre-eclampsia   . Raynaud's disease   . Stress incontinence in female     Past Surgical History:    Procedure Laterality Date  . CHOLECYSTECTOMY    . DILATION AND CURETTAGE OF UTERUS    . ENDOMETRIAL ABLATION    . NOVASURE ABLATION    . TUBAL LIGATION      Social History   Socioeconomic History  . Marital status: Married    Spouse name: Not on file  . Number of children: 3  . Years of education: Not on file  . Highest education level: Not on file  Occupational History  . Occupation: Programmer, multimedia: [employer]  Tobacco Use  . Smoking status: Never Smoker  . Smokeless tobacco: Never Used  Substance and Sexual Activity  . Alcohol use: Yes  . Drug use: No  . Sexual activity: Yes  Other Topics Concern  . 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 Difficulty of Paying Living Expenses:   Food Insecurity:   . Worried About Charity fundraiser in the Last Year:   . Arboriculturist in the Last Year:   Transportation Needs:   . Film/video editor (Medical):   Marland Kitchen Lack of Transportation (Non-Medical):   Physical Activity:   . Days of Exercise per Week:   . Minutes of Exercise per Session:   Stress:   . Feeling of Stress :   Social Connections:   . Frequency of Communication with Friends and Family:   . Frequency of Social Gatherings with Friends and Family:   . Attends Religious Services:   . Active Member of Clubs or Organizations:   . Attends Archivist Meetings:   Marland Kitchen Marital Status:     Family History  Problem Relation Age of Onset  . Breast cancer Mother   . Hypertension Father   . Hyperlipidemia Father   . Other Brother        lyme-chronic  . Stroke Maternal Grandmother   . Alcohol abuse Paternal Grandmother   . Heart disease Paternal Grandfather   . Colon cancer Neg Hx   . Esophageal cancer Neg Hx   . Rectal cancer Neg Hx     Review of Systems     Objective:  There were no vitals filed for this visit. BP Readings from Last 3  Encounters:  08/04/19 110/80  07/28/19 124/80  07/07/19 100/76   Wt Readings from Last 3 Encounters:  08/04/19 146 lb (66.2 kg)  07/28/19 145 lb 6 oz (65.9 kg)  07/07/19 143 lb (64.9 kg)   There is no height or weight on file to calculate BMI.   Physical Exam    Constitutional: Appears well-developed and well-nourished. No distress.  HENT:  Head: Normocephalic and atraumatic.  Neck: Neck supple. No tracheal deviation present. No thyromegaly present.  No cervical lymphadenopathy Cardiovascular: Normal rate, regular rhythm and normal heart  sounds.   No murmur heard. No carotid bruit .  No edema Pulmonary/Chest: Effort normal and breath sounds normal. No respiratory distress. No has no wheezes. No rales.  Skin: Skin is warm and dry. Not diaphoretic.  Psychiatric: Normal mood and affect. Behavior is normal.      Assessment & Plan:    See Problem List for Assessment and Plan of chronic medical problems.    This visit occurred during the SARS-CoV-2 public health emergency.  Safety protocols were in place, including screening questions prior to the visit, additional usage of staff PPE, and extensive cleaning of exam room while observing appropriate contact time as indicated for disinfecting solutions.    This encounter was created in error - please disregard.

## 2019-08-06 NOTE — Patient Instructions (Signed)
  Blood work was ordered.     Medications reviewed and updated.  Changes include :     Your prescription(s) have been submitted to your pharmacy. Please take as directed and contact our office if you believe you are having problem(s) with the medication(s).  A referral was ordered for        Someone will call you to schedule this.    Please followup in 6 months

## 2019-08-08 ENCOUNTER — Encounter: Payer: 59 | Admitting: Internal Medicine

## 2019-08-09 ENCOUNTER — Other Ambulatory Visit: Payer: Self-pay

## 2019-08-09 ENCOUNTER — Telehealth: Payer: Self-pay | Admitting: Family Medicine

## 2019-08-09 MED ORDER — VILAZODONE HCL 20 MG PO TABS: 20.0000 mg | ORAL_TABLET | Freq: Every day | ORAL | 3 refills | Status: DC

## 2019-08-09 MED FILL — VIIBRYD 20 MG TABLET: 20 | 30 days supply | Qty: 30 | Fill #0

## 2019-08-09 NOTE — Telephone Encounter (Signed)
Sent patient message in [place].

## 2019-08-09 NOTE — Telephone Encounter (Signed)
Pt completed the starter pack of Viibyrd and would like a refill sent to [facility]. Please note, pt only has 3 left from the starter pack and was very worried about missing a dose.

## 2019-08-11 ENCOUNTER — Encounter: Payer: Self-pay | Admitting: Family Medicine

## 2019-08-14 ENCOUNTER — Ambulatory Visit: Payer: 59 | Admitting: Physical Therapy

## 2019-08-14 ENCOUNTER — Ambulatory Visit: Payer: 59 | Attending: Family Medicine

## 2019-08-14 ENCOUNTER — Other Ambulatory Visit: Payer: Self-pay

## 2019-08-14 DIAGNOSIS — G8929 Other chronic pain: Secondary | ICD-10-CM | POA: Insufficient documentation

## 2019-08-14 DIAGNOSIS — M533 Sacrococcygeal disorders, not elsewhere classified: Secondary | ICD-10-CM | POA: Diagnosis not present

## 2019-08-14 DIAGNOSIS — M6281 Muscle weakness (generalized): Secondary | ICD-10-CM | POA: Insufficient documentation

## 2019-08-14 DIAGNOSIS — M545 Low back pain, unspecified: Secondary | ICD-10-CM

## 2019-08-14 NOTE — Therapy (Signed)
[facility] [address]  [place], [place], [address] Phone: [phone]   Fax:  [phone]  Physical Therapy Treatment  Patient Details  Name: [name] MRN: [id number] Date of Birth: Jan 28, 1976 Referring Provider (PT): Lyndal Pulley, DO   Encounter Date: 08/14/2019  PT End of Session - 08/14/19 1004    Visit Number  11    Number of Visits  22    Date for PT Re-Evaluation  09/13/19    Authorization Type  [facility] - UMR    PT Start Time  0933    PT Stop Time  1014    PT Time Calculation (min)  41 min    Activity Tolerance  Patient tolerated treatment well    Behavior During Therapy  [facility] for tasks assessed/performed       Past Medical History:  Diagnosis Date  . Allergy   . Autoimmune disorder ([place])   . Crohn's disease ([place])   . Endometriosis   . Hypertension   . Kidney, medullary sponge   . Ovarian cyst   . Pre-eclampsia   . Raynaud's disease   . Stress incontinence in female     Past Surgical History:  Procedure Laterality Date  . CHOLECYSTECTOMY    . DILATION AND CURETTAGE OF UTERUS    . ENDOMETRIAL ABLATION    . NOVASURE ABLATION    . TUBAL LIGATION      There were no vitals filed for this visit.  Subjective Assessment - 08/14/19 0937    Subjective  Pt reports she started a new medication (SSRI) that "helps her cope". She had a steroid injection that helped some.    Diagnostic tests  08/11/15 - Lumbar MRI: 1. Slight progression of leftward disc protrusion at L5-S1 results in a mild left subarticular stenosis with probable contact of the traversing left S1 nerve root.  2. Slight progression of mild bilateral facet hypertrophy at L4-5 without significant stenosis.    Patient Stated Goals  "Increase core & R hip strength and decrease back pain"    Currently in Pain?  Yes    Pain Score  4     Pain Location  Back    Pain Orientation  Lower                        [facility]  Adult PT Treatment/Exercise - 08/14/19 0001      Exercises   Exercises  Lumbar      Lumbar Exercises: Stretches   Prone on Elbows Stretch  3 reps;30 seconds    Prone on Elbows Stretch Limitations  VCs for shoulder depression    Press Ups  10 reps    Press Ups Limitations  hands slightly forward of shoulders with manual lumbar PAs to facilitate pelvic alignment and lumbar extension      Lumbar Exercises: Supine   Bridge  10 reps;5 seconds    Bridge Limitations  manual ABD resistance    Bridge with Ball Squeeze  10 reps;5 seconds    Bridge with [employer] Limitations  corrected bridge positioning and applied force to knees to approximate femurs in hip joints      Manual Therapy   Manual Therapy  Joint mobilization;Muscle Energy Technique;Taping    Manual therapy comments  appearance of significant leg length discrepancy (R leg longer)    Joint Mobilization  L long axis distraction grade IV-V, grade IV lumbar PAs with partial press-ups  Muscle Energy Technique  L HS activation and R hip flexors to correct L anterior inonimate/upslip, shotgun technique    Kinesiotex  Facilitate Muscle;Ligament Correction      Kinesiotix   Ligament Correction  SIJ support 50% diagonally SIJ to PSIS B with black rock tape               PT Short Term Goals - 07/13/19 0809      PT SHORT TERM GOAL #1   Title  Patient will be independent with initial HEP    Status  Achieved   06/28/19     PT SHORT TERM GOAL #2   Title  Patient will verbalize/demonstrate understanding of neutral spine posture and proper body mechanics to reduce strain on lumbar spine    Status  Achieved   07/13/19       PT Long Term Goals - 08/02/19 0947      PT LONG TERM GOAL #1   Title  Patient will be independent with ongoing/advanced HEP    Status  Partially Met   08/02/19 - met for current HEP   Target Date  09/13/19      PT LONG TERM GOAL #2   Title  Patient to demonstrate appropriate posture and body mechanics  needed for daily activities    Status  Achieved   08/02/19     PT LONG TERM GOAL #3   Title  Patient to improve lumabr AROM to WNL/WFL without pain provocation    Status  Partially Met    Target Date  09/13/19      PT LONG TERM GOAL #4   Title  Patient will demonstrate improved B LE strength to >/= 4+/5 for improved stability and ease of mobility    Status  Partially Met    Target Date  09/13/19      PT LONG TERM GOAL #5   Title  Patient will complete household chores and tolerate 12 hr work shift with pain no greater than 3-4/10    Status  On-going   08/02/19 - pain still up to 6-7/10 with these activities   Target Date  09/13/19            Plan - 08/14/19 1005    Clinical Impression Statement  Pt presents with increasing pain and report of pelvis being out of alignment. Pt demonstrated L anterior inominate and upslip with significant leg length discrepancy. She responded well to L long axis distraction and MET for pelvis correction followed by sustained positions in hip ABD and ADD, low bridging with manual approximation, and prone extension positioning. Finished session with X- pattern black K-taping across sacrum to opposite PSIS bilateral. Pt extensively educated on positions and exercises to perform at work and throughout the day to maintain her alignment.    Personal Factors and Comorbidities  Comorbidity 3+;Past/Current Experience;Time since onset of injury/illness/exacerbation;Profession    Comorbidities  Acute on chronic LBP & SIJ dysfunction; polyarthralgia; h/o obturator internus muscle injury; HTN; hypothyroidism; pre-diabetes; autoimmune disorder; Raynaud's disease; Crohn's disease    Examination-Activity Limitations  Caring for Others;Lift;Sit;Locomotion Level    Examination-Participation Restrictions  Cleaning;Community Activity;Driving;Interpersonal Relationship;Laundry    Rehab Potential  Good    PT Frequency  2x / week    PT Duration  6 weeks   4-6 weeks   PT  Treatment/Interventions  ADLs/Self Care Home Management;Cryotherapy;Electrical Stimulation;Iontophoresis 32m/ml Dexamethasone;Moist Heat;Traction;Ultrasound;Functional mobility training;Therapeutic activities;Therapeutic exercise;Balance training;Neuromuscular re-education;Patient/family education;Manual techniques;Passive range of motion;Dry needling;Taping;Spinal Manipulations;Joint Manipulations    PT Next  Visit Plan  Lumbopelvic rhythm, strengthening and stability, manual therapy for SIJ correction as indicated, KT tape as continued benefit noted    PT Home Exercise Plan  06/26/19 - posterior pelvic tilt, seated HS and piriformis stretches; 5/10 - red TB side lying clam, dead bug, quadruped hip extension, red TB side stepping & B pallof press; 5/21 - side planking    Consulted and Agree with Plan of Care  Patient       Patient will benefit from skilled therapeutic intervention in order to improve the following deficits and impairments:  Decreased activity tolerance, Decreased knowledge of precautions, Decreased mobility, Decreased range of motion, Decreased strength, Difficulty walking, Hypomobility, Increased fascial restricitons, Increased muscle spasms, Impaired perceived functional ability, Impaired flexibility, Improper body mechanics, Postural dysfunction, Pain  Visit Diagnosis: Chronic bilateral low back pain without sciatica  Sacrococcygeal disorders, not elsewhere classified  Muscle weakness (generalized)     Problem List Patient Active Problem List   Diagnosis Date Noted  . Tremor 03/16/2019  . Prediabetes 02/09/2019  . Hypothyroidism (acquired) 08/02/2018  . Nonallopathic lesion of rib cage 10/08/2017  . Antalgic gait 04/19/2017  . Polyarthralgia 02/26/2016  . GERD (gastroesophageal reflux disease) 02/17/2016  . Cervical lymphadenopathy 01/21/2016  . Cough 09/14/2015  . Nonallopathic lesion of thoracic region 08/23/2015  . Lumbar radiculopathy 08/02/2015  . SI  (sacroiliac) joint dysfunction 03/20/2015  . Scapular dysfunction 03/20/2015  . 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 Nonallopathic lesion of lumbosacral region 06/29/2014  . Nonallopathic lesion of sacral region 06/29/2014  . Nonallopathic lesion of pelvic region 06/29/2014  . Recurrent UTI 05/18/2013  . Back pain, acute 10/07/2010  . Allergic rhinitis 08/14/2010  . Essential hypertension, benign 01/22/2010    Izell [place], PT, DPT 08/14/2019, 12:11 PM  [facility] [address]  [place], [place], [address] Phone: [phone]   Fax:  [phone]  Name: [name] MRN: [id number] Date of Birth: [birth year]

## 2019-08-17 ENCOUNTER — Other Ambulatory Visit: Payer: Self-pay

## 2019-08-17 ENCOUNTER — Ambulatory Visit: Payer: 59 | Admitting: Family Medicine

## 2019-08-17 ENCOUNTER — Encounter: Payer: Self-pay | Admitting: Family Medicine

## 2019-08-17 VITALS — BP 102/72 | HR 94 | Ht 63.0 in | Wt 144.0 lb

## 2019-08-17 DIAGNOSIS — M2550 Pain in unspecified joint: Secondary | ICD-10-CM

## 2019-08-17 DIAGNOSIS — M999 Biomechanical lesion, unspecified: Secondary | ICD-10-CM

## 2019-08-17 DIAGNOSIS — M5416 Radiculopathy, lumbar region: Secondary | ICD-10-CM

## 2019-08-17 LAB — SEDIMENTATION RATE: Sed Rate: 21 mm/hr — ABNORMAL HIGH (ref 0–20)

## 2019-08-17 NOTE — Patient Instructions (Signed)
Much better overall See me in 4-6 weeks

## 2019-08-17 NOTE — Assessment & Plan Note (Signed)
We discussed home exercise, icing regimen, which activities to do which wants to avoid.  Patient will increase activity slowly.  Discussed avoiding certain activities that has exacerbated it.  Discussed medication management including gabapentin as well as vilazodone.  Discussed the colchicine as well as a possibility that seem to help.

## 2019-08-17 NOTE — Assessment & Plan Note (Signed)
   Decision today to treat with OMT was based on Physical Exam  After verbal consent patient was treated with HVLA, ME, FPR techniques in  thoracic, rib, lumbar and sacral, pelvic areas, all areas are chronic   Patient tolerated the procedure well with improvement in symptoms  Patient given exercises, stretches and lifestyle modifications  See medications in patient instructions if given  Patient will follow up in 4-8 weeks

## 2019-08-17 NOTE — Progress Notes (Signed)
[facility] [place] Phone: [phone] Subjective:   [name], am serving as a scribe for Dr. Hulan Saas. This visit occurred during the SARS-CoV-2 public health emergency.  Safety protocols were in place, including screening questions prior to the visit, additional usage of staff PPE, and extensive cleaning of exam room while observing appropriate contact time as indicated for disinfecting solutions.   I'm seeing this patient by the request  of:  Binnie Rail, MD  CC: Back and neck pain follow-up  LFY:[id number]  [name] is a 44 y.o. female coming in with complaint of back and neck pain. OMT 08/04/2019. Patient states that she has pain in bilateral SI joints. Pain is intermittent. Occasionally has pain that radiates into hip and quad.  Patient states that it might be a little bit better.  Was just seen a couple weeks ago and feels like the Toradol and Depo-Medrol that was given did help significantly.  Medications patient has been prescribed: Patient is taking vilazodone and having good results Continues with anti-inflammatories intermittently but tries to avoid her stomach.         Reviewed prior external information including notes and imaging from previsou exam, outside providers and external EMR if available.   As well as notes that were available from care everywhere and other healthcare systems.  Past medical history, social, surgical and family history all reviewed in electronic medical record.  No pertanent information unless stated regarding to the chief complaint.   Past Medical History:  Diagnosis Date  . Allergy   . Autoimmune disorder ([place])   . Crohn's disease ([place])   . Endometriosis   . Hypertension   . Kidney, medullary sponge   . Ovarian cyst   . Pre-eclampsia   . Raynaud's disease   . Stress incontinence in female     Allergies  Allergen Reactions  . Lamisil  [Terbinafine] Hives and Itching    In pill form     Review of Systems:  No headache, visual changes, nausea, vomiting, diarrhea, constipation, dizziness, abdominal pain, skin rash, fevers, chills, night sweats, weight loss, swollen lymph nodes, body aches, joint swelling, chest pain, shortness of breath, mood changes. POSITIVE muscle aches  Objective  Blood pressure 102/72, pulse 94, height 5' 3"  (1.6 m), weight 144 lb (65.3 kg), SpO2 99 %.   General: No apparent distress alert and oriented x3 mood and affect normal, dressed appropriately.  HEENT: Pupils equal, extraocular movements intact  Respiratory: Patient's speak in full sentences and does not appear short of breath  Cardiovascular: No lower extremity edema, non tender, no erythema  Neuro: Cranial nerves II through XII are intact, neurovascularly intact in all extremities with 2+ DTRs and 2+ pulses.  Gait normal with good balance and coordination.  MSK: Pain still out of proportion to the amount of palpation.  Continues to have tightness in the lumbar spine with some mild improvement from previous exam.  Patient is negative straight leg test.  Neurovascularly intact distally.  Osteopathic findings  T3 extended rotated and side bent right inhaled rib T8 extended rotated and side bent left L2 flexed rotated and side bent right Sacrum right on right Pelvic shear right       Assessment and Plan:  Lumbar radiculopathy We discussed home exercise, icing regimen, which activities to do which wants to avoid.  Patient will increase activity slowly.  Discussed avoiding certain activities that has exacerbated it.  Discussed  medication management including gabapentin as well as vilazodone.  Discussed the colchicine as well as a possibility that seem to help.  Nonallopathic lesion of lumbosacral region   Decision today to treat with OMT was based on Physical Exam  After verbal consent patient was treated with HVLA, ME, FPR techniques  in  thoracic, rib, lumbar and sacral, pelvic areas, all areas are chronic   Patient tolerated the procedure well with improvement in symptoms  Patient given exercises, stretches and lifestyle modifications  See medications in patient instructions if given  Patient will follow up in 4-8 weeks         The above documentation has been reviewed and is accurate and complete Lyndal Pulley, DO       Note: This dictation was prepared with Dragon dictation along with smaller phrase technology. Any transcriptional errors that result from this process are unintentional.

## 2019-08-21 ENCOUNTER — Encounter: Payer: Self-pay | Admitting: Family Medicine

## 2019-08-28 ENCOUNTER — Encounter: Payer: Self-pay | Admitting: Physical Therapy

## 2019-08-28 ENCOUNTER — Other Ambulatory Visit: Payer: Self-pay

## 2019-08-28 ENCOUNTER — Ambulatory Visit: Payer: 59 | Admitting: Physical Therapy

## 2019-08-28 DIAGNOSIS — M6281 Muscle weakness (generalized): Secondary | ICD-10-CM | POA: Diagnosis not present

## 2019-08-28 DIAGNOSIS — M533 Sacrococcygeal disorders, not elsewhere classified: Secondary | ICD-10-CM | POA: Diagnosis not present

## 2019-08-28 DIAGNOSIS — M545 Low back pain: Secondary | ICD-10-CM | POA: Diagnosis not present

## 2019-08-28 DIAGNOSIS — G8929 Other chronic pain: Secondary | ICD-10-CM | POA: Diagnosis not present

## 2019-08-28 NOTE — Progress Notes (Signed)
Subjective:    Patient ID: [name], female    DOB: Nov 21, 1975, 44 y.o.   MRN: [id number]  HPI The patient is here for follow up of their chronic medical problems, including hypertension, prediabetes, polyarthralgia, recurrent UTI  She is taking all of her medications as prescribed.    She is exercising regularly.   She walks some.   She continues to struggle with fatigue, joint pain and muscle pain.  She has to nap on her days off.  All of her muscles and joints hurt.    She still has a lot of joint pain and inflammatory symptoms.  The steroids have helped a little.    Medications and allergies reviewed with patient and updated if appropriate.  Patient Active Problem List   Diagnosis Date Noted  . Myalgia 08/29/2019  . Tremor 03/16/2019  . Prediabetes 02/09/2019  . Hypothyroidism (acquired) 08/02/2018  . Nonallopathic lesion of rib cage 10/08/2017  . Antalgic gait 04/19/2017  . Polyarthralgia 02/26/2016  . GERD (gastroesophageal reflux disease) 02/17/2016  . Cervical lymphadenopathy 01/21/2016  . Cough 09/14/2015  . Nonallopathic lesion of thoracic region 08/23/2015  . Lumbar radiculopathy 08/02/2015  . 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 Scapular dysfunction 03/20/2015  . 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 Nonallopathic lesion of lumbosacral region 06/29/2014  . Nonallopathic lesion of sacral region 06/29/2014  . Nonallopathic lesion of pelvic region 06/29/2014  . Recurrent UTI 05/18/2013  . Back pain, acute 10/07/2010  . Allergic rhinitis 08/14/2010  . Essential hypertension, benign 01/22/2010    Current Outpatient Medications on File Prior to Visit  Medication Sig Dispense Refill  . acetaminophen (TYLENOL) 500 MG tablet Take 1,000 mg by mouth every 6 (six) hours as needed for mild pain or headache.    . Acetaminophen-Caffeine (EXCEDRIN TENSION HEADACHE) 500-65 MG TABS Take 2 tablets  by mouth daily as needed (headache).    Marland Kitchen amLODipine (NORVASC) 5 MG tablet Take 1 tablet (5 mg total) by mouth daily. 90 tablet 1  . Cholecalciferol (VITAMIN D3) 2000 units TABS Take 2,000 Units by mouth daily.    . colchicine 0.6 MG tablet Take 1 tablet (0.6 mg total) by mouth 2 (two) times daily. 10 tablet 0  . docusate sodium (COLACE) 100 MG capsule Take 100 mg by mouth daily.    . fluticasone (FLONASE) 50 MCG/ACT nasal spray Place 2 sprays into both nostrils daily. 16 g 6  . gabapentin (NEURONTIN) 100 MG capsule TAKE 2 CAPSULES BY MOUTH AT BEDTIME. (Patient taking differently: Take 100 mg by mouth at bedtime as needed (nerve pain). ) 180 capsule 1  . levothyroxine (SYNTHROID) 50 MCG tablet TAKE 1 TABLET (50 MCG TOTAL) BY MOUTH DAILY. (Patient taking differently: Take 50 mcg by mouth daily before breakfast. ) 90 tablet 0  . loratadine (CLARITIN) 10 MG tablet Take 10 mg by mouth daily.    . multivitamin-lutein (OCUVITE-LUTEIN) CAPS capsule Take 1 capsule by mouth daily.    . nitrofurantoin (MACRODANTIN) 50 MG capsule TAKE AFTER INTERCOURSE (Patient taking differently: Take 50 mg by mouth daily as needed (UTI prophylaxis). TAKE AFTER INTERCOURSE) 45 capsule 0  . omeprazole (PRILOSEC) 20 MG capsule Take 20 mg by mouth daily.    . ondansetron (ZOFRAN ODT) 4 MG disintegrating tablet Take 1 tablet (4 mg total) by mouth every 8 (eight) hours as needed for nausea or vomiting. 20 tablet 0  . Prasterone, DHEA, (DHEA ADVANCED FORMULA PO)  Take 1 tablet by mouth daily.     Marland Kitchen Propylene Glycol (SYSTANE COMPLETE OP) Place 1 drop into both eyes daily.    . traMADol (ULTRAM) 50 MG tablet Take 1 tablet (50 mg total) by mouth every 12 (twelve) hours as needed for moderate pain. 30 tablet 0  . Vilazodone HCl 20 MG TABS Take 1 tablet (20 mg total) by mouth daily. 30 tablet 3   No current facility-administered medications on file prior to visit.    Past Medical History:  Diagnosis Date  . Allergy   . Autoimmune  disorder ([place])   . Crohn's disease ([place])   . Endometriosis   . Hypertension   . Kidney, medullary sponge   . Ovarian cyst   . Pre-eclampsia   . Raynaud's disease   . Stress incontinence in female     Past Surgical History:  Procedure Laterality Date  . CHOLECYSTECTOMY    . DILATION AND CURETTAGE OF UTERUS    . ENDOMETRIAL ABLATION    . NOVASURE ABLATION    . TUBAL LIGATION      Social History   Socioeconomic History  . Marital status: Married    Spouse name: Not on file  . Number of children: 3  . Years of education: Not on file  . Highest education level: Not on file  Occupational History  . Occupation: Programmer, multimedia: [employer]  Tobacco Use  . Smoking status: Never Smoker  . Smokeless tobacco: Never Used  Vaping Use  . Vaping Use: Never used  Substance and Sexual Activity  . Alcohol use: Yes  . Drug use: No  . Sexual activity: Yes  Other Topics Concern  . 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 Difficulty of Paying Living Expenses:   Food Insecurity:   . Worried About Charity fundraiser in the Last Year:   . Arboriculturist in the Last Year:   Transportation Needs:   . Film/video editor (Medical):   Marland Kitchen Lack of Transportation (Non-Medical):   Physical Activity:   . Days of Exercise per Week:   . Minutes of Exercise per Session:   Stress:   . Feeling of Stress :   Social Connections:   . Frequency of Communication with Friends and Family:   . Frequency of Social Gatherings with Friends and Family:   . Attends Religious Services:   . Active Member of Clubs or Organizations:   . Attends Archivist Meetings:   Marland Kitchen Marital Status:     Family History  Problem Relation Age of Onset  . Breast cancer Mother   . Hypertension Father   . Hyperlipidemia Father   . Other Brother        lyme-chronic  . Stroke Maternal Grandmother    . Alcohol abuse Paternal Grandmother   . Heart disease Paternal Grandfather   . Colon cancer Neg Hx   . Esophageal cancer Neg Hx   . Rectal cancer Neg Hx     Review of Systems  Constitutional: Positive for fatigue. Negative for chills and fever.  Respiratory: Negative for cough, shortness of breath and wheezing.   Cardiovascular: Negative for chest pain, palpitations and leg swelling.  Musculoskeletal: Positive for arthralgias, back pain and myalgias. Negative for joint swelling.  Skin: Negative for rash.  Neurological: Positive for headaches (  occ, some migraines).       Objective:   Vitals:   08/29/19 1406  BP: 126/80  Pulse: (!) 101  Temp: 98.2 F (36.8 C)  SpO2: 98%   BP Readings from Last 3 Encounters:  08/29/19 126/80  08/17/19 102/72  08/04/19 110/80   Wt Readings from Last 3 Encounters:  08/29/19 144 lb (65.3 kg)  08/17/19 144 lb (65.3 kg)  08/04/19 146 lb (66.2 kg)   Body mass index is 25.51 kg/m.   Physical Exam    Constitutional: Appears well-developed and well-nourished. No distress.  HENT:  Head: Normocephalic and atraumatic.  Neck: Neck supple. No tracheal deviation present. No thyromegaly present.  No cervical lymphadenopathy Cardiovascular: Normal rate, regular rhythm and normal heart sounds.   No murmur heard. No carotid bruit .  No edema Pulmonary/Chest: Effort normal and breath sounds normal. No respiratory distress. No has no wheezes. No rales. Musculoskeletal: No obvious joint swelling.  Muscle tenderness with palpation of multiple areas Skin: Skin is warm and dry. Not diaphoretic.  Psychiatric: Normal mood and affect. Behavior is normal.      Assessment & Plan:    See Problem List for Assessment and Plan of chronic medical problems.    This visit occurred during the SARS-CoV-2 public health emergency.  Safety protocols were in place, including screening questions prior to the visit, additional usage of staff PPE, and extensive  cleaning of exam room while observing appropriate contact time as indicated for disinfecting solutions.

## 2019-08-28 NOTE — Patient Instructions (Addendum)
  Blood work was ordered.     Medications reviewed and updated.  Changes include :  none    A referral was ordered for [facility].     Someone will call you to schedule this.    Please followup in 6 months

## 2019-08-28 NOTE — Therapy (Addendum)
[facility] [address]  [place], [place], [address] Phone: [phone]   Fax:  [phone]  Physical Therapy Treatment / Discharge Summary  Patient Details  Name: [name] MRN: [id number] Date of Birth: 02-11-1976 Referring Provider (PT): Lyndal Pulley, DO   Encounter Date: 08/28/2019   PT End of Session - 08/28/19 1027    Visit Number 12    Number of Visits 22    Date for PT Re-Evaluation 09/13/19    Authorization Type [facility] - [facility]    PT Start Time 1027    PT Stop Time 1108    PT Time Calculation (min) 41 min    Activity Tolerance Patient tolerated treatment well    Behavior During Therapy [facility] for tasks assessed/performed           Past Medical History:  Diagnosis Date  . Allergy   . Autoimmune disorder ([place])   . Crohn's disease ([place])   . Endometriosis   . Hypertension   . Kidney, medullary sponge   . Ovarian cyst   . Pre-eclampsia   . Raynaud's disease   . Stress incontinence in female     Past Surgical History:  Procedure Laterality Date  . CHOLECYSTECTOMY    . DILATION AND CURETTAGE OF UTERUS    . ENDOMETRIAL ABLATION    . NOVASURE ABLATION    . TUBAL LIGATION      There were no vitals filed for this visit.   Subjective Assessment - 08/28/19 1032    Subjective Pt feels like her inflammation is hindering her progress and maybe she needs to take a break to get the inflammation under control before continuing with PT.    Diagnostic tests 08/11/15 - Lumbar MRI: 1. Slight progression of leftward disc protrusion at L5-S1 results in a mild left subarticular stenosis with probable contact of the traversing left S1 nerve root.  2. Slight progression of mild bilateral facet hypertrophy at L4-5 without significant stenosis.    Patient Stated Goals "Increase core & R hip strength and decrease back pain"    Currently in Pain? Yes    Pain Score 3     Pain Location Buttocks    Pain  Orientation Right;Upper    Pain Descriptors / Indicators Constant;Burning    Pain Type Acute pain;Chronic pain    Pain Frequency Constant                             [facility] Adult PT Treatment/Exercise - 08/28/19 1027      Lumbar Exercises: Aerobic   Tread Mill 2.8-3.2 mph x 6 min      Lumbar Exercises: Supine   Dead Bug 15 reps;3 seconds    Dead Bug Limitations UE/LE from tabletop position    Bridge with clamshell 15 reps    Bridge with [employer] Limitations sustained bridge + B green TB clam 3 x 5 reps      Lumbar Exercises: Sidelying   Clam Right;Left;10 reps;3 seconds    Clam Limitations green TB      Manual Therapy   Manual Therapy Joint mobilization;Muscle Energy Technique;Taping    Manual therapy comments appearance of mild leg length discrepancy (R leg longer)    Joint Mobilization L long axis distraction grade IV-V,    Muscle Energy Technique shotgun technique    Kinesiotex Ligament Correction      Kinesiotix   Ligament Correction  SIJ support - 30-50% horizontally across B PSIS + 50% stars over B SIJ   camo RockTape                   PT Short Term Goals - 07/13/19 0809      PT SHORT TERM GOAL #1   Title Patient will be independent with initial HEP    Status Achieved   06/28/19     PT SHORT TERM GOAL #2   Title Patient will verbalize/demonstrate understanding of neutral spine posture and proper body mechanics to reduce strain on lumbar spine    Status Achieved   07/13/19            PT Long Term Goals - 08/02/19 0947      PT LONG TERM GOAL #1   Title Patient will be independent with ongoing/advanced HEP    Status Partially Met   08/02/19 - met for current HEP   Target Date 09/13/19      PT LONG TERM GOAL #2   Title Patient to demonstrate appropriate posture and body mechanics needed for daily activities    Status Achieved   08/02/19     PT LONG TERM GOAL #3   Title Patient to improve lumabr AROM to WNL/WFL without pain  provocation    Status Partially Met    Target Date 09/13/19      PT LONG TERM GOAL #4   Title Patient will demonstrate improved B LE strength to >/= 4+/5 for improved stability and ease of mobility    Status Partially Met    Target Date 09/13/19      PT LONG TERM GOAL #5   Title Patient will complete household chores and tolerate 12 hr work shift with pain no greater than 3-4/10    Status On-going   08/02/19 - pain still up to 6-7/10 with these activities   Target Date 09/13/19                 Plan - 08/28/19 1108    Clinical Impression Statement [name] expressing frustration with ongoing inflammation from her autoimmune disease which she feels is hampering her progress with PT. She has expressed a desire to take a break from PT for a time while she attempts to meet with a specialist to address and hopefully better control the inflammatory process. Thus, session focusing on education for review of ongoing HEP and self-management techniques such as kinesiotaping until she is able to return to PT. [name] acknowledging good understanding of reviewed exercises and denies any questions. Will place her on hold for 30-day while she looks into options to better manage her autoimmune inflammation.    Personal Factors and Comorbidities Comorbidity 3+;Past/Current Experience;Time since onset of injury/illness/exacerbation;Profession    Comorbidities Acute on chronic LBP & SIJ dysfunction; polyarthralgia; h/o obturator internus muscle injury; HTN; hypothyroidism; pre-diabetes; autoimmune disorder; Raynaud's disease; Crohn's disease    Examination-Activity Limitations Caring for Others;Lift;Sit;Locomotion Level    Examination-Participation Restrictions Cleaning;Community Activity;Driving;Interpersonal Relationship;Laundry    Rehab Potential Good    PT Frequency 2x / week    PT Duration 6 weeks   4-6 weeks   PT Treatment/Interventions ADLs/Self Care Home Management;Cryotherapy;Electrical  Stimulation;Iontophoresis 96m/ml Dexamethasone;Moist Heat;Traction;Ultrasound;Functional mobility training;Therapeutic activities;Therapeutic exercise;Balance training;Neuromuscular re-education;Patient/family education;Manual techniques;Passive range of motion;Dry needling;Taping;Spinal Manipulations;Joint Manipulations    PT Next Visit Plan 30-day hold; reassessment and resumption as indicated of lumbopelvic rhythm training, strengthening and stability, manual therapy for SIJ correction as indicated, KT tape as continued benefit noted  PT Home Exercise Plan 06/26/19 - posterior pelvic tilt, seated HS and piriformis stretches; 5/10 - red TB side lying clam, dead bug, quadruped hip extension, red TB side stepping & B pallof press; 5/21 - side planking    Consulted and Agree with Plan of Care Patient           Patient will benefit from skilled therapeutic intervention in order to improve the following deficits and impairments:  Decreased activity tolerance, Decreased knowledge of precautions, Decreased mobility, Decreased range of motion, Decreased strength, Difficulty walking, Hypomobility, Increased fascial restricitons, Increased muscle spasms, Impaired perceived functional ability, Impaired flexibility, Improper body mechanics, Postural dysfunction, Pain  Visit Diagnosis: Chronic bilateral low back pain without sciatica  Sacrococcygeal disorders, not elsewhere classified  Muscle weakness (generalized)     Problem List Patient Active Problem List   Diagnosis Date Noted  . Tremor 03/16/2019  . Prediabetes 02/09/2019  . Hypothyroidism (acquired) 08/02/2018  . Nonallopathic lesion of rib cage 10/08/2017  . Antalgic gait 04/19/2017  . Polyarthralgia 02/26/2016  . GERD (gastroesophageal reflux disease) 02/17/2016  . Cervical lymphadenopathy 01/21/2016  . Cough 09/14/2015  . Nonallopathic lesion of thoracic region 08/23/2015  . Lumbar radiculopathy 08/02/2015  . SI (sacroiliac) joint  dysfunction 03/20/2015  . Scapular dysfunction 03/20/2015  . 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 Nonallopathic lesion of lumbosacral region 06/29/2014  . Nonallopathic lesion of sacral region 06/29/2014  . Nonallopathic lesion of pelvic region 06/29/2014  . Recurrent UTI 05/18/2013  . Back pain, acute 10/07/2010  . Allergic rhinitis 08/14/2010  . Essential hypertension, benign 01/22/2010    Percival Spanish, PT, MPT 08/28/2019, 1:40 PM  [facility] [address]  [place], [place], [address] Phone: [phone]   Fax:  [phone]  Name: [name] MRN: [id number] Date of Birth: [birth year]   PHYSICAL THERAPY DISCHARGE SUMMARY  Visits from Start of Care: 12  Current functional level related to goals / functional outcomes:   Refer to above clinical impression for status as of last visit on 08/28/2019. Patient was placed on hold for 30 days and has not returned to PT, therefore will proceed with discharge from PT for this episode.   Remaining deficits:   As above.   Education / Equipment:   HEP  Plan: Patient agrees to discharge.  Patient goals were partially met. Patient is being discharged due to not returning since the last visit.  ?????     Percival Spanish, PT, MPT 10/17/19, 9:21 AM  [facility] [address]  [place], [place], [address] Phone: [phone]   Fax:  [phone]

## 2019-08-29 ENCOUNTER — Other Ambulatory Visit (INDEPENDENT_AMBULATORY_CARE_PROVIDER_SITE_OTHER): Payer: 59

## 2019-08-29 ENCOUNTER — Ambulatory Visit: Payer: 59 | Admitting: Internal Medicine

## 2019-08-29 ENCOUNTER — Encounter: Payer: Self-pay | Admitting: Internal Medicine

## 2019-08-29 VITALS — BP 126/80 | HR 101 | Temp 98.2°F | Ht 63.0 in | Wt 144.0 lb

## 2019-08-29 DIAGNOSIS — I10 Essential (primary) hypertension: Secondary | ICD-10-CM

## 2019-08-29 DIAGNOSIS — N390 Urinary tract infection, site not specified: Secondary | ICD-10-CM | POA: Diagnosis not present

## 2019-08-29 DIAGNOSIS — R7303 Prediabetes: Secondary | ICD-10-CM

## 2019-08-29 DIAGNOSIS — M791 Myalgia, unspecified site: Secondary | ICD-10-CM

## 2019-08-29 DIAGNOSIS — E039 Hypothyroidism, unspecified: Secondary | ICD-10-CM

## 2019-08-29 DIAGNOSIS — M2550 Pain in unspecified joint: Secondary | ICD-10-CM

## 2019-08-29 LAB — CBC WITH DIFFERENTIAL/PLATELET
Basophils Absolute: 0.2 10*3/uL — ABNORMAL HIGH (ref 0.0–0.1)
Basophils Relative: 1.2 % (ref 0.0–3.0)
Eosinophils Absolute: 0.4 10*3/uL (ref 0.0–0.7)
Eosinophils Relative: 2.5 % (ref 0.0–5.0)
HCT: 43.6 % (ref 36.0–46.0)
Hemoglobin: 14.6 g/dL (ref 12.0–15.0)
Lymphocytes Relative: 30.8 % (ref 12.0–46.0)
Lymphs Abs: 4.4 10*3/uL — ABNORMAL HIGH (ref 0.7–4.0)
MCHC: 33.5 g/dL (ref 30.0–36.0)
MCV: 92.6 fl (ref 78.0–100.0)
Monocytes Absolute: 0.8 10*3/uL (ref 0.1–1.0)
Monocytes Relative: 5.5 % (ref 3.0–12.0)
Neutro Abs: 8.6 10*3/uL — ABNORMAL HIGH (ref 1.4–7.7)
Neutrophils Relative %: 60 % (ref 43.0–77.0)
Platelets: 304 10*3/uL (ref 150.0–400.0)
RBC: 4.71 Mil/uL (ref 3.87–5.11)
RDW: 13 % (ref 11.5–15.5)
WBC: 14.3 10*3/uL — ABNORMAL HIGH (ref 4.0–10.5)

## 2019-08-29 LAB — COMPREHENSIVE METABOLIC PANEL
ALT: 17 U/L (ref 0–35)
AST: 18 U/L (ref 0–37)
Albumin: 4.8 g/dL (ref 3.5–5.2)
Alkaline Phosphatase: 102 U/L (ref 39–117)
BUN: 19 mg/dL (ref 6–23)
CO2: 30 mEq/L (ref 19–32)
Calcium: 10 mg/dL (ref 8.4–10.5)
Chloride: 98 mEq/L (ref 96–112)
Creatinine, Ser: 0.83 mg/dL (ref 0.40–1.20)
GFR: 73.93 mL/min (ref >60.00)
Glucose, Bld: 117 mg/dL — ABNORMAL HIGH (ref 70–99)
Potassium: 3.5 mEq/L (ref 3.5–5.1)
Sodium: 135 mEq/L (ref 135–145)
Total Bilirubin: 0.4 mg/dL (ref 0.2–1.2)
Total Protein: 7.5 g/dL (ref 6.0–8.3)

## 2019-08-29 LAB — CK: Total CK: 190 U/L — ABNORMAL HIGH (ref 7–177)

## 2019-08-29 LAB — C-REACTIVE PROTEIN: CRP: <1 mg/dL (ref 0.5–20.0)

## 2019-08-29 LAB — TSH: TSH: 2.6 u[IU]/mL (ref 0.35–4.50)

## 2019-08-29 LAB — SEDIMENTATION RATE: Sed Rate: 17 mm/hr (ref 0–20)

## 2019-08-29 LAB — HEMOGLOBIN A1C: Hgb A1c MFr Bld: 5.5 % (ref 4.6–6.5)

## 2019-08-29 NOTE — Assessment & Plan Note (Addendum)
Chronic Diffuse Esr, ana, crp, ck Referred to [facility] Possible fibromyalgia Concern for possible Lyme-her brother does have that ?  Autoimmune Rule out myositis

## 2019-08-29 NOTE — Assessment & Plan Note (Signed)
Chronic Uses macrobid prn No recent UTI's Continue above

## 2019-08-29 NOTE — Assessment & Plan Note (Signed)
Chronic Check a1c Low sugar / carb diet Stressed regular exercise  

## 2019-08-29 NOTE — Assessment & Plan Note (Signed)
Chronic fatigue but clinically euthyroid Currently taking thyroid medication daily Will check TSH

## 2019-08-29 NOTE — Assessment & Plan Note (Signed)
Chronic BP well controlled Current regimen effective and well tolerated Continue current medications at current doses cmp

## 2019-08-29 NOTE — Assessment & Plan Note (Addendum)
Chronic Diffuse joint and muscle pain Following with sports medicine Has tried several treatments w/o much improvement Symptoms appear more autoimmune in nature - referred to [facility] for further evaluation Tramadol prn Taking gabapentin, vilazodone Ck cbc, cmp, ana, esr, crp

## 2019-08-30 LAB — ANA: Anti Nuclear Antibody (ANA): NEGATIVE

## 2019-08-31 ENCOUNTER — Ambulatory Visit: Payer: 59 | Admitting: Physical Therapy

## 2019-09-01 ENCOUNTER — Encounter: Payer: Self-pay | Admitting: Family Medicine

## 2019-09-01 ENCOUNTER — Encounter: Payer: Self-pay | Admitting: Internal Medicine

## 2019-09-01 ENCOUNTER — Other Ambulatory Visit: Payer: Self-pay | Admitting: Obstetrics & Gynecology

## 2019-09-01 ENCOUNTER — Other Ambulatory Visit: Payer: Self-pay | Admitting: Family Medicine

## 2019-09-01 ENCOUNTER — Other Ambulatory Visit: Payer: Self-pay | Admitting: Internal Medicine

## 2019-09-01 DIAGNOSIS — Z1231 Encounter for screening mammogram for malignant neoplasm of breast: Secondary | ICD-10-CM

## 2019-09-04 ENCOUNTER — Ambulatory Visit: Payer: 59 | Admitting: Physical Therapy

## 2019-09-04 MED FILL — LEVOTHYROXINE 50 MCG TABLET: 50 | 90 days supply | Qty: 90 | Fill #0

## 2019-09-04 MED FILL — traMADol HCL 50 MG TABS: 50 | 15 days supply | Qty: 30 | Fill #0

## 2019-09-05 ENCOUNTER — Other Ambulatory Visit: Payer: Self-pay | Admitting: Internal Medicine

## 2019-09-05 MED FILL — AMLODIPINE BESYLATE 5 MG TA: 5 | 90 days supply | Qty: 90 | Fill #0

## 2019-09-05 MED FILL — VIIBRYD 20 MG TABLET: 20 | 30 days supply | Qty: 30 | Fill #1

## 2019-09-07 ENCOUNTER — Ambulatory Visit: Payer: 59 | Admitting: Physical Therapy

## 2019-09-13 ENCOUNTER — Ambulatory Visit: Payer: 59 | Admitting: Physical Therapy

## 2019-09-26 ENCOUNTER — Other Ambulatory Visit: Payer: Self-pay

## 2019-09-26 ENCOUNTER — Ambulatory Visit
Admission: RE | Admit: 2019-09-26 | Discharge: 2019-09-26 | Disposition: A | Discharge status: Home / Self Care | Payer: 59 | Source: Ambulatory Visit | Attending: Obstetrics & Gynecology | Admitting: Obstetrics & Gynecology

## 2019-09-26 DIAGNOSIS — Z1231 Encounter for screening mammogram for malignant neoplasm of breast: Secondary | ICD-10-CM | POA: Diagnosis not present

## 2019-09-27 ENCOUNTER — Ambulatory Visit: Payer: 59

## 2019-10-02 ENCOUNTER — Other Ambulatory Visit: Payer: Self-pay | Admitting: Family Medicine

## 2019-10-02 ENCOUNTER — Encounter: Payer: Self-pay | Admitting: Family Medicine

## 2019-10-02 ENCOUNTER — Ambulatory Visit: Payer: 59 | Admitting: Family Medicine

## 2019-10-02 ENCOUNTER — Other Ambulatory Visit: Payer: Self-pay

## 2019-10-02 VITALS — BP 118/84 | HR 78 | Ht 63.0 in | Wt 145.0 lb

## 2019-10-02 DIAGNOSIS — M999 Biomechanical lesion, unspecified: Secondary | ICD-10-CM

## 2019-10-02 DIAGNOSIS — M79672 Pain in left foot: Secondary | ICD-10-CM | POA: Diagnosis not present

## 2019-10-02 DIAGNOSIS — M79671 Pain in right foot: Secondary | ICD-10-CM | POA: Diagnosis not present

## 2019-10-02 DIAGNOSIS — M2550 Pain in unspecified joint: Secondary | ICD-10-CM

## 2019-10-02 MED ORDER — PREDNISONE 20 MG PO TABS
20.0000 mg | ORAL_TABLET | Freq: Two times a day (BID) | ORAL | 0 refills | Status: DC
Start: 2019-10-02 — End: 2020-01-10

## 2019-10-02 MED ORDER — MONTELUKAST SODIUM 10 MG PO TABS
10.0000 mg | ORAL_TABLET | Freq: Every day | ORAL | 1 refills | Status: DC
Start: 2019-10-02 — End: 2019-10-02

## 2019-10-02 MED FILL — MONTELUKAST SOD 10 MG TAB: 10 | 90 days supply | Qty: 90 | Fill #0

## 2019-10-02 MED FILL — predniSONE 20 MG TABS: 20 | 7 days supply | Qty: 14 | Fill #0

## 2019-10-02 NOTE — Progress Notes (Signed)
[facility] [address] [place] Phone: [phone] Subjective:   I [name] am serving as a Education administrator for Dr. Hulan Saas.  This visit occurred during the SARS-CoV-2 public health emergency.  Safety protocols were in place, including screening questions prior to the visit, additional usage of staff PPE, and extensive cleaning of exam room while observing appropriate contact time as indicated for disinfecting solutions.   I'm seeing this patient by the request  of:  Burns, Claudina Lick, MD  CC: Neck and back pain follow-up  [id number]  Becka Kema Santaella is a 44 y.o. female coming in with complaint of back and neck pain. OMT 08/17/2019. Patient states she is not feeling well today.  Patient states that the pain seems to be worsening over the course of time.  States that is giving her pain all the time.  Denies any type of radiation but states that she most feels like her skin is more irritated than usual.  Feels like she is fatigued all the time.  Has test changing jobs secondary to her not being able to be as physically active at the moment.         Reviewed prior external information including notes and imaging from previsou exam, outside providers and external EMR if available.   As well as notes that were available from care everywhere and other healthcare systems.  Past medical history, social, surgical and family history all reviewed in electronic medical record.  No pertanent information unless stated regarding to the chief complaint.   Past Medical History:  Diagnosis Date  . Allergy   . Autoimmune disorder ([place])   . Crohn's disease ([place])   . Endometriosis   . Hypertension   . Kidney, medullary sponge   . Ovarian cyst   . Pre-eclampsia   . Raynaud's disease   . Stress incontinence in female     Allergies  Allergen Reactions  . Lamisil [Terbinafine] Hives and Itching    In pill form     Review of Systems:   No headache, visual changes, nausea, vomiting, diarrhea, constipation, dizziness, abdominal pain, skin rash, fevers, chills, night sweats, weight loss, swollen lymph nodes, , joint swelling, chest pain, shortness of breath, mood changes. POSITIVE muscle aches, body aches  Objective  Blood pressure 118/84, pulse 78, height 5' 3"  (1.6 m), weight 145 lb (65.8 kg), SpO2 97 %.   General: No apparent distress alert and oriented x3 mood and affect normal, dressed appropriately.  HEENT: Pupils equal, extraocular movements intact  Respiratory: Patient's speak in full sentences and does not appear short of breath  Cardiovascular: No lower extremity edema, non tender, no erythema  Neuro: Cranial nerves II through XII are intact, neurovascularly intact in all extremities with 2+ DTRs and 2+ pulses.  Gait normal with good balance and coordination.  MSK:  Non tender with full range of motion and good stability and symmetric strength and tone of shoulders, elbows, wrist, hip, knee and ankles bilaterally.  Back - Normal skin, Spine with normal alignment and no deformity.  No tenderness to vertebral process palpation.  Paraspinous muscles are not tender and without spasm.   Range of motion is full at neck and lumbar sacral regions Foot exam shows the patient does have mild pes planus.  Overpronation of the midfoot.  Patient does have tenderness diffusely.  Full range of motion of the ankle noted.   Osteopathic findings  C2 flexed rotated and side bent right T3  extended rotated and side bent right inhaled rib T8 extended rotated and side bent left L2 flexed rotated and side bent right Sacrum right on right       Assessment and Plan:  Polyarthralgia Continues to have polyarthralgia.  Patient is going to be seeing another rheumatologist.  Has had a positive ANA but has not been seen previously and told not better was rheumatology.  Fibromyalgia and chronic pain syndrome is within the differential.   Discussed with patient in great length about different medications.  No significant changes at this point and encouraged her to continue with the gabapentin, Synthroid, and the vilazodone.     Nonallopathic problems  Decision today to treat with OMT was based on Physical Exam  After verbal consent patient was treated with HVLA, ME, FPR techniques in cervical, rib, thoracic, lumbar, and sacral  areas  Patient tolerated the procedure well with improvement in symptoms  Patient given exercises, stretches and lifestyle modifications  See medications in patient instructions if given  Patient will follow up in 4-8 weeks      The above documentation has been reviewed and is accurate and complete Lyndal Pulley, DO       Note: This dictation was prepared with Dragon dictation along with smaller phrase technology. Any transcriptional errors that result from this process are unintentional.

## 2019-10-02 NOTE — Patient Instructions (Addendum)
Prednisone 20 14 singulair 10 mg at night  See me again in 5-6 weeks

## 2019-10-02 NOTE — Assessment & Plan Note (Signed)
Continues to have polyarthralgia.  Patient is going to be seeing another rheumatologist.  Has had a positive ANA but has not been seen previously and told not better was rheumatology.  Fibromyalgia and chronic pain syndrome is within the differential.  Discussed with patient in great length about different medications.  No significant changes at this point and encouraged her to continue with the gabapentin, Synthroid, and the vilazodone.

## 2019-10-09 MED FILL — GABAPENTIN 100 MG CAPSULE: 100 | 90 days supply | Qty: 180 | Fill #1

## 2019-10-09 MED FILL — VIIBRYD 20 MG TABLET: 20 | 30 days supply | Qty: 30 | Fill #2

## 2019-10-24 ENCOUNTER — Other Ambulatory Visit: Payer: Self-pay

## 2019-10-24 ENCOUNTER — Ambulatory Visit: Payer: 59 | Admitting: Physical Therapy

## 2019-10-24 DIAGNOSIS — M25571 Pain in right ankle and joints of right foot: Secondary | ICD-10-CM | POA: Diagnosis not present

## 2019-10-24 DIAGNOSIS — M25572 Pain in left ankle and joints of left foot: Secondary | ICD-10-CM | POA: Diagnosis not present

## 2019-10-24 DIAGNOSIS — M545 Low back pain: Secondary | ICD-10-CM | POA: Diagnosis not present

## 2019-11-02 DIAGNOSIS — M545 Low back pain: Secondary | ICD-10-CM | POA: Diagnosis not present

## 2019-11-06 MED FILL — VIIBRYD 20 MG TABLET: 20 | 30 days supply | Qty: 30 | Fill #3

## 2019-11-09 ENCOUNTER — Ambulatory Visit: Payer: 59 | Admitting: Family Medicine

## 2019-11-14 DIAGNOSIS — M545 Low back pain: Secondary | ICD-10-CM | POA: Diagnosis not present

## 2019-11-15 ENCOUNTER — Encounter: Payer: Self-pay | Admitting: Physical Therapy

## 2019-11-15 NOTE — Therapy (Signed)
[facility] [address] [place], [place], [address] Phone: [phone]   Fax:  [phone]  Physical Therapy Evaluation  Patient Details  Name: [name] MRN: [id number] Date of Birth: August 12, 1975 Referring Provider (PT): [name]   Encounter Date: 10/24/2019   PT End of Session - 11/15/19 2106    Visit Number 1    Number of Visits 4    Date for PT Re-Evaluation 11/14/19    Authorization Type [facility] - [facility]    PT Start Time 1610    PT Stop Time 1650    PT Time Calculation (min) 40 min    Activity Tolerance Patient tolerated treatment well    Behavior During Therapy [facility] for tasks assessed/performed           Past Medical History:  Diagnosis Date  . Allergy   . Autoimmune disorder ([place])   . Crohn's disease ([place])   . Endometriosis   . Hypertension   . Kidney, medullary sponge   . Ovarian cyst   . Pre-eclampsia   . Raynaud's disease   . Stress incontinence in female     Past Surgical History:  Procedure Laterality Date  . CHOLECYSTECTOMY    . DILATION AND CURETTAGE OF UTERUS    . ENDOMETRIAL ABLATION    . NOVASURE ABLATION    . TUBAL LIGATION      There were no vitals filed for this visit.    Subjective Assessment - 11/15/19 2048    Subjective Pt reports ongoing pain in back and feet. Works as Marine scientist, stands long hours. She states she is already going to PT elsewhere for back/ LE pain, and would like to continue going to that clinic. Pt is interested in getting orthotics today, for foot pain.    Limitations Standing;Walking;House hold activities    Currently in Pain? Yes    Pain Score 4     Pain Location Foot    Pain Orientation Right;Left    Pain Descriptors / Indicators Aching    Pain Type Chronic pain    Pain Onset More than a month ago    Pain Frequency Intermittent              [facility] PT Assessment - 11/15/19 0001      Assessment   Medical Diagnosis Bil foot pain    Referring Provider  (PT) [name]    Prior Therapy for back      Balance Screen   Has the patient fallen in the past 6 months No      Prior Function   Level of Independence Independent      Cognition   Overall Cognitive Status Within Functional Limits for tasks assessed      Posture/Postural Control   Posture Comments        Palpation   Palpation comment mild bil foot pronation L>R,; mild limitation for bil DF; Tender pl fasc bil;                       Objective measurements completed on examination: See above findings.       [facility] PT Treatment/Exercise - 11/15/19 0001      Self-Care   Self-Care Other Self-Care Comments    Other Self-Care Comments  custom orthotics/[employer] fabricated, and fit in pt shoes today/ good fit, and comfort with trial today       Lumbar Exercises: Stretches   Gastroc Stretch 3 reps;30 seconds  Gastroc Stretch Limitations bil                  PT Education - 11/15/19 2106    Education Details Pt education on orthotic use, precautions, wear time.    Person(s) Educated Patient    Methods Explanation    Comprehension Verbalized understanding            PT Short Term Goals - 11/15/19 2108      PT SHORT TERM GOAL #1   Title Pt will understand orthotic use, and best foot wear for improving foot pain    Time 3    Period Weeks    Status Achieved    Target Date 11/14/19             PT Long Term Goals - 08/02/19 0947      PT LONG TERM GOAL #1   Title Patient will be independent with ongoing/advanced HEP    Status Partially Met   08/02/19 - met for current HEP   Target Date 09/13/19      PT LONG TERM GOAL #2   Title Patient to demonstrate appropriate posture and body mechanics needed for daily activities    Status Achieved   08/02/19     PT LONG TERM GOAL #3   Title Patient to improve lumabr AROM to WNL/WFL without pain provocation    Status Partially Met    Target Date 09/13/19      PT LONG TERM GOAL #4   Title  Patient will demonstrate improved B LE strength to >/= 4+/5 for improved stability and ease of mobility    Status Partially Met    Target Date 09/13/19      PT LONG TERM GOAL #5   Title Patient will complete household chores and tolerate 12 hr work shift with pain no greater than 3-4/10    Status On-going   08/02/19 - pain still up to 6-7/10 with these activities   Target Date 09/13/19                  Plan - 11/15/19 2112    Clinical Impression Statement Pt with ongoing bil foot pain, mild/mod pes planus. She is wearing appropriate shoes for foot type, discussed best footwear. Custom Orthotics fabricated today with good fit, foot positioning, and comfort. Pt is not interested in continuing PT at this clinic, is already going to another facility. Discussed possibility of insurance not covering PT visits at 2 different facilities, pt states she wishes to still have todays visit, and proceed with orthotics. One time visit only today, for orthotic fabrication. Pt will call or return as needed for orthotic check.    Personal Factors and Comorbidities Comorbidity 3+;Past/Current Experience;Time since onset of injury/illness/exacerbation;Profession    Examination-Activity Limitations Locomotion Level;Stand    Examination-Participation Restrictions Cleaning;Community Activity;Yard Work;Occupation;Shop    Stability/Clinical Decision Making Stable/Uncomplicated    Clinical Decision Making Low    Rehab Potential Good    PT Frequency 1x / week    PT Duration 4 weeks   4-6 weeks   PT Treatment/Interventions ADLs/Self Care Home Management;Cryotherapy;Electrical Stimulation;Iontophoresis 83m/ml Dexamethasone;Moist Heat;Ultrasound;Functional mobility training;Therapeutic activities;Therapeutic exercise;Balance training;Neuromuscular re-education;Patient/family education;Manual techniques;Passive range of motion;Dry needling;Taping;Spinal Manipulations;Joint Manipulations;DME Instruction;Gait  training;Stair training;Orthotic Fit/Training    Consulted and Agree with Plan of Care Patient           Patient will benefit from skilled therapeutic intervention in order to improve the following deficits and impairments:  Decreased activity tolerance, Decreased mobility,  Decreased strength, Increased muscle spasms, Postural dysfunction, Pain, Improper body mechanics  Visit Diagnosis: Pain in left ankle and joints of left foot  Pain in right ankle and joints of right foot     Problem List Patient Active Problem List   Diagnosis Date Noted  . Myalgia 08/29/2019  . Tremor 03/16/2019  . Prediabetes 02/09/2019  . Hypothyroidism (acquired) 08/02/2018  . Nonallopathic lesion of rib cage 10/08/2017  . Antalgic gait 04/19/2017  . Polyarthralgia 02/26/2016  . GERD (gastroesophageal reflux disease) 02/17/2016  . Cervical lymphadenopathy 01/21/2016  . Cough 09/14/2015  . Nonallopathic lesion of thoracic region 08/23/2015  . Lumbar radiculopathy 08/02/2015  . 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 Scapular dysfunction 03/20/2015  . 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 Nonallopathic lesion of lumbosacral region 06/29/2014  . Nonallopathic lesion of sacral region 06/29/2014  . Nonallopathic lesion of pelvic region 06/29/2014  . Recurrent UTI 05/18/2013  . Back pain, acute 10/07/2010  . Allergic rhinitis 08/14/2010  . Essential hypertension, benign 01/22/2010    Lyndee Hensen, PT, DPT 9:22 PM  11/15/19    [facility] [place], [place], [address] Phone: [phone]   Fax:  [phone]  Name: [name] MRN: [id number] Date of Birth: 10-29-1975       PHYSICAL THERAPY DISCHARGE SUMMARY  Visits from Start of Care: 1 Plan: Patient agrees to discharge.  Patient goals were met. Patient is being discharged due to meeting the stated rehab goals.   ?????      Lyndee Hensen, PT, DPT 9:22 PM  11/15/19

## 2019-11-16 ENCOUNTER — Encounter: Payer: Self-pay | Admitting: Family Medicine

## 2019-11-27 ENCOUNTER — Ambulatory Visit: Payer: 59 | Admitting: Family Medicine

## 2019-11-27 ENCOUNTER — Other Ambulatory Visit: Payer: Self-pay

## 2019-11-27 ENCOUNTER — Encounter: Payer: Self-pay | Admitting: Family Medicine

## 2019-11-27 VITALS — BP 110/76 | HR 103 | Ht 63.0 in | Wt 148.0 lb

## 2019-11-27 DIAGNOSIS — G5702 Lesion of sciatic nerve, left lower limb: Secondary | ICD-10-CM | POA: Diagnosis not present

## 2019-11-27 DIAGNOSIS — M5416 Radiculopathy, lumbar region: Secondary | ICD-10-CM | POA: Diagnosis not present

## 2019-11-27 DIAGNOSIS — M999 Biomechanical lesion, unspecified: Secondary | ICD-10-CM

## 2019-11-27 NOTE — Assessment & Plan Note (Signed)
Using Netter's Orthopaedic Anatomy, reviewed with the patient the structures involved and how they related to diagnosis. The patient indicated understanding.   The patient was given a handout about classic piriformis stretching including [employer], Modified [employer], my self-described "Sink Stretch," and other piriformis rehab.  We also reviewed hip flexor and abductor strengthening, ham stretching  Rec deep massage, explained self-massage with ball

## 2019-11-27 NOTE — Progress Notes (Signed)
[facility] [place] Phone: [phone] Subjective:   [name], am serving as a scribe for Dr. Hulan Saas. This visit occurred during the SARS-CoV-2 public health emergency.  Safety protocols were in place, including screening questions prior to the visit, additional usage of staff PPE, and extensive cleaning of exam room while observing appropriate contact time as indicated for disinfecting solutions.   I'm seeing this patient by the request  of:  Binnie Rail, MD  CC: Back pain and allover pain  [id number]  [name] is a 44 y.o. female coming in with complaint of back and neck pain. OMT 10/02/2019. Patient states having more pain in the left outer thigh and the left outer calf as well as the left lower back.  Patient has been working with physical therapy doing more than nerve glide exercises. States that she has been having shooting pain in left hip that radiates down to the knee. Was experiencing this pain last visit. Pain intermittent with [name] pattern. Sitting in the car increases pain. Icing but this is not helping. Using gabapentin at night and Tylenol prn. Using Tramadol prn as well.    Taking: Patient is taking gabapentin, does low tone, over-the-counter medications         Reviewed prior external information including notes and imaging from previsou exam, outside providers and external EMR if available.   As well as notes that were available from care everywhere and other healthcare systems.  Past medical history, social, surgical and family history all reviewed in electronic medical record.  [name] unless stated regarding to the chief complaint.   Past Medical History:  Diagnosis Date  . Allergy   . Autoimmune disorder ([place])   . Crohn's disease ([place])   . Endometriosis   . Hypertension   . Kidney, medullary sponge   . Ovarian cyst   . Pre-eclampsia   . Raynaud's  disease   . Stress incontinence in female     Allergies  Allergen Reactions  . Lamisil [Terbinafine] Hives and Itching    In pill form     Review of Systems:  [name] headache, visual changes, nausea, vomiting, diarrhea, constipation, dizziness, abdominal pain, skin rash, fevers, chills, night sweats, weight loss, swollen lymph nodes, body aches, joint swelling, chest pain, shortness of breath, mood changes. POSITIVE muscle aches  Objective  Blood pressure 110/76, pulse (!) 103, height 5' 3"  (1.6 m), weight 148 lb (67.1 kg), SpO2 98 %.   General: [name] apparent distress alert and oriented x3 mood and affect normal, dressed appropriately.  HEENT: Pupils equal, extraocular movements intact  Respiratory: Patient's speak in full sentences and does not appear short of breath  Cardiovascular: [name] lower extremity edema, non tender, [name] erythema  Neuro: Cranial nerves II through XII are intact, neurovascularly intact in all extremities with 2+ DTRs and 2+ pulses.  Gait normal with good balance and coordination.  Low back exam does show some mild increase in tightness recently.  Patient has more tightness in the piriformis and the gluteal tendon noted.  Patient does have a positive Corky Sox but a negative straight leg test.  Patient is tender to palpation in the gluteal area.  4-5 strength of the hip abductor compared to the contralateral side.  Neurovascularly intact distally with 5 out of 5 strength and deep tendon reflexes intact.  Osteopathic findings  T3 extended rotated and side bent right inhaled rib T9 extended rotated and  side bent left L2 flexed rotated and side bent right Sacrum right on right       Assessment and Plan:    Nonallopathic problems  Decision today to treat with OMT was based on Physical Exam  After verbal consent patient was treated with HVLA, ME, FPR techniques in , rib, thoracic, lumbar, and sacral  areas  Patient tolerated the procedure well with improvement in  symptoms  Patient given exercises, stretches and lifestyle modifications  See medications in patient instructions if given  Patient will follow up in 4-8 weeks      The above documentation has been reviewed and is accurate and complete Lyndal Pulley, DO       Note: This dictation was prepared with Dragon dictation along with smaller phrase technology. Any transcriptional errors that result from this process are unintentional.

## 2019-11-27 NOTE — Assessment & Plan Note (Signed)
Patient is having some lumbar radiculopathy but on the left side and seems to be more secondary to a piriformis syndrome.  Patient is already working with physical therapy and encouraged him to change the different exercises.  Discussed which activities to doing which wants to avoid.  Increase activity slowly.  Discussed avoiding certain activities.  Follow-up again in 4 to 8 weeks.

## 2019-11-27 NOTE — Patient Instructions (Signed)
Tennis ball in car Tell PT piriformis syndrome Percussion massage gun could help Relace your shoes See me in 4-6 weeks

## 2019-12-04 ENCOUNTER — Other Ambulatory Visit: Payer: Self-pay | Admitting: Family Medicine

## 2019-12-04 MED FILL — AMLODIPINE BESYLATE 5 MG TA: 5 | 90 days supply | Qty: 90 | Fill #1

## 2019-12-05 ENCOUNTER — Other Ambulatory Visit: Payer: Self-pay | Admitting: Family Medicine

## 2019-12-05 MED FILL — LEVOTHYROXINE 50 MCG TABLET: 50 | 90 days supply | Qty: 90 | Fill #0

## 2019-12-05 MED FILL — VIIBRYD 20 MG TABLET: 20 | 30 days supply | Qty: 30 | Fill #0

## 2019-12-12 DIAGNOSIS — M545 Low back pain, unspecified: Secondary | ICD-10-CM | POA: Diagnosis not present

## 2019-12-15 ENCOUNTER — Encounter: Payer: Self-pay | Admitting: Family Medicine

## 2019-12-19 DIAGNOSIS — M545 Low back pain, unspecified: Secondary | ICD-10-CM | POA: Diagnosis not present

## 2019-12-26 DIAGNOSIS — M545 Low back pain, unspecified: Secondary | ICD-10-CM | POA: Diagnosis not present

## 2020-01-01 MED FILL — NITROFURANTOIN MCR 50 MG CA: 50 | 45 days supply | Qty: 45 | Fill #0

## 2020-01-03 DIAGNOSIS — G8929 Other chronic pain: Secondary | ICD-10-CM | POA: Diagnosis not present

## 2020-01-03 DIAGNOSIS — M2550 Pain in unspecified joint: Secondary | ICD-10-CM | POA: Diagnosis not present

## 2020-01-03 DIAGNOSIS — M545 Low back pain, unspecified: Secondary | ICD-10-CM | POA: Diagnosis not present

## 2020-01-03 DIAGNOSIS — R5382 Chronic fatigue, unspecified: Secondary | ICD-10-CM | POA: Diagnosis not present

## 2020-01-08 MED FILL — VIIBRYD 20 MG TABLET: 20 | 30 days supply | Qty: 30 | Fill #1

## 2020-01-10 ENCOUNTER — Ambulatory Visit: Payer: 59 | Admitting: Family Medicine

## 2020-01-10 ENCOUNTER — Other Ambulatory Visit: Payer: Self-pay

## 2020-01-10 ENCOUNTER — Encounter: Payer: Self-pay | Admitting: Family Medicine

## 2020-01-10 VITALS — BP 100/80 | HR 86 | Ht 63.0 in | Wt 150.0 lb

## 2020-01-10 DIAGNOSIS — M2550 Pain in unspecified joint: Secondary | ICD-10-CM | POA: Diagnosis not present

## 2020-01-10 DIAGNOSIS — Z01419 Encounter for gynecological examination (general) (routine) without abnormal findings: Secondary | ICD-10-CM | POA: Diagnosis not present

## 2020-01-10 DIAGNOSIS — M545 Low back pain, unspecified: Secondary | ICD-10-CM | POA: Diagnosis not present

## 2020-01-10 DIAGNOSIS — M999 Biomechanical lesion, unspecified: Secondary | ICD-10-CM

## 2020-01-10 DIAGNOSIS — Z6825 Body mass index (BMI) 25.0-25.9, adult: Secondary | ICD-10-CM | POA: Diagnosis not present

## 2020-01-10 NOTE — Progress Notes (Signed)
[facility] [address] [place] Phone: [phone] Subjective:   I [name] am serving as a Education administrator for Dr. Hulan Saas.  This visit occurred during the SARS-CoV-2 public health emergency.  Safety protocols were in place, including screening questions prior to the visit, additional usage of staff PPE, and extensive cleaning of exam room while observing appropriate contact time as indicated for disinfecting solutions.   I'm seeing this patient by the request  of:  Binnie Rail, MD  CC: Back pain and allover pain  [id number]  [name] is a 44 y.o. female coming in with complaint of back and neck pain. OMT 11/27/2019. Patient states she has been waking up with headaches and her neck has been bothering her. Still having issues with SI joint as well.  Patient did see another rheumatologist.  Most recent laboratory work-up has been unremarkable.  Having the diagnosis of now fibromyalgia.  Medications patient has been prescribed: Gabapentin, Synthroid, Viibryd  Taking: Yes         Reviewed prior external information including notes and imaging from previsou exam, outside providers and external EMR if available.   As well as notes that were available from care everywhere and other healthcare systems.  Past medical history, social, surgical and family history all reviewed in electronic medical record.  No pertanent information unless stated regarding to the chief complaint.   Past Medical History:  Diagnosis Date  . Allergy   . Autoimmune disorder ([place])   . Crohn's disease ([place])   . Endometriosis   . Hypertension   . Kidney, medullary sponge   . Ovarian cyst   . Pre-eclampsia   . Raynaud's disease   . Stress incontinence in female     Allergies  Allergen Reactions  . Lamisil [Terbinafine] Hives and Itching    In pill form     Review of Systems:  No headache, visual changes, nausea, vomiting,  diarrhea, constipation, dizziness, abdominal pain, skin rash, fevers, chills, night sweats, weight loss, swollen lymph nodes,  joint swelling, chest pain, shortness of breath, mood changes. POSITIVE muscle aches, body aches  Objective  Blood pressure 100/80, pulse 86, height 5' 3"  (1.6 m), weight 150 lb (68 kg), SpO2 98 %.   General: No apparent distress alert and oriented x3 mood and affect normal, dressed appropriately.  HEENT: Pupils equal, extraocular movements intact  Respiratory: Patient's speak in full sentences and does not appear short of breath  Cardiovascular: No lower extremity edema, non tender, no erythema  Neuro: Cranial nerves II through XII are intact, neurovascularly intact in all extremities with 2+ DTRs and 2+ pulses.  Gait normal with good balance and coordination.  MSK:  Non tender with full range of motion and good stability and symmetric strength and tone of shoulders, elbows, wrist, hip, knee and ankles bilaterally.  Back - Normal skin, Spine with normal alignment and no deformity.  No tenderness to vertebral process palpation.  Paraspinous muscles are not tender and without spasm.   Range of motion is full at neck and lumbar sacral regions  Osteopathic findings  C2 flexed rotated and side bent right C6 flexed rotated and side bent left T5 extended rotated and side bent right inhaled rib T8 extended rotated and side bent left L2 flexed rotated and side bent right Sacrum right on right       Assessment and Plan:    Nonallopathic problems  Decision today to treat with OMT was  based on Physical Exam  After verbal consent patient was treated with HVLA, ME, FPR techniques in cervical, rib, thoracic, lumbar, and sacral  areas  Patient tolerated the procedure well with improvement in symptoms  Patient given exercises, stretches and lifestyle modifications  See medications in patient instructions if given  Patient will follow up in 4-8 weeks      The  above documentation has been reviewed and is accurate and complete Lyndal Pulley, DO       Note: This dictation was prepared with Dragon dictation along with smaller phrase technology. Any transcriptional errors that result from this process are unintentional.

## 2020-01-10 NOTE — Patient Instructions (Addendum)
Good to see you [name] pillow New balance 990 or 1080 Start cardio 3 times a week 10 mins a day increasing 5 minutes each week See me again in 6-8 weeks

## 2020-01-10 NOTE — Assessment & Plan Note (Addendum)
Patient continues to have more of the polyarthralgia.  Seems to be more of a fibromyalgia.  Continue the vibryd at this time.  Encouraged her to start with more exercise will be very slowly.  Patient will consider some water aerobics.  Discussed different shoes that might help her at work as well.  Follow-up again 6 to 8 weeks

## 2020-01-19 MED FILL — MONTELUKAST SOD 10 MG TAB: 10 | 90 days supply | Qty: 90 | Fill #1

## 2020-01-25 ENCOUNTER — Other Ambulatory Visit: Payer: Self-pay | Admitting: Family Medicine

## 2020-01-25 ENCOUNTER — Other Ambulatory Visit: Payer: Self-pay | Admitting: Internal Medicine

## 2020-01-25 MED FILL — GABAPENTIN 100 MG CAPSULE: 100 | 90 days supply | Qty: 180 | Fill #0

## 2020-01-26 ENCOUNTER — Other Ambulatory Visit: Payer: Self-pay | Admitting: Internal Medicine

## 2020-01-26 MED FILL — traMADol HCL 50 MG TABS: 50 | 15 days supply | Qty: 30 | Fill #0

## 2020-02-05 MED FILL — VIIBRYD 20 MG TABLET: 20 | 30 days supply | Qty: 30 | Fill #2

## 2020-02-21 DIAGNOSIS — M545 Low back pain, unspecified: Secondary | ICD-10-CM | POA: Diagnosis not present

## 2020-02-21 DIAGNOSIS — M797 Fibromyalgia: Secondary | ICD-10-CM | POA: Diagnosis not present

## 2020-02-22 ENCOUNTER — Ambulatory Visit: Payer: 59 | Admitting: Family Medicine

## 2020-02-28 ENCOUNTER — Ambulatory Visit: Payer: 59 | Admitting: Internal Medicine

## 2020-03-04 ENCOUNTER — Other Ambulatory Visit: Payer: Self-pay | Admitting: Family Medicine

## 2020-03-04 MED FILL — VIIBRYD 20 MG TABLET: 20 | 30 days supply | Qty: 30 | Fill #3

## 2020-03-05 ENCOUNTER — Other Ambulatory Visit: Payer: Self-pay | Admitting: Family Medicine

## 2020-03-05 MED FILL — LEVOTHYROXINE 50 MCG TABLET: 50 | 90 days supply | Qty: 90 | Fill #0

## 2020-03-11 NOTE — Progress Notes (Unsigned)
Virtual Visit via Video Note  I connected with [name] on 03/11/20 at  1:45 PM EST by a video enabled telemedicine application and verified that I am speaking with the correct person using two identifiers.   I discussed the limitations of evaluation and management by telemedicine and the availability of in person appointments. The patient expressed understanding and agreed to proceed.  Present for the visit:  Myself, Dr Billey Gosling, [name].  The patient is currently at home and I am in the office.    No referring provider.    History of Present Illness: She is here for an acute visit for cold symptoms.   Her symptoms started Monday or Tuesday of last week  She started experiencing PND and ST.  Tuesday evening she was achy, had chills, fatigue.  She had a cough.  her cough is productive.  She feels sinus pressure and her mucus feels stuck in her sinuses.  She has had headaches.    She has tried taking mucinex D, allegra  Tested for Covid - negative last Wednesday.   She is drinking a lot.  She is trying to sleep.    Review of Systems  Constitutional: Positive for chills and malaise/fatigue. Negative for fever.  HENT: Positive for congestion, ear pain (pressure in ears, ringing) and sore throat.   Respiratory: Positive for cough and sputum production. Negative for shortness of breath and wheezing.   Neurological: Positive for headaches (sinus).      Social History   Socioeconomic History  . Marital status: Married    Spouse name: Not on file  . Number of children: 3  . Years of education: Not on file  . Highest education level: Not on file  Occupational History  . Occupation: Programmer, multimedia: [employer]  Tobacco Use  . Smoking status: Never Smoker  . Smokeless tobacco: Never Used  Vaping Use  . Vaping Use: Never used  Substance and Sexual Activity  . Alcohol use: Yes  . Drug use: No  . Sexual activity: Yes  Other Topics Concern  . 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   Follow Up Instructions:    I discussed the assessment and treatment plan with the patient. The patient was provided an opportunity to ask questions and all were answered. The patient agreed with the plan and demonstrated an understanding of the instructions.   The patient was advised to call back or seek an in-person evaluation if the symptoms worsen or if the condition fails to improve as anticipated.    Binnie Rail, MD

## 2020-03-12 ENCOUNTER — Encounter: Payer: Self-pay | Admitting: Internal Medicine

## 2020-03-12 ENCOUNTER — Ambulatory Visit: Payer: 59 | Admitting: Family Medicine

## 2020-03-12 ENCOUNTER — Telehealth (INDEPENDENT_AMBULATORY_CARE_PROVIDER_SITE_OTHER): Payer: 59 | Admitting: Internal Medicine

## 2020-03-12 ENCOUNTER — Ambulatory Visit: Payer: 59

## 2020-03-12 DIAGNOSIS — I10 Essential (primary) hypertension: Secondary | ICD-10-CM | POA: Diagnosis not present

## 2020-03-12 DIAGNOSIS — E039 Hypothyroidism, unspecified: Secondary | ICD-10-CM

## 2020-03-12 DIAGNOSIS — K219 Gastro-esophageal reflux disease without esophagitis: Secondary | ICD-10-CM

## 2020-03-12 DIAGNOSIS — R7303 Prediabetes: Secondary | ICD-10-CM

## 2020-03-12 DIAGNOSIS — M797 Fibromyalgia: Secondary | ICD-10-CM

## 2020-03-12 DIAGNOSIS — J0100 Acute maxillary sinusitis, unspecified: Secondary | ICD-10-CM

## 2020-03-12 DIAGNOSIS — J019 Acute sinusitis, unspecified: Secondary | ICD-10-CM | POA: Insufficient documentation

## 2020-03-12 MED ORDER — AMOXICILLIN-POT CLAVULANATE 875-125 MG PO TABS: 1.0000 | ORAL_TABLET | Freq: Two times a day (BID) | ORAL | 0 refills | Status: DC

## 2020-03-12 NOTE — Assessment & Plan Note (Signed)
Chronic GERD controlled Continue omeprazole 20 mg daily

## 2020-03-12 NOTE — Assessment & Plan Note (Signed)
Acute Likely bacterial  Start Augmentin 875-125 mg BID x 10 day otc cold medications Rest, fluid Call if no improvement  

## 2020-03-12 NOTE — Assessment & Plan Note (Signed)
Chronic  Lab Results  Component Value Date   HGBA1C 5.5 08/29/2019   Will recheck at her CPE in 6 months

## 2020-03-12 NOTE — Assessment & Plan Note (Signed)
Chronic Confirmed by rheumatology On Vibryd Trying to slowly increase her exercise Taking tramadol only as needed for pain - typically 1-2 times a week Continue tramadol 50 mg daily prn for more severe pain - she will try to avoid this

## 2020-03-12 NOTE — Assessment & Plan Note (Signed)
Chronic BP well controlled Continue amlodipine 5 mg daily cmp

## 2020-03-12 NOTE — Assessment & Plan Note (Signed)
Chronic Managed by Dr Tamala Julian

## 2020-03-18 ENCOUNTER — Other Ambulatory Visit: Payer: Self-pay | Admitting: Internal Medicine

## 2020-03-19 ENCOUNTER — Other Ambulatory Visit: Payer: Self-pay | Admitting: Internal Medicine

## 2020-03-19 DIAGNOSIS — M797 Fibromyalgia: Secondary | ICD-10-CM | POA: Diagnosis not present

## 2020-03-19 DIAGNOSIS — M545 Low back pain, unspecified: Secondary | ICD-10-CM | POA: Diagnosis not present

## 2020-03-19 MED FILL — AMLODIPINE BESYLATE 5 MG TA: 5 | 90 days supply | Qty: 90 | Fill #0

## 2020-03-28 NOTE — Progress Notes (Signed)
[place] Phone: [phone] Subjective:   [name], am serving as a scribe for Dr. Hulan Saas. This visit occurred during the SARS-CoV-2 public health emergency.  Safety protocols were in place, including screening questions prior to the visit, additional usage of staff PPE, and extensive cleaning of exam room while observing appropriate contact time as indicated for disinfecting solutions.   I'm seeing this patient by the request  of:  Binnie Rail, MD  CC: Back and neck pain follow-up  [id number]  Lexani Juliett Eastburn is a 45 y.o. female coming in with complaint of back and neck pain. OMT 01/10/2020. Patient states that she is having some left sided hip flexor issues. Feels like her hips are "off" causing the hip flexor issues.  Patient states more tightness than anything else.  Nothing that stopping her from activity.  Feels that her fibromyalgia is doing relatively well.  No significant flares at the moment.  Medications patient has been prescribed: Viibryd, Gabapentin          Reviewed prior external information including notes and imaging from previsou exam, outside providers and external EMR if available.   As well as notes that were available from care everywhere and other healthcare systems.  Past medical history, social, surgical and family history all reviewed in electronic medical record.  No pertanent information unless stated regarding to the chief complaint.   Past Medical History:  Diagnosis Date  . Allergy   . Autoimmune disorder ([place])   . Crohn's disease ([place])   . Endometriosis   . Hypertension   . Kidney, medullary sponge   . Ovarian cyst   . Pre-eclampsia   . Raynaud's disease   . Stress incontinence in female     Allergies  Allergen Reactions  . Lamisil [Terbinafine] Hives and Itching    In pill form     Review of Systems:  No headache, visual changes,  nausea, vomiting, diarrhea, constipation, dizziness, abdominal pain, skin rash, fevers, chills, night sweats, weight loss, swollen lymph nodes, body aches, joint swelling, chest pain, shortness of breath, mood changes. POSITIVE muscle aches  Objective  Blood pressure 120/88, pulse 93, height 5' 3"  (1.6 m), weight 149 lb (67.6 kg), SpO2 99 %.   General: No apparent distress alert and oriented x3 mood and affect normal, dressed appropriately.  HEENT: Pupils equal, extraocular movements intact  Respiratory: Patient's speak in full sentences and does not appear short of breath  Cardiovascular: No lower extremity edema, non tender, no erythema  Neuro: Cranial nerves II through XII are intact, neurovascularly intact in all extremities with 2+ DTRs and 2+ pulses.  Gait normal with good balance and coordination.  MSK:  Non tender with full range of motion and good stability and symmetric strength and tone of shoulders, elbows, wrist, hip, knee and ankles bilaterally.  Back -low back exam does have tightness noted.  Seems to be more around the left sacroiliac joint.  Pelvic shear noted on the left.  Osteopathic findings   T9 extended rotated and side bent right inhaled rib L2 flexed rotated and side bent right Sacrum left on left with a left pelvic shear       Assessment and Plan:  Scapular dysfunction Continues to be somewhat difficult.  Discussed with patient icing regimen and home exercise, which activities to do which wants to avoid.  Increase activity slowly.  Discussed icing regimen and home exercises.  Follow-up again  in 4 to 8 weeks    Nonallopathic problems  Decision today to treat with OMT was based on Physical Exam  After verbal consent patient was treated with HVLA, ME, FPR techniques in cervical, rib, thoracic, lumbar, and sacral  areas  Patient tolerated the procedure well with improvement in symptoms  Patient given exercises, stretches and lifestyle modifications  See  medications in patient instructions if given  Patient will follow up in 4-8 weeks      The above documentation has been reviewed and is accurate and complete Lyndal Pulley, DO       Note: This dictation was prepared with Dragon dictation along with smaller phrase technology. Any transcriptional errors that result from this process are unintentional.

## 2020-03-29 ENCOUNTER — Other Ambulatory Visit: Payer: Self-pay

## 2020-03-29 ENCOUNTER — Ambulatory Visit: Payer: 59 | Admitting: Family Medicine

## 2020-03-29 ENCOUNTER — Encounter: Payer: Self-pay | Admitting: Family Medicine

## 2020-03-29 VITALS — BP 120/88 | HR 93 | Ht 63.0 in | Wt 149.0 lb

## 2020-03-29 DIAGNOSIS — M999 Biomechanical lesion, unspecified: Secondary | ICD-10-CM | POA: Diagnosis not present

## 2020-03-29 DIAGNOSIS — M545 Low back pain, unspecified: Secondary | ICD-10-CM | POA: Diagnosis not present

## 2020-03-29 DIAGNOSIS — M899 Disorder of bone, unspecified: Secondary | ICD-10-CM | POA: Diagnosis not present

## 2020-03-29 DIAGNOSIS — M797 Fibromyalgia: Secondary | ICD-10-CM | POA: Diagnosis not present

## 2020-03-29 NOTE — Patient Instructions (Signed)
Good to see you Overall not too bad, keep moving See me again in 8-10 weeks

## 2020-03-29 NOTE — Assessment & Plan Note (Signed)
Continues to be somewhat difficult.  Discussed with patient icing regimen and home exercise, which activities to do which wants to avoid.  Increase activity slowly.  Discussed icing regimen and home exercises.  Follow-up again in 4 to 8 weeks

## 2020-04-08 ENCOUNTER — Other Ambulatory Visit: Payer: Self-pay | Admitting: Family Medicine

## 2020-04-09 ENCOUNTER — Other Ambulatory Visit: Payer: Self-pay | Admitting: Family Medicine

## 2020-04-09 MED FILL — VIIBRYD 20 MG TABLET: 20 | 30 days supply | Qty: 30 | Fill #0

## 2020-04-09 NOTE — Telephone Encounter (Signed)
Sent patient MyChart message to see if she is using medication.

## 2020-04-11 DIAGNOSIS — M545 Low back pain, unspecified: Secondary | ICD-10-CM | POA: Diagnosis not present

## 2020-04-11 DIAGNOSIS — M797 Fibromyalgia: Secondary | ICD-10-CM | POA: Diagnosis not present

## 2020-04-19 DIAGNOSIS — M797 Fibromyalgia: Secondary | ICD-10-CM | POA: Diagnosis not present

## 2020-04-19 DIAGNOSIS — M545 Low back pain, unspecified: Secondary | ICD-10-CM | POA: Diagnosis not present

## 2020-04-23 DIAGNOSIS — M545 Low back pain, unspecified: Secondary | ICD-10-CM | POA: Diagnosis not present

## 2020-04-23 DIAGNOSIS — M797 Fibromyalgia: Secondary | ICD-10-CM | POA: Diagnosis not present

## 2020-05-08 DIAGNOSIS — M545 Low back pain, unspecified: Secondary | ICD-10-CM | POA: Diagnosis not present

## 2020-05-08 DIAGNOSIS — M797 Fibromyalgia: Secondary | ICD-10-CM | POA: Diagnosis not present

## 2020-05-08 MED FILL — VIIBRYD 20 MG TABLET: 20 | 30 days supply | Qty: 30 | Fill #1

## 2020-05-14 DIAGNOSIS — M797 Fibromyalgia: Secondary | ICD-10-CM | POA: Diagnosis not present

## 2020-05-14 DIAGNOSIS — M545 Low back pain, unspecified: Secondary | ICD-10-CM | POA: Diagnosis not present

## 2020-05-15 ENCOUNTER — Telehealth: Payer: 59 | Admitting: Nurse Practitioner

## 2020-05-15 ENCOUNTER — Other Ambulatory Visit: Payer: Self-pay | Admitting: Nurse Practitioner

## 2020-05-15 DIAGNOSIS — N3000 Acute cystitis without hematuria: Secondary | ICD-10-CM

## 2020-05-15 MED ORDER — CEPHALEXIN 500 MG PO CAPS: 500.0000 mg | ORAL_CAPSULE | Freq: Two times a day (BID) | ORAL | 0 refills | Status: DC

## 2020-05-15 MED FILL — CEPHALEXIN 500 MG CAPSULE: 500 | 7 days supply | Qty: 14 | Fill #0

## 2020-05-15 NOTE — Progress Notes (Signed)
We are sorry that you are not feeling well.  Here is how we plan to help!  Based on what you shared with me it looks like you most likely have a simple urinary tract infection.  A UTI (Urinary Tract Infection) is a bacterial infection of the bladder.  Most cases of urinary tract infections are simple to treat but a key part of your care is to encourage you to drink plenty of fluids and watch your symptoms carefully.  I have prescribed Keflex 500 mg twice a day for 7 days.  Your symptoms should gradually improve. Call us if the burning in your urine worsens, you develop worsening fever, back pain or pelvic pain or if your symptoms do not resolve after completing the antibiotic.  Urinary tract infections can be prevented by drinking plenty of water to keep your body hydrated.  Also be sure when you wipe, wipe from front to back and don't hold it in!  If possible, empty your bladder every 4 hours.  Your e-visit answers were reviewed by a board certified advanced clinical practitioner to complete your personal care plan.  Depending on the condition, your plan could have included both over the counter or prescription medications.  If there is a problem please reply  once you have received a response from your provider.  Your safety is important to [place].  If you have drug allergies check your prescription carefully.    You can use MyChart to ask questions about today's visit, request a non-urgent call back, or ask for a work or school excuse for 24 hours related to this e-Visit. If it has been greater than 24 hours you will need to follow up with your provider, or enter a new e-Visit to address those concerns.   You will get an e-mail in the next two days asking about your experience.  I hope that your e-visit has been valuable and will speed your recovery. Thank you for using e-visits.  5-10 minutes spent reviewing and documenting in chart.

## 2020-05-23 DIAGNOSIS — M797 Fibromyalgia: Secondary | ICD-10-CM | POA: Diagnosis not present

## 2020-05-23 DIAGNOSIS — M545 Low back pain, unspecified: Secondary | ICD-10-CM | POA: Diagnosis not present

## 2020-05-28 DIAGNOSIS — M545 Low back pain, unspecified: Secondary | ICD-10-CM | POA: Diagnosis not present

## 2020-05-28 DIAGNOSIS — M797 Fibromyalgia: Secondary | ICD-10-CM | POA: Diagnosis not present

## 2020-05-30 ENCOUNTER — Ambulatory Visit: Payer: 59 | Admitting: Family Medicine

## 2020-06-05 ENCOUNTER — Ambulatory Visit: Payer: 59 | Admitting: Family Medicine

## 2020-06-05 MED FILL — VIIBRYD 20 MG TABLET: 20 | 30 days supply | Qty: 30 | Fill #2

## 2020-06-06 ENCOUNTER — Telehealth: Payer: Self-pay | Admitting: Internal Medicine

## 2020-06-06 DIAGNOSIS — M797 Fibromyalgia: Secondary | ICD-10-CM | POA: Diagnosis not present

## 2020-06-06 DIAGNOSIS — M545 Low back pain, unspecified: Secondary | ICD-10-CM | POA: Diagnosis not present

## 2020-06-06 NOTE — Telephone Encounter (Signed)
Patient states she has been having some symptoms of a UTI, she states she takes a preventive so she wont get UTIs but she doesn't think its working. She is wondering on what she needs to do if she needs to come in or go to the lab for a urinalysis

## 2020-06-06 NOTE — Progress Notes (Signed)
Subjective:    Patient ID: [name], female    DOB: 02/20/1976, 45 y.o.   MRN: [id number]  HPI The patient is here for an acute visit for ? uti.  She has medullary sponge kidney.   Had an e-visit 3-9 - diagnosed with UTI and started on keflex.  She completed that.  She did feel better, but a few days later she had mild dysuria.  It got better.  She had intercourse and took the macrobid and then started to feel bad again.     She has body aches, chills, mild dysuria, constant pressure in the bladder, urine odor a couple of time and some cloudiness.   She did an otc uti strips and it was positive.      Medications and allergies reviewed with patient and updated if appropriate.  Patient Active Problem List   Diagnosis Date Noted  . Acute sinus infection 03/12/2020  . Fibromyalgia 03/12/2020  . Piriformis syndrome, left 11/27/2019  . Myalgia 08/29/2019  . Tremor 03/16/2019  . Prediabetes 02/09/2019  . Hypothyroidism (acquired) 08/02/2018  . Nonallopathic lesion of rib cage 10/08/2017  . Antalgic gait 04/19/2017  . Polyarthralgia 02/26/2016  . GERD (gastroesophageal reflux disease) 02/17/2016  . Cervical lymphadenopathy 01/21/2016  . Cough 09/14/2015  . Nonallopathic lesion of thoracic region 08/23/2015  . Lumbar radiculopathy 08/02/2015  . 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 Scapular dysfunction 03/20/2015  . 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 Nonallopathic lesion of lumbosacral region 06/29/2014  . Nonallopathic lesion of sacral region 06/29/2014  . Nonallopathic lesion of pelvic region 06/29/2014  . Recurrent UTI 05/18/2013  . Back pain, acute 10/07/2010  . Allergic rhinitis 08/14/2010  . Essential hypertension, benign 01/22/2010    Current Outpatient Medications on File Prior to Visit  Medication Sig Dispense Refill  . acetaminophen (TYLENOL) 500 MG tablet Take 1,000 mg by mouth  every 6 (six) hours as needed for mild pain or headache.    . Acetaminophen-Caffeine (EXCEDRIN TENSION HEADACHE) 500-65 MG TABS Take 2 tablets by mouth daily as needed (headache).    Marland Kitchen amLODipine (NORVASC) 5 MG tablet TAKE 1 TABLET (5 MG TOTAL) BY MOUTH DAILY. 90 tablet 1  . Cholecalciferol (VITAMIN D3) 2000 units TABS Take 2,000 Units by mouth daily.    Marland Kitchen docusate sodium (COLACE) 100 MG capsule Take 100 mg by mouth daily.    . fluticasone (FLONASE) 50 MCG/ACT nasal spray Place 2 sprays into both nostrils daily. 16 g 6  . gabapentin (NEURONTIN) 100 MG capsule TAKE 2 CAPSULES BY MOUTH AT BEDTIME. 180 capsule 1  . levothyroxine (SYNTHROID) 50 MCG tablet TAKE 1 TABLET (50 MCG TOTAL) BY MOUTH DAILY. 90 tablet 0  . montelukast (SINGULAIR) 10 MG tablet Take 1 tablet (10 mg total) by mouth at bedtime. 90 tablet 1  . multivitamin-lutein (OCUVITE-LUTEIN) CAPS capsule Take 1 capsule by mouth daily.    . nitrofurantoin (MACRODANTIN) 50 MG capsule TAKE AFTER INTERCOURSE (Patient taking differently: Take 50 mg by mouth daily as needed (UTI prophylaxis). TAKE AFTER INTERCOURSE) 45 capsule 0  . omeprazole (PRILOSEC) 20 MG capsule Take 20 mg by mouth daily.    . Prasterone, DHEA, (DHEA ADVANCED FORMULA PO) Take 1 tablet by mouth daily.     Marland Kitchen Propylene Glycol (SYSTANE COMPLETE OP) Place 1 drop into both eyes daily.    . traMADol (ULTRAM) 50 MG tablet TAKE 1 TABLET (50 MG TOTAL) BY MOUTH EVERY  12 (TWELVE) HOURS AS NEEDED FOR MODERATE PAIN. 30 tablet 0  . VIIBRYD 20 MG TABS TAKE 1 TABLET (20 MG TOTAL) BY MOUTH DAILY. 30 tablet 3   No current facility-administered medications on file prior to visit.    Past Medical History:  Diagnosis Date  . Allergy   . Autoimmune disorder ([place])   . Crohn's disease ([place])   . Endometriosis   . Hypertension   . Kidney, medullary sponge   . Ovarian cyst   . Pre-eclampsia   . Raynaud's disease   . Stress incontinence in female     Past Surgical History:  Procedure  Laterality Date  . CHOLECYSTECTOMY    . DILATION AND CURETTAGE OF UTERUS    . ENDOMETRIAL ABLATION    . NOVASURE ABLATION    . TUBAL LIGATION      Social History   Socioeconomic History  . Marital status: Married    Spouse name: Not on file  . Number of children: 3  . Years of education: Not on file  . Highest education level: Not on file  Occupational History  . Occupation: Programmer, multimedia: [employer]  Tobacco Use  . Smoking status: Never Smoker  . Smokeless tobacco: Never Used  Vaping Use  . Vaping Use: Never used  Substance and Sexual Activity  . Alcohol use: Yes  . Drug use: No  . Sexual activity: Yes  Other Topics Concern  . 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 Breast cancer Mother   . Hypertension Father   . Hyperlipidemia Father   . Other Brother        lyme-chronic  . Stroke Maternal Grandmother   . Alcohol abuse Paternal Grandmother   . Heart disease Paternal Grandfather   . Colon cancer Neg Hx   . Esophageal cancer Neg Hx   . Rectal cancer Neg Hx     Review of Systems  Constitutional: Positive for chills and fatigue. Negative for fever.  Gastrointestinal: Negative for abdominal pain and nausea.  Genitourinary: Positive for dysuria. Negative for hematuria.       Bladder pressure, urine odor, urine cloudiness  Musculoskeletal: Negative for myalgias.  Neurological: Negative for dizziness and light-headedness.       Objective:   Vitals:   06/07/20 0808  BP: 114/62  Pulse: 100  Temp: 98.3 F (36.8 C)  SpO2: 91%   BP Readings from Last 3 Encounters:  06/07/20 114/62  03/29/20 120/88  01/10/20 100/80   Wt  Readings from Last 3 Encounters:  06/07/20 147 lb (66.7 kg)  03/29/20 149 lb (67.6 kg)  01/10/20 150 lb (68 kg)   Body mass index is 26.04 kg/m.   Physical Exam Constitutional:      General: She is not in acute distress.    Appearance: Normal appearance. She is not ill-appearing.  HENT:     Head: Normocephalic and atraumatic.  Abdominal:     General: There is no distension.     Palpations: Abdomen is soft.     Tenderness: There is abdominal tenderness (mild - suprapubic region). There is no  right CVA tenderness, left CVA tenderness, guarding or rebound.  Skin:    General: Skin is warm and dry.  Neurological:     Mental Status: She is alert.            Assessment & Plan:    See Problem List for Assessment and Plan of chronic medical problems.    This visit occurred during the SARS-CoV-2 public health emergency.  Safety protocols were in place, including screening questions prior to the visit, additional usage of staff PPE, and extensive cleaning of exam room while observing appropriate contact time as indicated for disinfecting solutions.

## 2020-06-06 NOTE — Telephone Encounter (Signed)
Appointment made for tomorrow

## 2020-06-07 ENCOUNTER — Ambulatory Visit: Payer: 59 | Admitting: Internal Medicine

## 2020-06-07 ENCOUNTER — Other Ambulatory Visit: Payer: Self-pay

## 2020-06-07 ENCOUNTER — Other Ambulatory Visit: Payer: Self-pay | Admitting: Internal Medicine

## 2020-06-07 ENCOUNTER — Encounter: Payer: Self-pay | Admitting: Internal Medicine

## 2020-06-07 VITALS — BP 114/62 | HR 100 | Temp 98.3°F | Ht 63.0 in | Wt 147.0 lb

## 2020-06-07 DIAGNOSIS — R300 Dysuria: Secondary | ICD-10-CM

## 2020-06-07 DIAGNOSIS — H2000 Unspecified acute and subacute iridocyclitis: Secondary | ICD-10-CM

## 2020-06-07 LAB — POCT URINALYSIS DIPSTICK
Bilirubin, UA: NEGATIVE
Glucose, UA: NEGATIVE
Ketones, UA: NEGATIVE
Nitrite, UA: NEGATIVE
Protein, UA: POSITIVE — AB
Spec Grav, UA: 1.015 (ref 1.010–1.025)
Urobilinogen, UA: 0.2 E.U./dL
pH, UA: 6 (ref 5.0–8.0)

## 2020-06-07 MED ORDER — SULFAMETHOXAZOLE-TRIMETHOPRIM 800-160 MG PO TABS: 1.0000 | ORAL_TABLET | Freq: Two times a day (BID) | ORAL | 0 refills | Status: DC

## 2020-06-07 MED FILL — SULFAMETHOXAZOLE-TMP DS TAB: 800-160 | 7 days supply | Qty: 14 | Fill #0

## 2020-06-07 NOTE — Patient Instructions (Signed)
Take the antibiotic as prescribed.  Take tylenol if needed.     Increase your water intake.   Call if no improvement     Urinary Tract Infection, Adult A urinary tract infection (UTI) is an infection of any part of the urinary tract, which includes the kidneys, ureters, bladder, and urethra. These organs make, store, and get rid of urine in the body. UTI can be a bladder infection (cystitis) or kidney infection (pyelonephritis). What are the causes? This infection may be caused by fungi, viruses, or bacteria. Bacteria are the most common cause of UTIs. This condition can also be caused by repeated incomplete emptying of the bladder during urination. What increases the risk? This condition is more likely to develop if:  You ignore your need to urinate or hold urine for long periods of time.  You do not empty your bladder completely during urination.  You wipe back to front after urinating or having a bowel movement, if you are female.  You are uncircumcised, if you are female.  You are constipated.  You have a urinary catheter that stays in place (indwelling).  You have a weak defense (immune) system.  You have a medical condition that affects your bowels, kidneys, or bladder.  You have diabetes.  You take antibiotic medicines frequently or for long periods of time, and the antibiotics no longer work well against certain types of infections (antibiotic resistance).  You take medicines that irritate your urinary tract.  You are exposed to chemicals that irritate your urinary tract.  You are female.  What are the signs or symptoms? Symptoms of this condition include:  Fever.  Frequent urination or passing small amounts of urine frequently.  Needing to urinate urgently.  Pain or burning with urination.  Urine that smells bad or unusual.  Cloudy urine.  Pain in the lower abdomen or back.  Trouble urinating.  Blood in the urine.  Vomiting or being less hungry than  normal.  Diarrhea or abdominal pain.  Vaginal discharge, if you are female.  How is this diagnosed? This condition is diagnosed with a medical history and physical exam. You will also need to provide a urine sample to test your urine. Other tests may be done, including:  Blood tests.  Sexually transmitted disease (STD) testing.  If you have had more than one UTI, a cystoscopy or imaging studies may be done to determine the cause of the infections. How is this treated? Treatment for this condition often includes a combination of two or more of the following:  Antibiotic medicine.  Other medicines to treat less common causes of UTI.  Over-the-counter medicines to treat pain.  Drinking enough water to stay hydrated.  Follow these instructions at home:  Take over-the-counter and prescription medicines only as told by your health care provider.  If you were prescribed an antibiotic, take it as told by your health care provider. Do not stop taking the antibiotic even if you start to feel better.  Avoid alcohol, caffeine, tea, and carbonated beverages. They can irritate your bladder.  Drink enough fluid to keep your urine clear or pale yellow.  Keep all follow-up visits as told by your health care provider. This is important.  Make sure to: ? Empty your bladder often and completely. Do not hold urine for long periods of time. ? Empty your bladder before and after sex. ? Wipe from front to back after a bowel movement if you are female. Use each tissue one time when you  wipe. Contact a health care provider if:  You have back pain.  You have a fever.  You feel nauseous or vomit.  Your symptoms do not get better after 3 days.  Your symptoms go away and then return. Get help right away if:  You have severe back pain or lower abdominal pain.  You are vomiting and cannot keep down any medicines or water. This information is not intended to replace advice given to you by  your health care provider. Make sure you discuss any questions you have with your health care provider. Document Released: 12/03/2004 Document Revised: 08/07/2015 Document Reviewed: 01/14/2015 Elsevier Interactive Patient Education  [employer].

## 2020-06-07 NOTE — Addendum Note (Signed)
Addended by: Terence Lux A on: 06/07/2020 08:34 AM   Modules accepted: Orders

## 2020-06-07 NOTE — Addendum Note (Signed)
Addended by: Terence Lux A on: 06/07/2020 11:21 AM   Modules accepted: Orders

## 2020-06-07 NOTE — Assessment & Plan Note (Signed)
Acute Urine dip consistent with UTI Will send urine for culture Take the antibiotic as prescribed.  Bactrim DS bid x 1 week Take tylenol if needed.   Increase your water intake.  Call if no improvement

## 2020-06-07 NOTE — Addendum Note (Signed)
Addended by: Cresenciano Lick on: 06/07/2020 12:07 PM   Modules accepted: Orders

## 2020-06-09 LAB — URINE CULTURE

## 2020-06-10 ENCOUNTER — Other Ambulatory Visit: Payer: Self-pay | Admitting: Family Medicine

## 2020-06-10 ENCOUNTER — Other Ambulatory Visit (HOSPITAL_COMMUNITY): Payer: Self-pay

## 2020-06-10 MED ORDER — LEVOTHYROXINE SODIUM 50 MCG PO TABS
50.0000 ug | ORAL_TABLET | Freq: Every day | ORAL | 0 refills | Status: DC
  Filled 2020-06-10: qty 90, 90d supply, fill #0

## 2020-06-10 NOTE — Progress Notes (Signed)
[place] Phone: [phone] Subjective:   [name], am serving as a scribe for Dr. Hulan Saas. This visit occurred during the SARS-CoV-2 public health emergency.  Safety protocols were in place, including screening questions prior to the visit, additional usage of staff PPE, and extensive cleaning of exam room while observing appropriate contact time as indicated for disinfecting solutions.   I'm seeing this patient by the request  of:  Binnie Rail, MD  CC: Low back and allover pain follow-up  [id number]  [name] is a 45 y.o. female coming in with complaint of back and neck pain. OMT 03/29/2020. Patient states that her R groin, L SI and R scapula.   Medications patient has been prescribed: Viibryd  Taking: Yes         Reviewed prior external information including notes and imaging from previsou exam, outside providers and external EMR if available.   As well as notes that were available from care everywhere and other healthcare systems.  Past medical history, social, surgical and family history all reviewed in electronic medical record.  No pertanent information unless stated regarding to the chief complaint.   Past Medical History:  Diagnosis Date  . Allergy   . Autoimmune disorder ([place])   . Crohn's disease ([place])   . Endometriosis   . Hypertension   . Kidney, medullary sponge   . Ovarian cyst   . Pre-eclampsia   . Raynaud's disease   . Stress incontinence in female     Allergies  Allergen Reactions  . Lamisil [Terbinafine] Hives and Itching    In pill form     Review of Systems:  No headache, visual changes, nausea, vomiting, diarrhea, constipation, dizziness, abdominal pain, skin rash, fevers, chills, night sweats, weight loss, swollen lymph nodes,  joint swelling, chest pain, shortness of breath, mood changes. POSITIVE muscle aches, body aches  Objective   Blood pressure 118/80, pulse 84, height 5' 3"  (1.6 m), weight 147 lb (66.7 kg), SpO2 98 %.   General: No apparent distress alert and oriented x3 mood and affect normal, dressed appropriately.  HEENT: Pupils equal, extraocular movements intact  Respiratory: Patient's speak in full sentences and does not appear short of breath  Cardiovascular: No lower extremity edema, non tender, no erythema    Gait normal with good balance and coordination.  MSK:  Non tender with full range of motion and good stability and symmetric strength and tone of shoulders, elbows, wrist, hip, knee and ankles bilaterally.  Back -no back exam does have some loss of lordosis.  Some tenderness to palpation in the paraspinal musculature of the lumbar spine right greater than left.  Tightness of the right parascapular region as well.  Neck also has some tightness on the right side but negative Spurling's.  Mild limited sidebending and rotation to the right  Osteopathic findings  C2 flexed rotated and side bent right C6 flexed rotated and side bent left T3 extended rotated and side bent right inhaled rib T9 extended rotated and side bent left L2 flexed rotated and side bent right Sacrum right on right       Assessment and Plan:  Fibromyalgia Mild exacerbation chronic problem.  Patient recently did have a UTI.  Likely contributing to some of the increase in the back discomfort.  Discussed home exercises, icing regimen, which activities to avoid patient responded very well to osteopathic manipulation today.  Follow-up with me again  6 to 8 weeks.    Nonallopathic problems  Decision today to treat with OMT was based on Physical Exam  After verbal consent patient was treated with HVLA, ME, FPR techniques in cervical, rib, thoracic, lumbar, and sacral  areas  Patient tolerated the procedure well with improvement in symptoms  Patient given exercises, stretches and lifestyle modifications  See medications in  patient instructions if given  Patient will follow up in 4-8 weeks      The above documentation has been reviewed and is accurate and complete Lyndal Pulley, DO       Note: This dictation was prepared with Dragon dictation along with smaller phrase technology. Any transcriptional errors that result from this process are unintentional.

## 2020-06-11 ENCOUNTER — Other Ambulatory Visit (HOSPITAL_COMMUNITY): Payer: Self-pay

## 2020-06-11 ENCOUNTER — Ambulatory Visit: Payer: 59 | Admitting: Family Medicine

## 2020-06-11 ENCOUNTER — Encounter: Payer: Self-pay | Admitting: Family Medicine

## 2020-06-11 ENCOUNTER — Other Ambulatory Visit: Payer: Self-pay

## 2020-06-11 VITALS — BP 118/80 | HR 84 | Ht 63.0 in | Wt 147.0 lb

## 2020-06-11 DIAGNOSIS — M545 Low back pain, unspecified: Secondary | ICD-10-CM | POA: Diagnosis not present

## 2020-06-11 DIAGNOSIS — M999 Biomechanical lesion, unspecified: Secondary | ICD-10-CM

## 2020-06-11 DIAGNOSIS — M797 Fibromyalgia: Secondary | ICD-10-CM

## 2020-06-11 NOTE — Patient Instructions (Signed)
Feel better No to bad today See me in 6-8 weeks

## 2020-06-11 NOTE — Assessment & Plan Note (Signed)
Mild exacerbation chronic problem.  Patient recently did have a UTI.  Likely contributing to some of the increase in the back discomfort.  Discussed home exercises, icing regimen, which activities to avoid patient responded very well to osteopathic manipulation today.  Follow-up with me again 6 to 8 weeks.

## 2020-06-17 ENCOUNTER — Other Ambulatory Visit (HOSPITAL_COMMUNITY): Payer: Self-pay

## 2020-06-17 MED FILL — Amlodipine Besylate Tab 5 MG (Base Equivalent): ORAL | 90 days supply | Qty: 90 | Fill #0 | Status: AC

## 2020-06-21 DIAGNOSIS — M545 Low back pain, unspecified: Secondary | ICD-10-CM | POA: Diagnosis not present

## 2020-06-21 DIAGNOSIS — M797 Fibromyalgia: Secondary | ICD-10-CM | POA: Diagnosis not present

## 2020-06-25 DIAGNOSIS — M797 Fibromyalgia: Secondary | ICD-10-CM | POA: Diagnosis not present

## 2020-06-25 DIAGNOSIS — M545 Low back pain, unspecified: Secondary | ICD-10-CM | POA: Diagnosis not present

## 2020-07-02 DIAGNOSIS — H5203 Hypermetropia, bilateral: Secondary | ICD-10-CM | POA: Diagnosis not present

## 2020-07-02 DIAGNOSIS — H52223 Regular astigmatism, bilateral: Secondary | ICD-10-CM | POA: Diagnosis not present

## 2020-07-02 DIAGNOSIS — H524 Presbyopia: Secondary | ICD-10-CM | POA: Diagnosis not present

## 2020-07-07 MED FILL — Vilazodone HCl Tab 20 MG: ORAL | 30 days supply | Qty: 30 | Fill #0 | Status: AC

## 2020-07-08 ENCOUNTER — Other Ambulatory Visit (HOSPITAL_COMMUNITY): Payer: Self-pay

## 2020-07-11 DIAGNOSIS — M797 Fibromyalgia: Secondary | ICD-10-CM | POA: Diagnosis not present

## 2020-07-11 DIAGNOSIS — M545 Low back pain, unspecified: Secondary | ICD-10-CM | POA: Diagnosis not present

## 2020-07-29 DIAGNOSIS — M545 Low back pain, unspecified: Secondary | ICD-10-CM | POA: Diagnosis not present

## 2020-07-29 DIAGNOSIS — M797 Fibromyalgia: Secondary | ICD-10-CM | POA: Diagnosis not present

## 2020-08-01 NOTE — Progress Notes (Signed)
[facility] [address] [place] Phone: [phone] Subjective:   I [name] am serving as a Education administrator for Dr. Hulan Saas.  This visit occurred during the SARS-CoV-2 public health emergency.  Safety protocols were in place, including screening questions prior to the visit, additional usage of staff PPE, and extensive cleaning of exam room while observing appropriate contact time as indicated for disinfecting solutions.   I'm seeing this patient by the request  of:  Binnie Rail, MD  CC: back and neck pain   [id number]  [name] is a 45 y.o. female coming in with complaint of back and neck pain. OMT 06/11/2020. Patient states she had a muscle spasm on Monday that is getting better. Pain was in the low back.   Medications patient has been prescribed: Synthroid, Vilazadone  Taking:yes        Reviewed prior external information including notes and imaging from previsou exam, outside providers and external EMR if available.   As well as notes that were available from care everywhere and other healthcare systems.  Past medical history, social, surgical and family history all reviewed in electronic medical record.  No pertanent information unless stated regarding to the chief complaint.   Past Medical History:  Diagnosis Date  . Allergy   . Autoimmune disorder ([place])   . Crohn's disease ([place])   . Endometriosis   . Hypertension   . Kidney, medullary sponge   . Ovarian cyst   . Pre-eclampsia   . Raynaud's disease   . Stress incontinence in female     Allergies  Allergen Reactions  . Lamisil [Terbinafine] Hives and Itching    In pill form     Review of Systems:  No headache, visual changes, nausea, vomiting, diarrhea, constipation, dizziness, abdominal pain, skin rash, fevers, chills, night sweats, weight loss, swollen lymph nodes, body aches, joint swelling, chest pain, shortness of breath, mood changes.  POSITIVE muscle aches  Objective  Blood pressure 110/80, pulse 67, height 5' 3"  (1.6 m), weight 149 lb (67.6 kg), SpO2 100 %.   General: No apparent distress alert and oriented x3 mood and affect normal, dressed appropriately.  HEENT: Pupils equal, extraocular movements intact  Respiratory: Patient's speak in full sentences and does not appear short of breath  Cardiovascular: No lower extremity edema, non tender, no erythema  Gait normal with good balance and coordination.  MSK:  Non tender with full range of motion and good stability and symmetric strength and tone of shoulders, elbows, wrist, hip, knee and ankles bilaterally.  Back -low back exam shows some mild increase in tightness around the paraspinal musculature of the lumbar spine right greater than left.  Mild tightness also noted in the right parascapular region neck does have some mild tightness with sidebending bilaterally.  Negative Spurling's.  5 out of 5 strength of the upper extremities  Osteopathic findings  C2 flexed rotated and side bent right C6 flexed rotated and side bent left T3 extended rotated and side bent right inhaled rib T8 extended rotated and side bent left L2 flexed rotated and side bent right Sacrum right on right      Assessment and Plan:  Fibromyalgia Chronic with mild exacerbation. Overall though was doing well, no change in management patient has responded relatively well though to osteopathic manipulation.  Patient does have the gabapentin.     Nonallopathic problems  Decision today to treat with OMT was based on Physical Exam  After verbal consent patient was treated with HVLA, ME, FPR techniques in cervical, rib, thoracic, lumbar, and sacral  areas avoided HVLA on the cervical spine  Patient tolerated the procedure well with improvement in symptoms  Patient given exercises, stretches and lifestyle modifications  See medications in patient instructions if given  Patient will follow up  in 4-8 weeks     The above documentation has been reviewed and is accurate and complete Lyndal Pulley, DO       Note: This dictation was prepared with Dragon dictation along with smaller phrase technology. Any transcriptional errors that result from this process are unintentional.

## 2020-08-02 ENCOUNTER — Encounter: Payer: Self-pay | Admitting: Family Medicine

## 2020-08-02 ENCOUNTER — Other Ambulatory Visit: Payer: Self-pay

## 2020-08-02 ENCOUNTER — Ambulatory Visit: Payer: 59 | Admitting: Family Medicine

## 2020-08-02 VITALS — BP 110/80 | HR 67 | Ht 63.0 in | Wt 149.0 lb

## 2020-08-02 DIAGNOSIS — M797 Fibromyalgia: Secondary | ICD-10-CM | POA: Diagnosis not present

## 2020-08-02 DIAGNOSIS — M9903 Segmental and somatic dysfunction of lumbar region: Secondary | ICD-10-CM | POA: Diagnosis not present

## 2020-08-02 DIAGNOSIS — M9904 Segmental and somatic dysfunction of sacral region: Secondary | ICD-10-CM | POA: Diagnosis not present

## 2020-08-02 DIAGNOSIS — M9902 Segmental and somatic dysfunction of thoracic region: Secondary | ICD-10-CM

## 2020-08-02 DIAGNOSIS — M9901 Segmental and somatic dysfunction of cervical region: Secondary | ICD-10-CM

## 2020-08-02 DIAGNOSIS — M9908 Segmental and somatic dysfunction of rib cage: Secondary | ICD-10-CM

## 2020-08-02 NOTE — Assessment & Plan Note (Addendum)
Chronic with mild exacerbation. Overall though was doing well, no change in management patient has responded relatively well though to osteopathic manipulation.  Patient does have the gabapentin.  Patient is continuing all her other medications at this time.  Discussed posture and ergonomics, which activities to potentially avoid.  Follow-up again in 4 further manipulation in 6 to 8 weeks

## 2020-08-02 NOTE — Patient Instructions (Addendum)
Had some good movement Glad job is working out See me in 6-8 weeks

## 2020-08-06 ENCOUNTER — Other Ambulatory Visit: Payer: Self-pay | Admitting: Family Medicine

## 2020-08-06 ENCOUNTER — Other Ambulatory Visit (HOSPITAL_COMMUNITY): Payer: Self-pay

## 2020-08-06 MED ORDER — VIIBRYD 20 MG PO TABS
1.0000 | ORAL_TABLET | Freq: Every day | ORAL | 3 refills | Status: DC
  Filled 2020-08-06: qty 30, 30d supply, fill #0
  Filled 2020-09-03: qty 30, 30d supply, fill #1
  Filled 2020-10-07: qty 30, 30d supply, fill #2
  Filled 2020-11-05: qty 30, 30d supply, fill #3

## 2020-08-09 ENCOUNTER — Other Ambulatory Visit (HOSPITAL_COMMUNITY): Payer: Self-pay

## 2020-08-09 MED ORDER — CARESTART COVID-19 HOME TEST VI KIT
PACK | 0 refills | Status: DC
  Filled 2020-08-09: qty 4, 4d supply, fill #0

## 2020-08-19 ENCOUNTER — Other Ambulatory Visit (HOSPITAL_COMMUNITY): Payer: Self-pay

## 2020-08-19 DIAGNOSIS — M797 Fibromyalgia: Secondary | ICD-10-CM | POA: Diagnosis not present

## 2020-08-19 DIAGNOSIS — M545 Low back pain, unspecified: Secondary | ICD-10-CM | POA: Diagnosis not present

## 2020-08-20 ENCOUNTER — Other Ambulatory Visit (HOSPITAL_COMMUNITY): Payer: Self-pay

## 2020-08-20 ENCOUNTER — Other Ambulatory Visit: Payer: Self-pay | Admitting: Internal Medicine

## 2020-08-21 ENCOUNTER — Other Ambulatory Visit (HOSPITAL_COMMUNITY): Payer: Self-pay

## 2020-08-21 MED ORDER — TRAMADOL HCL 50 MG PO TABS
50.0000 mg | ORAL_TABLET | Freq: Two times a day (BID) | ORAL | 0 refills | Status: DC | PRN
  Filled 2020-08-21: qty 30, 15d supply, fill #0

## 2020-08-21 MED ORDER — NITROFURANTOIN MACROCRYSTAL 50 MG PO CAPS
ORAL_CAPSULE | ORAL | 0 refills | Status: DC
  Filled 2020-08-21: qty 45, 45d supply, fill #0

## 2020-09-02 ENCOUNTER — Other Ambulatory Visit (HOSPITAL_COMMUNITY): Payer: Self-pay

## 2020-09-03 ENCOUNTER — Other Ambulatory Visit (HOSPITAL_COMMUNITY): Payer: Self-pay

## 2020-09-03 ENCOUNTER — Other Ambulatory Visit: Payer: Self-pay | Admitting: Family Medicine

## 2020-09-03 MED ORDER — LEVOTHYROXINE SODIUM 50 MCG PO TABS
50.0000 ug | ORAL_TABLET | Freq: Every day | ORAL | 0 refills | Status: DC
  Filled 2020-09-03: qty 90, 90d supply, fill #0

## 2020-09-10 ENCOUNTER — Other Ambulatory Visit (HOSPITAL_COMMUNITY): Payer: Self-pay

## 2020-09-10 ENCOUNTER — Other Ambulatory Visit: Payer: Self-pay | Admitting: Internal Medicine

## 2020-09-10 MED ORDER — AMLODIPINE BESYLATE 5 MG PO TABS
5.0000 mg | ORAL_TABLET | Freq: Every day | ORAL | 1 refills | Status: DC
  Filled 2020-09-10 – 2020-09-18 (×2): qty 90, 90d supply, fill #0
  Filled 2020-12-17: qty 90, 90d supply, fill #1

## 2020-09-16 ENCOUNTER — Encounter: Payer: 59 | Admitting: Internal Medicine

## 2020-09-16 DIAGNOSIS — M797 Fibromyalgia: Secondary | ICD-10-CM | POA: Diagnosis not present

## 2020-09-16 DIAGNOSIS — M25512 Pain in left shoulder: Secondary | ICD-10-CM | POA: Diagnosis not present

## 2020-09-16 DIAGNOSIS — M545 Low back pain, unspecified: Secondary | ICD-10-CM | POA: Diagnosis not present

## 2020-09-17 NOTE — Progress Notes (Signed)
[name] [place] Phone: [phone] Subjective:   Rito Ehrlich, am serving as a scribe for Dr. Hulan Saas.  I'm seeing this patient by the request  of:  Binnie Rail, MD  CC: back and neck pain   [id number]  [name] is a 45 y.o. female coming in with complaint of back and neck pain. OMT 08/02/2020. Patient states back and neck are doing well, but is having L front shoulder pain that radiates down to the elbow. States that if she moves her arm a certain way it will hurt. Not aware of anything that has happened to cause the pain, has had this pain since last visit but it was not that bad, but has since been increasingly worse. Describes pain as a very sore tender muscle.   Medications patient has been prescribed: Synthroid, Vybrid          Past Medical History:  Diagnosis Date   Allergy    Autoimmune disorder ([place])    Crohn's disease ([place])    Endometriosis    Hypertension    Kidney, medullary sponge    Ovarian cyst    Pre-eclampsia    Raynaud's disease    Stress incontinence in female     Allergies  Allergen Reactions   Lamisil [Terbinafine] Hives and Itching    In pill form     Review of Systems:  No headache, visual changes, nausea, vomiting, diarrhea, constipation, dizziness, abdominal pain, skin rash, fevers, chills, night sweats, weight loss, swollen lymph nodes,  joint swelling, chest pain, shortness of breath, mood changes. POSITIVE muscle aches, body aches  Objective  Blood pressure 118/80, pulse 90, height 5' 3"  (1.6 m), weight 148 lb (67.1 kg), SpO2 98 %.   General: No apparent distress alert and oriented x3 mood and affect normal, dressed appropriately.  HEENT: Pupils equal, extraocular movements intact  Respiratory: Patient's speak in full sentences and does not appear short of breath  Cardiovascular: No lower extremity edema, non tender, no erythema  Patient's  musculoskeletal complaints seem to be more in the muscle region some cells.  Could be secondary to the fibromyalgia.  Shoulder has full range of motion but does lack the last 2 to 5 degrees with internal and external range of motion compared to the contralateral side the rotator cuff strength is intact. Low back exam does have some mild loss of lordosis.  Osteopathic findings   T3 extended rotated and side bent right inhaled rib T9 extended rotated and side bent left L2 flexed rotated and side bent right Sacrum right on right Pelvic shear noted right      Assessment and Plan:  Scapular dysfunction Chronic problem and stable.  Discussed icing regimen and home exercises, discussed avoiding certain activities.  Follow-up with me again in 6 to 8 weeks  Fibromyalgia Continuing to play a role.  Patient's left shoulder though does show some mild signs of impingement or potentially early frozen shoulder.  Discussed icing regimen and home exercises.  Increase activity slowly.  Follow-up with me again in 4 to 8 weeks.   Nonallopathic problems  Decision today to treat with OMT was based on Physical Exam  After verbal consent patient was treated with HVLA, ME, FPR techniques in pelvis , rib, thoracic, lumbar, and sacral  areas  Patient tolerated the procedure well with improvement in symptoms  Patient given exercises, stretches and lifestyle modifications  See medications in patient instructions if  given  Patient will follow up in 4-8 weeks      The above documentation has been reviewed and is accurate and complete Lorraine Pulley, DO        Note: This dictation was prepared with Dragon dictation along with smaller phrase technology. Any transcriptional errors that result from this process are unintentional.

## 2020-09-18 ENCOUNTER — Other Ambulatory Visit (HOSPITAL_COMMUNITY): Payer: Self-pay

## 2020-09-20 ENCOUNTER — Other Ambulatory Visit: Payer: Self-pay

## 2020-09-20 ENCOUNTER — Encounter: Payer: Self-pay | Admitting: Family Medicine

## 2020-09-20 ENCOUNTER — Ambulatory Visit: Payer: 59 | Admitting: Family Medicine

## 2020-09-20 VITALS — BP 118/80 | HR 90 | Ht 63.0 in | Wt 148.0 lb

## 2020-09-20 DIAGNOSIS — M899 Disorder of bone, unspecified: Secondary | ICD-10-CM

## 2020-09-20 DIAGNOSIS — M9905 Segmental and somatic dysfunction of pelvic region: Secondary | ICD-10-CM | POA: Diagnosis not present

## 2020-09-20 DIAGNOSIS — M797 Fibromyalgia: Secondary | ICD-10-CM

## 2020-09-20 DIAGNOSIS — M9908 Segmental and somatic dysfunction of rib cage: Secondary | ICD-10-CM | POA: Diagnosis not present

## 2020-09-20 DIAGNOSIS — M9903 Segmental and somatic dysfunction of lumbar region: Secondary | ICD-10-CM | POA: Diagnosis not present

## 2020-09-20 DIAGNOSIS — M9902 Segmental and somatic dysfunction of thoracic region: Secondary | ICD-10-CM | POA: Diagnosis not present

## 2020-09-20 DIAGNOSIS — M9904 Segmental and somatic dysfunction of sacral region: Secondary | ICD-10-CM

## 2020-09-20 NOTE — Assessment & Plan Note (Signed)
Continuing to play a role.  Patient's left shoulder though does show some mild signs of impingement or potentially early frozen shoulder.  Discussed icing regimen and home exercises.  Increase activity slowly.  Follow-up with me again in 4 to 8 weeks.

## 2020-09-20 NOTE — Assessment & Plan Note (Signed)
Chronic problem and stable.  Discussed icing regimen and home exercises, discussed avoiding certain activities.  Follow-up with me again in 6 to 8 weeks

## 2020-09-20 NOTE — Patient Instructions (Addendum)
Good to see you  Shoulder exercises given Keep working out tho because you are doing well overall Keep the ROM of the shoulder See me again in 6 weeks

## 2020-10-03 NOTE — Progress Notes (Signed)
Subjective:    Patient ID: [name], female    DOB: August 21, 1975, 45 y.o.   MRN: [id number]   This visit occurred during the SARS-CoV-2 public health emergency.  Safety protocols were in place, including screening questions prior to the visit, additional usage of staff PPE, and extensive cleaning of exam room while observing appropriate contact time as indicated for disinfecting solutions.    HPI She is here for a physical exam.   Still dealing with fibromyalgia and joint issues.   She is doing PT  She is doing weight watchers.    Medications and allergies reviewed with patient and updated if appropriate.  Patient Active Problem List   Diagnosis Date Noted   Chronic pain syndrome 10/04/2020   Acute sinus infection 03/12/2020   Fibromyalgia 03/12/2020   Piriformis syndrome, left 11/27/2019   Tremor 03/16/2019   Prediabetes 02/09/2019   Hypothyroidism (acquired) 08/02/2018   Nonallopathic lesion of rib cage 10/08/2017   Antalgic gait 04/19/2017   Polyarthralgia 02/26/2016   GERD (gastroesophageal reflux disease) 02/17/2016   Cervical lymphadenopathy 01/21/2016   Cough 09/14/2015   Nonallopathic lesion of thoracic region 08/23/2015   Lumbar radiculopathy 08/02/2015   SI (sacroiliac) joint dysfunction 03/20/2015   Scapular dysfunction 03/20/2015   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     Acetaminophen-Caffeine  (EXCEDRIN TENSION HEADACHE) 500-65 MG TABS Take 2 tablets by mouth daily as needed (headache).     amLODipine (NORVASC) 5 MG tablet Take 1 tablet (5 mg total) by mouth daily. 90 tablet 1   Cholecalciferol (VITAMIN D3) 2000 units TABS Take 2,000 Units by mouth daily.     docusate sodium (COLACE) 100 MG capsule Take 100 mg by mouth daily.     fluticasone (FLONASE) 50 MCG/ACT nasal spray Place 2 sprays into both nostrils daily. 16 g 6   levothyroxine (SYNTHROID) 50 MCG tablet Take 1 tablet (50 mcg total) by mouth daily. 90 tablet 0   multivitamin-lutein (OCUVITE-LUTEIN) CAPS capsule Take 1 capsule by mouth daily.     nitrofurantoin (MACRODANTIN) 50 MG capsule TAKE AFTER INTERCOURSE AS DIRECTED 45 capsule 0   omeprazole (PRILOSEC) 20 MG capsule Take 20 mg by mouth daily.     Prasterone, DHEA, (DHEA ADVANCED FORMULA PO) Take 1 tablet by mouth daily.      Propylene Glycol (SYSTANE COMPLETE OP) Place 1 drop into both eyes daily.     traMADol (ULTRAM) 50 MG tablet Take 1 tablet (50 mg total) by mouth every 12 (twelve) hours as needed for moderate pain 30 tablet 0   Vilazodone HCl (VIIBRYD) 20 MG TABS Take 1 tablet (20 mg total) by mouth daily. 30 tablet 3   montelukast (SINGULAIR) 10 MG tablet TAKE 1 TABLET (10 MG TOTAL) BY MOUTH AT BEDTIME. 90 tablet 1   No current facility-administered medications on file prior to visit.    Past Medical History:  Diagnosis Date   Allergy    Autoimmune disorder ([place])  Crohn's disease ([place])    Endometriosis    Hypertension    Kidney, medullary sponge    Ovarian cyst    Pre-eclampsia    Raynaud's disease    Stress incontinence in female     Past Surgical History:  Procedure Laterality Date   CHOLECYSTECTOMY     DILATION AND CURETTAGE OF UTERUS     ENDOMETRIAL ABLATION     NOVASURE ABLATION     TUBAL LIGATION      Social History   Socioeconomic History   Marital status: Married    Spouse name: Not on file   Number of children: 3   Years of  education: Not on file   Highest education level: Not on file  Occupational History   Occupation: Programmer, multimedia: [employer]  Tobacco Use   Smoking status: Never   Smokeless tobacco: Never  Vaping Use   Vaping Use: Never used  Substance and Sexual Activity   Alcohol use: Yes   Drug use: No   Sexual activity: Yes  Other Topics Concern   Not on file  Social History Narrative   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, OR   Social Determinants of Health   Financial Resource Strain: Not on file  Food Insecurity: Not on file  Transportation Needs: Not on file  Physical Activity: Not on file  Stress: Not on file  Social Connections: Not on file    Family History  Problem Relation Age of Onset   Breast cancer Mother    Hypertension Father    Hyperlipidemia Father    Other Brother        lyme-chronic   Stroke Maternal Grandmother    Alcohol abuse Paternal Grandmother    Heart disease Paternal Grandfather    Colon cancer Neg Hx    Esophageal cancer Neg Hx    Rectal cancer Neg Hx     Review of Systems  Constitutional:  Negative for chills and fever.  Eyes:  Negative for visual disturbance.  Respiratory:  Negative for cough, shortness of breath and wheezing.   Cardiovascular:  Negative for chest pain, palpitations and leg swelling.  Gastrointestinal:  Positive for constipation. Negative for abdominal pain, blood in stool, diarrhea and nausea.  Genitourinary:  Negative for dysuria and hematuria.  Musculoskeletal:  Positive for arthralgias (left shoulder), back pain (chronic SI joint) and myalgias (some days).  Skin:  Negative for color change and rash.  Neurological:  Positive for headaches (occ). Negative for dizziness and light-headedness.  Psychiatric/Behavioral:  Negative for dysphoric mood. The patient is nervous/anxious (occ).       Objective:   Vitals:   10/04/20 1306  BP: 112/80  Pulse: 92  Temp: 98.6 F (37 C)  SpO2: 99%   Filed Weights   10/04/20  1306  Weight: 149 lb (67.6 kg)   Body mass index is 26.39 kg/m.  BP Readings from Last 3 Encounters:  10/04/20 112/80  09/20/20 118/80  08/02/20 110/80    Wt Readings from Last 3 Encounters:  10/04/20 149 lb (67.6 kg)  09/20/20 148 lb (67.1 kg)  08/02/20 149 lb (67.6 kg)     Physical Exam Constitutional: She appears well-developed and well-nourished. No distress.  HENT:  Head: Normocephalic and atraumatic.  Right Ear: External ear normal. Normal ear canal and TM Left Ear: External ear normal.  Normal ear canal and TM Mouth/Throat: Oropharynx is clear and moist.  Eyes: Conjunctivae and EOM are normal.  Neck: Neck  supple. No tracheal deviation present. No thyromegaly present.  No carotid bruit  Cardiovascular: Normal rate, regular rhythm and normal heart sounds.   No murmur heard.  No edema. Pulmonary/Chest: Effort normal and breath sounds normal. No respiratory distress. She has no wheezes. She has no rales.  Breast: deferred   Abdominal: Soft. She exhibits no distension. There is no tenderness.  Lymphadenopathy: She has no cervical adenopathy.  Skin: Skin is warm and dry. She is not diaphoretic.  Psychiatric: She has a normal mood and affect. Her behavior is normal.        Assessment & Plan:   Physical exam: Screening blood work  ordered Exercise  doing PT, stretches, some weights Diet  - doing weight watchers Weight  working on weight loss - doing [employer] Substance abuse   none   Health Maintenance  Topic Date Due   PAP SMEAR-Modifier  06/17/2020   MAMMOGRAM  09/25/2020   INFLUENZA VACCINE  10/07/2020   TETANUS/TDAP  09/12/2023   COVID-19 Vaccine  Completed   HIV Screening  Completed   Pneumococcal Vaccine 20-3 Years old  Aged Out   HPV VACCINES  Aged Out   Hepatitis C Screening  Discontinued          See Problem List for Assessment and Plan of chronic medical problems.

## 2020-10-03 NOTE — Patient Instructions (Addendum)
Blood work was ordered.     Medications changes include :   none     Please followup in 6 months    Health Maintenance, Female Adopting a healthy lifestyle and getting preventive care are important in promoting health and wellness. Ask your health care provider about: The right schedule for you to have regular tests and exams. Things you can do on your own to prevent diseases and keep yourself healthy. What should I know about diet, weight, and exercise? Eat a healthy diet  Eat a diet that includes plenty of vegetables, fruits, low-fat dairy products, and lean protein. Do not eat a lot of foods that are high in solid fats, added sugars, or sodium.  Maintain a healthy weight Body mass index (BMI) is used to identify weight problems. It estimates body fat based on height and weight. Your health care provider can help determineyour BMI and help you achieve or maintain a healthy weight. Get regular exercise Get regular exercise. This is one of the most important things you can do for your health. Most adults should: Exercise for at least 150 minutes each week. The exercise should increase your heart rate and make you sweat (moderate-intensity exercise). Do strengthening exercises at least twice a week. This is in addition to the moderate-intensity exercise. Spend less time sitting. Even light physical activity can be beneficial. Watch cholesterol and blood lipids Have your blood tested for lipids and cholesterol at 47 years of age, then havethis test every 5 years. Have your cholesterol levels checked more often if: Your lipid or cholesterol levels are high. You are older than 47 years of age. You are at high risk for heart disease. What should I know about cancer screening? Depending on your health history and family history, you may need to have cancer screening at various ages. This may include screening for: Breast cancer. Cervical cancer. Colorectal cancer. Skin  cancer. Lung cancer. What should I know about heart disease, diabetes, and high blood pressure? Blood pressure and heart disease High blood pressure causes heart disease and increases the risk of stroke. This is more likely to develop in people who have high blood pressure readings, are of African descent, or are overweight. Have your blood pressure checked: Every 3-5 years if you are 44-67 years of age. Every year if you are 12 years old or older. Diabetes Have regular diabetes screenings. This checks your fasting blood sugar level. Have the screening done: Once every three years after age 35 if you are at a normal weight and have a low risk for diabetes. More often and at a younger age if you are overweight or have a high risk for diabetes. What should I know about preventing infection? Hepatitis B If you have a higher risk for hepatitis B, you should be screened for this virus. Talk with your health care provider to find out if you are at risk forhepatitis B infection. Hepatitis C Testing is recommended for: Everyone born from [birth year] through 1967. Anyone with known risk factors for hepatitis C. Sexually transmitted infections (STIs) Get screened for STIs, including gonorrhea and chlamydia, if: You are sexually active and are younger than 47 years of age. You are older than 47 years of age and your health care provider tells you that you are at risk for this type of infection. Your sexual activity has changed since you were last screened, and you are at increased risk for chlamydia or gonorrhea. Ask your health care provider if you are  at risk. Ask your health care provider about whether you are at high risk for HIV. Your health care provider may recommend a prescription medicine to help prevent HIV infection. If you choose to take medicine to prevent HIV, you should first get tested for HIV. You should then be tested every 3 months for as long as you are taking the medicine. Pregnancy If  you are about to stop having your period (premenopausal) and you may become pregnant, seek counseling before you get pregnant. Take 400 to 800 micrograms (mcg) of folic acid every day if you become pregnant. Ask for birth control (contraception) if you want to prevent pregnancy. Osteoporosis and menopause Osteoporosis is a disease in which the bones lose minerals and strength with aging. This can result in bone fractures. If you are 24 years old or older, or if you are at risk for osteoporosis and fractures, ask your health care provider if you should: Be screened for bone loss. Take a calcium or vitamin D supplement to lower your risk of fractures. Be given hormone replacement therapy (HRT) to treat symptoms of menopause. Follow these instructions at home: Lifestyle Do not use any products that contain nicotine or tobacco, such as cigarettes, e-cigarettes, and chewing tobacco. If you need help quitting, ask your health care provider. Do not use street drugs. Do not share needles. Ask your health care provider for help if you need support or information about quitting drugs. Alcohol use Do not drink alcohol if: Your health care provider tells you not to drink. You are pregnant, may be pregnant, or are planning to become pregnant. If you drink alcohol: Limit how much you use to 0-1 drink a day. Limit intake if you are breastfeeding. Be aware of how much alcohol is in your drink. In the [place] one drink equals one 12 oz bottle of beer (355 mL), one 5 oz glass of wine (148 mL), or one 1 oz glass of hard liquor (44 mL). General instructions Schedule regular health, dental, and eye exams. Stay current with your vaccines. Tell your health care provider if: You often feel depressed. You have ever been abused or do not feel safe at home. Summary Adopting a healthy lifestyle and getting preventive care are important in promoting health and wellness. Follow your health care provider's  instructions about healthy diet, exercising, and getting tested or screened for diseases. Follow your health care provider's instructions on monitoring your cholesterol and blood pressure. This information is not intended to replace advice given to you by your health care provider. Make sure you discuss any questions you have with your healthcare provider. Document Revised: 02/16/2018 Document Reviewed: 02/16/2018 Elsevier Patient Education  2022 [employer].

## 2020-10-04 ENCOUNTER — Other Ambulatory Visit: Payer: Self-pay

## 2020-10-04 ENCOUNTER — Encounter: Payer: Self-pay | Admitting: Internal Medicine

## 2020-10-04 ENCOUNTER — Ambulatory Visit (INDEPENDENT_AMBULATORY_CARE_PROVIDER_SITE_OTHER): Payer: 59 | Admitting: Internal Medicine

## 2020-10-04 VITALS — BP 112/80 | HR 92 | Temp 98.6°F | Ht 63.0 in | Wt 149.0 lb

## 2020-10-04 DIAGNOSIS — R7303 Prediabetes: Secondary | ICD-10-CM | POA: Diagnosis not present

## 2020-10-04 DIAGNOSIS — Z0000 Encounter for general adult medical examination without abnormal findings: Secondary | ICD-10-CM | POA: Diagnosis not present

## 2020-10-04 DIAGNOSIS — M2550 Pain in unspecified joint: Secondary | ICD-10-CM

## 2020-10-04 DIAGNOSIS — M797 Fibromyalgia: Secondary | ICD-10-CM | POA: Diagnosis not present

## 2020-10-04 DIAGNOSIS — E039 Hypothyroidism, unspecified: Secondary | ICD-10-CM | POA: Diagnosis not present

## 2020-10-04 DIAGNOSIS — I10 Essential (primary) hypertension: Secondary | ICD-10-CM | POA: Diagnosis not present

## 2020-10-04 DIAGNOSIS — N390 Urinary tract infection, site not specified: Secondary | ICD-10-CM

## 2020-10-04 DIAGNOSIS — K219 Gastro-esophageal reflux disease without esophagitis: Secondary | ICD-10-CM | POA: Diagnosis not present

## 2020-10-04 DIAGNOSIS — G894 Chronic pain syndrome: Secondary | ICD-10-CM | POA: Insufficient documentation

## 2020-10-04 LAB — COMPREHENSIVE METABOLIC PANEL
ALT: 15 U/L (ref 0–35)
AST: 17 U/L (ref 0–37)
Albumin: 4.3 g/dL (ref 3.5–5.2)
Alkaline Phosphatase: 94 U/L (ref 39–117)
BUN: 19 mg/dL (ref 6–23)
CO2: 30 mEq/L (ref 19–32)
Calcium: 9.3 mg/dL (ref 8.4–10.5)
Chloride: 102 mEq/L (ref 96–112)
Creatinine, Ser: 0.85 mg/dL (ref 0.40–1.20)
GFR: 81.64 mL/min (ref >60.00)
Glucose, Bld: 83 mg/dL (ref 70–99)
Potassium: 3.7 mEq/L (ref 3.5–5.1)
Sodium: 138 mEq/L (ref 135–145)
Total Bilirubin: 0.3 mg/dL (ref 0.2–1.2)
Total Protein: 6.9 g/dL (ref 6.0–8.3)

## 2020-10-04 LAB — LIPID PANEL
Cholesterol: 191 mg/dL (ref 0–200)
HDL: 54.2 mg/dL (ref >39.00)
LDL Cholesterol: 118 mg/dL — ABNORMAL HIGH (ref 0–99)
NonHDL: 136.66
Total CHOL/HDL Ratio: 4
Triglycerides: 95 mg/dL (ref 0.0–149.0)
VLDL: 19 mg/dL (ref 0.0–40.0)

## 2020-10-04 LAB — CBC WITH DIFFERENTIAL/PLATELET
Basophils Absolute: 0.1 10*3/uL (ref 0.0–0.1)
Basophils Relative: 1.1 % (ref 0.0–3.0)
Eosinophils Absolute: 0.3 10*3/uL (ref 0.0–0.7)
Eosinophils Relative: 3.7 % (ref 0.0–5.0)
HCT: 39.4 % (ref 36.0–46.0)
Hemoglobin: 13.2 g/dL (ref 12.0–15.0)
Lymphocytes Relative: 33.2 % (ref 12.0–46.0)
Lymphs Abs: 3.1 10*3/uL (ref 0.7–4.0)
MCHC: 33.5 g/dL (ref 30.0–36.0)
MCV: 92.6 fl (ref 78.0–100.0)
Monocytes Absolute: 0.6 10*3/uL (ref 0.1–1.0)
Monocytes Relative: 6.4 % (ref 3.0–12.0)
Neutro Abs: 5.2 10*3/uL (ref 1.4–7.7)
Neutrophils Relative %: 55.6 % (ref 43.0–77.0)
Platelets: 297 10*3/uL (ref 150.0–400.0)
RBC: 4.26 Mil/uL (ref 3.87–5.11)
RDW: 12.2 % (ref 11.5–15.5)
WBC: 9.4 10*3/uL (ref 4.0–10.5)

## 2020-10-04 LAB — HEMOGLOBIN A1C: Hgb A1c MFr Bld: 5.7 % (ref 4.6–6.5)

## 2020-10-04 LAB — TSH: TSH: 1.07 u[IU]/mL (ref 0.35–5.50)

## 2020-10-04 NOTE — Assessment & Plan Note (Signed)
Chronic GERD controlled Continue omeprazole 20 mg daily

## 2020-10-04 NOTE — Assessment & Plan Note (Signed)
Chronic Check a1c Low sugar / carb diet Stressed regular exercise  

## 2020-10-04 NOTE — Assessment & Plan Note (Signed)
Chronic  Clinically euthyroid Currently taking levothyroxine 50 mcg daily Check tsh  Titrate med dose if needed

## 2020-10-04 NOTE — Assessment & Plan Note (Signed)
Chronic BP well controlled Continue amlodipine 5 mg daily cmp

## 2020-10-04 NOTE — Addendum Note (Signed)
Addended by: Boris Lown B on: 10/04/2020 01:53 PM   Modules accepted: Orders

## 2020-10-04 NOTE — Assessment & Plan Note (Signed)
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

## 2020-10-04 NOTE — Assessment & Plan Note (Addendum)
Chronic Fibromyalgia, polymyalgia Taking Viibryd 20 mg daily-prescribed by sports medicine Continue tramadol 50 mg twice daily as needed for moderate pain-she uses this only as needed

## 2020-10-07 ENCOUNTER — Other Ambulatory Visit (HOSPITAL_COMMUNITY): Payer: Self-pay

## 2020-10-09 DIAGNOSIS — M545 Low back pain, unspecified: Secondary | ICD-10-CM | POA: Diagnosis not present

## 2020-10-09 DIAGNOSIS — M797 Fibromyalgia: Secondary | ICD-10-CM | POA: Diagnosis not present

## 2020-10-09 DIAGNOSIS — M25512 Pain in left shoulder: Secondary | ICD-10-CM | POA: Diagnosis not present

## 2020-10-14 DIAGNOSIS — M25512 Pain in left shoulder: Secondary | ICD-10-CM | POA: Diagnosis not present

## 2020-10-14 DIAGNOSIS — M797 Fibromyalgia: Secondary | ICD-10-CM | POA: Diagnosis not present

## 2020-10-14 DIAGNOSIS — M545 Low back pain, unspecified: Secondary | ICD-10-CM | POA: Diagnosis not present

## 2020-10-17 ENCOUNTER — Other Ambulatory Visit: Payer: Self-pay

## 2020-10-17 DIAGNOSIS — Z1211 Encounter for screening for malignant neoplasm of colon: Secondary | ICD-10-CM

## 2020-10-17 NOTE — Progress Notes (Signed)
Ordered today

## 2020-10-23 ENCOUNTER — Other Ambulatory Visit: Payer: Self-pay | Admitting: Obstetrics & Gynecology

## 2020-10-23 DIAGNOSIS — Z1231 Encounter for screening mammogram for malignant neoplasm of breast: Secondary | ICD-10-CM

## 2020-10-25 ENCOUNTER — Ambulatory Visit: Payer: 59 | Admitting: Family Medicine

## 2020-10-28 DIAGNOSIS — M25512 Pain in left shoulder: Secondary | ICD-10-CM | POA: Diagnosis not present

## 2020-10-28 DIAGNOSIS — M797 Fibromyalgia: Secondary | ICD-10-CM | POA: Diagnosis not present

## 2020-10-28 DIAGNOSIS — M545 Low back pain, unspecified: Secondary | ICD-10-CM | POA: Diagnosis not present

## 2020-11-05 ENCOUNTER — Other Ambulatory Visit (HOSPITAL_COMMUNITY): Payer: Self-pay

## 2020-11-08 ENCOUNTER — Ambulatory Visit: Payer: 59 | Admitting: Family Medicine

## 2020-11-08 ENCOUNTER — Other Ambulatory Visit: Payer: Self-pay

## 2020-11-08 VITALS — BP 122/90 | HR 106 | Ht 63.0 in | Wt 145.0 lb

## 2020-11-08 DIAGNOSIS — M797 Fibromyalgia: Secondary | ICD-10-CM

## 2020-11-08 DIAGNOSIS — M9901 Segmental and somatic dysfunction of cervical region: Secondary | ICD-10-CM | POA: Diagnosis not present

## 2020-11-08 DIAGNOSIS — Z1211 Encounter for screening for malignant neoplasm of colon: Secondary | ICD-10-CM | POA: Diagnosis not present

## 2020-11-08 DIAGNOSIS — M9903 Segmental and somatic dysfunction of lumbar region: Secondary | ICD-10-CM | POA: Diagnosis not present

## 2020-11-08 DIAGNOSIS — M9908 Segmental and somatic dysfunction of rib cage: Secondary | ICD-10-CM

## 2020-11-08 DIAGNOSIS — M7502 Adhesive capsulitis of left shoulder: Secondary | ICD-10-CM

## 2020-11-08 DIAGNOSIS — M9904 Segmental and somatic dysfunction of sacral region: Secondary | ICD-10-CM | POA: Diagnosis not present

## 2020-11-08 DIAGNOSIS — M9902 Segmental and somatic dysfunction of thoracic region: Secondary | ICD-10-CM | POA: Diagnosis not present

## 2020-11-08 NOTE — Patient Instructions (Addendum)
Injected L shoulder today No gymnastics See me again in 6-8 weeks

## 2020-11-08 NOTE — Progress Notes (Signed)
[place] Phone: [phone] Subjective:   [name], am serving as a scribe for Dr. Hulan Saas. This visit occurred during the SARS-CoV-2 public health emergency.  Safety protocols were in place, including screening questions prior to the visit, additional usage of staff PPE, and extensive cleaning of exam room while observing appropriate contact time as indicated for disinfecting solutions.   I'm seeing this patient by the request  of:  Lorraine Rail, MD  CC: back pain follow up   [id number]  [name] is a 45 y.o. female coming in with complaint of back and neck pain. OMT on 09/20/2020. Patient states that she has been doing exercises for adhesive capsulitis. Pain with arm at 90/90. Patient would like to decrease inflammation as she is unable to raise arm comfortably overhead. Patient has clicking in L shoulder with IR. Patient is also having R shoulder pain in same location at L.   Medications patient has been prescribed: Synthroid  Taking:         Reviewed prior external information including notes and imaging from previsou exam, outside providers and external EMR if available.   As well as notes that were available from care everywhere and other healthcare systems.  Past medical history, social, surgical and family history all reviewed in electronic medical record.  [name] unless stated regarding to the chief complaint.   Past Medical History:  Diagnosis Date   Allergy    Autoimmune disorder ([place])    Crohn's disease ([place])    Endometriosis    Hypertension    Kidney, medullary sponge    Ovarian cyst    Pre-eclampsia    Raynaud's disease    Stress incontinence in female     Allergies  Allergen Reactions   Lamisil [Terbinafine] Hives and Itching    In pill form     Review of Systems:  [name] headache, visual changes, nausea, vomiting, diarrhea,  constipation, dizziness, abdominal pain, skin rash, fevers, chills, night sweats, weight loss, swollen lymph nodes, body aches, joint swelling, chest pain, shortness of breath, mood changes. POSITIVE muscle aches  Objective  Blood pressure 122/90, pulse (!) 106, height 5' 3"  (1.6 m), weight 145 lb (65.8 kg), SpO2 98 %.   General: [name] apparent distress alert and oriented x3 mood and affect normal, dressed appropriately.  HEENT: Pupils equal, extraocular movements intact  Respiratory: Patient's speak in full sentences and does not appear short of breath  Cardiovascular: [name] lower extremity edema, non tender, [name] erythema  Neuro: Cranial nerves II through XII are intact, neurovascularly intact in all extremities with 2+ DTRs and 2+ pulses.  Gait normal with good balance and coordination.  MSK:  Non tender with full range of motion and good stability and symmetric strength and tone of shoulders, elbows, wrist, hip, knee and ankles bilaterally.  Back exam shows tightness around shoulders bilaterally. Mild decrease in external ROM of left shoulder, also has decrease FF of 165.  Tight with FABER bilaterally neg SLT    Osteopathic findings  C2 flexed rotated and side bent right C6 flexed rotated and side bent left T3 extended rotated and side bent right inhaled rib T9 extended rotated and side bent left L2 flexed rotated and side bent right Sacrum right on right  After informed written and verbal consent, patient was seated on exam table. Left shoulder was prepped with alcohol swab and utilizing posterior approach, patient's right glenohumeral space  was injected with 4:1  marcaine 0.5%: Kenalog 2m/dL. Patient tolerated the procedure well without immediate complications.     Assessment and Plan:  Fibromyalgia Chronic with mild exacerbation. 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 Discuss monitor and continue with ROM. RTC strength intact  RTC in 6  weeks    Nonallopathic problems  Decision today to treat with OMT was based on Physical Exam  After verbal consent patient was treated with HVLA, ME, FPR techniques in cervical, rib, thoracic, lumbar, and sacral  areas  Patient tolerated the procedure well with improvement in symptoms  Patient given exercises, stretches and lifestyle modifications  See medications in patient instructions if given  Patient will follow up in 4-8 weeks    The above documentation has been reviewed and is accurate and complete ZLyndal Pulley DO        Note: This dictation was prepared with Dragon dictation along with smaller phrase technology. Any transcriptional errors that result from this process are unintentional.

## 2020-11-09 ENCOUNTER — Encounter: Payer: Self-pay | Admitting: Family Medicine

## 2020-11-09 DIAGNOSIS — M7500 Adhesive capsulitis of unspecified shoulder: Secondary | ICD-10-CM | POA: Insufficient documentation

## 2020-11-09 NOTE — Assessment & Plan Note (Signed)
Chronic with mild exacerbation. 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 Discuss monitor and continue with ROM. RTC strength intact  RTC in 6 weeks

## 2020-11-09 NOTE — Assessment & Plan Note (Signed)
Frozen shoulder noted, will continue with ROM, worsening pain so patient decided left shoulder injection done today.  Discussed icing regimen and home exercises.  Patient will continue to work on range of motion.  Follow-up with me again in 6 to 8 weeks

## 2020-11-13 DIAGNOSIS — M797 Fibromyalgia: Secondary | ICD-10-CM | POA: Diagnosis not present

## 2020-11-15 ENCOUNTER — Ambulatory Visit: Payer: 59

## 2020-11-15 LAB — COLOGUARD: Cologuard: NEGATIVE

## 2020-12-05 ENCOUNTER — Other Ambulatory Visit: Payer: Self-pay | Admitting: Family Medicine

## 2020-12-05 DIAGNOSIS — D2239 Melanocytic nevi of other parts of face: Secondary | ICD-10-CM | POA: Diagnosis not present

## 2020-12-05 DIAGNOSIS — D225 Melanocytic nevi of trunk: Secondary | ICD-10-CM | POA: Diagnosis not present

## 2020-12-05 DIAGNOSIS — L728 Other follicular cysts of the skin and subcutaneous tissue: Secondary | ICD-10-CM | POA: Diagnosis not present

## 2020-12-05 DIAGNOSIS — D485 Neoplasm of uncertain behavior of skin: Secondary | ICD-10-CM | POA: Diagnosis not present

## 2020-12-06 ENCOUNTER — Other Ambulatory Visit: Payer: Self-pay | Admitting: Family Medicine

## 2020-12-06 ENCOUNTER — Other Ambulatory Visit (HOSPITAL_COMMUNITY): Payer: Self-pay

## 2020-12-09 ENCOUNTER — Other Ambulatory Visit (HOSPITAL_COMMUNITY): Payer: Self-pay

## 2020-12-10 ENCOUNTER — Other Ambulatory Visit (HOSPITAL_COMMUNITY): Payer: Self-pay

## 2020-12-10 MED ORDER — VILAZODONE HCL 20 MG PO TABS
1.0000 | ORAL_TABLET | Freq: Every day | ORAL | 3 refills | Status: DC
  Filled 2020-12-10: qty 30, 30d supply, fill #0
  Filled 2021-01-09: qty 30, 30d supply, fill #1
  Filled 2021-02-07: qty 30, 30d supply, fill #2
  Filled 2021-03-12: qty 30, 30d supply, fill #3

## 2020-12-11 ENCOUNTER — Other Ambulatory Visit (HOSPITAL_COMMUNITY): Payer: Self-pay

## 2020-12-11 DIAGNOSIS — D2272 Melanocytic nevi of left lower limb, including hip: Secondary | ICD-10-CM | POA: Diagnosis not present

## 2020-12-12 NOTE — Progress Notes (Signed)
[name]: [phone] Subjective:   [name], am serving as a scribe for Dr. Hulan Saas. This visit occurred during the SARS-CoV-2 public health emergency.  Safety protocols were in place, including screening questions prior to the visit, additional usage of staff PPE, and extensive cleaning of exam room while observing appropriate contact time as indicated for disinfecting solutions.   I'm seeing this patient by the request  of:  Binnie Rail, MD  CC: Back pain, neck pain, shoulder pain follow-up  [id number]  [name] is a 45 y.o. female coming in with complaint of back and neck pain. OMT on 9/2/022. Patient states that she did have some bad days since last visit but today is doing ok. Feels SI joint moving on occasion.   L shoulder has improved post injection. Patient did try to do HEP.   Medications patient has been prescribed: Viibryd  Taking: Yes        Past Medical History:  Diagnosis Date   Allergy    Autoimmune disorder ([place])    Crohn's disease ([place])    Endometriosis    Hypertension    Kidney, medullary sponge    Ovarian cyst    Pre-eclampsia    Raynaud's disease    Stress incontinence in female     Allergies  Allergen Reactions   Lamisil [Terbinafine] Hives and Itching    In pill form     Review of Systems:  [name] headache, visual changes, nausea, vomiting, diarrhea, constipation, dizziness, abdominal pain, skin rash, fevers, chills, night sweats, weight loss, swollen lymph nodes, body aches, joint swelling, chest pain, shortness of breath, mood changes. POSITIVE muscle aches  Objective  Blood pressure 120/86, pulse 94, height 5' 3"  (1.6 m), weight 144 lb (65.3 kg), SpO2 97 %.   General: [name] apparent distress alert and oriented x3 mood and affect normal, dressed appropriately.  HEENT: Pupils equal, extraocular movements intact  Respiratory: Patient's  speak in full sentences and does not appear short of breath  Cardiovascular: [name] lower extremity edema, non tender, [name] erythema  Shoulder exam still has some mild limited range of motion with internal and external range of motion.  Osteopathic findings  C2 flexed rotated and side bent right C6 flexed rotated and side bent left T3 extended rotated and side bent right inhaled rib T9 extended rotated and side bent left L2 flexed rotated and side bent right Sacrum right on right       Assessment and Plan:  Frozen shoulder Mild improvement in range of motion but still has some limited range of motion with internal and external rotation.  Patient with strength is 5 out of 5.  Patient is going to continue with conservative therapy.  Discussed icing regimen and home exercises.  Follow-up again in 6 weeks.  SI (sacroiliac) joint dysfunction Overall doing relatively decent.  Would consider patient stable.  Discussed icing regimen and home exercises.  Discussed icing regimen.  Follow-up again in 6 weeks   Nonallopathic problems  Decision today to treat with OMT was based on Physical Exam  After verbal consent patient was treated with HVLA, ME, FPR techniques in cervical, rib, thoracic, lumbar, and sacral  areas  Patient tolerated the procedure well with improvement in symptoms  Patient given exercises, stretches and lifestyle modifications  See medications in patient instructions if given  Patient will follow up in 4-8 weeks     The above documentation  has been reviewed and is accurate and complete Lyndal Pulley, DO        Note: This dictation was prepared with Dragon dictation along with smaller phrase technology. Any transcriptional errors that result from this process are unintentional.

## 2020-12-13 ENCOUNTER — Ambulatory Visit: Payer: 59 | Admitting: Family Medicine

## 2020-12-13 ENCOUNTER — Other Ambulatory Visit: Payer: Self-pay

## 2020-12-13 VITALS — BP 120/86 | HR 94 | Ht 63.0 in | Wt 144.0 lb

## 2020-12-13 DIAGNOSIS — M9908 Segmental and somatic dysfunction of rib cage: Secondary | ICD-10-CM

## 2020-12-13 DIAGNOSIS — M7502 Adhesive capsulitis of left shoulder: Secondary | ICD-10-CM

## 2020-12-13 DIAGNOSIS — M9905 Segmental and somatic dysfunction of pelvic region: Secondary | ICD-10-CM | POA: Diagnosis not present

## 2020-12-13 DIAGNOSIS — M9902 Segmental and somatic dysfunction of thoracic region: Secondary | ICD-10-CM | POA: Diagnosis not present

## 2020-12-13 DIAGNOSIS — M9901 Segmental and somatic dysfunction of cervical region: Secondary | ICD-10-CM | POA: Diagnosis not present

## 2020-12-13 DIAGNOSIS — M9903 Segmental and somatic dysfunction of lumbar region: Secondary | ICD-10-CM | POA: Diagnosis not present

## 2020-12-13 DIAGNOSIS — M533 Sacrococcygeal disorders, not elsewhere classified: Secondary | ICD-10-CM

## 2020-12-13 DIAGNOSIS — M9904 Segmental and somatic dysfunction of sacral region: Secondary | ICD-10-CM | POA: Diagnosis not present

## 2020-12-13 NOTE — Assessment & Plan Note (Signed)
Mild improvement in range of motion but still has some limited range of motion with internal and external rotation.  Patient with strength is 5 out of 5.  Patient is going to continue with conservative therapy.  Discussed icing regimen and home exercises.  Follow-up again in 6 weeks.

## 2020-12-13 NOTE — Assessment & Plan Note (Signed)
Overall doing relatively decent.  Would consider patient stable.  Discussed icing regimen and home exercises.  Discussed icing regimen.  Follow-up again in 6 weeks

## 2020-12-13 NOTE — Patient Instructions (Signed)
Get back in the groove See me in 6 weeks

## 2020-12-16 DIAGNOSIS — M545 Low back pain, unspecified: Secondary | ICD-10-CM | POA: Diagnosis not present

## 2020-12-16 DIAGNOSIS — M25512 Pain in left shoulder: Secondary | ICD-10-CM | POA: Diagnosis not present

## 2020-12-16 DIAGNOSIS — M797 Fibromyalgia: Secondary | ICD-10-CM | POA: Diagnosis not present

## 2020-12-17 ENCOUNTER — Other Ambulatory Visit: Payer: Self-pay | Admitting: Family Medicine

## 2020-12-17 ENCOUNTER — Other Ambulatory Visit (HOSPITAL_COMMUNITY): Payer: Self-pay

## 2020-12-18 ENCOUNTER — Ambulatory Visit
Admission: RE | Admit: 2020-12-18 | Discharge: 2020-12-18 | Disposition: A | Discharge status: Home / Self Care | Payer: 59 | Source: Ambulatory Visit | Attending: Obstetrics & Gynecology | Admitting: Obstetrics & Gynecology

## 2020-12-18 ENCOUNTER — Other Ambulatory Visit: Payer: Self-pay

## 2020-12-18 ENCOUNTER — Other Ambulatory Visit (HOSPITAL_COMMUNITY): Payer: Self-pay

## 2020-12-18 DIAGNOSIS — Z1231 Encounter for screening mammogram for malignant neoplasm of breast: Secondary | ICD-10-CM

## 2020-12-18 MED ORDER — LEVOTHYROXINE SODIUM 50 MCG PO TABS
50.0000 ug | ORAL_TABLET | Freq: Every day | ORAL | 0 refills | Status: DC
  Filled 2020-12-18: qty 90, 90d supply, fill #0

## 2020-12-30 DIAGNOSIS — M25512 Pain in left shoulder: Secondary | ICD-10-CM | POA: Diagnosis not present

## 2020-12-30 DIAGNOSIS — M545 Low back pain, unspecified: Secondary | ICD-10-CM | POA: Diagnosis not present

## 2020-12-30 DIAGNOSIS — M797 Fibromyalgia: Secondary | ICD-10-CM | POA: Diagnosis not present

## 2021-01-09 ENCOUNTER — Other Ambulatory Visit (HOSPITAL_COMMUNITY): Payer: Self-pay

## 2021-01-15 DIAGNOSIS — M545 Low back pain, unspecified: Secondary | ICD-10-CM | POA: Diagnosis not present

## 2021-01-15 DIAGNOSIS — M25512 Pain in left shoulder: Secondary | ICD-10-CM | POA: Diagnosis not present

## 2021-01-15 DIAGNOSIS — M797 Fibromyalgia: Secondary | ICD-10-CM | POA: Diagnosis not present

## 2021-01-17 ENCOUNTER — Other Ambulatory Visit (HOSPITAL_COMMUNITY): Payer: Self-pay

## 2021-01-17 ENCOUNTER — Other Ambulatory Visit: Payer: Self-pay | Admitting: Internal Medicine

## 2021-01-17 DIAGNOSIS — Z01419 Encounter for gynecological examination (general) (routine) without abnormal findings: Secondary | ICD-10-CM | POA: Diagnosis not present

## 2021-01-17 DIAGNOSIS — N9985 Post endometrial ablation syndrome: Secondary | ICD-10-CM | POA: Diagnosis not present

## 2021-01-17 DIAGNOSIS — Z6824 Body mass index (BMI) 24.0-24.9, adult: Secondary | ICD-10-CM | POA: Diagnosis not present

## 2021-01-17 DIAGNOSIS — Z113 Encounter for screening for infections with a predominantly sexual mode of transmission: Secondary | ICD-10-CM | POA: Diagnosis not present

## 2021-01-17 DIAGNOSIS — Z9851 Tubal ligation status: Secondary | ICD-10-CM | POA: Diagnosis not present

## 2021-01-17 DIAGNOSIS — Z01411 Encounter for gynecological examination (general) (routine) with abnormal findings: Secondary | ICD-10-CM | POA: Diagnosis not present

## 2021-01-17 DIAGNOSIS — Z124 Encounter for screening for malignant neoplasm of cervix: Secondary | ICD-10-CM | POA: Diagnosis not present

## 2021-01-17 MED ORDER — TRAMADOL HCL 50 MG PO TABS
50.0000 mg | ORAL_TABLET | Freq: Two times a day (BID) | ORAL | 0 refills | Status: DC | PRN
  Filled 2021-01-17: qty 30, 15d supply, fill #0

## 2021-01-22 ENCOUNTER — Other Ambulatory Visit (HOSPITAL_COMMUNITY): Payer: Self-pay

## 2021-01-22 DIAGNOSIS — D485 Neoplasm of uncertain behavior of skin: Secondary | ICD-10-CM | POA: Diagnosis not present

## 2021-01-23 ENCOUNTER — Ambulatory Visit: Payer: 59 | Admitting: Sports Medicine

## 2021-01-23 ENCOUNTER — Other Ambulatory Visit: Payer: Self-pay

## 2021-01-23 VITALS — BP 110/60 | HR 80 | Ht 63.0 in | Wt 144.0 lb

## 2021-01-23 DIAGNOSIS — M9904 Segmental and somatic dysfunction of sacral region: Secondary | ICD-10-CM

## 2021-01-23 DIAGNOSIS — M533 Sacrococcygeal disorders, not elsewhere classified: Secondary | ICD-10-CM

## 2021-01-23 DIAGNOSIS — M9903 Segmental and somatic dysfunction of lumbar region: Secondary | ICD-10-CM

## 2021-01-23 DIAGNOSIS — M9901 Segmental and somatic dysfunction of cervical region: Secondary | ICD-10-CM

## 2021-01-23 DIAGNOSIS — M9902 Segmental and somatic dysfunction of thoracic region: Secondary | ICD-10-CM | POA: Diagnosis not present

## 2021-01-23 DIAGNOSIS — M9905 Segmental and somatic dysfunction of pelvic region: Secondary | ICD-10-CM

## 2021-01-23 NOTE — Patient Instructions (Addendum)
Good to see you   Follow up in 6 weeks for repeat OMT

## 2021-01-23 NOTE — Progress Notes (Signed)
[name] D.[place] Phone: [phone]   Assessment and Plan:     1. SI (sacroiliac) joint dysfunction 2. Somatic dysfunction of cervical region 3. Somatic dysfunction of thoracic region 4. Somatic dysfunction of lumbar region 5. Somatic dysfunction of pelvic region 6. Somatic dysfunction of sacral region -Chronic with exacerbation, subsequent visit - Recurrence of typical musculoskeletal complaints with worst being right SI joint - Continue physical therapy - Patient has received significant relief with OMT in the past.  Elects for repeat OMT today.  Tolerated well per note below. - Decision today to treat with OMT was based on Physical Exam   After verbal consent patient was treated with HVLA (high velocity low amplitude), ME (muscle energy), FPR (flex positional release), ST (soft tissue), PC/PD (Pelvic Compression/ Pelvic Decompression) techniques in cervical, sacrum, thoracic, lumbar, and pelvic areas. Patient tolerated the procedure well with improvement in symptoms.  Patient educated on potential side effects of soreness and recommended to rest, hydrate, and use Tylenol as needed for pain control.   Pertinent previous records reviewed include none   Follow Up: 4 to 6 weeks for repeat OMT   Subjective:   I, [name], am serving as a scribe for [name]  Chief Complaint: Back, Neck, and shoulder pain   HPI:   01/23/21 Patient is a 45 year old female presenting with neck, back, and shoulder pain. Patent was last seen by Dr. Tamala [name] on 12/13/20 for this reason and had OMT. Today patient states that she has chronic issues with her SI joints and the low right sided SI into the glute flare today.   Relevant Historical Information: Hypertension, fibromyalgia, acquired hypothyroidism  Additional pertinent review of systems negative.    Current Outpatient Medications  Medication Sig Dispense  Refill   acetaminophen (TYLENOL) 500 MG tablet Take 1,000 mg by mouth every 6 (six) hours as needed for mild pain or headache.     Acetaminophen-Caffeine (EXCEDRIN TENSION HEADACHE) 500-65 MG TABS Take 2 tablets by mouth daily as needed (headache).     amLODipine (NORVASC) 5 MG tablet Take 1 tablet (5 mg total) by mouth daily. 90 tablet 1   Cholecalciferol (VITAMIN D3) 2000 units TABS Take 2,000 Units by mouth daily.     docusate sodium (COLACE) 100 MG capsule Take 100 mg by mouth daily.     fluticasone (FLONASE) 50 MCG/ACT nasal spray Place 2 sprays into both nostrils daily. 16 g 6   levothyroxine (SYNTHROID) 50 MCG tablet Take 1 tablet (50 mcg total) by mouth daily. 90 tablet 0   multivitamin-lutein (OCUVITE-LUTEIN) CAPS capsule Take 1 capsule by mouth daily.     nitrofurantoin (MACRODANTIN) 50 MG capsule TAKE AFTER INTERCOURSE AS DIRECTED 45 capsule 0   omeprazole (PRILOSEC) 20 MG capsule Take 20 mg by mouth daily.     Prasterone, DHEA, (DHEA ADVANCED FORMULA PO) Take 1 tablet by mouth daily.      Propylene Glycol (SYSTANE COMPLETE OP) Place 1 drop into both eyes daily.     traMADol (ULTRAM) 50 MG tablet Take 1 tablet (50 mg total) by mouth every 12 (twelve) hours as needed for moderate pain 30 tablet 0   Vilazodone HCl (VIIBRYD) 20 MG TABS Take 1 tablet (20 mg total) by mouth daily. 30 tablet 3   montelukast (SINGULAIR) 10 MG tablet TAKE 1 TABLET (10 MG TOTAL) BY MOUTH AT BEDTIME. 90 tablet 1   No current facility-administered medications for  this visit.      Objective:     Vitals:   01/23/21 1134  BP: 110/60  Pulse: 80  SpO2: 99%  Weight: 144 lb (65.3 kg)  Height: 5' 3"  (1.6 m)      Body mass index is 25.51 kg/m.    Physical Exam:     General: Well-appearing, cooperative, sitting comfortably in no acute distress.   OMT Physical Exam:  ASIS Compression Test: Positive Right Cervical: TTP paraspinal, C3-6 RL SL Sacrum: Positive sphinx, TTP bilateral sacral base Thoracic:  TTP paraspinal, T5-8 RLSR Lumbar: TTP paraspinal, L1-3 RRSL Pelvis: Right anterior innominate  Electronically signed by:  [name] D.[facility] 12:17 PM 01/23/21

## 2021-01-24 ENCOUNTER — Ambulatory Visit: Payer: 59 | Admitting: Family Medicine

## 2021-01-27 DIAGNOSIS — M797 Fibromyalgia: Secondary | ICD-10-CM | POA: Diagnosis not present

## 2021-01-27 DIAGNOSIS — M25512 Pain in left shoulder: Secondary | ICD-10-CM | POA: Diagnosis not present

## 2021-01-27 DIAGNOSIS — M545 Low back pain, unspecified: Secondary | ICD-10-CM | POA: Diagnosis not present

## 2021-02-03 DIAGNOSIS — M545 Low back pain, unspecified: Secondary | ICD-10-CM | POA: Diagnosis not present

## 2021-02-03 DIAGNOSIS — M25512 Pain in left shoulder: Secondary | ICD-10-CM | POA: Diagnosis not present

## 2021-02-03 DIAGNOSIS — M797 Fibromyalgia: Secondary | ICD-10-CM | POA: Diagnosis not present

## 2021-02-07 ENCOUNTER — Other Ambulatory Visit (HOSPITAL_COMMUNITY): Payer: Self-pay

## 2021-02-10 DIAGNOSIS — M545 Low back pain, unspecified: Secondary | ICD-10-CM | POA: Diagnosis not present

## 2021-02-10 DIAGNOSIS — M797 Fibromyalgia: Secondary | ICD-10-CM | POA: Diagnosis not present

## 2021-02-10 DIAGNOSIS — M25512 Pain in left shoulder: Secondary | ICD-10-CM | POA: Diagnosis not present

## 2021-02-11 DIAGNOSIS — D485 Neoplasm of uncertain behavior of skin: Secondary | ICD-10-CM | POA: Diagnosis not present

## 2021-02-17 DIAGNOSIS — M545 Low back pain, unspecified: Secondary | ICD-10-CM | POA: Diagnosis not present

## 2021-02-17 DIAGNOSIS — M25512 Pain in left shoulder: Secondary | ICD-10-CM | POA: Diagnosis not present

## 2021-02-17 DIAGNOSIS — M797 Fibromyalgia: Secondary | ICD-10-CM | POA: Diagnosis not present

## 2021-03-10 DIAGNOSIS — R300 Dysuria: Secondary | ICD-10-CM | POA: Diagnosis not present

## 2021-03-10 DIAGNOSIS — N3001 Acute cystitis with hematuria: Secondary | ICD-10-CM | POA: Diagnosis not present

## 2021-03-12 DIAGNOSIS — M797 Fibromyalgia: Secondary | ICD-10-CM | POA: Diagnosis not present

## 2021-03-12 DIAGNOSIS — M545 Low back pain, unspecified: Secondary | ICD-10-CM | POA: Diagnosis not present

## 2021-03-12 DIAGNOSIS — M25512 Pain in left shoulder: Secondary | ICD-10-CM | POA: Diagnosis not present

## 2021-03-13 ENCOUNTER — Other Ambulatory Visit (HOSPITAL_COMMUNITY): Payer: Self-pay

## 2021-03-24 DIAGNOSIS — M545 Low back pain, unspecified: Secondary | ICD-10-CM | POA: Diagnosis not present

## 2021-03-24 DIAGNOSIS — H9312 Tinnitus, left ear: Secondary | ICD-10-CM | POA: Diagnosis not present

## 2021-03-24 DIAGNOSIS — H9042 Sensorineural hearing loss, unilateral, left ear, with unrestricted hearing on the contralateral side: Secondary | ICD-10-CM | POA: Diagnosis not present

## 2021-03-24 DIAGNOSIS — M25512 Pain in left shoulder: Secondary | ICD-10-CM | POA: Diagnosis not present

## 2021-03-24 DIAGNOSIS — M797 Fibromyalgia: Secondary | ICD-10-CM | POA: Diagnosis not present

## 2021-03-25 ENCOUNTER — Other Ambulatory Visit: Payer: Self-pay | Admitting: Family Medicine

## 2021-03-25 ENCOUNTER — Other Ambulatory Visit (HOSPITAL_COMMUNITY): Payer: Self-pay

## 2021-03-25 ENCOUNTER — Other Ambulatory Visit: Payer: Self-pay | Admitting: Internal Medicine

## 2021-03-25 MED ORDER — LEVOTHYROXINE SODIUM 50 MCG PO TABS
50.0000 ug | ORAL_TABLET | Freq: Every day | ORAL | 0 refills | Status: DC
  Filled 2021-03-25: qty 90, 90d supply, fill #0

## 2021-03-25 MED ORDER — AMLODIPINE BESYLATE 5 MG PO TABS
5.0000 mg | ORAL_TABLET | Freq: Every day | ORAL | 1 refills | Status: DC
  Filled 2021-03-25: qty 90, 90d supply, fill #0
  Filled 2021-06-23: qty 90, 90d supply, fill #1

## 2021-03-26 ENCOUNTER — Other Ambulatory Visit (HOSPITAL_COMMUNITY): Payer: Self-pay | Admitting: Otolaryngology

## 2021-03-26 ENCOUNTER — Other Ambulatory Visit: Payer: Self-pay | Admitting: Otolaryngology

## 2021-03-26 DIAGNOSIS — H9042 Sensorineural hearing loss, unilateral, left ear, with unrestricted hearing on the contralateral side: Secondary | ICD-10-CM

## 2021-04-04 ENCOUNTER — Other Ambulatory Visit: Payer: Self-pay

## 2021-04-04 ENCOUNTER — Ambulatory Visit (HOSPITAL_COMMUNITY)
Admission: RE | Admit: 2021-04-04 | Discharge: 2021-04-04 | Disposition: A | Discharge status: Home / Self Care | Payer: 59 | Source: Ambulatory Visit | Attending: Otolaryngology | Admitting: Otolaryngology

## 2021-04-04 DIAGNOSIS — H9042 Sensorineural hearing loss, unilateral, left ear, with unrestricted hearing on the contralateral side: Secondary | ICD-10-CM | POA: Insufficient documentation

## 2021-04-04 DIAGNOSIS — H9190 Unspecified hearing loss, unspecified ear: Secondary | ICD-10-CM | POA: Diagnosis not present

## 2021-04-04 MED ORDER — GADOBUTROL 1 MMOL/ML IV SOLN
6.0000 mL | Freq: Once | INTRAVENOUS | Status: AC | PRN
  Administered 2021-04-04: 6 mL via INTRAVENOUS

## 2021-04-04 NOTE — Progress Notes (Signed)
[name] Phone: [phone] Subjective:   [name], am serving as a scribe for Dr. Hulan Saas.This visit occurred during the SARS-CoV-2 public health emergency.  Safety protocols were in place, including screening questions prior to the visit, additional usage of staff PPE, and extensive cleaning of exam room while observing appropriate contact time as indicated for disinfecting solutions.  I'm seeing this patient by the request  of:  Binnie Rail, MD  CC: Neck and back pain with shoulder pain  [id number]  [name] is a 46 y.o. female coming in with complaint of back and neck pain. OMT on 01/23/2021 with Dr. Glennon Mac. Back pain is worse today than last visit. On Saturday, she leaned over and picked up an item and her back spasmed. Used IBU, exercises, ice. Pain is improving today. Pain is 4/10.   Patient states she would like a shoulder injection today. Pain with flexion and IR/ER.  Patient states overall has made some improvement but feels like if she could potentially have an injection she could be able to use it on a more regular basis.  Tries to avoid anti-inflammatories secondary to history of kidney difficulties.  Medications patient has been prescribed: Synthroid, Viibryd  Taking: Yes         Reviewed prior external information including notes and imaging from previsou exam, outside providers and external EMR if available.   As well as notes that were available from care everywhere and other healthcare systems.  Past medical history, social, surgical and family history all reviewed in electronic medical record.  No pertanent information unless stated regarding to the chief complaint.   Past Medical History:  Diagnosis Date   Allergy    Autoimmune disorder ([place])    Crohn's disease ([place])    Endometriosis    Hypertension    Kidney, medullary sponge    Ovarian cyst     Pre-eclampsia    Raynaud's disease    Stress incontinence in female     Allergies  Allergen Reactions   Lamisil [Terbinafine] Hives and Itching    In pill form     Review of Systems:  No headache, visual changes, nausea, vomiting, diarrhea, constipation, dizziness, abdominal pain, skin rash, fevers, chills, night sweats, weight loss, swollen lymph nodes, body aches, joint swelling, chest pain, shortness of breath, mood changes. POSITIVE muscle aches  Objective  Blood pressure 128/82, pulse 93, height 5' 3"  (1.6 m), weight 144 lb (65.3 kg), SpO2 98 %.   General: No apparent distress alert and oriented x3 mood and affect normal, dressed appropriately.  HEENT: Pupils equal, extraocular movements intact  Respiratory: Patient's speak in full sentences and does not appear short of breath  Cardiovascular: No lower extremity edema, non tender, no erythema  Left shoulder exam does have some limited range of motion especially with internal and external range of motion.  Patient does have tenderness to palpation minorly as well. Back exam still has some tenderness to palpation in the paraspinal musculature.  Patient does have some tightness with FABER test.  Neck exam also shows some tightness on the right side noted. Osteopathic findings  C2 flexed rotated and side bent right C6 flexed rotated and side bent left T3 extended rotated and side bent right inhaled rib T9 extended rotated and side bent left L2 flexed rotated and side bent right Sacrum right on right  Procedure: Real-time Ultrasound Guided Injection of left glenohumeral joint Device:  GE Logiq E  Ultrasound guided injection is preferred based studies that show increased duration, increased effect, greater accuracy, decreased procedural pain, increased response rate with ultrasound guided versus blind injection.  Verbal informed consent obtained.  Time-out conducted.  Noted no overlying erythema, induration, or other signs of local  infection.  Skin prepped in a sterile fashion.  Local anesthesia: Topical Ethyl chloride.  With sterile technique and under real time ultrasound guidance:  Joint visualized.  21g 2 inch needle inserted posterior approach. Pictures taken for needle placement. Patient did have injection of 2 cc of 0.5% Marcaine, and 1cc of Kenalog 40 mg/dL. Completed without difficulty  Pain immediately resolved suggesting accurate placement of the medication.  Advised to call if fevers/chills, erythema, induration, drainage, or persistent bleeding.  Images permanently stored and available for review in the ultrasound unit.  Impression: Technically successful ultrasound guided injection.     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  Has had some tightness recently.  Has been sometime since we have done manipulation.  Discussed the core strengthening and hip abductor strengthening.  Follow-up again in 6 to 8 weeks  Frozen shoulder Injection given today, tolerated the procedure well, discussed icing regimen and home exercises.  Hopefully patient will make some progress with range of motion.  We will continue to monitor.  Follow-up again in 6 to 8 weeks otherwise.    Nonallopathic problems  Decision today to treat with OMT was based on Physical Exam  After verbal consent patient was treated with HVLA, ME, FPR techniques in cervical, rib, thoracic, lumbar, and sacral  areas  Patient tolerated the procedure well with improvement in symptoms  Patient given exercises, stretches and lifestyle modifications  See medications in patient instructions if given  Patient will follow up in 4-8 weeks     The above documentation has been reviewed and is accurate and complete Lyndal Pulley, DO        Note: This dictation was prepared with Dragon dictation along with smaller phrase technology. Any transcriptional errors that result from this process are  unintentional.

## 2021-04-06 ENCOUNTER — Other Ambulatory Visit: Payer: Self-pay | Admitting: Family Medicine

## 2021-04-07 ENCOUNTER — Encounter: Payer: Self-pay | Admitting: Family Medicine

## 2021-04-07 ENCOUNTER — Other Ambulatory Visit: Payer: Self-pay

## 2021-04-07 ENCOUNTER — Other Ambulatory Visit (HOSPITAL_COMMUNITY): Payer: Self-pay

## 2021-04-07 ENCOUNTER — Ambulatory Visit: Payer: Self-pay

## 2021-04-07 ENCOUNTER — Ambulatory Visit: Payer: 59 | Admitting: Family Medicine

## 2021-04-07 VITALS — BP 128/82 | HR 93 | Ht 63.0 in | Wt 144.0 lb

## 2021-04-07 DIAGNOSIS — M9901 Segmental and somatic dysfunction of cervical region: Secondary | ICD-10-CM | POA: Diagnosis not present

## 2021-04-07 DIAGNOSIS — M9903 Segmental and somatic dysfunction of lumbar region: Secondary | ICD-10-CM

## 2021-04-07 DIAGNOSIS — M7502 Adhesive capsulitis of left shoulder: Secondary | ICD-10-CM

## 2021-04-07 DIAGNOSIS — M9908 Segmental and somatic dysfunction of rib cage: Secondary | ICD-10-CM | POA: Diagnosis not present

## 2021-04-07 DIAGNOSIS — M533 Sacrococcygeal disorders, not elsewhere classified: Secondary | ICD-10-CM | POA: Diagnosis not present

## 2021-04-07 DIAGNOSIS — M25512 Pain in left shoulder: Secondary | ICD-10-CM

## 2021-04-07 DIAGNOSIS — M9904 Segmental and somatic dysfunction of sacral region: Secondary | ICD-10-CM

## 2021-04-07 DIAGNOSIS — G8929 Other chronic pain: Secondary | ICD-10-CM

## 2021-04-07 DIAGNOSIS — M9902 Segmental and somatic dysfunction of thoracic region: Secondary | ICD-10-CM

## 2021-04-07 MED ORDER — VILAZODONE HCL 20 MG PO TABS
1.0000 | ORAL_TABLET | Freq: Every day | ORAL | 3 refills | Status: DC
  Filled 2021-04-07: qty 30, 30d supply, fill #0

## 2021-04-07 MED ORDER — VILAZODONE HCL 20 MG PO TABS
1.0000 | ORAL_TABLET | Freq: Every day | ORAL | 0 refills | Status: DC
  Filled 2021-04-07 – 2021-04-08 (×2): qty 90, 90d supply, fill #0

## 2021-04-07 NOTE — Patient Instructions (Signed)
Injected shoulder See me in 7-8 weeks

## 2021-04-08 ENCOUNTER — Other Ambulatory Visit (HOSPITAL_COMMUNITY): Payer: Self-pay

## 2021-04-08 NOTE — Assessment & Plan Note (Signed)
Injection given today, tolerated the procedure well, discussed icing regimen and home exercises.  Hopefully patient will make some progress with range of motion.  We will continue to monitor.  Follow-up again in 6 to 8 weeks otherwise.

## 2021-04-08 NOTE — Assessment & Plan Note (Signed)
Chronic problem with exacerbation.  Has had some tightness recently.  Has been sometime since we have done manipulation.  Discussed the core strengthening and hip abductor strengthening.  Follow-up again in 6 to 8 weeks

## 2021-04-09 DIAGNOSIS — M797 Fibromyalgia: Secondary | ICD-10-CM | POA: Diagnosis not present

## 2021-04-09 DIAGNOSIS — M25512 Pain in left shoulder: Secondary | ICD-10-CM | POA: Diagnosis not present

## 2021-04-09 DIAGNOSIS — M545 Low back pain, unspecified: Secondary | ICD-10-CM | POA: Diagnosis not present

## 2021-04-11 ENCOUNTER — Ambulatory Visit: Payer: 59 | Admitting: Internal Medicine

## 2021-04-14 ENCOUNTER — Encounter: Payer: Self-pay | Admitting: Family Medicine

## 2021-04-17 ENCOUNTER — Encounter: Payer: Self-pay | Admitting: Family Medicine

## 2021-04-24 NOTE — Patient Instructions (Addendum)
° ° ° °  Medications changes include :  none    Your prescription(s) have been sent to your pharmacy.    A referral was ordered for pain clinic.     Someone from that office will call you to schedule an appointment.    Return in about 6 months (around 10/23/2021) for CPE.

## 2021-04-24 NOTE — Progress Notes (Signed)
Subjective:    Patient ID: [name], female    DOB: 1975-04-01, 46 y.o.   MRN: [id number]  This visit occurred during the SARS-CoV-2 public health emergency.  Safety protocols were in place, including screening questions prior to the visit, additional usage of staff PPE, and extensive cleaning of exam room while observing appropriate contact time as indicated for disinfecting solutions.     HPI The patient is here for follow up of their chronic medical problems, including htn, hypothyroidism GERD, prediabetes, fibromyalgia, recurrent uti, chronic pain syndrome  Had one uti since she was here last - forgot to bring her medication with her.    Medications and allergies reviewed with patient and updated if appropriate.  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   Chronic pain syndrome 10/04/2020   Fibromyalgia 03/12/2020   Piriformis syndrome, left 11/27/2019   Tremor 03/16/2019   Prediabetes 02/09/2019   Hypothyroidism (acquired) 08/02/2018   Nonallopathic lesion of rib cage 10/08/2017   Antalgic gait 04/19/2017   Polyarthralgia 02/26/2016   GERD (gastroesophageal reflux disease) 02/17/2016   Cervical lymphadenopathy 01/21/2016   Nonallopathic lesion of thoracic region 08/23/2015   Lumbar radiculopathy 08/02/2015   SI (sacroiliac) joint dysfunction 03/20/2015   Scapular dysfunction 03/20/2015   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     Acetaminophen-Caffeine (EXCEDRIN TENSION HEADACHE) 500-65 MG TABS Take 2 tablets by mouth daily as needed (headache).     amLODipine (NORVASC) 5 MG tablet Take 1 tablet (5 mg total) by mouth daily. 90 tablet 1   Cholecalciferol (VITAMIN D3) 2000 units TABS Take 2,000 Units by mouth daily.     docusate sodium (COLACE) 100 MG capsule Take 100 mg by mouth daily.     fluticasone (FLONASE) 50 MCG/ACT nasal spray Place 2 sprays into both nostrils daily. 16 g 6   levothyroxine (SYNTHROID) 50 MCG tablet Take 1 tablet (50 mcg total) by mouth daily. 90 tablet 0   multivitamin-lutein (OCUVITE-LUTEIN) CAPS capsule Take 1 capsule by mouth daily.     nitrofurantoin (MACRODANTIN) 50 MG capsule TAKE AFTER INTERCOURSE AS DIRECTED 45 capsule 0   omeprazole (PRILOSEC) 20 MG capsule Take 20 mg by mouth daily.     Prasterone, DHEA, (DHEA ADVANCED FORMULA PO) Take 1 tablet by mouth daily.      Propylene Glycol (SYSTANE COMPLETE OP) Place 1 drop into both eyes daily.     traMADol (ULTRAM) 50 MG tablet Take 1 tablet (50 mg total) by mouth every 12 (twelve) hours as needed for moderate pain 30 tablet 0   Vilazodone HCl (VIIBRYD) 20 MG TABS Take 1 tablet (20 mg total) by mouth daily. 90 tablet 0   montelukast (SINGULAIR) 10 MG tablet TAKE 1 TABLET (10 MG TOTAL) BY MOUTH AT BEDTIME. 90 tablet 1   No current facility-administered medications on file prior to visit.    Past Medical History:  Diagnosis Date  Allergy    Autoimmune disorder ([place])    Crohn's disease ([place])    Endometriosis    Hypertension    Kidney, medullary sponge    Ovarian cyst    Pre-eclampsia    Raynaud's disease    Stress incontinence in female     Past Surgical History:  Procedure Laterality Date   CHOLECYSTECTOMY     DILATION AND CURETTAGE OF UTERUS     ENDOMETRIAL ABLATION     NOVASURE ABLATION     TUBAL LIGATION      Social History   Socioeconomic History   Marital  status: Married    Spouse name: Not on file   Number of children: 3   Years of education: Not on file   Highest education level: Not on file  Occupational History   Occupation: Programmer, multimedia: [employer]  Tobacco Use   Smoking status: Never   Smokeless tobacco: Never  Vaping Use   Vaping Use: Never used  Substance and Sexual Activity   Alcohol use: Yes   Drug use: No   Sexual activity: Yes  Other Topics Concern   Not on file  Social History Narrative   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, OR   Social Determinants of Health   Financial Resource Strain: Not on file  Food Insecurity: Not on file  Transportation Needs: Not on file  Physical Activity: Not on file  Stress: Not on file  Social Connections: Not on file    Family History  Problem Relation Age of Onset   Breast cancer Mother    Hypertension Father    Hyperlipidemia Father    Other Brother        lyme-chronic   Stroke Maternal Grandmother    Alcohol abuse Paternal Grandmother    Heart disease Paternal Grandfather    Colon cancer Neg Hx    Esophageal cancer Neg Hx    Rectal cancer Neg Hx     Review of Systems  Constitutional:  Negative for chills and fever.  Respiratory:  Negative for cough, shortness of breath and wheezing.   Cardiovascular:  Positive for leg swelling (occ). Negative for chest pain and palpitations.  Musculoskeletal:  Positive for myalgias.  Neurological:  Positive for headaches (occ). Negative for light-headedness.      Objective:   Vitals:   04/25/21 1339  BP: 128/74  Pulse: 87  Temp: 97.9 F (36.6 C)  SpO2: 98%   BP Readings from Last 3 Encounters:  04/25/21 128/74  04/07/21 128/82  01/23/21 110/60   Wt Readings from Last 3 Encounters:  04/25/21 142 lb 6.4 oz (64.6 kg)  04/07/21 144 lb (65.3 kg)  01/23/21 144 lb (65.3 kg)   Body mass index is 25.23 kg/m.   Physical Exam    Constitutional: Appears well-developed and well-nourished. No distress.  HENT:   Head: Normocephalic and atraumatic.  Neck: Neck supple. No tracheal deviation present. No thyromegaly present.  No cervical lymphadenopathy Cardiovascular: Normal rate, regular rhythm and normal heart sounds.   No murmur heard. No carotid bruit .  No edema Pulmonary/Chest: Effort normal and breath sounds normal. No respiratory distress. No has no wheezes. No rales.  Skin: Skin is warm and dry. Not diaphoretic.  Psychiatric: Normal mood and affect. Behavior is normal.      Assessment & Plan:    See Problem List for Assessment and Plan of chronic medical problems.

## 2021-04-25 ENCOUNTER — Encounter: Payer: Self-pay | Admitting: Internal Medicine

## 2021-04-25 ENCOUNTER — Other Ambulatory Visit: Payer: Self-pay

## 2021-04-25 ENCOUNTER — Ambulatory Visit: Payer: 59 | Admitting: Internal Medicine

## 2021-04-25 VITALS — BP 128/74 | HR 87 | Temp 97.9°F | Ht 63.0 in | Wt 142.4 lb

## 2021-04-25 DIAGNOSIS — H9191 Unspecified hearing loss, right ear: Secondary | ICD-10-CM | POA: Diagnosis not present

## 2021-04-25 DIAGNOSIS — N390 Urinary tract infection, site not specified: Secondary | ICD-10-CM | POA: Diagnosis not present

## 2021-04-25 DIAGNOSIS — I10 Essential (primary) hypertension: Secondary | ICD-10-CM | POA: Diagnosis not present

## 2021-04-25 DIAGNOSIS — G894 Chronic pain syndrome: Secondary | ICD-10-CM | POA: Diagnosis not present

## 2021-04-25 DIAGNOSIS — E039 Hypothyroidism, unspecified: Secondary | ICD-10-CM

## 2021-04-25 DIAGNOSIS — K219 Gastro-esophageal reflux disease without esophagitis: Secondary | ICD-10-CM | POA: Diagnosis not present

## 2021-04-25 DIAGNOSIS — R7303 Prediabetes: Secondary | ICD-10-CM | POA: Diagnosis not present

## 2021-04-25 DIAGNOSIS — M797 Fibromyalgia: Secondary | ICD-10-CM

## 2021-04-25 DIAGNOSIS — H9190 Unspecified hearing loss, unspecified ear: Secondary | ICD-10-CM | POA: Insufficient documentation

## 2021-04-25 NOTE — Assessment & Plan Note (Signed)
Chronic GERD controlled Continue omeprazole 20 mg daily

## 2021-04-25 NOTE — Assessment & Plan Note (Addendum)
Chronic Has medullary sponge kidney disease No symptoms of UTI currently -did have one UTI since being here b/c she forgot to bring her nitrofurantoin with her on vacation Continue nitrofurantoin 50 mg after intercourse for prevention

## 2021-04-25 NOTE — Assessment & Plan Note (Signed)
Chronic  Clinically euthyroid Currently taking levothyroxine 50 mcg daily Managed by sports medicine

## 2021-04-25 NOTE — Assessment & Plan Note (Signed)
Chronic Check a1c Low sugar / carb diet Stressed regular exercise  

## 2021-04-25 NOTE — Assessment & Plan Note (Signed)
Right ear only, MRI neg

## 2021-04-25 NOTE — Assessment & Plan Note (Addendum)
Chronic Fibromyalgia, arthralgias, back pain Having flare of fibromyalgia now Taking Viibryd 20 mg daily prescribed by Dr. Tamala Julian Continue tramadol 50 mg twice daily as needed for moderate pain, which she does not take often Try to work on sleep and decreasing stress Continue regular exercise

## 2021-04-25 NOTE — Assessment & Plan Note (Signed)
Chronic Blood pressure well controlled CMP Continue amlodipine 5 mg daily

## 2021-04-30 ENCOUNTER — Encounter: Payer: Self-pay | Admitting: Physical Medicine and Rehabilitation

## 2021-05-08 ENCOUNTER — Other Ambulatory Visit (HOSPITAL_COMMUNITY): Payer: Self-pay

## 2021-05-13 DIAGNOSIS — D485 Neoplasm of uncertain behavior of skin: Secondary | ICD-10-CM | POA: Diagnosis not present

## 2021-05-13 DIAGNOSIS — L821 Other seborrheic keratosis: Secondary | ICD-10-CM | POA: Diagnosis not present

## 2021-05-29 NOTE — Progress Notes (Signed)
?[name] D.O. ?[facility] ?[place] ?Phone: [phone] ?Subjective:   ?I, [name], am serving as a scribe for Dr. Hulan Saas. ? ?This visit occurred during the SARS-CoV-2 public health emergency.  Safety protocols were in place, including screening questions prior to the visit, additional usage of staff PPE, and extensive cleaning of exam room while observing appropriate contact time as indicated for disinfecting solutions.  ? ? ?I'm seeing this patient by the request  of:  Binnie Rail, MD ? ?CC: Back pain follow-up ? ?BPZ:WCHENIDPOE  ?[name] is a 46 y.o. female coming in with complaint of back and neck pain. OMT on 04/07/2021. Also seen for frozen shoulder. Patient states that her pain comes and goes. Had a flare 2 weeks ago alternating from L to R.  ? ?Shoulder injection helped with her pain.  ? ?Medications patient has been prescribed: Viibryd and synthroid ? ?Taking: ? ? ?  ? ? ? ? ?Reviewed prior external information including notes and imaging from previsou exam, outside providers and external EMR if available.  ? ?As well as notes that were available from care everywhere and other healthcare systems. ? ?Past medical history, social, surgical and family history all reviewed in electronic medical record.  No pertanent information unless stated regarding to the chief complaint.  ? ?Past Medical History:  ?Diagnosis Date  ? Allergy   ? Autoimmune disorder ([place])   ? Crohn's disease ([place])   ? Endometriosis   ? Hypertension   ? Kidney, medullary sponge   ? Ovarian cyst   ? Pre-eclampsia   ? Raynaud's disease   ? Stress incontinence in female   ?  ?Allergies  ?Allergen Reactions  ? Lamisil [Terbinafine] Hives and Itching  ?  In pill form  ? ? ? ?Review of Systems: ? No headache, visual changes, nausea, vomiting, diarrhea, constipation, dizziness, abdominal pain, skin rash, fevers, chills, night sweats, weight loss, swollen lymph nodes, body  aches, joint swelling, chest pain, shortness of breath, mood changes. POSITIVE muscle aches ? ?Objective  ?Blood pressure 120/74, pulse 85, height 5' 3"  (1.6 m), weight 141 lb (64 kg), SpO2 97 %. ?  ?General: No apparent distress alert and oriented x3 mood and affect normal, dressed appropriately.  ?HEENT: Pupils equal, extraocular movements intact  ?Respiratory: Patient's speak in full sentences and does not appear short of breath  ?Cardiovascular: No lower extremity edema, non tender, no erythema  ?Increased tenderness over the sacroiliac joint bilaterally right greater than left.  Patient does have some worsening pain with extension of the back.  Patient does have tightness with Corky Sox right greater than left. ? ?Osteopathic findings ? ?C2 flexed rotated and side bent right ?T9 extended rotated and side bent left ?L3 flexed rotated and side bent right ?Sacrum right on right ?Pelvic shear left ? ?  ?Assessment and Plan: ? ? ?SI (sacroiliac) joint dysfunction ?Chronic problem with exacerbation.  Patient does have some increasing in discomfort and pain in the MRI area.  Patient has responded well though to osteopathic manipulation.  Discussed icing regimen and home exercises, discussed which activities to do and which ones to avoid, increase activity slowly.  Follow-up again in 6 to 12 weeks.  ? ? ?Nonallopathic problems ? ?Decision today to treat with OMT was based on Physical Exam ? ?After verbal consent patient was treated with HVLA, ME, FPR techniques in cervical, pelvis thoracic, lumbar, and sacral  areas ? ?Patient tolerated the procedure  well with improvement in symptoms ? ?Patient given exercises, stretches and lifestyle modifications ? ?See medications in patient instructions if given ? ?Patient will follow up in 4-8 weeks ? ?  ? ? ?The above documentation has been reviewed and is accurate and complete Lyndal Pulley, DO ? ? ? ?  ? ? Note: This dictation was prepared with Dragon dictation along with smaller  phrase technology. Any transcriptional errors that result from this process are unintentional.    ?  ?  ? ?

## 2021-05-30 ENCOUNTER — Ambulatory Visit (INDEPENDENT_AMBULATORY_CARE_PROVIDER_SITE_OTHER): Payer: 59 | Admitting: Family Medicine

## 2021-05-30 ENCOUNTER — Other Ambulatory Visit: Payer: Self-pay

## 2021-05-30 ENCOUNTER — Encounter: Payer: Self-pay | Admitting: Family Medicine

## 2021-05-30 VITALS — BP 120/74 | HR 85 | Ht 63.0 in | Wt 141.0 lb

## 2021-05-30 DIAGNOSIS — M9904 Segmental and somatic dysfunction of sacral region: Secondary | ICD-10-CM | POA: Diagnosis not present

## 2021-05-30 DIAGNOSIS — M9902 Segmental and somatic dysfunction of thoracic region: Secondary | ICD-10-CM

## 2021-05-30 DIAGNOSIS — M9905 Segmental and somatic dysfunction of pelvic region: Secondary | ICD-10-CM | POA: Diagnosis not present

## 2021-05-30 DIAGNOSIS — M9901 Segmental and somatic dysfunction of cervical region: Secondary | ICD-10-CM

## 2021-05-30 DIAGNOSIS — M9903 Segmental and somatic dysfunction of lumbar region: Secondary | ICD-10-CM

## 2021-05-30 DIAGNOSIS — M533 Sacrococcygeal disorders, not elsewhere classified: Secondary | ICD-10-CM | POA: Diagnosis not present

## 2021-05-30 NOTE — Assessment & Plan Note (Signed)
Chronic problem with exacerbation.  Patient does have some increasing in discomfort and pain in the MRI area.  Patient has responded well though to osteopathic manipulation.  Discussed icing regimen and home exercises, discussed which activities to do and which ones to avoid, increase activity slowly.  Follow-up again in 6 to 12 weeks. ?

## 2021-05-30 NOTE — Patient Instructions (Signed)
See me again in 6-8 weeks ?

## 2021-06-02 ENCOUNTER — Other Ambulatory Visit: Payer: Self-pay

## 2021-06-02 ENCOUNTER — Encounter: Payer: Self-pay | Admitting: Physical Medicine and Rehabilitation

## 2021-06-02 ENCOUNTER — Encounter: Payer: 59 | Attending: Physical Medicine and Rehabilitation | Admitting: Physical Medicine and Rehabilitation

## 2021-06-02 VITALS — BP 125/88 | HR 83 | Temp 98.1°F | Ht 63.0 in | Wt 139.8 lb

## 2021-06-02 DIAGNOSIS — G894 Chronic pain syndrome: Secondary | ICD-10-CM | POA: Insufficient documentation

## 2021-06-02 DIAGNOSIS — M797 Fibromyalgia: Secondary | ICD-10-CM | POA: Diagnosis not present

## 2021-06-02 DIAGNOSIS — M7918 Myalgia, other site: Secondary | ICD-10-CM | POA: Insufficient documentation

## 2021-06-02 DIAGNOSIS — E039 Hypothyroidism, unspecified: Secondary | ICD-10-CM | POA: Diagnosis not present

## 2021-06-02 MED ORDER — NALTREXONE HCL 50 MG PO TABS: 25.0000 mg | ORAL_TABLET | Freq: Every day | ORAL | 0 refills | Status: DC

## 2021-06-02 NOTE — Progress Notes (Signed)
? ?Subjective:  ? ? Patient ID: [name], female    DOB: 12-31-1975, 46 y.o.   MRN: [id number] ? ?HPI ?Pt is a 46 yr old female with dx of Fibromyalgia, HTN, and hypothyroidism;  Has medullar sponge kidney.  ?Here for evaluation of fibromyalgia- got dx 10/21- but Sx's since 2017.  ? ?Has a lot of tender points- upper back/between shoulder blades and neck, shoulders as well as low back.  ?Aching pain;  ?Feeling of flu like Sx's- tired/fatigue, aching- like getting sick.  ? ?Occ Migraines; more constipation than anything- occ will swing into IBS-diarrhea Sx's.  ? ?Renal says can take ibuprofen intermittently, but not daily.  ? ?Has been on Vibryd for a long time- was on Effexor prior- wasn't that effective for her and caused tremors.  ? ? ?Also goes to Dr Hulan Saas- has been very helpful.  ? ?Also has significant fatigue ?Doesn't get good quality of sleep.  ? ?Still has dry eyes; also has some fogginess/fatigue.  ? ?[employer], PT- does laser now, not dry needling.- was seeing weekly. Then takes a break  ? ? ?Tried: ?Takes tramadol rarely. Doesn't want to take it at work- reserves for "tired of hurting" or really bad.  ?Usually takes tylenol arthritis and sometimes Aleve/Ibuprofen- helps ~ 50% of pain.  ?Has tried Cymbalta- changed to Vibryd- is SSRI/5HT1A partial agonist.  ?Has never tried Lyrica.  ?Never tried Naltrexone.  ?Done dry needling- tore obturator tendon- glute pain/SI joint B/L- seemed to flare the Sx's up ? ? 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  ?Pain Inventory ?Average Pain 4 ?Pain Right Now 5 ?My pain is constant and aching ? ?In the last 24 hours, has pain interfered with the following? ?General activity 3 ?Relation with others 1 ?Enjoyment of life 4 ?What TIME of day is your pain at its worst? evening ?Sleep (in general) Poor ? ?Pain is worse with:  flare up ?Pain improves with: rest, pacing  activities, and medication ?Relief from Meds: 7 ? ?walk without assistance ?how many minutes can you walk? 60 ?ability to climb steps?  yes ?do you drive?  yes ? 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 ?weakness ?depression ?anxiety ? ?Any changes since last visit?  no ? ?Any changes since last visit?  no ? ? ? ?Family History  ?Problem Relation Age of Onset  ? Breast cancer Mother   ? Hypertension Father   ? Hyperlipidemia Father   ? Other Brother   ?     lyme-chronic  ? Stroke Maternal Grandmother   ? Alcohol abuse Paternal Grandmother   ? Heart disease Paternal Grandfather   ? Colon cancer Neg Hx   ? Esophageal cancer Neg Hx   ? Rectal cancer Neg Hx   ? ?Social History  ? ?Socioeconomic History  ? Marital status: Married  ?  Spouse name: Not on file  ? Number of children: 3  ? Years of education: Not on file  ? Highest education level: Not on file  ?Occupational History  ? Occupation: Therapist, sports  ?  Employer: [employer]  ?Tobacco Use  ? Smoking status: Never  ? Smokeless tobacco: Never  ?Vaping Use  ? Vaping Use: Never used  ?Substance and Sexual Activity  ? Alcohol use: Yes  ? Drug use: No  ? Sexual activity: Yes  ?Other Topics Concern  ? Not  on file  ?Social History Narrative  ? 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 ?Social Determinants of Health  ? ?Financial Resource Strain: Not on file  ?Food Insecurity: Not on file  ?Transportation Needs: Not on file  ?Physical Activity: Not on file  ?Stress: Not on file  ?Social Connections: Not on file  ? ?Past Surgical History:  ?Procedure Laterality Date  ? CHOLECYSTECTOMY    ? DILATION AND CURETTAGE OF UTERUS    ? ENDOMETRIAL ABLATION    ? NOVASURE ABLATION    ? TUBAL LIGATION    ? ?Past Medical History:  ?Diagnosis Date  ? Allergy   ? Autoimmune disorder ([place])   ? Crohn's disease ([place])   ? Endometriosis   ? Hypertension   ? Kidney, medullary sponge   ? Ovarian cyst   ? Pre-eclampsia   ? Raynaud's disease   ? Stress incontinence in  female   ? ?BP 125/88   Pulse 83   Temp 98.1 ?F (36.7 ?C)   Ht 5' 3"  (1.6 m)   Wt 139 lb 12.8 oz (63.4 kg)   SpO2 99%   BMI 24.76 kg/m?  ? ?Opioid Risk Score:   ?Fall Risk Score:  `1 ? ?Depression screen PHQ 2/9 ? ? ?  06/02/2021  ? 10:58 AM 04/25/2021  ?  1:53 PM 06/07/2020  ?  8:17 AM 08/03/2018  ?  7:41 AM 02/18/2017  ? 10:45 AM 01/14/2015  ? 10:08 AM 01/01/2015  ? 11:41 AM  ?Depression screen PHQ 2/9  ?Decreased Interest 0 0 0 0 0 0 0  ?Down, Depressed, Hopeless 1 0 0 0 0 0 0  ?PHQ - 2 Score 1 0 0 0 0 0 0  ?Altered sleeping 2   0     ?Tired, decreased energy 2   1     ?Change in appetite 0   0     ?Feeling bad or failure about yourself  0   0     ?Trouble concentrating 0   0     ?Moving slowly or fidgety/restless 0   0     ?Suicidal thoughts 0   0     ?PHQ-9 Score 5   1     ?Difficult doing work/chores Somewhat difficult        ?  ? ?Review of Systems  ?Constitutional:  Positive for fatigue.  ?HENT: Negative.    ?Eyes: Negative.   ?Respiratory: Negative.    ?Cardiovascular: Negative.   ?Gastrointestinal:  Positive for constipation.  ?Endocrine: Negative.   ?Genitourinary: Negative.   ?Musculoskeletal:  Positive for back pain.  ?Skin: Negative.   ?Allergic/Immunologic: Negative.   ?Neurological:  Positive for weakness.  ?Hematological: Negative.   ?Psychiatric/Behavioral:  Positive for dysphoric mood. The patient is nervous/anxious.   ? ?   ?Objective:  ? Physical Exam ? ?Awake, alert, appropriate, NAD ?Sitting on table  ?Trigger points in first dorsal interossei, forearms extensor/flexors; pecs; scalenes, splenius capitus, levators; and upper traps as well as rhomboids, cervical paraspinals; and all paraspinals- less so in thoracic, but still evident; mild trigger point in anterior tibialis B/L ; Also gastroc and hamstrings also tight B/L  ? ? ? ?   ?Assessment & Plan:  ? ?Pt is a 46 yr old female with dx of Fibromyalgia, HTN, and hypothyroidism- last TSH 1.07;  Has medullar sponge kidney.  ?Here for evaluation  of fibromyalgia- got dx 10/21- but Sx's since 2017. Also has myofascial pain syndrome-  ? ?  [place]  ? ?2. Low dose Naltrexone 3 mg nightly x 1 week; then 6 mg nightly- and then called in 2nd Rx for 6 mg nightly x 90 days with 1 refill. Called in.  ? ?3. Dr Sharion Balloon- The survivor's Handbook to Fibromyalgia and Myofascial Pain Syndrome- great book! ?Theracane- 2-4 minutes on each trigger point- hold firm pressure, don't massage; can get for $20-30 online- will never need replacing- the bigger the muscle, the more time held.  ?Tennis balls- 2-5 minutes on each trigger point- buttocks, back of thighs, calves, low and mid back- can throw in dryer x1 to make softer. ?Magnesium 400 mg 2-3x/day for muscle tightness- titrate up until loose stools ?Over the counter- Lidocaine patches- 2-3 patches 12 hrs on;12 hrs off- areas of most pain- never on bottom of feet ?Rolling pin- can use on calves, thighs and arms, and buttocks- roll slowly over muscles firmly- roll towards heart  ?Try Youtube for theracane vidoes- very helpful.  ? ?3. F/U in 3 months- for possible trigger point injections and f/u on FMS.  ?

## 2021-06-02 NOTE — Patient Instructions (Addendum)
Pt is a 46 yr old female with dx of Fibromyalgia, HTN, and hypothyroidism- last TSH 1.07;  Has medullar sponge kidney.  ?Here for evaluation of fibromyalgia- got dx 10/21- but Sx's since 2017. Also has myofascial pain syndrome-  ? ?[place]  ? ?2. Low dose Naltrexone 3 mg nightly x 1 week; then 6 mg nightly- and then called in 2nd Rx for 6 mg nightly x 90 days with 1 refill.  ? ?3. Dr Sharion Balloon- The survivor's Handbook to Fibromyalgia and Myofascial Pain Syndrome- great book! ?Theracane- 2-4 minutes on each trigger point- hold firm pressure, don't massage; can get for $20-30 online- will never need replacing- the bigger the muscle, the more time held.  ?Tennis balls- 2-5 minutes on each trigger point- buttocks, back of thighs, calves, low and mid back- can throw in dryer x1 to make softer. ?Magnesium 400 mg 2-3x/day for muscle tightness- titrate up until loose stools ?Over the counter- Lidocaine patches- 2-3 patches 12 hrs on;12 hrs off- areas of most pain- never on bottom of feet ?Rolling pin- can use on calves, thighs and arms, and buttocks- roll slowly over muscles firmly- roll towards heart  ?Try Youtube for theracane vidoes- very helpful.  ? ?3. F/U in 3 months- for possible trigger point injections and f/u on FMS.  ? ?4. Make sure TSH is kept around 1.0- as a reminder. To keep pain better controlled.  ?

## 2021-06-06 IMAGING — CT CT ABD-PELV W/ CM
2 of 5 series · 16 of 46 positions shown, 18 images · IV contrast (APPLIED)
Comparison: None.

CLINICAL DATA: Nausea and vomiting, upper abdominal pain, elevated
white blood cell count

EXAM:
CT ABDOMEN AND PELVIS WITH CONTRAST
TECHNIQUE: Multidetector CT imaging of the abdomen and pelvis was performed
using the standard protocol following bolus administration of
intravenous contrast.
CONTRAST:  100mL OMNIPAQUE IOHEXOL 300 MG/ML  SOLN

[Series 2: axial st · axial · 0.80mm/px · z∈[-225,+145]mm · 13 of 86 slices shown, 15 images]
[im 6/86  soft-tissue]
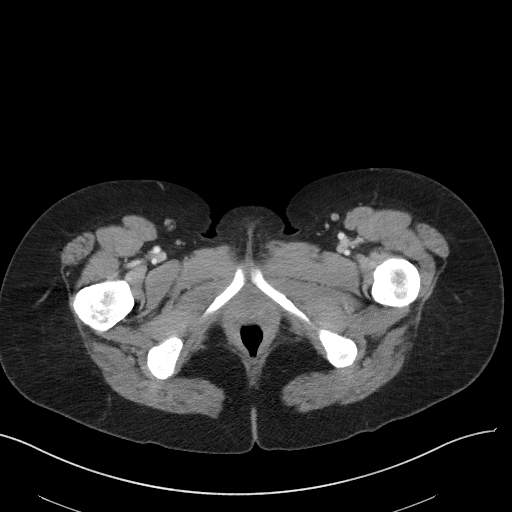
[im 6/86  bone]
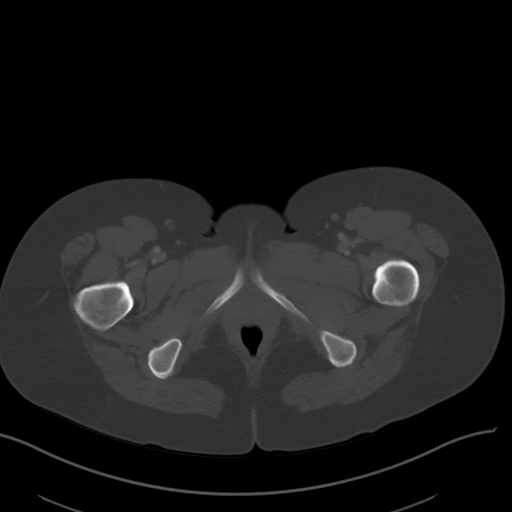
[im 12/86  soft-tissue]
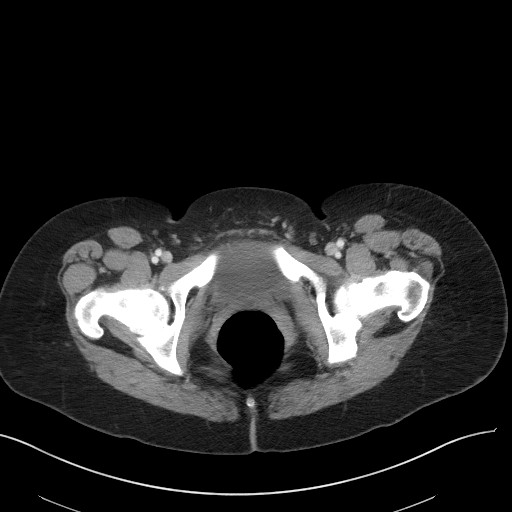
[im 18/86  soft-tissue]
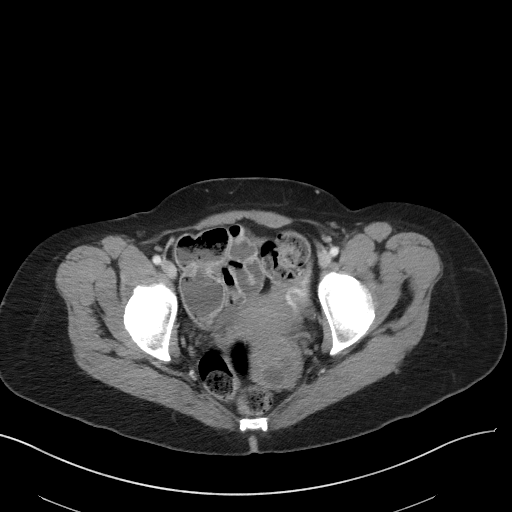
[im 23/86  soft-tissue]
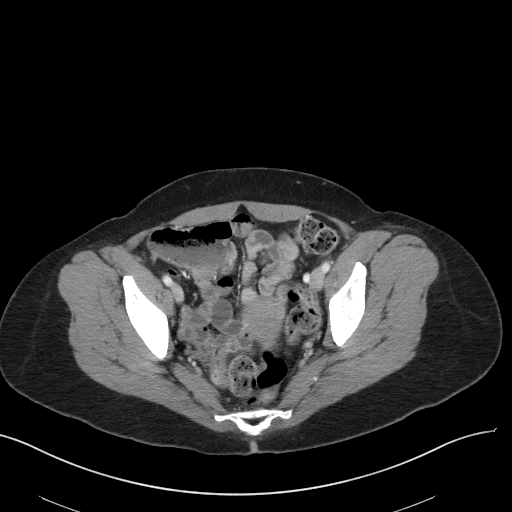
[im 29/86  soft-tissue]
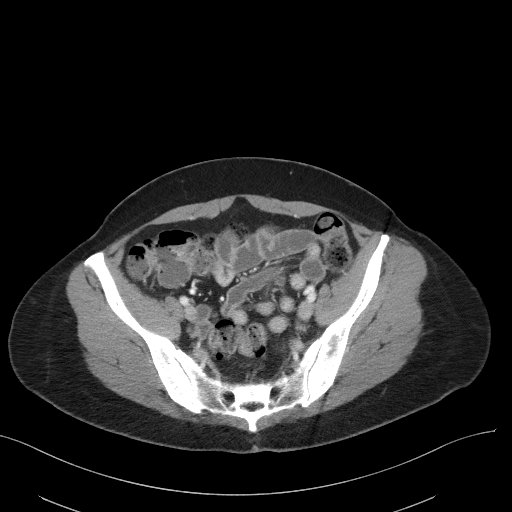
[im 35/86  soft-tissue]
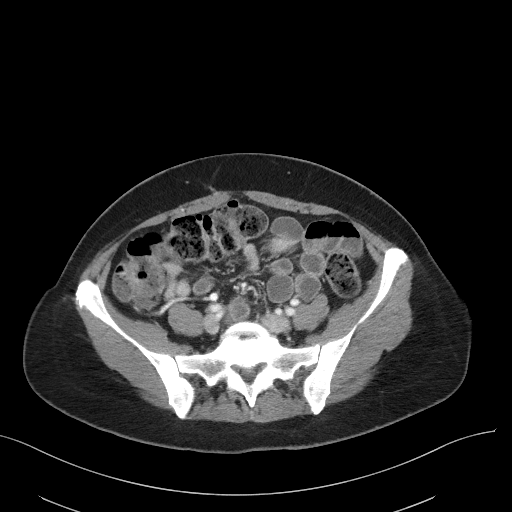
[im 46/86  soft-tissue]
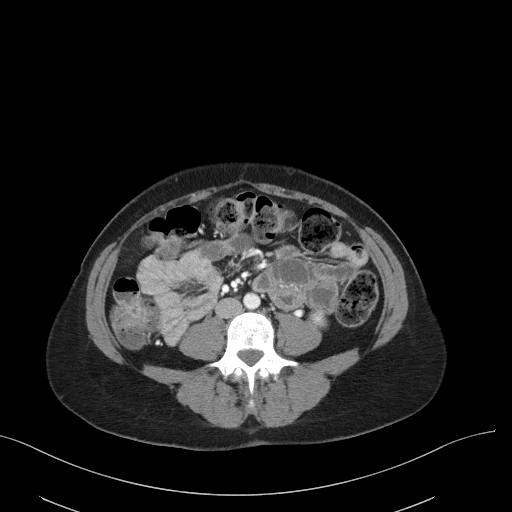
[im 52/86  soft-tissue]
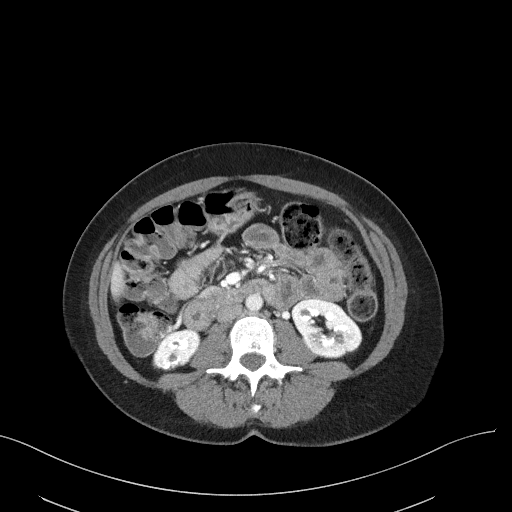
[im 57/86  soft-tissue]
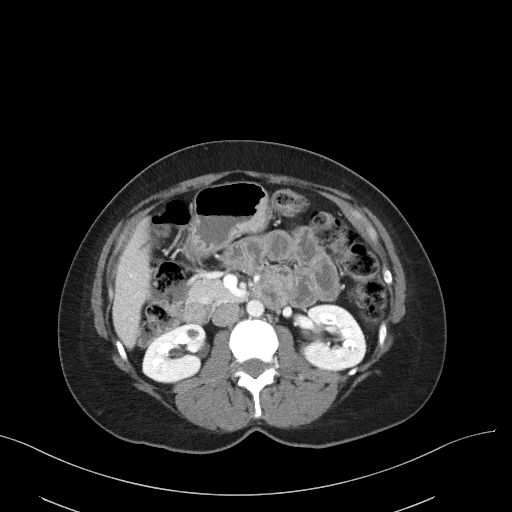
[im 57/86  bone]
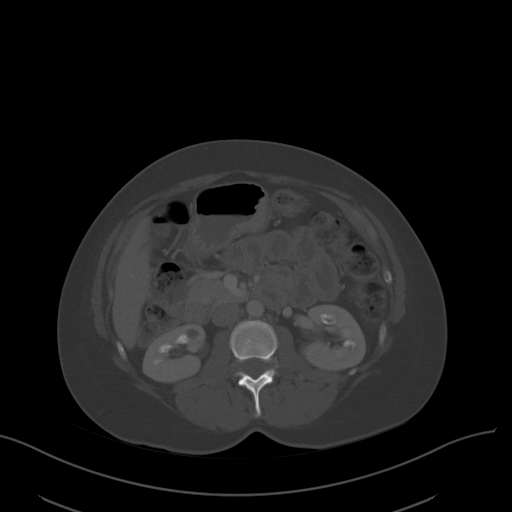
[im 63/86  soft-tissue]
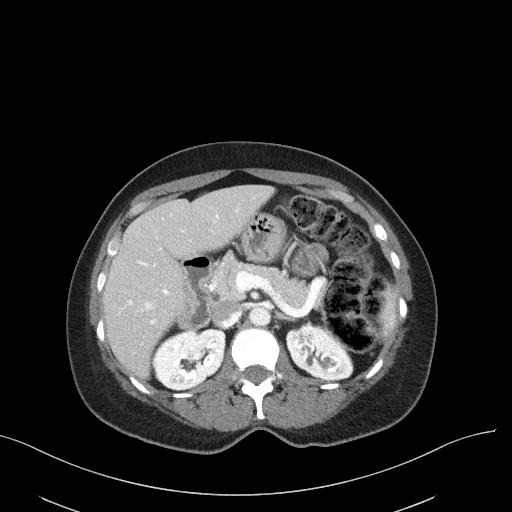
[im 69/86  soft-tissue]
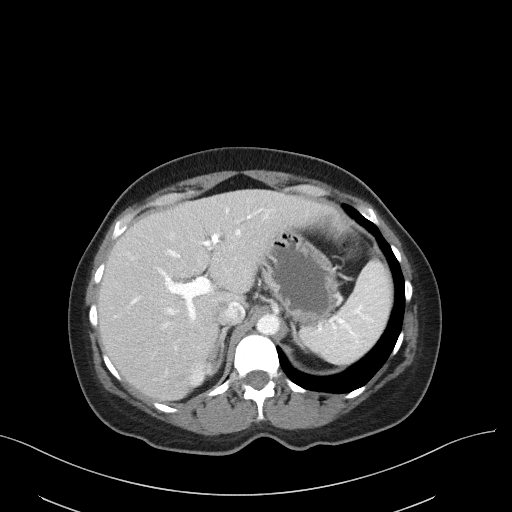
[im 74/86  soft-tissue]
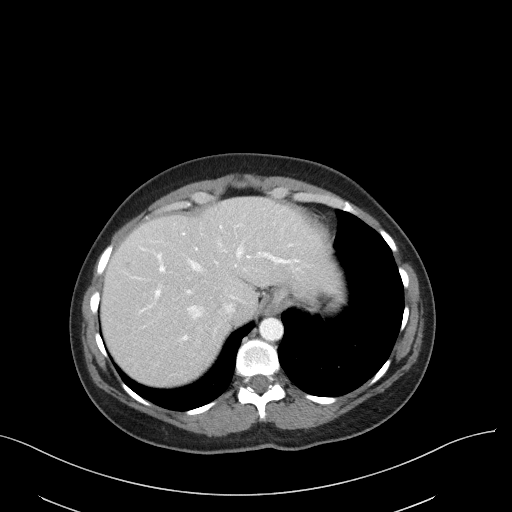
[im 80/86  soft-tissue]
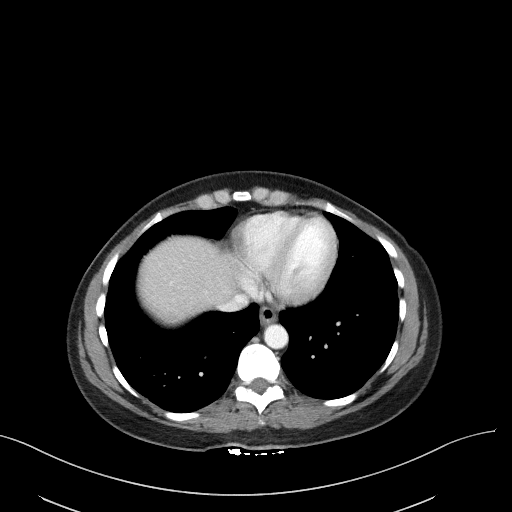

[Series 4: coronal st · coronal · 0.72mm/px · 3 of 89 slices shown]
[im 30/89  soft-tissue]
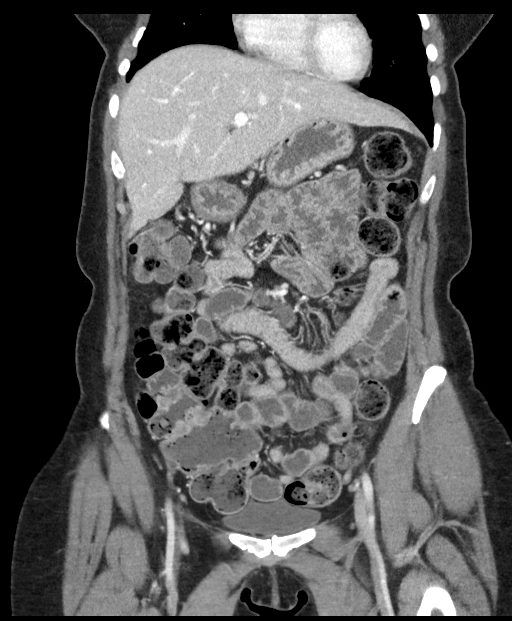
[im 40/89  soft-tissue]
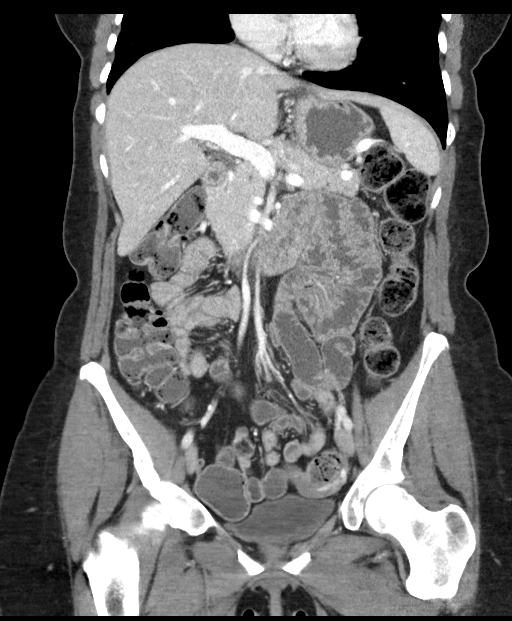
[im 49/89  soft-tissue]
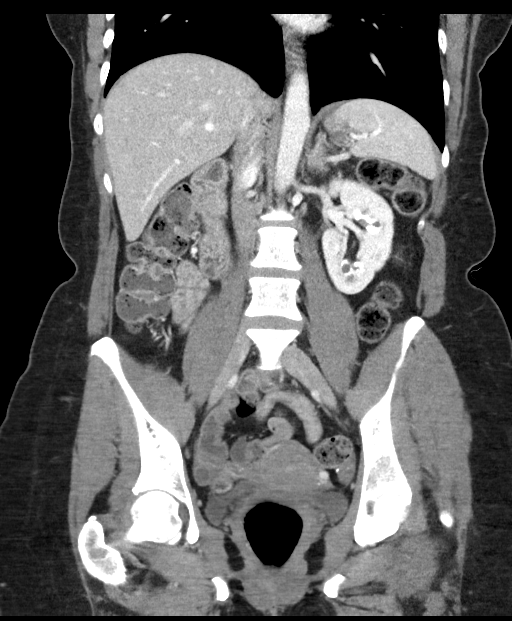

[16 of 46 positions shown; findings below may reference images not displayed]

FINDINGS: Lower chest: No acute pleural or parenchymal lung disease.

Hepatobiliary: No focal liver abnormality is seen. Status post
cholecystectomy. No biliary dilatation.

Pancreas: Unremarkable. No pancreatic ductal dilatation or
surrounding inflammatory changes.

Spleen: Normal in size without focal abnormality.

Adrenals/Urinary Tract: Marked increased density of the bilateral
medullary pyramids consistent with nephrocalcinosis. No obstructive
uropathy within either kidney. There are numerous bilateral renal
cortical and peripelvic cysts. The adrenals are unremarkable.
Bladder is grossly normal.

Stomach/Bowel: No bowel obstruction or ileus. Normal retrocecal
appendix. There is moderate stool throughout the colon compatible
with constipation. No bowel wall thickening or inflammatory changes.

Vascular/Lymphatic: No significant vascular findings are present. No
enlarged abdominal or pelvic lymph nodes.

Reproductive: Uterus and bilateral adnexa are unremarkable.
Nabothian cysts are incidentally noted within the cervix.

Other: No abdominal wall hernia or abnormality. No abdominopelvic
ascites.

Musculoskeletal: No acute or destructive bony lesions. Reconstructed
images demonstrate no additional findings.
IMPRESSION: 1. Moderate retained stool consistent with constipation.
2. Medullary nephrocalcinosis and bilateral renal cysts.
3. Otherwise no acute intra-abdominal or intrapelvic process.

## 2021-06-18 ENCOUNTER — Other Ambulatory Visit: Payer: Self-pay | Admitting: Obstetrics & Gynecology

## 2021-06-18 ENCOUNTER — Other Ambulatory Visit (HOSPITAL_BASED_OUTPATIENT_CLINIC_OR_DEPARTMENT_OTHER): Payer: Self-pay

## 2021-06-18 DIAGNOSIS — W57XXXA Bitten or stung by nonvenomous insect and other nonvenomous arthropods, initial encounter: Secondary | ICD-10-CM

## 2021-06-18 MED ORDER — SULFAMETHOXAZOLE-TRIMETHOPRIM 800-160 MG PO TABS
1.0000 | ORAL_TABLET | Freq: Two times a day (BID) | ORAL | 1 refills | Status: DC
  Filled 2021-06-18: qty 14, 7d supply, fill #0

## 2021-06-18 NOTE — Progress Notes (Signed)
Patient with insect bite and blanching erythema and pain in upper left chest. ?Bactrim DS prescribed ?Will continue to monitor symptoms. ? ? ?Lorraine Schneiders, MD, FACOG ?Obstetrician Social research officer, government, Faculty Practice ?[facility] ? ?

## 2021-06-23 ENCOUNTER — Other Ambulatory Visit: Payer: Self-pay | Admitting: Family Medicine

## 2021-06-24 ENCOUNTER — Other Ambulatory Visit (HOSPITAL_COMMUNITY): Payer: Self-pay

## 2021-06-25 ENCOUNTER — Other Ambulatory Visit (HOSPITAL_COMMUNITY): Payer: Self-pay

## 2021-06-25 MED ORDER — LEVOTHYROXINE SODIUM 50 MCG PO TABS
50.0000 ug | ORAL_TABLET | Freq: Every day | ORAL | 0 refills | Status: DC
  Filled 2021-06-25: qty 90, 90d supply, fill #0

## 2021-07-03 ENCOUNTER — Encounter: Payer: Self-pay | Admitting: Physical Medicine and Rehabilitation

## 2021-07-07 ENCOUNTER — Other Ambulatory Visit: Payer: Self-pay | Admitting: Family Medicine

## 2021-07-07 ENCOUNTER — Other Ambulatory Visit (HOSPITAL_COMMUNITY): Payer: Self-pay

## 2021-07-07 MED ORDER — VILAZODONE HCL 20 MG PO TABS
1.0000 | ORAL_TABLET | Freq: Every day | ORAL | 0 refills | Status: DC
  Filled 2021-07-07: qty 90, 90d supply, fill #0

## 2021-07-14 NOTE — Progress Notes (Signed)
?[name] D.O. ?[facility] ?[place] ?Phone: [phone] ?Subjective:   ?I, Jacqualin Combes, am serving as a scribe for Dr. Hulan Saas. ? ?This visit occurred during the SARS-CoV-2 public health emergency.  Safety protocols were in place, including screening questions prior to the visit, additional usage of staff PPE, and extensive cleaning of exam room while observing appropriate contact time as indicated for disinfecting solutions.  ? ? ?I'm seeing this patient by the request  of:  Binnie Rail, MD ? ?CC: back and neck pain  ? ?[id number]  ?[name] is a 46 y.o. female coming in with complaint of back and neck pain. OMT on 05/30/2021. Patient states that she is her back and hip pain are the same. Has seen pain mgmt for fibromyalgia pain. Feels like she is having more flares recently.  ? ?Medications patient has been prescribed: Synthroid Viibryd ? ?Taking: ? ? ?  ? ? ? ? ?Reviewed prior external information including notes and imaging from previsou exam, outside providers and external EMR if available.  ? ?As well as notes that were available from care everywhere and other healthcare systems. ? ?Past medical history, social, surgical and family history all reviewed in electronic medical record.  No pertanent information unless stated regarding to the chief complaint.  ? ?Past Medical History:  ?Diagnosis Date  ? Allergy   ? Autoimmune disorder ([place])   ? Crohn's disease ([place])   ? Endometriosis   ? Hypertension   ? Kidney, medullary sponge   ? Ovarian cyst   ? Pre-eclampsia   ? Raynaud's disease   ? Stress incontinence in female   ?  ?Allergies  ?Allergen Reactions  ? Lamisil [Terbinafine] Hives and Itching  ?  In pill form  ? ? ? ?Review of Systems: ? No headache, visual changes, nausea, vomiting, diarrhea, constipation, dizziness, abdominal pain, skin rash, fevers, chills, night sweats, weight loss, swollen lymph nodes, body aches, joint swelling,  chest pain, shortness of breath, mood changes. POSITIVE muscle aches ? ?Objective  ?Blood pressure 128/80, pulse 65, height 5' 3"  (1.6 m), weight 146 lb (66.2 kg), SpO2 98 %. ?  ?General: No apparent distress alert and oriented x3 mood and affect normal, dressed appropriately.  ?HEENT: Pupils equal, extraocular movements intact  ?Respiratory: Patient's speak in full sentences and does not appear short of breath  ?Cardiovascular: No lower extremity edema, non tender, no erythema  ?Gait normal with good balance and coordination.  ? ? ?Osteopathic findings ? ?C2 flexed rotated and side bent right ?C6 flexed rotated and side bent left ?T3 extended rotated and side bent right inhaled rib ?T7 extended rotated and side bent left ?L2 flexed rotated and side bent right ?Sacrum right on right ? ? ?  ?Assessment and Plan: ? ?Chronic pain syndrome ?Continues to have difficulty with fibromyalgia and chronic pain syndrome.  Also has been diagnosed recently with chronic myofascial pain.  Patient has been taking naltrexone but does not know if it is making significant improvement.  Encouraged her to continue to follow-up with pain management.  Discussed with patient about icing regimen and home exercises.  Discussed continuing the certain medications that were doing.  Patient will continue to be active where possible.  Does respond well to osteopathic manipulation.  Follow-up with me again in 8 weeks ?  ? ?Nonallopathic problems ? ?Decision today to treat with OMT was based on Physical Exam ? ?After verbal consent patient was treated with  HVLA, ME, FPR techniques in cervical, rib, thoracic, lumbar, and sacral  areas ? ?Patient tolerated the procedure well with improvement in symptoms ? ?Patient given exercises, stretches and lifestyle modifications ? ?See medications in patient instructions if given ? ?Patient will follow up in 4-8 weeks ? ?  ? ? ?The above documentation has been reviewed and is accurate and complete Lyndal Pulley, DO ? ? ? ?  ? ? Note: This dictation was prepared with Dragon dictation along with smaller phrase technology. Any transcriptional errors that result from this process are unintentional.    ?  ?  ? ?

## 2021-07-16 ENCOUNTER — Ambulatory Visit: Payer: 59 | Admitting: Family Medicine

## 2021-07-16 VITALS — BP 128/80 | HR 65 | Ht 63.0 in | Wt 146.0 lb

## 2021-07-16 DIAGNOSIS — M9904 Segmental and somatic dysfunction of sacral region: Secondary | ICD-10-CM

## 2021-07-16 DIAGNOSIS — M9902 Segmental and somatic dysfunction of thoracic region: Secondary | ICD-10-CM | POA: Diagnosis not present

## 2021-07-16 DIAGNOSIS — M9903 Segmental and somatic dysfunction of lumbar region: Secondary | ICD-10-CM | POA: Diagnosis not present

## 2021-07-16 DIAGNOSIS — M9908 Segmental and somatic dysfunction of rib cage: Secondary | ICD-10-CM

## 2021-07-16 DIAGNOSIS — M9901 Segmental and somatic dysfunction of cervical region: Secondary | ICD-10-CM | POA: Diagnosis not present

## 2021-07-16 DIAGNOSIS — G894 Chronic pain syndrome: Secondary | ICD-10-CM | POA: Diagnosis not present

## 2021-07-16 NOTE — Patient Instructions (Signed)
Congrats to your son ?Keep finding time for yourself ?See me in 6-7 weeks ?

## 2021-07-17 NOTE — Assessment & Plan Note (Signed)
Continues to have difficulty with fibromyalgia and chronic pain syndrome.  Also has been diagnosed recently with chronic myofascial pain.  Patient has been taking naltrexone but does not know if it is making significant improvement.  Encouraged her to continue to follow-up with pain management.  Discussed with patient about icing regimen and home exercises.  Discussed continuing the certain medications that were doing.  Patient will continue to be active where possible.  Does respond well to osteopathic manipulation.  Follow-up with me again in 8 weeks ?

## 2021-08-17 ENCOUNTER — Other Ambulatory Visit: Payer: Self-pay | Admitting: Internal Medicine

## 2021-08-18 ENCOUNTER — Other Ambulatory Visit (HOSPITAL_COMMUNITY): Payer: Self-pay

## 2021-08-18 MED ORDER — NITROFURANTOIN MACROCRYSTAL 50 MG PO CAPS
ORAL_CAPSULE | ORAL | 0 refills | Status: DC
Start: 2021-08-18 — End: 2022-06-20
  Filled 2021-08-18: qty 45, 45d supply, fill #0

## 2021-08-19 NOTE — Progress Notes (Unsigned)
[name] [address] [place] Phone: [phone] Subjective:   [name], am serving as a scribe for [name].  I'm seeing this patient by the request  of:  Lorraine Rail, MD  CC: Increasing and worsening back pain  [id number]  [name] is a 46 y.o. female coming in with complaint of back and neck pain. OMT 07/16/2021.  Patient has been seen for relation multiple times.  He has been seen in provider as well as Dr. Dagoberto Ligas, and is now on naltrexone.  Patient states overall back pain has not been bad, but 10 days ago flared up and hasn't calmed down. Added some supplements. No new complaints  Medications patient has been prescribed: Synthroid          Reviewed prior external information including notes and imaging from previsou exam, outside providers and external EMR if available.   As well as notes that were available from care everywhere and other healthcare systems.  Past medical history, social, surgical and family history all reviewed in electronic medical record.  No pertanent information unless stated regarding to the chief complaint.   Past Medical History:  Diagnosis Date   Allergy    Autoimmune disorder ([place])    Crohn's disease ([place])    Endometriosis    Hypertension    Kidney, medullary sponge    Ovarian cyst    Pre-eclampsia    Raynaud's disease    Stress incontinence in female     Allergies  Allergen Reactions   Lamisil [Terbinafine] Hives and Itching    In pill form     Review of Systems:  No headache, visual changes, nausea, vomiting, diarrhea, constipation, dizziness, abdominal pain, skin rash, fevers, chills, night sweats, weight loss, swollen lymph nodes,joint swelling, chest pain, shortness of breath, mood changes. POSITIVE muscle aches, body aches  Objective  Blood pressure 120/84, pulse (!) 106, height 5' 3"  (1.6 m), weight 142 lb (64.4 kg), SpO2 99 %.   General:  No apparent distress alert and oriented x3 mood and affect normal, dressed appropriately.  HEENT: Pupils equal, extraocular movements intact  Respiratory: Patient's speak in full sentences and does not appear short of breath  Cardiovascular: No lower extremity edema, non tender, no erythema  Gait normal MSK:  Back patient's low back does have some tightness noted.  Seems to be in the thoracolumbar juncture.  Nontender to palpation diffusely.  Patient's neck exam does have tightness noted as well.  Positive tightness with straight leg test with subjective radicular symptoms.  Osteopathic findings  C2 flexed rotated and side bent right C6 flexed rotated and side bent left T3 extended rotated and side bent right inhaled rib T9 extended rotated and side bent left L1 flexed rotated and side bent right L4 flexed rotated and side bent left Sacrum right on right       Assessment and Plan:  Lumbar radiculopathy Chronic, worsening symptoms.  Still having difficulty with chronic pain as well.  Toradol and Depo-Medrol given in as well to the injections patient knows she can take the prednisone if needed as well.  Wants to avoid any chronic medications where she can.  Has had difficulty or no improvement with multiple medications.  Has seen pain management and felt like to control naltrexone did not make improvement.  Follow-up with me again in 5 to 6 weeks.    Nonallopathic problems  Decision today to treat with OMT was based on  Physical Exam  After verbal consent patient was treated with HVLA, ME, FPR techniques in cervical, rib, thoracic, lumbar, and sacral  areas  Patient tolerated the procedure well with improvement in symptoms  Patient given exercises, stretches and lifestyle modifications  See medications in patient instructions if given  Patient will follow up in 4-8 weeks     The above documentation has been reviewed and is accurate and complete Lorraine Pulley, DO          Note: This dictation was prepared with Dragon dictation along with smaller phrase technology. Any transcriptional errors that result from this process are unintentional.

## 2021-08-20 ENCOUNTER — Encounter: Payer: Self-pay | Admitting: Family Medicine

## 2021-08-20 ENCOUNTER — Other Ambulatory Visit (HOSPITAL_COMMUNITY): Payer: Self-pay

## 2021-08-20 ENCOUNTER — Ambulatory Visit: Payer: 59 | Admitting: Family Medicine

## 2021-08-20 VITALS — BP 120/84 | HR 106 | Ht 63.0 in | Wt 142.0 lb

## 2021-08-20 DIAGNOSIS — M9903 Segmental and somatic dysfunction of lumbar region: Secondary | ICD-10-CM | POA: Diagnosis not present

## 2021-08-20 DIAGNOSIS — M9902 Segmental and somatic dysfunction of thoracic region: Secondary | ICD-10-CM

## 2021-08-20 DIAGNOSIS — M5416 Radiculopathy, lumbar region: Secondary | ICD-10-CM | POA: Diagnosis not present

## 2021-08-20 DIAGNOSIS — M9904 Segmental and somatic dysfunction of sacral region: Secondary | ICD-10-CM

## 2021-08-20 DIAGNOSIS — M9901 Segmental and somatic dysfunction of cervical region: Secondary | ICD-10-CM | POA: Diagnosis not present

## 2021-08-20 DIAGNOSIS — M9908 Segmental and somatic dysfunction of rib cage: Secondary | ICD-10-CM | POA: Diagnosis not present

## 2021-08-20 MED ORDER — TIZANIDINE HCL 4 MG PO TABS
4.0000 mg | ORAL_TABLET | Freq: Every day | ORAL | 0 refills | Status: DC
  Filled 2021-08-20: qty 30, 30d supply, fill #0

## 2021-08-20 MED ORDER — METHYLPREDNISOLONE ACETATE 40 MG/ML IJ SUSP
40.0000 mg | Freq: Once | INTRAMUSCULAR | Status: AC
  Administered 2021-08-20: 40 mg via INTRAMUSCULAR

## 2021-08-20 MED ORDER — KETOROLAC TROMETHAMINE 60 MG/2ML IM SOLN
60.0000 mg | Freq: Once | INTRAMUSCULAR | Status: AC
  Administered 2021-08-20: 30 mg via INTRAMUSCULAR

## 2021-08-20 NOTE — Assessment & Plan Note (Signed)
Chronic, worsening symptoms.  Still having difficulty with chronic pain as well.  Toradol and Depo-Medrol given in as well to the injections patient knows she can take the prednisone if needed as well.  Wants to avoid any chronic medications where she can.  Has had difficulty or no improvement with multiple medications.  Has seen pain management and felt like to control naltrexone did not make improvement.  Follow-up with me again in 5 to 6 weeks.

## 2021-08-20 NOTE — Patient Instructions (Addendum)
Zanaflex 4m at night for 3 nights then as needed See you again in 5-6 weeks

## 2021-08-27 DIAGNOSIS — M25551 Pain in right hip: Secondary | ICD-10-CM | POA: Diagnosis not present

## 2021-08-27 DIAGNOSIS — M542 Cervicalgia: Secondary | ICD-10-CM | POA: Diagnosis not present

## 2021-09-02 ENCOUNTER — Ambulatory Visit: Payer: 59 | Admitting: Family Medicine

## 2021-09-15 ENCOUNTER — Other Ambulatory Visit: Payer: Self-pay | Admitting: Internal Medicine

## 2021-09-15 ENCOUNTER — Other Ambulatory Visit: Payer: Self-pay | Admitting: Family Medicine

## 2021-09-15 ENCOUNTER — Other Ambulatory Visit (HOSPITAL_COMMUNITY): Payer: Self-pay

## 2021-09-15 MED ORDER — AMLODIPINE BESYLATE 5 MG PO TABS
5.0000 mg | ORAL_TABLET | Freq: Every day | ORAL | 1 refills | Status: DC
  Filled 2021-09-15: qty 90, 90d supply, fill #0
  Filled 2021-12-21: qty 90, 90d supply, fill #1

## 2021-09-16 ENCOUNTER — Other Ambulatory Visit (HOSPITAL_COMMUNITY): Payer: Self-pay

## 2021-09-16 MED ORDER — LEVOTHYROXINE SODIUM 50 MCG PO TABS
50.0000 ug | ORAL_TABLET | Freq: Every day | ORAL | 0 refills | Status: DC
  Filled 2021-09-16: qty 90, 90d supply, fill #0

## 2021-09-18 ENCOUNTER — Other Ambulatory Visit (HOSPITAL_COMMUNITY): Payer: Self-pay

## 2021-09-19 ENCOUNTER — Other Ambulatory Visit (HOSPITAL_COMMUNITY): Payer: Self-pay

## 2021-09-19 ENCOUNTER — Encounter: Payer: Self-pay | Admitting: Physical Medicine and Rehabilitation

## 2021-09-19 ENCOUNTER — Encounter: Payer: 59 | Attending: Physical Medicine and Rehabilitation | Admitting: Physical Medicine and Rehabilitation

## 2021-09-19 VITALS — BP 129/85 | HR 75 | Ht 63.0 in | Wt 143.8 lb

## 2021-09-19 DIAGNOSIS — M797 Fibromyalgia: Secondary | ICD-10-CM | POA: Diagnosis not present

## 2021-09-19 DIAGNOSIS — M7918 Myalgia, other site: Secondary | ICD-10-CM | POA: Insufficient documentation

## 2021-09-19 MED ORDER — PREGABALIN 50 MG PO CAPS
ORAL_CAPSULE | ORAL | 5 refills | Status: DC
  Filled 2021-09-19: qty 117, 27d supply, fill #0
  Filled 2021-09-19: qty 3, 1d supply, fill #0
  Filled 2021-10-27: qty 120, 30d supply, fill #1
  Filled 2021-12-16: qty 120, 30d supply, fill #2

## 2021-09-19 NOTE — Progress Notes (Signed)
Subjective:    Patient ID: [name], female    DOB: [birth year], 47 y.o.   MRN: [id number]  HPI  Pt is a 46 yr old female with dx of Fibromyalgia, HTN, and hypothyroidism- last TSH 1.07;  Has medullar sponge kidney.  Here for evaluation of fibromyalgia- got dx 10/21- but Sx's since 2017. Also has myofascial pain syndrome-   Here for f/u on Fibromyalgia.   Was using a foam roller- on yoga mat- and cat cow yoga stretch and pulled back out-  Then bent over to shave legs and got muscle spasms.    Feels like Dr Tamala Julian is helpful to see.  Started with [facility]- can do myofascial release- and some PT- started late June 2023- have had 2 visits so far.  Sticking with myofascial release.    Not doing dry needling, because, Dry needling can set off "whole system".   Has massage therapist sees for myofascial release- sees 1x/month- was on maternity leave for awhile.  And tried someone else- got worse and wasn't therapeutic.  And she's great to work on real tight places without getting flared up.   Saw 3-4 weeks ago- is due- and she's booked up.   Naltrexone- didn't work- On lower dose - thought maybe helpful, but not when increased dose.   Had horrible flare then- doesn't know what set it off- and wasn't helping with had flare. Also doesn't think it helped the maintenance level of pain.   Goes to [facility] (not now) intermittently for 1x/week for ~ 3 months then takes a break.    Would like to feel better than does right now.  Takes tramadol from time to time, but not ideal.  Doesn't feel impaired with taking tramadol, but avoids it just in case.   Dr Quay Burow refills tramadol-   Has read some of Dr Charlton Haws book- Listened to it.   Is doing the Magnesium 500 mg- usually 2x/day but if stools loose, takes 1x/day.  Has the theracane- hasn't used much- Caitlin to help with teaching how to use. Also given ball bigger than tennis ball- to help hold pressure as well.   Pain  Inventory Average Pain 4 Pain Right Now 3 My pain is constant and aching  In the last 24 hours, has pain interfered with the following? General activity 2 Relation with others 2 Enjoyment of life 4 What TIME of day is your pain at its worst? evening Sleep (in general) Fair  Pain is worse with:  flare ups Pain improves with: rest, pacing activities, and medication Relief from Meds: 7  Family History  Problem Relation Age of Onset   Breast cancer Mother    Hypertension Father    Hyperlipidemia Father    Other Brother        lyme-chronic   Stroke Maternal Grandmother    Alcohol abuse Paternal Grandmother    Heart disease Paternal Grandfather    Colon cancer Neg Hx    Esophageal cancer Neg Hx    Rectal cancer Neg Hx    Social History   Socioeconomic History   Marital status: Married    Spouse name: Not on file   Number of children: 3   Years of education: Not on file   Highest education level: Not on file  Occupational History   Occupation: Programmer, multimedia: [employer]  Tobacco Use   Smoking status: Never   Smokeless tobacco: Never  Vaping Use   Vaping Use: Never  used  Substance and Sexual Activity   Alcohol use: Yes   Drug use: No   Sexual activity: Yes  Other Topics Concern   Not on file  Social History Narrative   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, OR   Social Determinants of Health   Financial Resource Strain: Not on file  Food Insecurity: Not on file  Transportation Needs: Not on file  Physical Activity: Not on file  Stress: Not on file  Social Connections: Not on file   Past Surgical History:  Procedure Laterality Date   CHOLECYSTECTOMY     DILATION AND CURETTAGE OF UTERUS     ENDOMETRIAL ABLATION     [place]     Past Surgical History:  Procedure Laterality Date   CHOLECYSTECTOMY     DILATION AND CURETTAGE OF UTERUS     ENDOMETRIAL ABLATION     [place]     Past Medical  History:  Diagnosis Date   Allergy    Autoimmune disorder ([place])    Crohn's disease ([place])    Endometriosis    Hypertension    Kidney, medullary sponge    Ovarian cyst    Pre-eclampsia    Raynaud's disease    Stress incontinence in female    BP 129/85   Pulse 75   Ht '5\' 3"'$  (1.6 m)   Wt 143 lb 12.8 oz (65.2 kg)   SpO2 98%   BMI 25.47 kg/m   Opioid Risk Score:   Fall Risk Score:  `1  Depression screen [facility] 2/9     09/19/2021    2:05 PM 06/02/2021   10:58 AM 04/25/2021    1:53 PM 06/07/2020    8:17 AM 08/03/2018    7:41 AM 02/18/2017   10:45 AM 01/14/2015   10:08 AM  Depression screen [facility] 2/9  Decreased Interest 0 0 0 0 0 0 0  Down, Depressed, Hopeless 0 1 0 0 0 0 0  PHQ - 2 Score 0 1 0 0 0 0 0  Altered sleeping  2   0    Tired, decreased energy  2   1    Change in appetite  0   0    Feeling bad or failure about yourself   0   0    Trouble concentrating  0   0    Moving slowly or fidgety/restless  0   0    Suicidal thoughts  0   0    PHQ-9 Score  5   1    Difficult doing work/chores  Somewhat difficult          Review of Systems  Constitutional: Negative.   HENT: Negative.    Eyes: Negative.   Respiratory: Negative.    Cardiovascular: Negative.   Gastrointestinal: Negative.   Endocrine: Negative.   Genitourinary: Negative.   Musculoskeletal: Negative.   Skin: Negative.   Allergic/Immunologic: Negative.   Neurological: Negative.   Hematological: Negative.   Psychiatric/Behavioral: Negative.        Objective:   Physical Exam  Awake, alert, appropriate; sitting on side chair, cannot tolerate table, NAD A little tight in extensor compartment of R forearm However loose on L side Tight/trigger points on upper traps, levators; pecs and scalenes Less so in rhomboids and paraspinals.       Assessment & Plan:   Pt is a 46 yr old female with dx of  Fibromyalgia, HTN, and hypothyroidism- last TSH 1.07;  Has medullar sponge kidney.  Here for evaluation of  fibromyalgia- got dx 10/21- but Sx's since 2017. Also has myofascial pain syndrome- Here for f/u on Fibromyalgia.    Discussed options about treating fibromyalgia- discussed Lyrica.   2.  Will try lyrica- 50 mg BID x 1 week, then 100 mg BID- for nerve pain/fibromyalgia-  - can cause 5% of patients to have hand and foot swelling- Stop if gets significant swelling-   3.  Con't theracane, magnesium and myofascial release.    4. Can use migraine accupressure to hold pressure on hands- thenar eminence and first dorsal interossei.   5. Episport brace- elbow- can help relax so hands aren't as much of an issue sometimes.    6. F/U in 3 months-  Need some knowledge that wants trigger point injections at the following appointment ahead of time.    I spent a total of  31  minutes on total care today- >50% coordination of care- due to discussing options as above detailed.

## 2021-09-19 NOTE — Patient Instructions (Signed)
Pt is a 46 yr old female with dx of Fibromyalgia, HTN, and hypothyroidism- last TSH 1.07;  Has medullar sponge kidney.  Here for evaluation of fibromyalgia- got dx 10/21- but Sx's since 2017. Also has myofascial pain syndrome- Here for f/u on Fibromyalgia.    Discussed options about treating fibromyalgia- discussed Lyrica.   2.  Will try lyrica- 50 mg BID x 1 week, then 100 mg BID- for nerve pain/fibromyalgia-  - can cause 5% of patients to have hand and foot swelling- Stop if gets significant swelling-   3.  Con't theracane, magnesium and myofascial release.    4. Can use migraine accupressure to hold pressure on hands- thenar eminence and first dorsal interossei.   5. Episport brace- elbow- can help relax so hands aren't as much of an issue sometimes.    6. F/U in 3 months-  Need some knowledge that wants trigger point injections at the following appointment ahead of time.

## 2021-10-02 NOTE — Progress Notes (Deleted)
  [place] Phone: [phone] Subjective:    I'm seeing this patient by the request  of:  Burns, Claudina Lick, MD  CC:   [id number]  [name] is a 46 y.o. female coming in with complaint of back and neck pain. OMT on 08/20/2021. Patient states   Medications patient has been prescribed: Zanaflex Synthroid  Taking:         Reviewed prior external information including notes and imaging from previsou exam, outside providers and external EMR if available.   As well as notes that were available from care everywhere and other healthcare systems.  Past medical history, social, surgical and family history all reviewed in electronic medical record.  No pertanent information unless stated regarding to the chief complaint.   Past Medical History:  Diagnosis Date   Allergy    Autoimmune disorder ([place])    Crohn's disease ([place])    Endometriosis    Hypertension    Kidney, medullary sponge    Ovarian cyst    Pre-eclampsia    Raynaud's disease    Stress incontinence in female     Allergies  Allergen Reactions   Lamisil [Terbinafine] Hives and Itching    In pill form     Review of Systems:  No headache, visual changes, nausea, vomiting, diarrhea, constipation, dizziness, abdominal pain, skin rash, fevers, chills, night sweats, weight loss, swollen lymph nodes, body aches, joint swelling, chest pain, shortness of breath, mood changes. POSITIVE muscle aches  Objective  There were no vitals taken for this visit.   General: No apparent distress alert and oriented x3 mood and affect normal, dressed appropriately.  HEENT: Pupils equal, extraocular movements intact  Respiratory: Patient's speak in full sentences and does not appear short of breath  Cardiovascular: No lower extremity edema, non tender, no erythema  Gait MSK:  Back   Osteopathic findings  C2 flexed rotated and side bent right C6  flexed rotated and side bent left T3 extended rotated and side bent right inhaled rib T9 extended rotated and side bent left L2 flexed rotated and side bent right Sacrum right on right       Assessment and Plan:  No problem-specific Assessment & Plan notes found for this encounter.    Nonallopathic problems  Decision today to treat with OMT was based on Physical Exam  After verbal consent patient was treated with HVLA, ME, FPR techniques in cervical, rib, thoracic, lumbar, and sacral  areas  Patient tolerated the procedure well with improvement in symptoms  Patient given exercises, stretches and lifestyle modifications  See medications in patient instructions if given  Patient will follow up in 4-8 weeks             Note: This dictation was prepared with Dragon dictation along with smaller phrase technology. Any transcriptional errors that result from this process are unintentional.

## 2021-10-03 ENCOUNTER — Ambulatory Visit: Payer: 59 | Admitting: Family Medicine

## 2021-10-06 ENCOUNTER — Other Ambulatory Visit: Payer: Self-pay | Admitting: Family Medicine

## 2021-10-06 ENCOUNTER — Other Ambulatory Visit (HOSPITAL_COMMUNITY): Payer: Self-pay

## 2021-10-06 MED ORDER — VILAZODONE HCL 20 MG PO TABS
1.0000 | ORAL_TABLET | Freq: Every day | ORAL | 0 refills | Status: DC
  Filled 2021-10-06: qty 90, 90d supply, fill #0

## 2021-10-07 NOTE — Progress Notes (Deleted)
  [place] Phone: [phone] Subjective:    I'm seeing this patient by the request  of:  Burns, Claudina Lick, MD  CC:   [id number]  [name] is a 46 y.o. female coming in with complaint of back and neck pain. OMT 08/20/2021. Patient states   Medications patient has been prescribed: Zanaflex, Synthroid, Viibryd  Taking:         Reviewed prior external information including notes and imaging from previsou exam, outside providers and external EMR if available.   As well as notes that were available from care everywhere and other healthcare systems.  Past medical history, social, surgical and family history all reviewed in electronic medical record.  No pertanent information unless stated regarding to the chief complaint.   Past Medical History:  Diagnosis Date   Allergy    Autoimmune disorder ([place])    Crohn's disease ([place])    Endometriosis    Hypertension    Kidney, medullary sponge    Ovarian cyst    Pre-eclampsia    Raynaud's disease    Stress incontinence in female     Allergies  Allergen Reactions   Lamisil [Terbinafine] Hives and Itching    In pill form     Review of Systems:  No headache, visual changes, nausea, vomiting, diarrhea, constipation, dizziness, abdominal pain, skin rash, fevers, chills, night sweats, weight loss, swollen lymph nodes, body aches, joint swelling, chest pain, shortness of breath, mood changes. POSITIVE muscle aches  Objective  There were no vitals taken for this visit.   General: No apparent distress alert and oriented x3 mood and affect normal, dressed appropriately.  HEENT: Pupils equal, extraocular movements intact  Respiratory: Patient's speak in full sentences and does not appear short of breath  Cardiovascular: No lower extremity edema, non tender, no erythema  Gait MSK:  Back   Osteopathic findings  C2 flexed rotated and side bent  right C6 flexed rotated and side bent left T3 extended rotated and side bent right inhaled rib T9 extended rotated and side bent left L2 flexed rotated and side bent right Sacrum right on right       Assessment and Plan:  No problem-specific Assessment & Plan notes found for this encounter.    Nonallopathic problems  Decision today to treat with OMT was based on Physical Exam  After verbal consent patient was treated with HVLA, ME, FPR techniques in cervical, rib, thoracic, lumbar, and sacral  areas  Patient tolerated the procedure well with improvement in symptoms  Patient given exercises, stretches and lifestyle modifications  See medications in patient instructions if given  Patient will follow up in 4-8 weeks             Note: This dictation was prepared with Dragon dictation along with smaller phrase technology. Any transcriptional errors that result from this process are unintentional.

## 2021-10-08 ENCOUNTER — Ambulatory Visit: Payer: 59 | Admitting: Family Medicine

## 2021-10-10 ENCOUNTER — Other Ambulatory Visit (HOSPITAL_COMMUNITY): Payer: Self-pay

## 2021-10-27 ENCOUNTER — Other Ambulatory Visit (HOSPITAL_COMMUNITY): Payer: Self-pay

## 2021-10-30 NOTE — Progress Notes (Signed)
[place] Phone: [phone] Subjective:   [name], am serving as a scribe for Dr. Hulan Saas.  I'm seeing this patient by the request  of:  Binnie Rail, MD  CC: Back and neck pain follow-up  [id number]  [name] is a 46 y.o. female coming in with complaint of back and neck pain. OMT on 08/20/2021. Patient states that she has not been doing stretches and her SI joints have shifted. Pain does not radiate but radiates at times into glute med. Patient also   Medications patient has been prescribed: Viibyrd Synthroid Zanaflex  Taking:         Reviewed prior external information including notes and imaging from previsou exam, outside providers and external EMR if available.   As well as notes that were available from care everywhere and other healthcare systems.  Past medical history, social, surgical and family history all reviewed in electronic medical record.  No pertanent information unless stated regarding to the chief complaint.   Past Medical History:  Diagnosis Date   Allergy    Autoimmune disorder ([place])    Crohn's disease ([place])    Endometriosis    Hypertension    Kidney, medullary sponge    Ovarian cyst    Pre-eclampsia    Raynaud's disease    Stress incontinence in female     Allergies  Allergen Reactions   Lamisil [Terbinafine] Hives and Itching    In pill form     Review of Systems:  No headache, visual changes, nausea, vomiting, diarrhea, constipation, dizziness, abdominal pain, skin rash, fevers, chills, night sweats, weight loss, swollen lymph nodes, body aches, joint swelling, chest pain, shortness of breath, mood changes. POSITIVE muscle aches  Objective  Blood pressure 104/74, height '5\' 3"'$  (1.6 m), weight 143 lb (64.9 kg).   General: No apparent distress alert and oriented x3 mood and affect normal, dressed appropriately.  HEENT: Pupils equal,  extraocular movements intact  Respiratory: Patient's speak in full sentences and does not appear short of breath  Cardiovascular: No lower extremity edema, non tender, no erythema  Gait MSK:  Back does have loss of lordosis.  Some tenderness to palpation in the neck area.  Pain is out of proportion.  Does have some limited sidebending bilaterally.  Negative Spurling's  Osteopathic findings  C2 flexed rotated and side bent right C7 flexed rotated and side bent left T3 extended rotated and side bent right inhaled rib T8 extended rotated and side bent left L2 flexed rotated and side bent right Sacrum right on right       Assessment and Plan:  SI (sacroiliac) joint dysfunction Still had significant tightness noted.  Discussed with patient again about icing regimen and home exercises.  Overall has made some improvement but is still having difficulty and is trying to find different medications.  Working with the [facility] as well.  Follow-up with me again in 6 to 8 weeks    Nonallopathic problems  Decision today to treat with OMT was based on Physical Exam  After verbal consent patient was treated with HVLA, ME, FPR techniques in cervical, rib, thoracic, lumbar, and sacral  areas  Patient tolerated the procedure well with improvement in symptoms  Patient given exercises, stretches and lifestyle modifications  See medications in patient instructions if given  Patient will follow up in 4-8 weeks    The above documentation has been reviewed and is accurate  and complete Lorraine Pulley, DO          Note: This dictation was prepared with Dragon dictation along with smaller phrase technology. Any transcriptional errors that result from this process are unintentional.

## 2021-10-31 ENCOUNTER — Ambulatory Visit: Payer: 59 | Admitting: Family Medicine

## 2021-10-31 ENCOUNTER — Encounter: Payer: Self-pay | Admitting: Family Medicine

## 2021-10-31 VITALS — BP 104/74 | Ht 63.0 in | Wt 143.0 lb

## 2021-10-31 DIAGNOSIS — M9903 Segmental and somatic dysfunction of lumbar region: Secondary | ICD-10-CM

## 2021-10-31 DIAGNOSIS — M9901 Segmental and somatic dysfunction of cervical region: Secondary | ICD-10-CM

## 2021-10-31 DIAGNOSIS — M9904 Segmental and somatic dysfunction of sacral region: Secondary | ICD-10-CM | POA: Diagnosis not present

## 2021-10-31 DIAGNOSIS — M533 Sacrococcygeal disorders, not elsewhere classified: Secondary | ICD-10-CM

## 2021-10-31 DIAGNOSIS — M9902 Segmental and somatic dysfunction of thoracic region: Secondary | ICD-10-CM | POA: Diagnosis not present

## 2021-10-31 DIAGNOSIS — M9908 Segmental and somatic dysfunction of rib cage: Secondary | ICD-10-CM

## 2021-10-31 NOTE — Assessment & Plan Note (Signed)
Still had significant tightness noted.  Discussed with patient again about icing regimen and home exercises.  Overall has made some improvement but is still having difficulty and is trying to find different medications.  Working with the [facility] as well.  Follow-up with me again in 6 to 8 weeks

## 2021-10-31 NOTE — Patient Instructions (Signed)
See me in 5-8 weeks

## 2021-11-14 ENCOUNTER — Other Ambulatory Visit (HOSPITAL_COMMUNITY): Payer: Self-pay

## 2021-11-18 ENCOUNTER — Other Ambulatory Visit: Payer: Self-pay | Admitting: Family Medicine

## 2021-11-18 ENCOUNTER — Encounter: Payer: Self-pay | Admitting: Physical Medicine and Rehabilitation

## 2021-11-18 ENCOUNTER — Encounter: Payer: Self-pay | Admitting: Internal Medicine

## 2021-11-19 ENCOUNTER — Other Ambulatory Visit (HOSPITAL_COMMUNITY): Payer: Self-pay

## 2021-11-19 MED ORDER — TRAMADOL HCL 50 MG PO TABS
50.0000 mg | ORAL_TABLET | Freq: Two times a day (BID) | ORAL | 0 refills | Status: DC | PRN
  Filled 2021-11-19: qty 30, 15d supply, fill #0

## 2021-11-20 ENCOUNTER — Encounter: Payer: Self-pay | Admitting: Internal Medicine

## 2021-11-20 NOTE — Progress Notes (Unsigned)
Subjective:    Patient ID: [name], female    DOB: 24-May-1975, 46 y.o.   MRN: [id number]     HPI [name] is here for follow up of her chronic medical problems, including htn, hypothyroid, GERD, recurrent UTI, prediabetes, chronic pain syndrome, fibromyalgia  Has current flare of fibromyalgia - stared about two weeks ago - fatigue, aches all over.   Has not been doing chronic PT which may be making it worse.  Seeing Dr Dagoberto Ligas - Taking lyrica.  She may start trigger point injections for her myofascial pain.      Medications and allergies reviewed with patient and updated if appropriate.  Current Outpatient Medications on File Prior to Visit  Medication Sig Dispense Refill   acetaminophen (TYLENOL) 500 MG tablet Take 1,000 mg by mouth every 6 (six) hours as needed for mild pain or headache.     Acetaminophen-Caffeine (EXCEDRIN TENSION HEADACHE) 500-65 MG TABS Take 2 tablets by mouth daily as needed (headache).     amLODipine (NORVASC) 5 MG tablet Take 1 tablet (5 mg total) by mouth daily. 90 tablet 1   Cholecalciferol (VITAMIN D3) 2000 units TABS Take 2,000 Units by mouth daily.     docusate sodium (COLACE) 100 MG capsule Take 100 mg by mouth daily.     fluticasone (FLONASE) 50 MCG/ACT nasal spray Place 2 sprays into both nostrils daily. 16 g 6   levothyroxine (SYNTHROID) 50 MCG tablet Take 1 tablet (50 mcg total) by mouth daily. 90 tablet 0   multivitamin-lutein (OCUVITE-LUTEIN) CAPS capsule Take 1 capsule by mouth daily.     nitrofurantoin (MACRODANTIN) 50 MG capsule Take as directed after intercourse 45 capsule 0   omeprazole (PRILOSEC) 20 MG capsule Take 20 mg by mouth daily.     Prasterone, DHEA, (DHEA ADVANCED FORMULA PO) Take 1 tablet by mouth daily.      pregabalin (LYRICA) 50 MG capsule Take 1 capsule (50 mg total) by mouth 2 (two) times daily for 7 days, THEN can increase to 2 capsules (100 mg total) 2 (two) times daily for fibromyalgia 120 capsule 5    Propylene Glycol (SYSTANE COMPLETE OP) Place 1 drop into both eyes daily.     tiZANidine (ZANAFLEX) 4 MG tablet Take 1 tablet (4 mg total) by mouth at bedtime. 30 tablet 0   traMADol (ULTRAM) 50 MG tablet Take 1 tablet (50 mg total) by mouth every 12 (twelve) hours as needed for moderate pain 30 tablet 0   Vilazodone HCl (VIIBRYD) 20 MG TABS Take 1 tablet (20 mg total) by mouth daily. 90 tablet 0   No current facility-administered medications on file prior to visit.     Review of Systems  Constitutional:  Negative for chills and fever.  Respiratory:  Negative for cough, shortness of breath and wheezing.   Cardiovascular:  Negative for chest pain, palpitations and leg swelling.  Musculoskeletal:  Positive for back pain and myalgias.  Neurological:  Positive for headaches (occ). Negative for dizziness and light-headedness.       Objective:   Vitals:   11/21/21 1423  BP: 102/72  Pulse: 82  Temp: 98.3 F (36.8 C)  SpO2: 98%   BP Readings from Last 3 Encounters:  11/21/21 102/72  10/31/21 104/74  09/19/21 129/85   Wt Readings from Last 3 Encounters:  11/21/21 145 lb (65.8 kg)  10/31/21 143 lb (64.9 kg)  09/19/21 143 lb 12.8 oz (65.2 kg)   Body mass index is 25.69 kg/m.  Physical Exam Constitutional:      General: She is not in acute distress.    Appearance: Normal appearance.  HENT:     Head: Normocephalic and atraumatic.  Eyes:     Conjunctiva/sclera: Conjunctivae normal.  Cardiovascular:     Rate and Rhythm: Normal rate and regular rhythm.     Heart sounds: Normal heart sounds. No murmur heard. Pulmonary:     Effort: Pulmonary effort is normal. No respiratory distress.     Breath sounds: Normal breath sounds. No wheezing.  Musculoskeletal:     Cervical back: Neck supple.     Right lower leg: No edema.     Left lower leg: No edema.  Lymphadenopathy:     Cervical: No cervical adenopathy.  Skin:    General: Skin is warm and dry.     Findings: No rash.   Neurological:     Mental Status: She is alert. Mental status is at baseline.  Psychiatric:        Mood and Affect: Mood normal.        Behavior: Behavior normal.        Lab Results  Component Value Date   WBC 9.4 10/04/2020   HGB 13.2 10/04/2020   HCT 39.4 10/04/2020   PLT 297.0 10/04/2020   GLUCOSE 83 10/04/2020   CHOL 191 10/04/2020   TRIG 95.0 10/04/2020   HDL 54.20 10/04/2020   LDLCALC 118 (H) 10/04/2020   ALT 15 10/04/2020   AST 17 10/04/2020   NA 138 10/04/2020   K 3.7 10/04/2020   CL 102 10/04/2020   CREATININE 0.85 10/04/2020   BUN 19 10/04/2020   CO2 30 10/04/2020   TSH 1.07 10/04/2020   HGBA1C 5.7 10/04/2020     Assessment & Plan:    See Problem List for Assessment and Plan of chronic medical problems.

## 2021-11-20 NOTE — Patient Instructions (Addendum)
     Blood work was ordered.     Medications changes include :      Your prescription(s) have been sent to your pharmacy.    A referral was ordered for XX.     Someone from that office will call you to schedule an appointment.    Return in about 6 months (around 05/22/2022) for Physical Exam.

## 2021-11-21 ENCOUNTER — Ambulatory Visit: Payer: 59 | Admitting: Internal Medicine

## 2021-11-21 VITALS — BP 102/72 | HR 82 | Temp 98.3°F | Ht 63.0 in | Wt 145.0 lb

## 2021-11-21 DIAGNOSIS — K219 Gastro-esophageal reflux disease without esophagitis: Secondary | ICD-10-CM

## 2021-11-21 DIAGNOSIS — I10 Essential (primary) hypertension: Secondary | ICD-10-CM

## 2021-11-21 DIAGNOSIS — E039 Hypothyroidism, unspecified: Secondary | ICD-10-CM

## 2021-11-21 DIAGNOSIS — G894 Chronic pain syndrome: Secondary | ICD-10-CM

## 2021-11-21 DIAGNOSIS — M7918 Myalgia, other site: Secondary | ICD-10-CM | POA: Diagnosis not present

## 2021-11-21 DIAGNOSIS — N390 Urinary tract infection, site not specified: Secondary | ICD-10-CM

## 2021-11-21 DIAGNOSIS — M797 Fibromyalgia: Secondary | ICD-10-CM

## 2021-11-21 DIAGNOSIS — R7303 Prediabetes: Secondary | ICD-10-CM | POA: Diagnosis not present

## 2021-11-21 LAB — COMPREHENSIVE METABOLIC PANEL
ALT: 12 U/L (ref 0–35)
AST: 14 U/L (ref 0–37)
Albumin: 4.2 g/dL (ref 3.5–5.2)
Alkaline Phosphatase: 86 U/L (ref 39–117)
BUN: 21 mg/dL (ref 6–23)
CO2: 29 mEq/L (ref 19–32)
Calcium: 9 mg/dL (ref 8.4–10.5)
Chloride: 103 mEq/L (ref 96–112)
Creatinine, Ser: 0.81 mg/dL (ref 0.40–1.20)
GFR: 85.81 mL/min (ref >60.00)
Glucose, Bld: 94 mg/dL (ref 70–99)
Potassium: 3.5 mEq/L (ref 3.5–5.1)
Sodium: 139 mEq/L (ref 135–145)
Total Bilirubin: 0.3 mg/dL (ref 0.2–1.2)
Total Protein: 6.9 g/dL (ref 6.0–8.3)

## 2021-11-21 LAB — TSH: TSH: 1.74 u[IU]/mL (ref 0.35–5.50)

## 2021-11-21 LAB — CBC WITH DIFFERENTIAL/PLATELET
Basophils Absolute: 0.1 10*3/uL (ref 0.0–0.1)
Basophils Relative: 1 % (ref 0.0–3.0)
Eosinophils Absolute: 0.6 10*3/uL (ref 0.0–0.7)
Eosinophils Relative: 5.7 % — ABNORMAL HIGH (ref 0.0–5.0)
HCT: 40.1 % (ref 36.0–46.0)
Hemoglobin: 13.6 g/dL (ref 12.0–15.0)
Lymphocytes Relative: 41.1 % (ref 12.0–46.0)
Lymphs Abs: 4.1 10*3/uL — ABNORMAL HIGH (ref 0.7–4.0)
MCHC: 33.8 g/dL (ref 30.0–36.0)
MCV: 92.8 fl (ref 78.0–100.0)
Monocytes Absolute: 0.6 10*3/uL (ref 0.1–1.0)
Monocytes Relative: 5.5 % (ref 3.0–12.0)
Neutro Abs: 4.6 10*3/uL (ref 1.4–7.7)
Neutrophils Relative %: 46.7 % (ref 43.0–77.0)
Platelets: 263 10*3/uL (ref 150.0–400.0)
RBC: 4.32 Mil/uL (ref 3.87–5.11)
RDW: 12.7 % (ref 11.5–15.5)
WBC: 10 10*3/uL (ref 4.0–10.5)

## 2021-11-21 LAB — HEMOGLOBIN A1C: Hgb A1c MFr Bld: 5.7 % (ref 4.6–6.5)

## 2021-11-21 NOTE — Assessment & Plan Note (Signed)
Chronic  Clinically euthyroid Check tsh and will titrate med dose if needed Currently taking levothyroxine 50 mcg daily 

## 2021-11-21 NOTE — Assessment & Plan Note (Addendum)
Chronic Myofascial pain and fibromyalgia Following with Dr Tamala Julian and Dr lovorn Taking lyrica, tizanidine 4 mg at bed and tramadol as needed which is not often Will likely be starting trigger injections Continue tramadol 50 mg daily prn

## 2021-11-21 NOTE — Assessment & Plan Note (Signed)
Chronic GERD controlled Continue omeprazole 20 mg daily  

## 2021-11-21 NOTE — Assessment & Plan Note (Signed)
Chronic Has medullary sponge kidney disease Frequent UTIs Continue nitrofurantoin 50 mg after intercourse for prevention

## 2021-11-21 NOTE — Assessment & Plan Note (Signed)
Chronic Check a1c Low sugar / carb diet Stressed regular exercise  

## 2021-11-21 NOTE — Assessment & Plan Note (Addendum)
Chronic Blood pressure well controlled - a little She will monitor it - no chang in medication today CMP Continue amlodipine 5 mg daily

## 2021-11-28 ENCOUNTER — Other Ambulatory Visit: Payer: Self-pay | Admitting: Obstetrics & Gynecology

## 2021-11-28 ENCOUNTER — Other Ambulatory Visit: Payer: Self-pay | Admitting: Internal Medicine

## 2021-11-28 DIAGNOSIS — Z1231 Encounter for screening mammogram for malignant neoplasm of breast: Secondary | ICD-10-CM

## 2021-12-11 NOTE — Progress Notes (Signed)
[name] [address] [place] Phone: [phone] Subjective:   [name], am serving as a scribe for Dr. Hulan Saas.  I'm seeing this patient by the request  of:  Binnie Rail, MD  CC: Back and neck pain follow-up  [id number]  [name] is a 46 y.o. female coming in with complaint of back and neck pain. OMT on 10/31/2021. Patient states about the same. Having some fibromyalgia flares recently. No other issues.  Medications patient has been prescribed:   Taking:         Reviewed prior external information including notes and imaging from previsou exam, outside providers and external EMR if available.   As well as notes that were available from care everywhere and other healthcare systems.  Past medical history, social, surgical and family history all reviewed in electronic medical record.  No pertanent information unless stated regarding to the chief complaint.   Past Medical History:  Diagnosis Date   Allergy    Autoimmune disorder ([place])    Crohn's disease ([place])    Endometriosis    Hypertension    Kidney, medullary sponge    Ovarian cyst    Pre-eclampsia    Raynaud's disease    Stress incontinence in female     Allergies  Allergen Reactions   Lamisil [Terbinafine] Hives and Itching    In pill form     Review of Systems:  No visual changes, nausea, vomiting, diarrhea, constipation, dizziness, abdominal pain, skin rash, fevers, chills, night sweats, weight loss, swollen lymph nodes, joint swelling, chest pain, shortness of breath, mood changes. POSITIVE muscle aches, body aches, headache  Objective  Blood pressure 118/78, pulse 89, height '5\' 3"'$  (1.6 m), weight 148 lb (67.1 kg), SpO2 99 %.   General: No apparent distress alert and oriented x3 mood and affect normal, dressed appropriately.  HEENT: Pupils equal, extraocular movements intact  Respiratory: Patient's speak in full sentences  and does not appear short of breath  Cardiovascular: No lower extremity edema, non tender, no erythema  Gait normal but cautious MSK:  Back significant more tightness noted and more pain in the musculature overall.  Seems to be more in the myofascial areas than anywhere else.  Tightness noted throughout the axial spine.  Osteopathic findings  C2 flexed rotated and side bent right C6 flexed rotated and side bent left T3 extended rotated and side bent right inhaled rib T9 extended rotated and side bent left L2 flexed rotated and side bent right Sacrum right on right       Assessment and Plan:  Fibromyalgia Do believe the patient is having more of an exacerbation.  Likely did get a lot of movement with osteopathic manipulation is one of the treatments today.  In addition to this we did discuss with patient about the Zanaflex 4 mg daily at bedtime and may be using it more regularly as well as tramadol for breakthrough pain.  This is the 50 mg.  Can take it up to every 12 hours but she is using it significantly less.  Patient is continuing on the vilazodone.  Encouraged her to continue to work on the stretches and stay active and follow-up again in 6 to 8 weeks for further evaluation and management    Nonallopathic problems  Decision today to treat with OMT was based on Physical Exam  After verbal consent patient was treated with HVLA, ME, FPR techniques in cervical, rib, thoracic, lumbar, and  sacral  areas  Patient tolerated the procedure well with improvement in symptoms  Patient given exercises, stretches and lifestyle modifications  See medications in patient instructions if given  Patient will follow up in 4-8 weeks    The above documentation has been reviewed and is accurate and complete Lyndal Pulley, DO          Note: This dictation was prepared with Dragon dictation along with smaller phrase technology. Any transcriptional errors that result from this process are  unintentional.

## 2021-12-12 ENCOUNTER — Ambulatory Visit: Payer: 59 | Admitting: Family Medicine

## 2021-12-12 VITALS — BP 118/78 | HR 89 | Ht 63.0 in | Wt 148.0 lb

## 2021-12-12 DIAGNOSIS — M9902 Segmental and somatic dysfunction of thoracic region: Secondary | ICD-10-CM

## 2021-12-12 DIAGNOSIS — M9908 Segmental and somatic dysfunction of rib cage: Secondary | ICD-10-CM

## 2021-12-12 DIAGNOSIS — M9901 Segmental and somatic dysfunction of cervical region: Secondary | ICD-10-CM

## 2021-12-12 DIAGNOSIS — M9904 Segmental and somatic dysfunction of sacral region: Secondary | ICD-10-CM

## 2021-12-12 DIAGNOSIS — M9903 Segmental and somatic dysfunction of lumbar region: Secondary | ICD-10-CM

## 2021-12-12 DIAGNOSIS — M797 Fibromyalgia: Secondary | ICD-10-CM

## 2021-12-12 NOTE — Patient Instructions (Signed)
Good to see you! Have a good weekend Keep working on exercises

## 2021-12-12 NOTE — Assessment & Plan Note (Signed)
Do believe the patient is having more of an exacerbation.  Likely did get a lot of movement with osteopathic manipulation is one of the treatments today.  In addition to this we did discuss with patient about the Zanaflex 4 mg daily at bedtime and may be using it more regularly as well as tramadol for breakthrough pain.  This is the 50 mg.  Can take it up to every 12 hours but she is using it significantly less.  Patient is continuing on the vilazodone.  Encouraged her to continue to work on the stretches and stay active and follow-up again in 6 to 8 weeks for further evaluation and management

## 2021-12-15 ENCOUNTER — Other Ambulatory Visit (HOSPITAL_COMMUNITY): Payer: Self-pay

## 2021-12-15 ENCOUNTER — Encounter: Payer: Self-pay | Admitting: Family Medicine

## 2021-12-15 MED ORDER — PREDNISONE 50 MG PO TABS
50.0000 mg | ORAL_TABLET | Freq: Every day | ORAL | 0 refills | Status: DC
  Filled 2021-12-15: qty 5, 5d supply, fill #0

## 2021-12-16 ENCOUNTER — Other Ambulatory Visit (HOSPITAL_COMMUNITY): Payer: Self-pay

## 2021-12-21 ENCOUNTER — Other Ambulatory Visit: Payer: Self-pay | Admitting: Family Medicine

## 2021-12-22 ENCOUNTER — Other Ambulatory Visit (HOSPITAL_COMMUNITY): Payer: Self-pay

## 2021-12-22 DIAGNOSIS — M25512 Pain in left shoulder: Secondary | ICD-10-CM | POA: Diagnosis not present

## 2021-12-22 DIAGNOSIS — M797 Fibromyalgia: Secondary | ICD-10-CM | POA: Diagnosis not present

## 2021-12-22 DIAGNOSIS — M545 Low back pain, unspecified: Secondary | ICD-10-CM | POA: Diagnosis not present

## 2021-12-23 ENCOUNTER — Other Ambulatory Visit (HOSPITAL_COMMUNITY): Payer: Self-pay

## 2021-12-23 MED ORDER — LEVOTHYROXINE SODIUM 50 MCG PO TABS
50.0000 ug | ORAL_TABLET | Freq: Every day | ORAL | 0 refills | Status: DC
  Filled 2021-12-23: qty 90, 90d supply, fill #0

## 2021-12-26 ENCOUNTER — Ambulatory Visit
Admission: RE | Admit: 2021-12-26 | Discharge: 2021-12-26 | Disposition: A | Discharge status: Home / Self Care | Payer: 59 | Source: Ambulatory Visit | Attending: Obstetrics & Gynecology | Admitting: Obstetrics & Gynecology

## 2021-12-26 DIAGNOSIS — Z1231 Encounter for screening mammogram for malignant neoplasm of breast: Secondary | ICD-10-CM

## 2021-12-31 DIAGNOSIS — M25512 Pain in left shoulder: Secondary | ICD-10-CM | POA: Diagnosis not present

## 2021-12-31 DIAGNOSIS — M545 Low back pain, unspecified: Secondary | ICD-10-CM | POA: Diagnosis not present

## 2021-12-31 DIAGNOSIS — M797 Fibromyalgia: Secondary | ICD-10-CM | POA: Diagnosis not present

## 2022-01-02 ENCOUNTER — Other Ambulatory Visit: Payer: Self-pay | Admitting: Family Medicine

## 2022-01-05 ENCOUNTER — Other Ambulatory Visit (HOSPITAL_COMMUNITY): Payer: Self-pay

## 2022-01-05 MED ORDER — VILAZODONE HCL 20 MG PO TABS
1.0000 | ORAL_TABLET | Freq: Every day | ORAL | 0 refills | Status: DC
  Filled 2022-01-05: qty 30, 30d supply, fill #0
  Filled 2022-02-03: qty 30, 30d supply, fill #1
  Filled 2022-03-04: qty 30, 30d supply, fill #2

## 2022-01-06 ENCOUNTER — Other Ambulatory Visit (HOSPITAL_COMMUNITY): Payer: Self-pay

## 2022-01-07 ENCOUNTER — Other Ambulatory Visit (HOSPITAL_COMMUNITY): Payer: Self-pay

## 2022-01-07 DIAGNOSIS — M25512 Pain in left shoulder: Secondary | ICD-10-CM | POA: Diagnosis not present

## 2022-01-07 DIAGNOSIS — M545 Low back pain, unspecified: Secondary | ICD-10-CM | POA: Diagnosis not present

## 2022-01-07 DIAGNOSIS — M797 Fibromyalgia: Secondary | ICD-10-CM | POA: Diagnosis not present

## 2022-01-14 DIAGNOSIS — M797 Fibromyalgia: Secondary | ICD-10-CM | POA: Diagnosis not present

## 2022-01-14 DIAGNOSIS — M25512 Pain in left shoulder: Secondary | ICD-10-CM | POA: Diagnosis not present

## 2022-01-14 DIAGNOSIS — M545 Low back pain, unspecified: Secondary | ICD-10-CM | POA: Diagnosis not present

## 2022-01-16 ENCOUNTER — Encounter: Payer: Self-pay | Admitting: Physical Medicine and Rehabilitation

## 2022-01-16 ENCOUNTER — Other Ambulatory Visit (HOSPITAL_COMMUNITY): Payer: Self-pay

## 2022-01-16 ENCOUNTER — Encounter: Payer: 59 | Attending: Physical Medicine and Rehabilitation | Admitting: Physical Medicine and Rehabilitation

## 2022-01-16 VITALS — BP 127/85 | HR 78 | Ht 63.0 in | Wt 147.4 lb

## 2022-01-16 DIAGNOSIS — M797 Fibromyalgia: Secondary | ICD-10-CM | POA: Insufficient documentation

## 2022-01-16 DIAGNOSIS — M7918 Myalgia, other site: Secondary | ICD-10-CM | POA: Insufficient documentation

## 2022-01-16 DIAGNOSIS — G894 Chronic pain syndrome: Secondary | ICD-10-CM | POA: Diagnosis not present

## 2022-01-16 MED ORDER — PREGABALIN 100 MG PO CAPS
100.0000 mg | ORAL_CAPSULE | Freq: Two times a day (BID) | ORAL | 5 refills | Status: DC
  Filled 2022-01-16: qty 60, 30d supply, fill #0
  Filled 2022-02-24: qty 60, 30d supply, fill #1
  Filled 2022-03-23: qty 60, 30d supply, fill #2

## 2022-01-16 MED ORDER — LIDOCAINE HCL 1 % IJ SOLN
6.0000 mL | Freq: Once | INTRAMUSCULAR | Status: AC
  Administered 2022-01-16: 6 mL

## 2022-01-16 NOTE — Patient Instructions (Signed)
Pt is a 46 yr old female with dx of Fibromyalgia, HTN, and hypothyroidism- last TSH 1.07;  Has medullar sponge kidney.  Here for evaluation of fibromyalgia- got dx 10/21- but Sx's since 2017. Also has myofascial pain syndrome- Here for f/u on Fibromyalgia and poor sleep.     Will change Lyrica to 100 mg BID for nerve pain- from    2.  I suggest melatonin for very shorts term uses- ~ 3 days- to get back into pattern.   Patient here for trigger point injections for  Consent done and on chart.  Cleaned areas with alcohol and injected using a 27 gauge 1.5 inch needle  Injected 3cc- wasted 3cc Using 1% Lidocaine with no EPI  Upper traps B/L  Levators Posterior scalenes Middle scalenes- B/L  Splenius Capitus Pectoralis Major- B/L  Rhomboids=- B/L  Infraspinatus Teres Major/minor Thoracic paraspinals Lumbar paraspinals Other injections- 1st dorsal interossei B/L    Patient's level of pain prior was 6-7/10 Current level of pain after injections is- 5-6/10- a little looser as well   There was no bleeding or complications.  Patient was advised to drink a lot of water on day after injections to flush system Will have increased soreness for 12-48 hours after injections.  Can use Lidocaine patches the day AFTER injections Can use theracane on day of injections in places didn't inject Can use heating pad or ice 4-6 hours AFTER injections   3. Work on [employer]- at least 2x/week- works easier ot maintain relaxation than to go try to relax muscles themselves without trigger point injections.

## 2022-01-16 NOTE — Progress Notes (Signed)
Subjective:    Patient ID: [name], female    DOB: [birth year], 46 y.o.   MRN: [id number]  HPI  Pt is a 46 yr old female with dx of Fibromyalgia, HTN, and hypothyroidism- last TSH 1.07;  Has medullar sponge kidney.  Here for evaluation of fibromyalgia- got dx 10/21- but Sx's since 2017. Also has myofascial pain syndrome- Here for f/u on Fibromyalgia and to get Trigger point injections.    Sees Dr Hulan Saas every 5-6 weeks-  Heading towards a flare- muscles are fatigued and aching and "not great".  Interested in TrP injections Not sleeping well- On Vibryd  Tends to sleep well until 3-4 am- but then drifting in/out after that.  Dr Tamala Julian prescribes Vibryd-   Doing OK on Lyrica- had backed down on dose- but not back up to 2 pills/2/day-   Takes slow-Mg and 500 mg at night- and was having cramping on it BID- still helping with constipation, but no cramping anymore   Taking [place] Skullcap- from flower for sleep And has tried tart cherry- and takes Pure Z's- but doesn't have melatonin- has valerian root. Usually works pretty well.   Monthly massages scheduled monthly Back to PT for core strength and back pain. And [facility]- doesn't think helping that much- doing manual myofascial release- short term slightly helpful, but not as much long term.   Pain Inventory Average Pain 3 Pain Right Now 5 My pain is constant, burning, dull, and aching  In the last 24 hours, has pain interfered with the following? General activity 7 Relation with others 0 Enjoyment of life 8 What TIME of day is your pain at its worst? evening Sleep (in general) Poor  Pain is worse with:  . Pain improves with: rest and medication Relief from Meds: 7  Family History  Problem Relation Age of Onset   Breast cancer Mother    Hypertension Father    Hyperlipidemia Father    Other Brother        lyme-chronic   Stroke Maternal Grandmother    Alcohol abuse Paternal Grandmother    Heart  disease Paternal Grandfather    Colon cancer Neg Hx    Esophageal cancer Neg Hx    Rectal cancer Neg Hx    Social History   Socioeconomic History   Marital status: Married    Spouse name: Not on file   Number of children: 3   Years of education: Not on file   Highest education level: Not on file  Occupational History   Occupation: Programmer, multimedia: [employer]  Tobacco Use   Smoking status: Never   Smokeless tobacco: Never  Vaping Use   Vaping Use: Never used  Substance and Sexual Activity   Alcohol use: Yes   Drug use: No   Sexual activity: Yes  Other Topics Concern   Not on file  Social History Narrative   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     Past Surgical History:  Procedure Laterality Date   CHOLECYSTECTOMY     DILATION AND CURETTAGE OF UTERUS     ENDOMETRIAL ABLATION     NOVASURE ABLATION     TUBAL LIGATION     Past Medical History:  Diagnosis Date   Allergy    Autoimmune disorder ([place])    Crohn's disease ([place])    Endometriosis    Hypertension    Kidney, medullary sponge    Ovarian cyst    Pre-eclampsia    Raynaud's disease    Stress incontinence in female    Ht '5\' 3"'$  (1.6 m)   Wt 147 lb 6.4 oz (66.9 kg)   BMI 26.11 kg/m   Opioid Risk Score:   Fall Risk Score:  `1  Depression screen [facility] 2/9     11/21/2021    2:35 PM 09/19/2021    2:05 PM 06/02/2021   10:58 AM 04/25/2021    1:53 PM 06/07/2020    8:17 AM 08/03/2018    7:41 AM 02/18/2017   10:45 AM  Depression screen PHQ 2/9  Decreased Interest 0 0 0 0 0 0 0  Down, Depressed,  Hopeless 0 0 1 0 0 0 0  PHQ - 2 Score 0 0 1 0 0 0 0  Altered sleeping   2   0   Tired, decreased energy   2   1   Change in appetite   0   0   Feeling bad or failure about yourself    0   0   Trouble concentrating   0   0   Moving slowly or fidgety/restless   0   0   Suicidal thoughts   0   0   PHQ-9 Score   5   1   Difficult doing work/chores   Somewhat difficult          Review of Systems  Musculoskeletal:  Positive for back pain and neck pain.       B/L hand pain B/L foot pain   All other systems reviewed and are negative.     Objective:   Physical Exam  Awake, alert, low energy; flat affect; NAD Cold- has coat over herself.  Trigger points in shoulders, upper back, neck and pecs.       Assessment & Plan:   Pt is a 46 yr old female with dx of Fibromyalgia, HTN, and hypothyroidism- last TSH 1.07;  Has medullar sponge kidney.  Here for evaluation of fibromyalgia- got dx 10/21- but Sx's since 2017. Also has myofascial pain syndrome- Here for f/u on Fibromyalgia and poor sleep.     Will change Lyrica to 100 mg BID for nerve pain- from    2.  I suggest melatonin for very shorts term uses- ~ 3 days- to get back into pattern.   Patient here for trigger point injections for  Consent done and on chart.  Cleaned areas with alcohol and injected using a 27 gauge 1.5 inch needle  Injected 3cc- wasted 3cc Using 1% Lidocaine with no EPI  Upper traps B/L  Levators Posterior scalenes Middle scalenes- B/L  Splenius Capitus Pectoralis Major- B/L  Rhomboids=- B/L  Infraspinatus Teres Major/minor Thoracic paraspinals Lumbar paraspinals Other injections- 1st dorsal interossei B/L    Patient's level of pain prior was 6-7/10 Current level of pain after injections is- 5-6/10- a little looser as well   There was no bleeding or complications.  Patient was advised to drink a lot of water on day after  injections to flush system Will have increased soreness for 12-48 hours  after injections.  Can use Lidocaine patches the day AFTER injections Can use theracane on day of injections in places didn't inject Can use heating pad or ice 4-6 hours AFTER injections   3. Work on [employer]- at least 2x/week- works easier ot maintain relaxation than to go try to relax muscles themselves without trigger point injections.    I spent a total of  25  minutes on total care today- >50% coordination of care- due to  10 minutes on injections- res ton f/u as detailed above.

## 2022-01-21 DIAGNOSIS — M797 Fibromyalgia: Secondary | ICD-10-CM | POA: Diagnosis not present

## 2022-01-21 DIAGNOSIS — M25512 Pain in left shoulder: Secondary | ICD-10-CM | POA: Diagnosis not present

## 2022-01-21 DIAGNOSIS — M545 Low back pain, unspecified: Secondary | ICD-10-CM | POA: Diagnosis not present

## 2022-01-22 NOTE — Progress Notes (Unsigned)
  [place] Phone: [phone] Subjective:   [name], am serving as a scribe for Dr. Hulan Saas.  I'm seeing this patient by the request  of:  Lorraine Rail, MD  CC: back and neck pain   [id number]  [name] is a 46 y.o. female coming in with complaint of back and neck pain. OMT 12/12/2021. Patient states that she has good and bad days.   Medications patient has been prescribed: Zanaflex, Synthroid, Viibryd  Taking:         Reviewed prior external information including notes and imaging from previsou exam, outside providers and external EMR if available.   As well as notes that were available from care everywhere and other healthcare systems.  Past medical history, social, surgical and family history all reviewed in electronic medical record.  [name] unless stated regarding to the chief complaint.   Past Medical History:  Diagnosis Date   Allergy    Autoimmune disorder ([place])    Crohn's disease ([place])    Endometriosis    Hypertension    Kidney, medullary sponge    Ovarian cyst    Pre-eclampsia    Raynaud's disease    Stress incontinence in female     Allergies  Allergen Reactions   Lamisil [Terbinafine] Hives and Itching    In pill form     Review of Systems:  [name] headache, visual changes, nausea, vomiting, diarrhea, constipation, dizziness, abdominal pain, skin rash, fevers, chills, night sweats, weight loss, swollen lymph nodes, body aches, joint swelling, chest pain, shortness of breath, mood changes. POSITIVE muscle aches  Objective  There were [name] vitals taken for this visit.   General: [name] apparent distress alert and oriented x3 mood and affect normal, dressed appropriately.  HEENT: Pupils equal, extraocular movements intact  Respiratory: Patient's speak in full sentences and does not appear short of breath  Cardiovascular: [name] lower extremity  edema, non tender, [name] erythema  Gait MSK:  Back   Osteopathic findings  C2 flexed rotated and side bent right C6 flexed rotated and side bent left T3 extended rotated and side bent right inhaled rib T9 extended rotated and side bent left L2 flexed rotated and side bent right Sacrum right on right       Assessment and Plan:  [name] problem-specific Assessment & Plan notes found for this encounter.    Nonallopathic problems  Decision today to treat with OMT was based on Physical Exam  After verbal consent patient was treated with HVLA, ME, FPR techniques in cervical, rib, thoracic, lumbar, and sacral  areas  Patient tolerated the procedure well with improvement in symptoms  Patient given exercises, stretches and lifestyle modifications  See medications in patient instructions if given  Patient will follow up in 4-8 weeks     The above documentation has been reviewed and is accurate and complete Lorraine Pulley, DO         Note: This dictation was prepared with Dragon dictation along with smaller phrase technology. Any transcriptional errors that result from this process are unintentional.

## 2022-01-23 ENCOUNTER — Ambulatory Visit: Payer: 59 | Admitting: Family Medicine

## 2022-01-23 ENCOUNTER — Other Ambulatory Visit (HOSPITAL_COMMUNITY): Payer: Self-pay

## 2022-01-23 VITALS — BP 122/82 | HR 80 | Ht 63.0 in | Wt 147.0 lb

## 2022-01-23 DIAGNOSIS — M7918 Myalgia, other site: Secondary | ICD-10-CM | POA: Diagnosis not present

## 2022-01-23 DIAGNOSIS — M9904 Segmental and somatic dysfunction of sacral region: Secondary | ICD-10-CM

## 2022-01-23 DIAGNOSIS — M533 Sacrococcygeal disorders, not elsewhere classified: Secondary | ICD-10-CM | POA: Diagnosis not present

## 2022-01-23 DIAGNOSIS — M9908 Segmental and somatic dysfunction of rib cage: Secondary | ICD-10-CM | POA: Diagnosis not present

## 2022-01-23 DIAGNOSIS — M9901 Segmental and somatic dysfunction of cervical region: Secondary | ICD-10-CM

## 2022-01-23 DIAGNOSIS — Z01419 Encounter for gynecological examination (general) (routine) without abnormal findings: Secondary | ICD-10-CM | POA: Diagnosis not present

## 2022-01-23 DIAGNOSIS — M9903 Segmental and somatic dysfunction of lumbar region: Secondary | ICD-10-CM | POA: Diagnosis not present

## 2022-01-23 DIAGNOSIS — Z6825 Body mass index (BMI) 25.0-25.9, adult: Secondary | ICD-10-CM | POA: Diagnosis not present

## 2022-01-23 DIAGNOSIS — M9902 Segmental and somatic dysfunction of thoracic region: Secondary | ICD-10-CM

## 2022-01-23 MED ORDER — TIZANIDINE HCL 4 MG PO TABS
4.0000 mg | ORAL_TABLET | Freq: Every day | ORAL | 0 refills | Status: DC
  Filled 2022-01-23: qty 30, 30d supply, fill #0

## 2022-01-23 MED ORDER — MONTELUKAST SODIUM 10 MG PO TABS
10.0000 mg | ORAL_TABLET | Freq: Every day | ORAL | 1 refills | Status: DC
  Filled 2022-01-23: qty 90, 90d supply, fill #0
  Filled 2022-06-10: qty 90, 90d supply, fill #1

## 2022-01-23 NOTE — Assessment & Plan Note (Signed)
Singulair  given for this time a year  Discussed the vitamin supplementations again including the vitamin D.  Patient will continue to be active.  Continue to monitor.  Follow-up again in 6 to 8 weeks

## 2022-01-23 NOTE — Patient Instructions (Addendum)
Zanaflex refilled Singulair refilled  See me in 7-8 weeks

## 2022-01-23 NOTE — Assessment & Plan Note (Signed)
Doing relatively well  Discussed HEP  Discussed which activitiesd to do .

## 2022-01-24 ENCOUNTER — Encounter: Payer: Self-pay | Admitting: Family Medicine

## 2022-02-03 ENCOUNTER — Other Ambulatory Visit (HOSPITAL_COMMUNITY): Payer: Self-pay

## 2022-02-04 DIAGNOSIS — M25512 Pain in left shoulder: Secondary | ICD-10-CM | POA: Diagnosis not present

## 2022-02-04 DIAGNOSIS — M545 Low back pain, unspecified: Secondary | ICD-10-CM | POA: Diagnosis not present

## 2022-02-04 DIAGNOSIS — M797 Fibromyalgia: Secondary | ICD-10-CM | POA: Diagnosis not present

## 2022-02-05 NOTE — Progress Notes (Signed)
Benito Mccreedy D.[place] Phone: [phone]   Assessment and Plan:     1. Myofascial pain syndrome 2. Neck pain 3. Strain of right trapezius muscle, subsequent encounter 4. Somatic dysfunction of cervical region 5. Somatic dysfunction of thoracic region 6. Somatic dysfunction of rib region  -Chronic with exacerbation, subsequent sports medicine visit - Recurrence of multiple musculoskeletal complaints with most prominent being in right-sided neck and right trapezius.  These have responded well to OMT in the past and patient elected for repeat OMT -- Patient has received significant relief with OMT in the past.  Elects for repeat OMT today.  Tolerated well per note below. - Decision today to treat with OMT was based on Physical Exam   After verbal consent patient was treated with HVLA (high velocity low amplitude), ME (muscle energy), FPR (flex positional release), ST (soft tissue), PC/PD (Pelvic Compression/ Pelvic Decompression) techniques in cervical, rib, thoracic, areas. Patient tolerated the procedure well with improvement in symptoms.  Patient educated on potential side effects of soreness and recommended to rest, hydrate, and use Tylenol as needed for pain control.   Pertinent previous records reviewed include none   Follow Up: Patient has follow-up visit scheduled with Dr. Tamala Julian for reevaluation and potential repeat OMT.  Patient may keep this appointment and follow-up with me in between office visits with Dr. Tamala Julian as needed for pain flares   Subjective:   I, Pincus Badder, am serving as a Education administrator for Doctor Glennon Mac  Chief Complaint: MSK   HPI:   01/23/2022 [name] is a 46 y.o. female coming in with complaint of back and neck pain. OMT 12/12/2021. Patient states that she has good and bad days.  Patient has been doing relatively well.  Did see recently and her pain medicine doctor did do  some trigger point injections.  States that no significant difference at this time though.   Medications patient has been prescribed: Zanaflex, Synthroid, Viibryd  02/06/2022 Patient states that when she turns her head a certain way she has a pulling sensation in her trap   Relevant Historical Information: Fibromyalgia, GERD, hypertension  Additional pertinent review of systems negative.  Current Outpatient Medications  Medication Sig Dispense Refill   acetaminophen (TYLENOL) 500 MG tablet Take 1,000 mg by mouth every 6 (six) hours as needed for mild pain or headache.     Acetaminophen-Caffeine (EXCEDRIN TENSION HEADACHE) 500-65 MG TABS Take 2 tablets by mouth daily as needed (headache).     amLODipine (NORVASC) 5 MG tablet Take 1 tablet (5 mg total) by mouth daily. 90 tablet 1   Cholecalciferol (VITAMIN D3) 2000 units TABS Take 2,000 Units by mouth daily.     docusate sodium (COLACE) 100 MG capsule Take 100 mg by mouth daily.     fluticasone (FLONASE) 50 MCG/ACT nasal spray Place 2 sprays into both nostrils daily. 16 g 6   levothyroxine (SYNTHROID) 50 MCG tablet Take 1 tablet (50 mcg total) by mouth daily. 90 tablet 0   montelukast (SINGULAIR) 10 MG tablet Take 1 tablet (10 mg total) by mouth at bedtime. 90 tablet 1   multivitamin-lutein (OCUVITE-LUTEIN) CAPS capsule Take 1 capsule by mouth daily.     nitrofurantoin (MACRODANTIN) 50 MG capsule Take as directed after intercourse 45 capsule 0   omeprazole (PRILOSEC) 20 MG capsule Take 20 mg by mouth daily.     Prasterone, DHEA, (DHEA ADVANCED FORMULA PO) Take 1 tablet by  mouth daily.      pregabalin (LYRICA) 100 MG capsule Take 1 capsule (100 mg total) by mouth 2 (two) times daily. For nerve pain/fibromyalgia 60 capsule 5   Propylene Glycol (SYSTANE COMPLETE OP) Place 1 drop into both eyes daily.     tiZANidine (ZANAFLEX) 4 MG tablet Take 1 tablet (4 mg total) by mouth at bedtime. 30 tablet 0   traMADol (ULTRAM) 50 MG tablet Take 1 tablet  (50 mg total) by mouth every 12 (twelve) hours as needed for moderate pain 30 tablet 0   Vilazodone HCl (VIIBRYD) 20 MG TABS Take 1 tablet (20 mg total) by mouth daily. 90 tablet 0   No current facility-administered medications for this visit.      Objective:     Vitals:   02/06/22 0935  BP: 120/80  Weight: 148 lb (67.1 kg)  Height: '5\' 3"'$  (1.6 m)      Body mass index is 26.22 kg/m.    Physical Exam:     General: Well-appearing, cooperative, sitting comfortably in no acute distress.   OMT Physical Exam:  ASIS Compression Test: Positive Right Cervical: TTP paraspinal, C3-5 RRSR Rib: Bilateral elevated first rib with TTP Thoracic: TTP paraspinal, T5-8 RRSL TTP right trapezius  Electronically signed by:  Benito Mccreedy D.[facility] 10:01 AM 02/06/22

## 2022-02-06 ENCOUNTER — Ambulatory Visit: Payer: 59 | Admitting: Sports Medicine

## 2022-02-06 VITALS — BP 120/80 | Ht 63.0 in | Wt 148.0 lb

## 2022-02-06 DIAGNOSIS — M542 Cervicalgia: Secondary | ICD-10-CM

## 2022-02-06 DIAGNOSIS — M7918 Myalgia, other site: Secondary | ICD-10-CM

## 2022-02-06 DIAGNOSIS — M9902 Segmental and somatic dysfunction of thoracic region: Secondary | ICD-10-CM | POA: Diagnosis not present

## 2022-02-06 DIAGNOSIS — M9908 Segmental and somatic dysfunction of rib cage: Secondary | ICD-10-CM | POA: Diagnosis not present

## 2022-02-06 DIAGNOSIS — M9901 Segmental and somatic dysfunction of cervical region: Secondary | ICD-10-CM

## 2022-02-06 DIAGNOSIS — S46811D Strain of other muscles, fascia and tendons at shoulder and upper arm level, right arm, subsequent encounter: Secondary | ICD-10-CM | POA: Diagnosis not present

## 2022-02-06 NOTE — Patient Instructions (Signed)
Good to see you   

## 2022-02-11 DIAGNOSIS — M797 Fibromyalgia: Secondary | ICD-10-CM | POA: Diagnosis not present

## 2022-02-11 DIAGNOSIS — M25512 Pain in left shoulder: Secondary | ICD-10-CM | POA: Diagnosis not present

## 2022-02-11 DIAGNOSIS — M545 Low back pain, unspecified: Secondary | ICD-10-CM | POA: Diagnosis not present

## 2022-02-18 DIAGNOSIS — M25512 Pain in left shoulder: Secondary | ICD-10-CM | POA: Diagnosis not present

## 2022-02-18 DIAGNOSIS — M545 Low back pain, unspecified: Secondary | ICD-10-CM | POA: Diagnosis not present

## 2022-02-18 DIAGNOSIS — M797 Fibromyalgia: Secondary | ICD-10-CM | POA: Diagnosis not present

## 2022-02-24 ENCOUNTER — Other Ambulatory Visit: Payer: Self-pay

## 2022-02-25 DIAGNOSIS — M797 Fibromyalgia: Secondary | ICD-10-CM | POA: Diagnosis not present

## 2022-02-25 DIAGNOSIS — M25512 Pain in left shoulder: Secondary | ICD-10-CM | POA: Diagnosis not present

## 2022-02-25 DIAGNOSIS — M545 Low back pain, unspecified: Secondary | ICD-10-CM | POA: Diagnosis not present

## 2022-03-04 ENCOUNTER — Other Ambulatory Visit (HOSPITAL_COMMUNITY): Payer: Self-pay

## 2022-03-04 DIAGNOSIS — M797 Fibromyalgia: Secondary | ICD-10-CM | POA: Diagnosis not present

## 2022-03-04 DIAGNOSIS — M25512 Pain in left shoulder: Secondary | ICD-10-CM | POA: Diagnosis not present

## 2022-03-04 DIAGNOSIS — M545 Low back pain, unspecified: Secondary | ICD-10-CM | POA: Diagnosis not present

## 2022-03-04 NOTE — Progress Notes (Signed)
[place] Phone: [phone] Subjective:   [name], am serving as a scribe for Dr. Hulan Saas.  I'm seeing this patient by the request  of:  Binnie Rail, MD  CC: Back and neck pain follow-up  [id number]  Nayvie Mayte Diers is a 46 y.o. female coming in with complaint of back and neck pain. OMT 01/23/2022. Patient states that her R SI joint is sore. Pain does not radiate. Doing physical therapy once a week. Using IBU, Tylenol and Tramadol.   Medications patient has been prescribed: Synthroid, Zanaflex, Viibryd  Taking:         Reviewed prior external information including notes and imaging from previsou exam, outside providers and external EMR if available.   As well as notes that were available from care everywhere and other healthcare systems.  Past medical history, social, surgical and family history all reviewed in electronic medical record.  No pertanent information unless stated regarding to the chief complaint.   Past Medical History:  Diagnosis Date   Allergy    Autoimmune disorder ([place])    Crohn's disease ([place])    Endometriosis    Hypertension    Kidney, medullary sponge    Ovarian cyst    Pre-eclampsia    Raynaud's disease    Stress incontinence in female     Allergies  Allergen Reactions   Lamisil [Terbinafine] Hives and Itching    In pill form     Review of Systems:  No headache, visual changes, nausea, vomiting, diarrhea, constipation, dizziness, abdominal pain, skin rash, fevers, chills, night sweats, weight loss, swollen lymph nodes,  joint swelling, chest pain, shortness of breath, mood changes. POSITIVE muscle aches, body aches  Objective  Blood pressure 110/74, height '5\' 3"'$  (1.6 m), weight 152 lb (68.9 kg).   General: No apparent distress alert and oriented x3 mood and affect normal, dressed appropriately.  HEENT: Pupils equal, extraocular movements  intact  Respiratory: Patient's speak in full sentences and does not appear short of breath  Cardiovascular: No lower extremity edema, non tender, no erythema  Patient does have tenderness to palpation in multiple different areas.  Tightness noted in the parascapular area as well.  Seems to be right greater than left.  Does have tightness in the lower back as well.  Osteopathic findings  C2 flexed rotated and side bent right C6 flexed rotated and side bent left T3 extended rotated and side bent right inhaled rib T9 extended rotated and side bent left L2 flexed rotated and side bent right Sacrum right on right Pelvic shear noted      Assessment and Plan:  Chronic pain syndrome Discussed with patient at great length.  Discussed which activities to do and which ones to avoid.  Does not have any signs of fibromyalgia.  Has responded somewhat to osteopathic manipulation.  Has also a lot of other stress that is likely contributing as well at the moment.  We discussed the Zanaflex as well.    Nonallopathic problems  Decision today to treat with OMT was based on Physical Exam  After verbal consent patient was treated with HVLA, ME, FPR techniques in cervical, rib, thoracic, lumbar, and sacral  areas  Patient tolerated the procedure well with improvement in symptoms  Patient given exercises, stretches and lifestyle modifications  See medications in patient instructions if given  Patient will follow up in 4-8 weeks     The above documentation has  been reviewed and is accurate and complete Lyndal Pulley, DO         Note: This dictation was prepared with Dragon dictation along with smaller phrase technology. Any transcriptional errors that result from this process are unintentional.

## 2022-03-06 ENCOUNTER — Ambulatory Visit: Payer: 59 | Admitting: Family Medicine

## 2022-03-06 VITALS — BP 110/74 | Ht 63.0 in | Wt 152.0 lb

## 2022-03-06 DIAGNOSIS — M9908 Segmental and somatic dysfunction of rib cage: Secondary | ICD-10-CM | POA: Diagnosis not present

## 2022-03-06 DIAGNOSIS — M9903 Segmental and somatic dysfunction of lumbar region: Secondary | ICD-10-CM

## 2022-03-06 DIAGNOSIS — M9904 Segmental and somatic dysfunction of sacral region: Secondary | ICD-10-CM | POA: Diagnosis not present

## 2022-03-06 DIAGNOSIS — G894 Chronic pain syndrome: Secondary | ICD-10-CM

## 2022-03-06 DIAGNOSIS — M533 Sacrococcygeal disorders, not elsewhere classified: Secondary | ICD-10-CM | POA: Diagnosis not present

## 2022-03-06 DIAGNOSIS — M9902 Segmental and somatic dysfunction of thoracic region: Secondary | ICD-10-CM

## 2022-03-06 DIAGNOSIS — M9901 Segmental and somatic dysfunction of cervical region: Secondary | ICD-10-CM

## 2022-03-06 NOTE — Assessment & Plan Note (Signed)
Chronic problem with exacerbation as well.  Discussed icing regimen and home exercises, which activities to do and which ones to avoid.  Increase activity slowly.  Follow-up again in 6 to 8 weeks.

## 2022-03-06 NOTE — Assessment & Plan Note (Signed)
Discussed with patient at great length.  Discussed which activities to do and which ones to avoid.  Does not have any signs of fibromyalgia.  Has responded somewhat to osteopathic manipulation.  Has also a lot of other stress that is likely contributing as well at the moment.  We discussed the Zanaflex as well.

## 2022-03-06 NOTE — Patient Instructions (Signed)
Good to see you Enjoy the mtns See me in 6 weeks

## 2022-03-18 DIAGNOSIS — M797 Fibromyalgia: Secondary | ICD-10-CM | POA: Diagnosis not present

## 2022-03-18 DIAGNOSIS — M545 Low back pain, unspecified: Secondary | ICD-10-CM | POA: Diagnosis not present

## 2022-03-18 DIAGNOSIS — M25512 Pain in left shoulder: Secondary | ICD-10-CM | POA: Diagnosis not present

## 2022-03-23 ENCOUNTER — Other Ambulatory Visit: Payer: Self-pay

## 2022-03-23 ENCOUNTER — Other Ambulatory Visit: Payer: Self-pay | Admitting: Family Medicine

## 2022-03-23 ENCOUNTER — Other Ambulatory Visit (HOSPITAL_COMMUNITY): Payer: Self-pay

## 2022-03-23 ENCOUNTER — Other Ambulatory Visit: Payer: Self-pay | Admitting: Internal Medicine

## 2022-03-23 MED ORDER — LEVOTHYROXINE SODIUM 50 MCG PO TABS
50.0000 ug | ORAL_TABLET | Freq: Every day | ORAL | 0 refills | Status: DC
  Filled 2022-03-23: qty 90, 90d supply, fill #0

## 2022-03-23 MED ORDER — AMLODIPINE BESYLATE 5 MG PO TABS
5.0000 mg | ORAL_TABLET | Freq: Every day | ORAL | 1 refills | Status: DC
  Filled 2022-03-23 (×2): qty 90, 90d supply, fill #0
  Filled 2022-06-23: qty 90, 90d supply, fill #1

## 2022-03-24 ENCOUNTER — Other Ambulatory Visit (HOSPITAL_COMMUNITY): Payer: Self-pay

## 2022-03-25 DIAGNOSIS — M545 Low back pain, unspecified: Secondary | ICD-10-CM | POA: Diagnosis not present

## 2022-03-25 DIAGNOSIS — M797 Fibromyalgia: Secondary | ICD-10-CM | POA: Diagnosis not present

## 2022-03-25 DIAGNOSIS — M25512 Pain in left shoulder: Secondary | ICD-10-CM | POA: Diagnosis not present

## 2022-04-06 ENCOUNTER — Other Ambulatory Visit (HOSPITAL_COMMUNITY): Payer: Self-pay

## 2022-04-06 ENCOUNTER — Other Ambulatory Visit: Payer: Self-pay | Admitting: Family Medicine

## 2022-04-07 ENCOUNTER — Other Ambulatory Visit (HOSPITAL_COMMUNITY): Payer: Self-pay

## 2022-04-07 MED ORDER — VILAZODONE HCL 20 MG PO TABS
1.0000 | ORAL_TABLET | Freq: Every day | ORAL | 0 refills | Status: DC
  Filled 2022-04-07: qty 90, 90d supply, fill #0

## 2022-04-08 DIAGNOSIS — M25512 Pain in left shoulder: Secondary | ICD-10-CM | POA: Diagnosis not present

## 2022-04-08 DIAGNOSIS — M545 Low back pain, unspecified: Secondary | ICD-10-CM | POA: Diagnosis not present

## 2022-04-08 DIAGNOSIS — M797 Fibromyalgia: Secondary | ICD-10-CM | POA: Diagnosis not present

## 2022-04-15 NOTE — Progress Notes (Signed)
[place] Phone: [phone] Subjective:   Lorraine [name], am serving as a scribe for Dr. Hulan Saas.  I'm seeing this patient by the request  of:  Binnie Rail, MD  CC: Back pain follow-up  [id number]  Lorraine [name] is a 47 y.o. female coming in with complaint of back and neck pain. OMT On 03/06/2022. Patient states that she has been doing well since last visit. Slightly more sore this week.   Medications patient has been prescribed: Viibyrd Synthroid  Taking:         Reviewed prior external information including notes and imaging from previsou exam, outside providers and external EMR if available.   As well as notes that were available from care everywhere and other healthcare systems.  Past medical history, social, surgical and family history all reviewed in electronic medical record.  [name] unless stated regarding to the chief complaint.   Past Medical History:  Diagnosis Date   Allergy    Autoimmune disorder ([place])    Crohn's disease ([place])    Endometriosis    Hypertension    Kidney, medullary sponge    Ovarian cyst    Pre-eclampsia    Raynaud's disease    Stress incontinence in female     Allergies  Allergen Reactions   Lamisil [Terbinafine] Hives and Itching    In pill form     Review of Systems:  [name] headache, visual changes, nausea, vomiting, diarrhea, constipation, dizziness, abdominal pain, skin rash, fevers, chills, night sweats, weight loss, swollen lymph nodes, body aches, joint swelling, chest pain, shortness of breath, mood changes. POSITIVE muscle aches  Objective  Blood pressure 122/84, pulse (!) 52, height '5\' 3"'$  (1.6 m), weight 151 lb (68.5 kg), SpO2 94 %.   General: [name] apparent distress alert and oriented x3 mood and affect normal, dressed appropriately.  HEENT: Pupils equal, extraocular movements intact  Respiratory: Patient's speak  in full sentences and does not appear short of breath  Cardiovascular: [name] lower extremity edema, non tender, [name] erythema  Low back does have some loss of lordosis.  Some tenderness to palpation in the paraspinal musculature.  Patient does have tightness in the thoracolumbar juncture.  Osteopathic findings  C2 flexed rotated and side bent right C6 flexed rotated and side bent left T8 extended rotated and side bent right inhaled rib L3 flexed rotated and side bent left Sacrum right on right       Assessment and Plan:  SI (sacroiliac) joint dysfunction Chronic, with continued discomfort noted.  Multiple other comorbidities that are likely contributing as well.  Patient will likely has been able to stay active and is doing relatively well with her core strength at the moment.  Continue the Viibryd.  Patient is seen also pain management and responding well to trigger point injections.  Follow-up with me again in 6 to 8 weeks.    Nonallopathic problems  Decision today to treat with OMT was based on Physical Exam  After verbal consent patient was treated with HVLA, ME, FPR techniques in cervical, rib, thoracic, lumbar, and sacral  areas  Patient tolerated the procedure well with improvement in symptoms  Patient given exercises, stretches and lifestyle modifications  See medications in patient instructions if given  Patient will follow up in 4-8 weeks     The above documentation has been reviewed and is accurate and complete Lyndal Pulley, DO  Note: This dictation was prepared with Dragon dictation along with smaller phrase technology. Any transcriptional errors that result from this process are unintentional.

## 2022-04-17 ENCOUNTER — Ambulatory Visit: Payer: Commercial Managed Care - PPO | Admitting: Family Medicine

## 2022-04-17 ENCOUNTER — Encounter: Payer: Self-pay | Admitting: Physical Medicine and Rehabilitation

## 2022-04-17 ENCOUNTER — Other Ambulatory Visit (HOSPITAL_COMMUNITY): Payer: Self-pay

## 2022-04-17 ENCOUNTER — Encounter
Payer: Commercial Managed Care - PPO | Attending: Physical Medicine and Rehabilitation | Admitting: Physical Medicine and Rehabilitation

## 2022-04-17 VITALS — BP 122/84 | HR 52 | Ht 63.0 in | Wt 151.0 lb

## 2022-04-17 VITALS — BP 132/87 | HR 76 | Ht 63.0 in | Wt 148.0 lb

## 2022-04-17 DIAGNOSIS — M533 Sacrococcygeal disorders, not elsewhere classified: Secondary | ICD-10-CM

## 2022-04-17 DIAGNOSIS — M9908 Segmental and somatic dysfunction of rib cage: Secondary | ICD-10-CM | POA: Diagnosis not present

## 2022-04-17 DIAGNOSIS — M9903 Segmental and somatic dysfunction of lumbar region: Secondary | ICD-10-CM

## 2022-04-17 DIAGNOSIS — M9901 Segmental and somatic dysfunction of cervical region: Secondary | ICD-10-CM

## 2022-04-17 DIAGNOSIS — M9902 Segmental and somatic dysfunction of thoracic region: Secondary | ICD-10-CM

## 2022-04-17 DIAGNOSIS — M9904 Segmental and somatic dysfunction of sacral region: Secondary | ICD-10-CM

## 2022-04-17 DIAGNOSIS — G894 Chronic pain syndrome: Secondary | ICD-10-CM | POA: Diagnosis not present

## 2022-04-17 DIAGNOSIS — M7918 Myalgia, other site: Secondary | ICD-10-CM | POA: Insufficient documentation

## 2022-04-17 MED ORDER — PREGABALIN 100 MG PO CAPS
100.0000 mg | ORAL_CAPSULE | Freq: Two times a day (BID) | ORAL | 5 refills | Status: DC
  Filled 2022-04-17 – 2022-04-24 (×2): qty 60, 30d supply, fill #0
  Filled 2022-06-02: qty 60, 30d supply, fill #1
  Filled 2022-07-05: qty 60, 30d supply, fill #2
  Filled 2022-08-10: qty 60, 30d supply, fill #3
  Filled 2022-09-24: qty 60, 30d supply, fill #4
  Filled 2022-11-02: qty 60, 30d supply, fill #5

## 2022-04-17 MED ORDER — LIDOCAINE HCL 1 % IJ SOLN
5.0000 mL | Freq: Once | INTRAMUSCULAR | Status: AC
  Administered 2022-04-17: 5 mL

## 2022-04-17 NOTE — Progress Notes (Signed)
Pt is a 47 yr old female with dx of Fibromyalgia, HTN, and hypothyroidism- last TSH 1.07;  Has medullar sponge kidney.  Here for evaluation of fibromyalgia- got dx 10/21- but Sx's since 2017. Also has myofascial pain syndrome- Here for f/u on Fibromyalgia and to get Trigger point injections.   Dr Tamala Julian- DO/OMT doesn't think pt has Fibromyalgia per his note.  Doing pretty well right now.  Flare at Xmas- due to stress.   Feeling better since then.  Taking advantage of feeling better to do more .  Low back been more sore than usual.  Dr Tamala Julian found a new trigger point in L rhomboids.   Doing more ulrasound lately.  Looking at patient and holding L arm up and getting rhomboids TrP lately, as well.   Can also do forearms and hands TrP injections    On tramadol by PCP On Lyrica from me- no issues.   Plan: Patient here for trigger point injections for  Consent done and on chart.  Cleaned areas with alcohol and injected using a 27 gauge 1.5 inch needle  Injected 5cc- wasted 1cc Using 1% Lidocaine with no EPI  Upper traps B/L x2 Levators- B/L  Posterior scalenes Middle scalenes- B/L  Splenius Capitus Pectoralis Major- B/L  Rhomboids- L only x2 Infraspinatus Teres Major/minor Thoracic paraspinals Lumbar paraspinals Other injections- B/L forearms x2 and first dorsal interossei B/L   Patient's level of pain prior was Current level of pain after injections is  There was no bleeding or complications.  Patient was advised to drink a lot of water on day after injections to flush system Will have increased soreness for 12-48 hours after injections.  Can use Lidocaine patches the day AFTER injections Can use theracane on day of injections in places didn't inject Can use heating pad 4-6 hours AFTER injections   2. Con't Lyrica 100 mg 2x/day- sen in refills for 6 months.   3. Tramadol per PCP.   4. F/U 2-3 months- trP injections and f/u on pain

## 2022-04-17 NOTE — Patient Instructions (Signed)
Good to see you  Stay active See me in 7-8 weeks

## 2022-04-17 NOTE — Patient Instructions (Signed)
Plan: Patient here for trigger point injections for  Consent done and on chart.  Cleaned areas with alcohol and injected using a 27 gauge 1.5 inch needle  Injected 5cc- wasted 1cc Using 1% Lidocaine with no EPI  Upper traps B/L x2 Levators- B/L  Posterior scalenes Middle scalenes- B/L  Splenius Capitus Pectoralis Major- B/L  Rhomboids- L only x2 Infraspinatus Teres Major/minor Thoracic paraspinals Lumbar paraspinals Other injections- B/L forearms x2 and first dorsal interossei B/L   Patient's level of pain prior was Current level of pain after injections is  There was no bleeding or complications.  Patient was advised to drink a lot of water on day after injections to flush system Will have increased soreness for 12-48 hours after injections.  Can use Lidocaine patches the day AFTER injections Can use theracane on day of injections in places didn't inject Can use heating pad 4-6 hours AFTER injections   2. Con't Lyrica 100 mg 2x/day- sen in refills for 6 months.   3. Tramadol per PCP.   4. F/U 2-3 months- trP injections and f/u on pain

## 2022-04-17 NOTE — Assessment & Plan Note (Signed)
Chronic, with continued discomfort noted.  Multiple other comorbidities that are likely contributing as well.  Patient will likely has been able to stay active and is doing relatively well with her core strength at the moment.  Continue the Viibryd.  Patient is seen also pain management and responding well to trigger point injections.  Follow-up with me again in 6 to 8 weeks.

## 2022-04-21 DIAGNOSIS — F341 Dysthymic disorder: Secondary | ICD-10-CM | POA: Diagnosis not present

## 2022-04-24 ENCOUNTER — Other Ambulatory Visit (HOSPITAL_COMMUNITY): Payer: Self-pay

## 2022-04-27 ENCOUNTER — Telehealth: Payer: Self-pay | Admitting: Family Medicine

## 2022-04-27 NOTE — Telephone Encounter (Signed)
Patient called asking if she would be able to come in for a steroid injection this week? She said that her back is pretty flared up and thinks this would help?  Please advise.  (Offered to make an appointment with Dr Glennon Mac, she wanted to wait for Dr Tamala Julian)

## 2022-04-28 ENCOUNTER — Other Ambulatory Visit: Payer: Self-pay | Admitting: Family Medicine

## 2022-04-28 ENCOUNTER — Other Ambulatory Visit (HOSPITAL_COMMUNITY): Payer: Self-pay

## 2022-04-28 DIAGNOSIS — F341 Dysthymic disorder: Secondary | ICD-10-CM | POA: Diagnosis not present

## 2022-04-28 MED ORDER — TIZANIDINE HCL 4 MG PO TABS
4.0000 mg | ORAL_TABLET | Freq: Every day | ORAL | 0 refills | Status: DC
  Filled 2022-04-28: qty 30, 30d supply, fill #0

## 2022-04-28 NOTE — Telephone Encounter (Signed)
Sent patient MyChart message about appt.

## 2022-04-29 ENCOUNTER — Ambulatory Visit (INDEPENDENT_AMBULATORY_CARE_PROVIDER_SITE_OTHER): Payer: Commercial Managed Care - PPO

## 2022-04-29 DIAGNOSIS — M9903 Segmental and somatic dysfunction of lumbar region: Secondary | ICD-10-CM | POA: Diagnosis not present

## 2022-04-29 DIAGNOSIS — M25512 Pain in left shoulder: Secondary | ICD-10-CM | POA: Diagnosis not present

## 2022-04-29 DIAGNOSIS — M9902 Segmental and somatic dysfunction of thoracic region: Secondary | ICD-10-CM

## 2022-04-29 DIAGNOSIS — M9904 Segmental and somatic dysfunction of sacral region: Secondary | ICD-10-CM

## 2022-04-29 DIAGNOSIS — M545 Low back pain, unspecified: Secondary | ICD-10-CM | POA: Diagnosis not present

## 2022-04-29 DIAGNOSIS — M797 Fibromyalgia: Secondary | ICD-10-CM | POA: Diagnosis not present

## 2022-04-29 MED ORDER — KETOROLAC TROMETHAMINE 60 MG/2ML IM SOLN
60.0000 mg | Freq: Once | INTRAMUSCULAR | Status: AC
  Administered 2022-04-29: 60 mg via INTRAMUSCULAR

## 2022-04-29 MED ORDER — METHYLPREDNISOLONE ACETATE 80 MG/ML IJ SUSP
80.0000 mg | Freq: Once | INTRAMUSCULAR | Status: AC
  Administered 2022-04-29: 80 mg via INTRAMUSCULAR

## 2022-04-29 NOTE — Progress Notes (Signed)
Patient given depo 39m and Toradol 616mPer Dr. SmTamala JulianPatient wanted to get both injection on the left upper outer buttock. Patient given injections and tolerated them well.

## 2022-05-06 DIAGNOSIS — M545 Low back pain, unspecified: Secondary | ICD-10-CM | POA: Diagnosis not present

## 2022-05-06 DIAGNOSIS — M25512 Pain in left shoulder: Secondary | ICD-10-CM | POA: Diagnosis not present

## 2022-05-06 DIAGNOSIS — M797 Fibromyalgia: Secondary | ICD-10-CM | POA: Diagnosis not present

## 2022-05-08 ENCOUNTER — Other Ambulatory Visit: Payer: Self-pay | Admitting: Internal Medicine

## 2022-05-08 ENCOUNTER — Other Ambulatory Visit (HOSPITAL_COMMUNITY): Payer: Self-pay

## 2022-05-08 MED ORDER — TRAMADOL HCL 50 MG PO TABS
50.0000 mg | ORAL_TABLET | Freq: Two times a day (BID) | ORAL | 0 refills | Status: DC | PRN
  Filled 2022-05-08: qty 30, 15d supply, fill #0

## 2022-05-12 DIAGNOSIS — F341 Dysthymic disorder: Secondary | ICD-10-CM | POA: Diagnosis not present

## 2022-05-18 ENCOUNTER — Other Ambulatory Visit (HOSPITAL_COMMUNITY): Payer: Self-pay

## 2022-05-21 DIAGNOSIS — F341 Dysthymic disorder: Secondary | ICD-10-CM | POA: Diagnosis not present

## 2022-05-21 DIAGNOSIS — F419 Anxiety disorder, unspecified: Secondary | ICD-10-CM | POA: Diagnosis not present

## 2022-05-27 ENCOUNTER — Telehealth: Payer: Commercial Managed Care - PPO | Admitting: Physician Assistant

## 2022-05-27 ENCOUNTER — Other Ambulatory Visit (HOSPITAL_COMMUNITY): Payer: Self-pay

## 2022-05-27 DIAGNOSIS — B9689 Other specified bacterial agents as the cause of diseases classified elsewhere: Secondary | ICD-10-CM | POA: Diagnosis not present

## 2022-05-27 DIAGNOSIS — J019 Acute sinusitis, unspecified: Secondary | ICD-10-CM | POA: Diagnosis not present

## 2022-05-27 DIAGNOSIS — M797 Fibromyalgia: Secondary | ICD-10-CM | POA: Diagnosis not present

## 2022-05-27 DIAGNOSIS — M25512 Pain in left shoulder: Secondary | ICD-10-CM | POA: Diagnosis not present

## 2022-05-27 DIAGNOSIS — M545 Low back pain, unspecified: Secondary | ICD-10-CM | POA: Diagnosis not present

## 2022-05-27 MED ORDER — AMOXICILLIN-POT CLAVULANATE 875-125 MG PO TABS
1.0000 | ORAL_TABLET | Freq: Two times a day (BID) | ORAL | 0 refills | Status: DC
  Filled 2022-05-27: qty 14, 7d supply, fill #0

## 2022-05-27 NOTE — Progress Notes (Signed)

## 2022-05-28 DIAGNOSIS — F419 Anxiety disorder, unspecified: Secondary | ICD-10-CM | POA: Diagnosis not present

## 2022-05-28 DIAGNOSIS — F341 Dysthymic disorder: Secondary | ICD-10-CM | POA: Diagnosis not present

## 2022-06-03 ENCOUNTER — Other Ambulatory Visit: Payer: Self-pay

## 2022-06-03 ENCOUNTER — Other Ambulatory Visit (HOSPITAL_COMMUNITY): Payer: Self-pay

## 2022-06-04 NOTE — Progress Notes (Signed)
[name] [address] [place] [address] Phone: [phone] Subjective:   [name], am serving as a scribe for Dr. Antoine Primas.  I'm seeing this patient by the request  of:  Pincus Sanes, MD  CC: Back and neck pain follow-up  BJY:[id number]  [name] is a 47 y.o. female coming in with complaint of back and neck pain. OMT on 04/17/2022. Patient states doing well. Has flare ups in SI joints sometimes. No new concerns.  Feels like the sacroiliac versus the tendinopathy of the right gluteal region seems to continue to have difficulty.  Sometimes notices the left piriformis as well.  No significant flares of the fibromyalgia but we will continue to monitor.  Continues to see other providers as well.  Medications patient has been prescribed: Zanaflex  Taking:         Reviewed prior external information including notes and imaging from previsou exam, outside providers and external EMR if available.   As well as notes that were available from care everywhere and other healthcare systems.  Past medical history, social, surgical and family history all reviewed in electronic medical record.  No pertanent information unless stated regarding to the chief complaint.   Past Medical History:  Diagnosis Date   Allergy    Autoimmune disorder (HCC)    Crohn's disease (HCC)    Endometriosis    Hypertension    Kidney, medullary sponge    Ovarian cyst    Pre-eclampsia    Raynaud's disease    Stress incontinence in female     Allergies  Allergen Reactions   Lamisil [Terbinafine] Hives and Itching    In pill form     Review of Systems:  No headache, visual changes, nausea, vomiting, diarrhea, constipation, dizziness, abdominal pain, skin rash, fevers, chills, night sweats, weight loss, swollen lymph nodes, body aches, joint swelling, chest pain, shortness of breath, mood changes. POSITIVE muscle aches  Objective  Blood  pressure (!) 128/90, pulse 83, height 5\' 3"  (1.6 m), weight 149 lb (67.6 kg), SpO2 99 %.   General: No apparent distress alert and oriented x3 mood and affect normal, dressed appropriately.  HEENT: Pupils equal, extraocular movements intact  Respiratory: Patient's speak in full sentences and does not appear short of breath  Cardiovascular: No lower extremity edema, non tender, no erythema  Low back pain gait is still.  More tightness in the right lumbar junction.  Significant tightness noted in the occipital region.  Tightness with Faber bilaterally) on left.  Osteopathic findings  C2 flexed rotated and side bent right C3 flexed rotated and side bent left T3 extended rotated and side bent right inhaled rib T8 extended rotated and side bent left L2 flexed rotated and side bent right Sacrum right on right       Assessment and Plan:  SI (sacroiliac) joint dysfunction Sacroiliac joints and has had more difficulty.  This seems to be giving her more difficulty on the right.  Patient is concerned for potential atrophy.  Did discuss with her at great length about the potential for the MRI again.  The last one we have on record is from 2017.  Could be candidate for potential injections if we see any more protrusion causing the S1 level.  We discussed with patient to continue to do the core strengthening with hip abductor strengthening.  Follow-up with me again in 6 to 8 weeks  Prediabetes Patient has had some difficulty with prediabetes  discussed with patient about this.  Patient would like referral to nutrition and diabetes for further evaluation and treatment.  Patient is trying to work on weight loss.  Finding it difficult.  Would like to get BMI under 25    Nonallopathic problems  Decision today to treat with OMT was based on Physical Exam  After verbal consent patient was treated with HVLA, ME, FPR techniques in cervical, rib, thoracic, lumbar, and sacral  areas  Patient tolerated the  procedure well with improvement in symptoms  Patient given exercises, stretches and lifestyle modifications  See medications in patient instructions if given  Patient will follow up in 4-8 weeks    The above documentation has been reviewed and is accurate and complete Judi SaaZachary M Ilisha Blust, DO          Note: This dictation was prepared with Dragon dictation along with smaller phrase technology. Any transcriptional errors that result from this process are unintentional.

## 2022-06-08 DIAGNOSIS — M545 Low back pain, unspecified: Secondary | ICD-10-CM | POA: Diagnosis not present

## 2022-06-08 DIAGNOSIS — M25512 Pain in left shoulder: Secondary | ICD-10-CM | POA: Diagnosis not present

## 2022-06-08 DIAGNOSIS — M797 Fibromyalgia: Secondary | ICD-10-CM | POA: Diagnosis not present

## 2022-06-10 ENCOUNTER — Other Ambulatory Visit: Payer: Self-pay

## 2022-06-12 ENCOUNTER — Ambulatory Visit: Payer: Commercial Managed Care - PPO | Admitting: Family Medicine

## 2022-06-12 ENCOUNTER — Encounter: Payer: Self-pay | Admitting: Family Medicine

## 2022-06-12 VITALS — BP 128/90 | HR 83 | Ht 63.0 in | Wt 149.0 lb

## 2022-06-12 DIAGNOSIS — M533 Sacrococcygeal disorders, not elsewhere classified: Secondary | ICD-10-CM

## 2022-06-12 DIAGNOSIS — M9901 Segmental and somatic dysfunction of cervical region: Secondary | ICD-10-CM | POA: Diagnosis not present

## 2022-06-12 DIAGNOSIS — M9902 Segmental and somatic dysfunction of thoracic region: Secondary | ICD-10-CM

## 2022-06-12 DIAGNOSIS — R7303 Prediabetes: Secondary | ICD-10-CM | POA: Diagnosis not present

## 2022-06-12 DIAGNOSIS — M9903 Segmental and somatic dysfunction of lumbar region: Secondary | ICD-10-CM | POA: Diagnosis not present

## 2022-06-12 DIAGNOSIS — M9904 Segmental and somatic dysfunction of sacral region: Secondary | ICD-10-CM

## 2022-06-12 DIAGNOSIS — M9908 Segmental and somatic dysfunction of rib cage: Secondary | ICD-10-CM

## 2022-06-12 NOTE — Patient Instructions (Signed)
Good to see you! Keep working with PT Referral to nutrition See you again in 6-8 weeks

## 2022-06-12 NOTE — Assessment & Plan Note (Signed)
Patient has had some difficulty with prediabetes discussed with patient about this.  Patient would like referral to nutrition and diabetes for further evaluation and treatment.  Patient is trying to work on weight loss.  Finding it difficult.  Would like to get BMI under 25

## 2022-06-12 NOTE — Assessment & Plan Note (Signed)
Sacroiliac joints and has had more difficulty.  This seems to be giving her more difficulty on the right.  Patient is concerned for potential atrophy.  Did discuss with her at great length about the potential for the MRI again.  The last one we have on record is from 2017.  Could be candidate for potential injections if we see any more protrusion causing the S1 level.  We discussed with patient to continue to do the core strengthening with hip abductor strengthening.  Follow-up with me again in 6 to 8 weeks

## 2022-06-17 DIAGNOSIS — M797 Fibromyalgia: Secondary | ICD-10-CM | POA: Diagnosis not present

## 2022-06-17 DIAGNOSIS — M25512 Pain in left shoulder: Secondary | ICD-10-CM | POA: Diagnosis not present

## 2022-06-17 DIAGNOSIS — M545 Low back pain, unspecified: Secondary | ICD-10-CM | POA: Diagnosis not present

## 2022-06-18 ENCOUNTER — Encounter: Payer: Self-pay | Admitting: Internal Medicine

## 2022-06-18 NOTE — Progress Notes (Signed)
Subjective:    Patient ID: [name], female    DOB: 06-Feb-1976, 47 y.o.   MRN: [id number]     HPI Denny Peonrin is here for follow up of her chronic medical problems.  She feels flared up and she is not sleeping well.  She has a lot of stress - worrying about her son who has severe depression.  She has been flared up for about 2 weeks - has a lot of pain.  Her arms and legs feels like they weigh 1000 lbs and she aches all over.    Tries to do a yoga app on her phone.  [facility] once a week.    Medications and allergies reviewed with patient and updated if appropriate.  Current Outpatient Medications on File Prior to Visit  Medication Sig Dispense Refill   acetaminophen (TYLENOL) 500 MG tablet Take 1,000 mg by mouth every 6 (six) hours as needed for mild pain or headache.     Acetaminophen-Caffeine (EXCEDRIN TENSION HEADACHE) 500-65 MG TABS Take 2 tablets by mouth daily as needed (headache).     amLODipine (NORVASC) 5 MG tablet Take 1 tablet (5 mg total) by mouth daily. 90 tablet 1   Cholecalciferol (VITAMIN D3) 2000 units TABS Take 2,000 Units by mouth daily.     docusate sodium (COLACE) 100 MG capsule Take 100 mg by mouth daily.     fluticasone (FLONASE) 50 MCG/ACT nasal spray Place 2 sprays into both nostrils daily. 16 g 6   levothyroxine (SYNTHROID) 50 MCG tablet Take 1 tablet (50 mcg total) by mouth daily. 90 tablet 0   montelukast (SINGULAIR) 10 MG tablet Take 1 tablet (10 mg total) by mouth at bedtime. 90 tablet 1   multivitamin-lutein (OCUVITE-LUTEIN) CAPS capsule Take 1 capsule by mouth daily.     nitrofurantoin (MACRODANTIN) 50 MG capsule Take as directed after intercourse 45 capsule 0   omeprazole (PRILOSEC) 20 MG capsule Take 20 mg by mouth daily.     Prasterone, DHEA, (DHEA ADVANCED FORMULA PO) Take 1 tablet by mouth daily.      pregabalin (LYRICA) 100 MG capsule Take 1 capsule (100 mg total) by mouth 2 (two) times daily. For nerve pain/fibromyalgia 60 capsule 5    Propylene Glycol (SYSTANE COMPLETE OP) Place 1 drop into both eyes daily.     tiZANidine (ZANAFLEX) 4 MG tablet Take 1 tablet (4 mg total) by mouth at bedtime. 30 tablet 0   traMADol (ULTRAM) 50 MG tablet Take 1 tablet (50 mg total) by mouth every 12 (twelve) hours as needed for moderate pain 30 tablet 0   Vilazodone HCl (VIIBRYD) 20 MG TABS Take 1 tablet (20 mg total) by mouth daily. 90 tablet 0   No current facility-administered medications on file prior to visit.     Review of Systems  Constitutional:  Negative for fever.  Respiratory:  Negative for cough, shortness of breath and wheezing.   Cardiovascular:  Negative for chest pain, palpitations and leg swelling.  Neurological:  Negative for light-headedness and headaches.       Objective:   Vitals:   06/19/22 1304  BP: 120/78  Pulse: 71  Temp: 98 F (36.7 C)  SpO2: 97%   BP Readings from Last 3 Encounters:  06/19/22 120/78  06/12/22 (!) 128/90  04/17/22 132/87   Wt Readings from Last 3 Encounters:  06/19/22 150 lb (68 kg)  06/12/22 149 lb (67.6 kg)  04/17/22 148 lb (67.1 kg)   Body mass  index is 26.57 kg/m.    Physical Exam Constitutional:      General: She is not in acute distress.    Appearance: Normal appearance.  HENT:     Head: Normocephalic and atraumatic.  Eyes:     Conjunctiva/sclera: Conjunctivae normal.  Cardiovascular:     Rate and Rhythm: Normal rate and regular rhythm.     Heart sounds: Normal heart sounds.  Pulmonary:     Effort: Pulmonary effort is normal. No respiratory distress.     Breath sounds: Normal breath sounds. No wheezing.  Musculoskeletal:     Cervical back: Neck supple.     Right lower leg: No edema.     Left lower leg: No edema.  Lymphadenopathy:     Cervical: No cervical adenopathy.  Skin:    General: Skin is warm and dry.     Findings: No rash.  Neurological:     Mental Status: She is alert. Mental status is at baseline.  Psychiatric:        Mood and Affect: Mood  normal.        Behavior: Behavior normal.        Lab Results  Component Value Date   WBC 10.0 11/21/2021   HGB 13.6 11/21/2021   HCT 40.1 11/21/2021   PLT 263.0 11/21/2021   GLUCOSE 94 11/21/2021   CHOL 191 10/04/2020   TRIG 95.0 10/04/2020   HDL 54.20 10/04/2020   LDLCALC 118 (H) 10/04/2020   ALT 12 11/21/2021   AST 14 11/21/2021   NA 139 11/21/2021   K 3.5 11/21/2021   CL 103 11/21/2021   CREATININE 0.81 11/21/2021   BUN 21 11/21/2021   CO2 29 11/21/2021   TSH 1.74 11/21/2021   HGBA1C 5.7 11/21/2021     Assessment & Plan:    See Problem List for Assessment and Plan of chronic medical problems.

## 2022-06-18 NOTE — Patient Instructions (Addendum)
      Blood work was ordered.   The lab is on the first floor.     Medications changes include :   none      Return in about 6 months (around 12/19/2022) for Physical Exam.

## 2022-06-19 ENCOUNTER — Encounter: Payer: Self-pay | Admitting: Internal Medicine

## 2022-06-19 ENCOUNTER — Ambulatory Visit: Payer: Commercial Managed Care - PPO | Admitting: Internal Medicine

## 2022-06-19 VITALS — BP 120/78 | HR 71 | Temp 98.0°F | Ht 63.0 in | Wt 150.0 lb

## 2022-06-19 DIAGNOSIS — K219 Gastro-esophageal reflux disease without esophagitis: Secondary | ICD-10-CM

## 2022-06-19 DIAGNOSIS — E039 Hypothyroidism, unspecified: Secondary | ICD-10-CM

## 2022-06-19 DIAGNOSIS — R7303 Prediabetes: Secondary | ICD-10-CM

## 2022-06-19 DIAGNOSIS — M7918 Myalgia, other site: Secondary | ICD-10-CM | POA: Diagnosis not present

## 2022-06-19 DIAGNOSIS — N390 Urinary tract infection, site not specified: Secondary | ICD-10-CM | POA: Diagnosis not present

## 2022-06-19 DIAGNOSIS — I10 Essential (primary) hypertension: Secondary | ICD-10-CM | POA: Diagnosis not present

## 2022-06-19 LAB — CBC WITH DIFFERENTIAL/PLATELET
Basophils Absolute: 0.1 10*3/uL (ref 0.0–0.1)
Basophils Relative: 0.6 % (ref 0.0–3.0)
Eosinophils Absolute: 0.5 10*3/uL (ref 0.0–0.7)
Eosinophils Relative: 4.1 % (ref 0.0–5.0)
HCT: 41.3 % (ref 36.0–46.0)
Hemoglobin: 13.6 g/dL (ref 12.0–15.0)
Lymphocytes Relative: 30.8 % (ref 12.0–46.0)
Lymphs Abs: 3.5 10*3/uL (ref 0.7–4.0)
MCHC: 33 g/dL (ref 30.0–36.0)
MCV: 92.3 fl (ref 78.0–100.0)
Monocytes Absolute: 0.7 10*3/uL (ref 0.1–1.0)
Monocytes Relative: 6.4 % (ref 3.0–12.0)
Neutro Abs: 6.7 10*3/uL (ref 1.4–7.7)
Neutrophils Relative %: 58.1 % (ref 43.0–77.0)
Platelets: 322 10*3/uL (ref 150.0–400.0)
RBC: 4.47 Mil/uL (ref 3.87–5.11)
RDW: 12.7 % (ref 11.5–15.5)
WBC: 11.5 10*3/uL — ABNORMAL HIGH (ref 4.0–10.5)

## 2022-06-19 LAB — COMPREHENSIVE METABOLIC PANEL
ALT: 12 U/L (ref 0–35)
AST: 13 U/L (ref 0–37)
Albumin: 4.3 g/dL (ref 3.5–5.2)
Alkaline Phosphatase: 96 U/L (ref 39–117)
BUN: 13 mg/dL (ref 6–23)
CO2: 29 mEq/L (ref 19–32)
Calcium: 9.2 mg/dL (ref 8.4–10.5)
Chloride: 101 mEq/L (ref 96–112)
Creatinine, Ser: 0.81 mg/dL (ref 0.40–1.20)
GFR: 85.47 mL/min (ref >60.00)
Glucose, Bld: 82 mg/dL (ref 70–99)
Potassium: 3.8 mEq/L (ref 3.5–5.1)
Sodium: 139 mEq/L (ref 135–145)
Total Bilirubin: 0.3 mg/dL (ref 0.2–1.2)
Total Protein: 7.1 g/dL (ref 6.0–8.3)

## 2022-06-19 LAB — LIPID PANEL
Cholesterol: 184 mg/dL (ref 0–200)
HDL: 56.7 mg/dL (ref >39.00)
LDL Cholesterol: 100 mg/dL — ABNORMAL HIGH (ref 0–99)
NonHDL: 127.44
Total CHOL/HDL Ratio: 3
Triglycerides: 137 mg/dL (ref 0.0–149.0)
VLDL: 27.4 mg/dL (ref 0.0–40.0)

## 2022-06-19 LAB — TSH: TSH: 2.47 u[IU]/mL (ref 0.35–5.50)

## 2022-06-19 LAB — HEMOGLOBIN A1C: Hgb A1c MFr Bld: 5.7 % (ref 4.6–6.5)

## 2022-06-19 NOTE — Assessment & Plan Note (Addendum)
Chronic  Check tsh and will titrate med dose if needed Currently taking levothyroxine 50 mcg daily

## 2022-06-19 NOTE — Assessment & Plan Note (Signed)
Chronic Check a1c Low sugar / carb diet Stressed regular exercise  

## 2022-06-19 NOTE — Assessment & Plan Note (Signed)
Chronic Has medullary sponge kidney disease Frequent UTIs Continue nitrofurantoin 50 mg after intercourse for prevention 

## 2022-06-19 NOTE — Assessment & Plan Note (Signed)
Chronic GERD controlled Continue omeprazole 20 mg daily  

## 2022-06-19 NOTE — Assessment & Plan Note (Signed)
Chronic °Blood pressure well controlled °CMP °Continue amlodipine 5 mg daily °

## 2022-06-19 NOTE — Assessment & Plan Note (Addendum)
Chronic Myofascial pain and fibromyalgia Trigger point injections have not helped With current flare Following with Dr Katrinka Blazing and Dr lovorn Taking lyrica, tizanidine 4 mg at bed and tramadol as needed which is not often Continue tramadol 50 mg daily prn

## 2022-06-20 ENCOUNTER — Other Ambulatory Visit: Payer: Self-pay | Admitting: Internal Medicine

## 2022-06-20 MED ORDER — NITROFURANTOIN MACROCRYSTAL 50 MG PO CAPS
ORAL_CAPSULE | ORAL | 0 refills | Status: DC
  Filled 2022-06-20: qty 45, 30d supply, fill #0

## 2022-06-22 ENCOUNTER — Other Ambulatory Visit (HOSPITAL_COMMUNITY): Payer: Self-pay

## 2022-06-23 ENCOUNTER — Other Ambulatory Visit: Payer: Self-pay | Admitting: Family Medicine

## 2022-06-23 ENCOUNTER — Other Ambulatory Visit (HOSPITAL_COMMUNITY): Payer: Self-pay

## 2022-06-23 MED ORDER — LEVOTHYROXINE SODIUM 50 MCG PO TABS
50.0000 ug | ORAL_TABLET | Freq: Every day | ORAL | 0 refills | Status: DC
  Filled 2022-06-23: qty 90, 90d supply, fill #0

## 2022-06-24 ENCOUNTER — Other Ambulatory Visit (HOSPITAL_COMMUNITY): Payer: Self-pay

## 2022-06-24 ENCOUNTER — Other Ambulatory Visit: Payer: Self-pay

## 2022-06-24 DIAGNOSIS — M797 Fibromyalgia: Secondary | ICD-10-CM | POA: Diagnosis not present

## 2022-06-24 DIAGNOSIS — M545 Low back pain, unspecified: Secondary | ICD-10-CM | POA: Diagnosis not present

## 2022-06-24 DIAGNOSIS — M25512 Pain in left shoulder: Secondary | ICD-10-CM | POA: Diagnosis not present

## 2022-07-01 DIAGNOSIS — M545 Low back pain, unspecified: Secondary | ICD-10-CM | POA: Diagnosis not present

## 2022-07-01 DIAGNOSIS — M25512 Pain in left shoulder: Secondary | ICD-10-CM | POA: Diagnosis not present

## 2022-07-01 DIAGNOSIS — M797 Fibromyalgia: Secondary | ICD-10-CM | POA: Diagnosis not present

## 2022-07-02 DIAGNOSIS — F419 Anxiety disorder, unspecified: Secondary | ICD-10-CM | POA: Diagnosis not present

## 2022-07-05 ENCOUNTER — Other Ambulatory Visit: Payer: Self-pay | Admitting: Family Medicine

## 2022-07-06 ENCOUNTER — Other Ambulatory Visit (HOSPITAL_COMMUNITY): Payer: Self-pay

## 2022-07-06 ENCOUNTER — Other Ambulatory Visit: Payer: Self-pay

## 2022-07-06 MED ORDER — VILAZODONE HCL 20 MG PO TABS
1.0000 | ORAL_TABLET | Freq: Every day | ORAL | 0 refills | Status: DC
  Filled 2022-07-06: qty 90, 90d supply, fill #0

## 2022-07-15 DIAGNOSIS — M797 Fibromyalgia: Secondary | ICD-10-CM | POA: Diagnosis not present

## 2022-07-15 DIAGNOSIS — M25512 Pain in left shoulder: Secondary | ICD-10-CM | POA: Diagnosis not present

## 2022-07-15 DIAGNOSIS — M545 Low back pain, unspecified: Secondary | ICD-10-CM | POA: Diagnosis not present

## 2022-07-16 DIAGNOSIS — F341 Dysthymic disorder: Secondary | ICD-10-CM | POA: Diagnosis not present

## 2022-07-17 ENCOUNTER — Ambulatory Visit: Payer: Commercial Managed Care - PPO | Admitting: Physical Medicine and Rehabilitation

## 2022-07-22 DIAGNOSIS — M797 Fibromyalgia: Secondary | ICD-10-CM | POA: Diagnosis not present

## 2022-07-22 DIAGNOSIS — M25512 Pain in left shoulder: Secondary | ICD-10-CM | POA: Diagnosis not present

## 2022-07-22 DIAGNOSIS — M545 Low back pain, unspecified: Secondary | ICD-10-CM | POA: Diagnosis not present

## 2022-07-23 NOTE — Progress Notes (Signed)
[name] [address] [place] [address] Phone: [phone] Subjective:   Bruce Donath, am serving as a scribe for Dr. Antoine Primas.  I'm seeing this patient by the request  of:  Pincus Sanes, MD  CC: back and neck pain follow up   BJY:[id number]  [name] is a 47 y.o. female coming in with complaint of back and neck pain. OMT on 06/12/2022 Patient states that R side of SI joint is painful and radiating into the lateral and anterior hip. Painful to drive, sit, sleep on that side, and stand on that leg. Pain for past 2 weeks.   Medications patient has been prescribed: synthroid viibyrd        Reviewed prior external information including notes and imaging from previsou exam, outside providers and external EMR if available.   As well as notes that were available from care everywhere and other healthcare systems.  Past medical history, social, surgical and family history all reviewed in electronic medical record.  No pertanent information unless stated regarding to the chief complaint.   Past Medical History:  Diagnosis Date   Allergy    Autoimmune disorder (HCC)    Crohn's disease (HCC)    Endometriosis    Hypertension    Kidney, medullary sponge    Ovarian cyst    Pre-eclampsia    Raynaud's disease    Stress incontinence in female     Allergies  Allergen Reactions   Lamisil [Terbinafine] Hives and Itching    In pill form     Review of Systems:  No headache, visual changes, nausea, vomiting, diarrhea, constipation, dizziness, abdominal pain, skin rash, fevers, chills, night sweats, weight loss, swollen lymph nodes, body aches, joint swelling, chest pain, shortness of breath, mood changes. POSITIVE muscle aches  Objective  Blood pressure 104/78, pulse 75, height 5\' 3"  (1.6 m), weight 149 lb (67.6 kg), SpO2 96 %.   General: No apparent distress alert and oriented x3 mood and affect normal, dressed  appropriately.  HEENT: Pupils equal, extraocular movements intact  Respiratory: Patient's speak in full sentences and does not appear short of breath  Cardiovascular: No lower extremity edema, non tender, no erythema  Low back severe TTP over right SI joint  Pain over the piriformis area as well.  Osteopathic findings  C2 flexed rotated and side bent right C6 flexed rotated and side bent left T3 extended rotated and side bent right inhaled rib T9 extended rotated and side bent left L2 flexed rotated and side bent right Sacrum right on right   Procedure: Real-time Ultrasound Guided Injection of right sacroiliac joint Device: GE Logiq Q7 Ultrasound guided injection is preferred based studies that show increased duration, increased effect, greater accuracy, decreased procedural pain, increased response rate, and decreased cost with ultrasound guided versus blind injection.  Verbal informed consent obtained.  Time-out conducted.  Noted no overlying erythema, induration, or other signs of local infection.  Skin prepped in a sterile fashion.  Local anesthesia: Topical Ethyl chloride.  With sterile technique and under real time ultrasound guidance: With a 21-gauge 2 inch needle injected with 0.5 cc of 0.5% Marcaine and 0.5 cc of Kenalog 40 mg/mL Completed without difficulty  Pain immediately resolved suggesting accurate placement of the medication.  Advised to call if fevers/chills, erythema, induration, drainage, or persistent bleeding.  Impression: Technically successful ultrasound guided injection.    Assessment and Plan:  SI (sacroiliac) joint dysfunction Chronic problem with worsening symptoms.  Has responded to this previously.  Hopefully will get a long-term improvement again.  Discussed icing regimen and home exercises.  Follow-up with me again in 6 to 8 weeks    Nonallopathic problems  Decision today to treat with OMT was based on Physical Exam  After verbal consent  patient was treated with HVLA, ME, FPR techniques in cervical, rib, thoracic, lumbar, and sacral  areas  Patient tolerated the procedure well with improvement in symptoms  Patient given exercises, stretches and lifestyle modifications  See medications in patient instructions if given  Patient will follow up in 4-8 weeks     The above documentation has been reviewed and is accurate and complete Judi Saa, DO         Note: This dictation was prepared with Dragon dictation along with smaller phrase technology. Any transcriptional errors that result from this process are unintentional.

## 2022-07-24 ENCOUNTER — Ambulatory Visit: Payer: Commercial Managed Care - PPO | Admitting: Family Medicine

## 2022-07-24 ENCOUNTER — Encounter: Payer: Self-pay | Admitting: Family Medicine

## 2022-07-24 ENCOUNTER — Other Ambulatory Visit: Payer: Self-pay

## 2022-07-24 VITALS — BP 104/78 | HR 75 | Ht 63.0 in | Wt 149.0 lb

## 2022-07-24 DIAGNOSIS — M533 Sacrococcygeal disorders, not elsewhere classified: Secondary | ICD-10-CM

## 2022-07-24 DIAGNOSIS — M9903 Segmental and somatic dysfunction of lumbar region: Secondary | ICD-10-CM

## 2022-07-24 DIAGNOSIS — G8929 Other chronic pain: Secondary | ICD-10-CM

## 2022-07-24 DIAGNOSIS — M9902 Segmental and somatic dysfunction of thoracic region: Secondary | ICD-10-CM

## 2022-07-24 DIAGNOSIS — M9904 Segmental and somatic dysfunction of sacral region: Secondary | ICD-10-CM | POA: Diagnosis not present

## 2022-07-24 DIAGNOSIS — M9908 Segmental and somatic dysfunction of rib cage: Secondary | ICD-10-CM

## 2022-07-24 DIAGNOSIS — M9901 Segmental and somatic dysfunction of cervical region: Secondary | ICD-10-CM

## 2022-07-24 NOTE — Patient Instructions (Signed)
Good luck with graduation See me in 6-8 weeks

## 2022-07-24 NOTE — Assessment & Plan Note (Signed)
Chronic problem with worsening symptoms.  Has responded to this previously.  Hopefully will get a long-term improvement again.  Discussed icing regimen and home exercises.  Follow-up with me again in 6 to 8 weeks

## 2022-07-29 DIAGNOSIS — M545 Low back pain, unspecified: Secondary | ICD-10-CM | POA: Diagnosis not present

## 2022-07-29 DIAGNOSIS — M25512 Pain in left shoulder: Secondary | ICD-10-CM | POA: Diagnosis not present

## 2022-07-29 DIAGNOSIS — M797 Fibromyalgia: Secondary | ICD-10-CM | POA: Diagnosis not present

## 2022-08-05 DIAGNOSIS — M797 Fibromyalgia: Secondary | ICD-10-CM | POA: Diagnosis not present

## 2022-08-05 DIAGNOSIS — M25512 Pain in left shoulder: Secondary | ICD-10-CM | POA: Diagnosis not present

## 2022-08-05 DIAGNOSIS — M545 Low back pain, unspecified: Secondary | ICD-10-CM | POA: Diagnosis not present

## 2022-08-10 ENCOUNTER — Other Ambulatory Visit (HOSPITAL_COMMUNITY): Payer: Self-pay

## 2022-08-13 DIAGNOSIS — F341 Dysthymic disorder: Secondary | ICD-10-CM | POA: Diagnosis not present

## 2022-08-13 DIAGNOSIS — F419 Anxiety disorder, unspecified: Secondary | ICD-10-CM | POA: Diagnosis not present

## 2022-08-18 DIAGNOSIS — H524 Presbyopia: Secondary | ICD-10-CM | POA: Diagnosis not present

## 2022-08-20 ENCOUNTER — Ambulatory Visit: Payer: Commercial Managed Care - PPO | Admitting: Dietician

## 2022-08-27 DIAGNOSIS — F341 Dysthymic disorder: Secondary | ICD-10-CM | POA: Diagnosis not present

## 2022-09-02 NOTE — Progress Notes (Addendum)
[name] [address] [place] [address] Phone: [phone] Subjective:   INadine Counts, am serving as a scribe for Dr. Antoine Primas.  I'm seeing this patient by the request  of:  Pincus Sanes, MD  CC: Back and neck pain follow-up  [id number]  Elizebath Lilyann Goetz is a 47 y.o. female coming in with complaint of back and neck pain. OMT 07/24/2022.  Has had more stress recently with family as well as work recently.  He thinks this is causing more of an exacerbation of the underlying fibromyalgia.  Medications patient has been prescribed:   Taking:         Reviewed prior external information including notes and imaging from previsou exam, outside providers and external EMR if available.   As well as notes that were available from care everywhere and other healthcare systems.  Past medical history, social, surgical and family history all reviewed in electronic medical record.  No pertanent information unless stated regarding to the chief complaint.   Past Medical History:  Diagnosis Date   Allergy    Autoimmune disorder (HCC)    Crohn's disease (HCC)    Endometriosis    Hypertension    Kidney, medullary sponge    Ovarian cyst    Pre-eclampsia    Raynaud's disease    Stress incontinence in female     Allergies  Allergen Reactions   Lamisil [Terbinafine] Hives and Itching    In pill form     Review of Systems:  No headache, visual changes, nausea, vomiting, diarrhea, constipation, dizziness, abdominal pain, skin rash, fevers, chills, night sweats, weight loss, swollen lymph nodes, body aches, joint swelling, chest pain, shortness of breath, mood changes. POSITIVE muscle aches  Objective  Blood pressure 120/86, pulse 98, height 5\' 3"  (1.6 m), weight 148 lb (67.1 kg), SpO2 98 %.   General: No apparent distress alert and oriented x3 mood and affect normal, dressed appropriately.  HEENT: Pupils equal, extraocular  movements intact  Respiratory: Patient's speak in full sentences and does not appear short of breath  Cardiovascular: No lower extremity edema, non tender, no erythema  Low back exam does have some loss of lordosis.  Significant trigger points noted in the lumbar area tightness with FABER test.  Patient does have diffuse injury discomfort noted.  Osteopathic findings  C2 flexed rotated and side bent right C6 flexed rotated and side bent left C7 flexed rotated and side bent right T3 extended rotated and side bent right inhaled rib T6 extended rotated and side bent left L2 flexed rotated and side bent right Sacrum right on right   After verbal consent patient was prepped with alcohol swab and with a 25-gauge half inch needle injected with a total of 3 cc of 0.5% Marcaine and 1 cc of Kenalog 40 mg into the paraspinal lumbar musculature with trigger points are noted.  No blood loss.  Band-Aids placed.  Postinjection instructions given.    Assessment and Plan:  Polyarthralgia Patient has had polyarthralgia and fibromyalgia noted.  Discussed with patient different treatment options.  Patient did have a TSH of 2.5 so we will try to increase Synthroid minorly to 75 mcg.  Patient will watch for any type of side effects.  Discussed which activities to do and which ones to avoid.  Patient will try to continue to work on the other modalities such as massage and find more time for herself.  Follow-up with me again 6 to 8  weeks otherwise.    Nonallopathic problems  Decision today to treat with OMT was based on Physical Exam  After verbal consent patient was treated with HVLA, ME, FPR techniques in cervical, rib, thoracic, lumbar, and sacral  areas  Patient tolerated the procedure well with improvement in symptoms  Patient given exercises, stretches and lifestyle modifications  See medications in patient instructions if given  Patient will follow up in 4-8 weeks     The above documentation  has been reviewed and is accurate and complete Judi Saa, DO         Note: This dictation was prepared with Dragon dictation along with smaller phrase technology. Any transcriptional errors that result from this process are unintentional.

## 2022-09-04 ENCOUNTER — Other Ambulatory Visit (HOSPITAL_COMMUNITY): Payer: Self-pay

## 2022-09-04 ENCOUNTER — Encounter: Payer: Self-pay | Admitting: Family Medicine

## 2022-09-04 ENCOUNTER — Ambulatory Visit: Payer: Commercial Managed Care - PPO | Admitting: Family Medicine

## 2022-09-04 ENCOUNTER — Other Ambulatory Visit: Payer: Self-pay

## 2022-09-04 VITALS — BP 120/86 | HR 98 | Ht 63.0 in | Wt 148.0 lb

## 2022-09-04 DIAGNOSIS — M9903 Segmental and somatic dysfunction of lumbar region: Secondary | ICD-10-CM | POA: Diagnosis not present

## 2022-09-04 DIAGNOSIS — M9901 Segmental and somatic dysfunction of cervical region: Secondary | ICD-10-CM

## 2022-09-04 DIAGNOSIS — M2550 Pain in unspecified joint: Secondary | ICD-10-CM

## 2022-09-04 DIAGNOSIS — M9904 Segmental and somatic dysfunction of sacral region: Secondary | ICD-10-CM

## 2022-09-04 DIAGNOSIS — M9908 Segmental and somatic dysfunction of rib cage: Secondary | ICD-10-CM

## 2022-09-04 DIAGNOSIS — M9902 Segmental and somatic dysfunction of thoracic region: Secondary | ICD-10-CM | POA: Diagnosis not present

## 2022-09-04 DIAGNOSIS — M797 Fibromyalgia: Secondary | ICD-10-CM | POA: Diagnosis not present

## 2022-09-04 DIAGNOSIS — M5459 Other low back pain: Secondary | ICD-10-CM

## 2022-09-04 MED ORDER — LEVOTHYROXINE SODIUM 75 MCG PO TABS
75.0000 ug | ORAL_TABLET | Freq: Every day | ORAL | 0 refills | Status: DC
  Filled 2022-09-04: qty 30, 30d supply, fill #0
  Filled 2022-09-04: qty 60, 60d supply, fill #0
  Filled 2022-09-04: qty 90, 90d supply, fill #0
  Filled 2022-09-16: qty 60, 60d supply, fill #1

## 2022-09-04 NOTE — Assessment & Plan Note (Signed)
Patient has had polyarthralgia and fibromyalgia noted.  Discussed with patient different treatment options.  Patient did have a TSH of 2.5 so we will try to increase Synthroid minorly to 75 mcg.  Patient will watch for any type of side effects.  Discussed which activities to do and which ones to avoid.  Patient will try to continue to work on the other modalities such as massage and find more time for herself.  Follow-up with me again 6 to 8 weeks otherwise.

## 2022-09-04 NOTE — Assessment & Plan Note (Signed)
Injections given today and tolerated the procedure well, discussed icing regimen and home exercises.  Discussed monitoring for any type of infectious etiology.  Follow-up with me again in 6 to 8 weeks

## 2022-09-04 NOTE — Patient Instructions (Addendum)
Trigger point injections today Good to see you! Increased Synthroid to  See you again in 6-8 weeks

## 2022-09-10 DIAGNOSIS — F341 Dysthymic disorder: Secondary | ICD-10-CM | POA: Diagnosis not present

## 2022-09-15 ENCOUNTER — Other Ambulatory Visit (HOSPITAL_COMMUNITY): Payer: Self-pay

## 2022-09-16 ENCOUNTER — Other Ambulatory Visit (HOSPITAL_COMMUNITY): Payer: Self-pay

## 2022-09-20 ENCOUNTER — Other Ambulatory Visit: Payer: Self-pay | Admitting: Internal Medicine

## 2022-09-21 ENCOUNTER — Other Ambulatory Visit (HOSPITAL_COMMUNITY): Payer: Self-pay

## 2022-09-21 MED ORDER — TRAMADOL HCL 50 MG PO TABS
50.0000 mg | ORAL_TABLET | Freq: Two times a day (BID) | ORAL | 0 refills | Status: DC | PRN
  Filled 2022-09-21: qty 30, 15d supply, fill #0

## 2022-09-22 ENCOUNTER — Other Ambulatory Visit: Payer: Self-pay

## 2022-09-22 ENCOUNTER — Other Ambulatory Visit (HOSPITAL_COMMUNITY): Payer: Self-pay

## 2022-09-23 DIAGNOSIS — M545 Low back pain, unspecified: Secondary | ICD-10-CM | POA: Diagnosis not present

## 2022-09-23 DIAGNOSIS — M25512 Pain in left shoulder: Secondary | ICD-10-CM | POA: Diagnosis not present

## 2022-09-23 DIAGNOSIS — M797 Fibromyalgia: Secondary | ICD-10-CM | POA: Diagnosis not present

## 2022-09-24 ENCOUNTER — Other Ambulatory Visit (HOSPITAL_COMMUNITY): Payer: Self-pay

## 2022-09-24 DIAGNOSIS — F419 Anxiety disorder, unspecified: Secondary | ICD-10-CM | POA: Diagnosis not present

## 2022-09-28 ENCOUNTER — Other Ambulatory Visit (HOSPITAL_COMMUNITY): Payer: Self-pay

## 2022-10-01 ENCOUNTER — Other Ambulatory Visit (HOSPITAL_COMMUNITY): Payer: Self-pay

## 2022-10-01 DIAGNOSIS — N951 Menopausal and female climacteric states: Secondary | ICD-10-CM | POA: Diagnosis not present

## 2022-10-01 DIAGNOSIS — Z79890 Hormone replacement therapy (postmenopausal): Secondary | ICD-10-CM | POA: Diagnosis not present

## 2022-10-06 ENCOUNTER — Other Ambulatory Visit: Payer: Self-pay | Admitting: Internal Medicine

## 2022-10-06 ENCOUNTER — Other Ambulatory Visit: Payer: Self-pay | Admitting: Family Medicine

## 2022-10-06 ENCOUNTER — Other Ambulatory Visit (HOSPITAL_COMMUNITY): Payer: Self-pay

## 2022-10-06 MED ORDER — AMLODIPINE BESYLATE 5 MG PO TABS
5.0000 mg | ORAL_TABLET | Freq: Every day | ORAL | 1 refills | Status: DC
  Filled 2022-10-06: qty 90, 90d supply, fill #0
  Filled 2023-01-03: qty 90, 90d supply, fill #1

## 2022-10-06 MED ORDER — VILAZODONE HCL 20 MG PO TABS
1.0000 | ORAL_TABLET | Freq: Every day | ORAL | 0 refills | Status: DC
  Filled 2022-10-06: qty 90, 90d supply, fill #0

## 2022-10-08 ENCOUNTER — Other Ambulatory Visit (HOSPITAL_COMMUNITY): Payer: Self-pay

## 2022-10-09 ENCOUNTER — Encounter: Payer: Commercial Managed Care - PPO | Admitting: Physical Medicine and Rehabilitation

## 2022-10-12 DIAGNOSIS — M545 Low back pain, unspecified: Secondary | ICD-10-CM | POA: Diagnosis not present

## 2022-10-12 DIAGNOSIS — M797 Fibromyalgia: Secondary | ICD-10-CM | POA: Diagnosis not present

## 2022-10-12 DIAGNOSIS — M25512 Pain in left shoulder: Secondary | ICD-10-CM | POA: Diagnosis not present

## 2022-10-15 NOTE — Progress Notes (Signed)
[name] [address] [place] [address] Phone: [phone] Subjective:   INadine Counts, am serving as a scribe for Dr. Antoine Primas.  I'm seeing this patient by the request  of:  Pincus Sanes, MD  CC: Back pain follow-up  [id number]  [name] is a 47 y.o. female coming in with complaint of back and neck pain. OMT on 09/04/2022. Patient states that R SI joint pain the is intermittent. Has some pain that wraps around the hip, into the groin, and into the quad.   Medications patient has been prescribed: Viibyrd  Taking:  We increased synthroid to       Reviewed prior external information including notes and imaging from previsou exam, outside providers and external EMR if available.   As well as notes that were available from care everywhere and other healthcare systems.  Past medical history, social, surgical and family history all reviewed in electronic medical record.  No pertanent information unless stated regarding to the chief complaint.   Past Medical History:  Diagnosis Date   Allergy    Autoimmune disorder (HCC)    Crohn's disease (HCC)    Endometriosis    Hypertension    Kidney, medullary sponge    Ovarian cyst    Pre-eclampsia    Raynaud's disease    Stress incontinence in female     Allergies  Allergen Reactions   Lamisil [Terbinafine] Hives and Itching    In pill form     Review of Systems:  No headache, visual changes, nausea, vomiting, diarrhea, constipation, dizziness, abdominal pain, skin rash, fevers, chills, night sweats, weight loss, swollen lymph nodes, body aches, joint swelling, chest pain, shortness of breath, mood changes. POSITIVE muscle aches  Objective  Blood pressure 114/82, pulse 94, height 5\' 3"  (1.6 m), weight 147 lb (66.7 kg), SpO2 97%.   General: No apparent distress alert and oriented x3 mood and affect normal, dressed appropriately.  HEENT: Pupils equal,  extraocular movements intact  Respiratory: Patient's speak in full sentences and does not appear short of breath  Cardiovascular: No lower extremity edema, non tender, no erythema  Low back does have some loss of lordosis on the right side.  Severe tenderness to palpation noted.  Osteopathic findings  C2 flexed rotated and side bent right C6 flexed rotated and side bent left T3 extended rotated and side bent right inhaled rib L2 flexed rotated and side bent right L5 flexed rotated and side bent left. Sacrum right on right       Assessment and Plan:  SI (sacroiliac) joint dysfunction Chronic problem with worsening symptoms.  Has attempted another injection.  Hopeful that this is helped as well as increasing the Synthroid.  Patient is doing relatively well otherwise.  Follow-up again in 6 to 8 weeks otherwise.    Nonallopathic problems  Decision today to treat with OMT was based on Physical Exam  After verbal consent patient was treated with HVLA, ME, FPR techniques in cervical, rib, thoracic, lumbar, and sacral  areas  Patient tolerated the procedure well with improvement in symptoms  Patient given exercises, stretches and lifestyle modifications  See medications in patient instructions if given  Patient will follow up in 4-8 weeks    The above documentation has been reviewed and is accurate and complete Judi Saa, DO          Note: This dictation was prepared with Dragon dictation along with smaller phrase technology. Any  transcriptional errors that result from this process are unintentional.

## 2022-10-16 ENCOUNTER — Encounter: Payer: Self-pay | Admitting: Family Medicine

## 2022-10-16 ENCOUNTER — Other Ambulatory Visit: Payer: Self-pay

## 2022-10-16 ENCOUNTER — Other Ambulatory Visit (HOSPITAL_COMMUNITY): Payer: Self-pay

## 2022-10-16 ENCOUNTER — Ambulatory Visit: Payer: Commercial Managed Care - PPO | Admitting: Family Medicine

## 2022-10-16 VITALS — BP 114/82 | HR 94 | Ht 63.0 in | Wt 147.0 lb

## 2022-10-16 DIAGNOSIS — M9908 Segmental and somatic dysfunction of rib cage: Secondary | ICD-10-CM | POA: Diagnosis not present

## 2022-10-16 DIAGNOSIS — F419 Anxiety disorder, unspecified: Secondary | ICD-10-CM | POA: Diagnosis not present

## 2022-10-16 DIAGNOSIS — M9901 Segmental and somatic dysfunction of cervical region: Secondary | ICD-10-CM | POA: Diagnosis not present

## 2022-10-16 DIAGNOSIS — F341 Dysthymic disorder: Secondary | ICD-10-CM | POA: Diagnosis not present

## 2022-10-16 DIAGNOSIS — M533 Sacrococcygeal disorders, not elsewhere classified: Secondary | ICD-10-CM

## 2022-10-16 DIAGNOSIS — M9902 Segmental and somatic dysfunction of thoracic region: Secondary | ICD-10-CM | POA: Diagnosis not present

## 2022-10-16 DIAGNOSIS — M9903 Segmental and somatic dysfunction of lumbar region: Secondary | ICD-10-CM | POA: Diagnosis not present

## 2022-10-16 DIAGNOSIS — M9904 Segmental and somatic dysfunction of sacral region: Secondary | ICD-10-CM

## 2022-10-16 MED ORDER — GABAPENTIN 100 MG PO CAPS
200.0000 mg | ORAL_CAPSULE | Freq: Every day | ORAL | 0 refills | Status: DC
  Filled 2022-10-16 (×2): qty 180, 90d supply, fill #0

## 2022-10-16 NOTE — Patient Instructions (Signed)
Enjoy the beach Keep working on that butt cheek Gabapentin 200mg  See me in 6-8 weeks

## 2022-10-16 NOTE — Assessment & Plan Note (Addendum)
Chronic problem with worsening symptoms.  Has attempted another injection.  Hopeful that this is helped as well as increasing the Synthroid.  Patient is doing relatively well otherwise.  Follow-up again in 6 to 8 weeks otherwise.  Due to some more pain at night though patient was given gabapentin and we will see how patient responds.  New medication.

## 2022-10-27 ENCOUNTER — Encounter: Payer: Commercial Managed Care - PPO | Attending: Family Medicine | Admitting: Dietician

## 2022-10-27 ENCOUNTER — Encounter: Payer: Self-pay | Admitting: Dietician

## 2022-10-27 ENCOUNTER — Encounter: Payer: Self-pay | Admitting: Family Medicine

## 2022-10-27 VITALS — Wt 151.0 lb

## 2022-10-27 DIAGNOSIS — Z713 Dietary counseling and surveillance: Secondary | ICD-10-CM | POA: Insufficient documentation

## 2022-10-27 NOTE — Patient Instructions (Addendum)
Goal: go walking on lunch break for 15 minutes 3 times per week.   Goal: get PT exercises in 3 days per week.   Goal: drink 2 of your water bottles daily (total 48 oz).   Goal: increase non-starchy vegetable intake at lunch and dinner.   Other Tips:  When snacking, aim to include a complex carb and protein.  At meals, aim to include 1/2 plate non-starchy vegetables, 1/4 plate protein, and 1/4 plate complex carbs.

## 2022-10-27 NOTE — Progress Notes (Signed)
Medical Nutrition Therapy  Appointment Start time:  1535  Appointment End time:  1630  Primary concerns today: bringing A1c down   Referral diagnosis: prediabetes Preferred learning style: no preference indicated Learning readiness: ready   NUTRITION ASSESSMENT   Anthropometrics  Wt: 151 lbs  Clinical Medical Hx: allergies, HTN, kidney disease, fibromyalgia Medications: reviewed, levothyroxine Labs: 06/19/22 A1c 5.7 Notable Signs/Symptoms: constipation, heartburn Food Allergies: not assessed  Lifestyle & Dietary Hx  Pt states she works at [employer]. She states they get an hour lunch break and have occasionally gone walking for 10-15 minutes on it but inconsistently.   Pt states she has done weight watchers and noom app.   Pt states constipation started when fibromyalgia did but she notices if she eats more fruit it helps.   Pt states she usually has breakfast in the car which is a protein bar but she has been trying to cook at home more often. Pt states she also cooks dinner 4 times per week and usually packs lunch but will sometimes get chipotle.   Pt states her kids live with her (29, 36, and 53 years old), and one of her kids has been depressed which has been bringing her stress. She states her kids eat more processed food.   Pt states she used to drink more soda but quit and only does it now when she goes out to eat or is on vacation.   Pt states she goes to PT every other week.   Estimated daily fluid intake: 24 oz (with crystal light) Supplements: fiber gummies, vitamin D, adrenal support, ginseng and ginkgo biloba Sleep: 7 hours, states fibromyalgia can make sleeping difficult Stress / self-care: high stress (pt states has depressive child which brings her stress, fibromyalgia brings her stress too), pt states she goes to therapy Current average weekly physical activity: does some physical therapy  24-Hr Dietary Recall First Meal: pure protein bar OR eggs and  grits Snack: none or fruit Second Meal: leftovers (protein starch and veggies) OR chiptole Snack: none Third Meal: protein starch and veggies Snack: 4 oreos Beverages: 24 oz water with crystal light, half and half sweet tea 1x/wk, soda 1x/wk,    NUTRITION DIAGNOSIS  Terral-2.2 Altered nutrition-related laboratory As related to prediabetes.  As evidenced by A1c 5.7.   NUTRITION INTERVENTION  Nutrition education (E-1) on the following topics:   MyPlate: Fruits & Vegetables: Aim to fill half your plate with a variety of fruits and vegetables. They are rich in vitamins, minerals, and fiber, and can help reduce the risk of chronic diseases. Choose a colorful assortment of fruits and vegetables to ensure you get a wide range of nutrients. Grains and Starches: Make at least half of your grain choices whole grains, such as brown rice, whole wheat bread, and oats. Whole grains provide fiber, which aids in digestion and healthy cholesterol levels. Aim for whole forms of starchy vegetables such as potatoes, sweet potatoes, beans, peas, and corn, which are fiber rich and provide many vitamins and minerals.  Protein: Incorporate lean sources of protein, such as poultry, fish, beans, nuts, and seeds, into your meals. Protein is essential for building and repairing tissues, staying full, balancing blood sugar, as well as supporting immune function. Dairy: Include low-fat or fat-free dairy products like milk, yogurt, and cheese in your diet. Dairy foods are excellent sources of calcium and vitamin D, which are crucial for bone health.   Prediabetes: Prediabetes: Prediabetes is a condition where blood sugar levels  are higher than normal but not yet high enough to be diagnosed as type 2 diabetes. A1C, or hemoglobin A1c, is a blood test that provides an average of a person's blood sugar levels over the past two to three months. It is commonly used to diagnose and monitor diabetes. For prediabetes, an A1C level  between 5.7% and 6.4% typically is used to diagnose this. Here is how the A1C levels are generally categorized: Normal:  A1C below 5.7% Prediabetes:  A1C between 5.7% and 6.4% Diabetes:  A1C of 6.5% or higher When diagnosed with prediabetes, there are several lifestyle changes you can make to manage the condition: Healthy Eating:  Follow a well-balanced diet that includes a variety of fruits, vegetables, whole grains, lean proteins, and healthy fats. Monitor portion sizes and reduce intake of sugary and processed foods. Regular Physical Activity:  Engage in regular physical activity, such as brisk walking, cycling, or other aerobic exercises, for at least 150 minutes per week. Include strength training exercises at least twice a week.  Handouts Provided Include  Plate Method  Learning Style & Readiness for Change Teaching method utilized: Visual & Auditory  Demonstrated degree of understanding via: Teach Back  Barriers to learning/adherence to lifestyle change: none  Goals Established by Pt  Goal: go walking on lunch break for 15 minutes 3 times per week.   Goal: get PT exercises in 3 days per week.   Goal: drink 2 of your water bottles daily (total 48 oz).   Goal: increase non-starchy vegetable intake at lunch and dinner.   Other Tips:  When snacking, aim to include a complex carb and protein.  At meals, aim to include 1/2 plate non-starchy vegetables, 1/4 plate protein, and 1/4 plate complex carbs.    MONITORING & EVALUATION Dietary intake, weekly physical activity, and follow up in 3 months.  Next Steps  Patient is to call for questions.

## 2022-10-28 DIAGNOSIS — M797 Fibromyalgia: Secondary | ICD-10-CM | POA: Diagnosis not present

## 2022-10-28 DIAGNOSIS — M25512 Pain in left shoulder: Secondary | ICD-10-CM | POA: Diagnosis not present

## 2022-10-28 DIAGNOSIS — M545 Low back pain, unspecified: Secondary | ICD-10-CM | POA: Diagnosis not present

## 2022-10-30 ENCOUNTER — Encounter: Payer: Commercial Managed Care - PPO | Admitting: Physical Medicine and Rehabilitation

## 2022-10-30 DIAGNOSIS — F419 Anxiety disorder, unspecified: Secondary | ICD-10-CM | POA: Diagnosis not present

## 2022-10-30 DIAGNOSIS — F341 Dysthymic disorder: Secondary | ICD-10-CM | POA: Diagnosis not present

## 2022-10-31 ENCOUNTER — Other Ambulatory Visit: Payer: Self-pay | Admitting: Family Medicine

## 2022-10-31 ENCOUNTER — Encounter: Payer: Self-pay | Admitting: Family Medicine

## 2022-11-01 MED ORDER — TIZANIDINE HCL 4 MG PO TABS
4.0000 mg | ORAL_TABLET | Freq: Every day | ORAL | 0 refills | Status: AC
  Filled 2022-11-01: qty 30, 30d supply, fill #0

## 2022-11-02 ENCOUNTER — Other Ambulatory Visit (HOSPITAL_COMMUNITY): Payer: Self-pay

## 2022-11-02 ENCOUNTER — Other Ambulatory Visit: Payer: Self-pay

## 2022-11-02 MED ORDER — PREDNISONE 50 MG PO TABS
50.0000 mg | ORAL_TABLET | Freq: Every day | ORAL | 0 refills | Status: DC
  Filled 2022-11-02: qty 5, 5d supply, fill #0

## 2022-11-09 ENCOUNTER — Encounter: Payer: Self-pay | Admitting: Internal Medicine

## 2022-11-10 ENCOUNTER — Encounter: Payer: Self-pay | Admitting: Family Medicine

## 2022-11-11 ENCOUNTER — Ambulatory Visit (INDEPENDENT_AMBULATORY_CARE_PROVIDER_SITE_OTHER): Payer: Commercial Managed Care - PPO

## 2022-11-11 ENCOUNTER — Ambulatory Visit: Payer: Commercial Managed Care - PPO | Admitting: Podiatry

## 2022-11-11 ENCOUNTER — Encounter: Payer: Self-pay | Admitting: Podiatry

## 2022-11-11 DIAGNOSIS — M9908 Segmental and somatic dysfunction of rib cage: Secondary | ICD-10-CM | POA: Diagnosis not present

## 2022-11-11 DIAGNOSIS — M79672 Pain in left foot: Secondary | ICD-10-CM

## 2022-11-11 DIAGNOSIS — M9903 Segmental and somatic dysfunction of lumbar region: Secondary | ICD-10-CM

## 2022-11-11 DIAGNOSIS — M9904 Segmental and somatic dysfunction of sacral region: Secondary | ICD-10-CM | POA: Diagnosis not present

## 2022-11-11 DIAGNOSIS — M79671 Pain in right foot: Secondary | ICD-10-CM

## 2022-11-11 DIAGNOSIS — M9902 Segmental and somatic dysfunction of thoracic region: Secondary | ICD-10-CM

## 2022-11-11 DIAGNOSIS — M2141 Flat foot [pes planus] (acquired), right foot: Secondary | ICD-10-CM

## 2022-11-11 DIAGNOSIS — M9901 Segmental and somatic dysfunction of cervical region: Secondary | ICD-10-CM

## 2022-11-11 DIAGNOSIS — M25512 Pain in left shoulder: Secondary | ICD-10-CM | POA: Diagnosis not present

## 2022-11-11 DIAGNOSIS — M2142 Flat foot [pes planus] (acquired), left foot: Secondary | ICD-10-CM | POA: Diagnosis not present

## 2022-11-11 DIAGNOSIS — M797 Fibromyalgia: Secondary | ICD-10-CM | POA: Diagnosis not present

## 2022-11-11 DIAGNOSIS — M545 Low back pain, unspecified: Secondary | ICD-10-CM | POA: Diagnosis not present

## 2022-11-11 MED ORDER — METHYLPREDNISOLONE ACETATE 80 MG/ML IJ SUSP
80.0000 mg | Freq: Once | INTRAMUSCULAR | Status: AC
Start: 2022-11-11 — End: 2022-11-11
  Administered 2022-11-11: 80 mg via INTRAMUSCULAR

## 2022-11-11 MED ORDER — KETOROLAC TROMETHAMINE 60 MG/2ML IM SOLN
60.0000 mg | Freq: Once | INTRAMUSCULAR | Status: AC
Start: 2022-11-11 — End: 2022-11-11
  Administered 2022-11-11: 60 mg via INTRAMUSCULAR

## 2022-11-11 NOTE — Progress Notes (Signed)
  Subjective:  Patient ID: [name], female    DOB: [birth year],   MRN: [id number]  Chief Complaint  Patient presents with   Foot Pain    np Custom orthotics for foot, knee, hip, back, b/l  feet pain    47 y.o. female presents for concern of hip knee and back pain and hoping for orthtoics that can help with some of these problems. Relates her feet have always been flat and has orthotics in the past that have helped. Interesting in new orthotics.   . Denies any other pedal complaints. Denies n/v/f/c.   Past Medical History:  Diagnosis Date   Allergy    Autoimmune disorder (HCC)    Crohn's disease (HCC)    Endometriosis    Hypertension    Kidney, medullary sponge    Ovarian cyst    Pre-eclampsia    Raynaud's disease    Stress incontinence in female     Objective:  Physical Exam: Vascular: DP/PT pulses 2/4 bilateral. CFT <3 seconds. Normal hair growth on digits. No edema.  Skin. No lacerations or abrasions bilateral feet.  Musculoskeletal: MMT 5/5 bilateral lower extremities in DF, PF, Inversion and Eversion. Deceased ROM in DF of ankle joint. Bilateral collapse of the medial arch with too many toes signs and increaed eversion of the STJ Neurological: Sensation intact to light touch.   Assessment:   1. Bilateral pes planus      Plan:  Patient was evaluated and treated and all questions answered. -Xrays reviewed. Bilateral collapse of the medial arch more so on the right.  -Discussed treatement options; discussed pes planus deformity;conservative and  surgical  -Rx Orthotics will schedule for fittin.  -Recommend good supportive shoes -Recommend daily stretching and icing -Recommend Children's Motrin or Tylenol as needed -Patient to return to office as needed or sooner if condition worsens.   Louann Sjogren, DPM

## 2022-11-11 NOTE — Telephone Encounter (Signed)
Left message for patient to callback to schedule nurse visit.

## 2022-11-11 NOTE — Progress Notes (Signed)
Patient received Depo 80mg  and Toradol 60mg  per Dr. Katrinka Blazing. Patient tolerated injection well.

## 2022-11-13 ENCOUNTER — Other Ambulatory Visit: Payer: Self-pay

## 2022-11-13 ENCOUNTER — Encounter: Payer: Self-pay | Admitting: *Deleted

## 2022-11-13 ENCOUNTER — Other Ambulatory Visit (HOSPITAL_COMMUNITY): Payer: Self-pay

## 2022-11-13 ENCOUNTER — Ambulatory Visit
Admission: EM | Admit: 2022-11-13 | Discharge: 2022-11-13 | Disposition: A | Discharge status: Home / Self Care | Payer: Commercial Managed Care - PPO | Attending: Family Medicine | Admitting: Family Medicine

## 2022-11-13 DIAGNOSIS — R197 Diarrhea, unspecified: Secondary | ICD-10-CM | POA: Diagnosis not present

## 2022-11-13 DIAGNOSIS — R638 Other symptoms and signs concerning food and fluid intake: Secondary | ICD-10-CM | POA: Diagnosis not present

## 2022-11-13 DIAGNOSIS — N3000 Acute cystitis without hematuria: Secondary | ICD-10-CM | POA: Insufficient documentation

## 2022-11-13 DIAGNOSIS — Z9189 Other specified personal risk factors, not elsewhere classified: Secondary | ICD-10-CM

## 2022-11-13 DIAGNOSIS — R300 Dysuria: Secondary | ICD-10-CM | POA: Diagnosis present

## 2022-11-13 LAB — POCT URINALYSIS DIP (MANUAL ENTRY)
Bilirubin, UA: NEGATIVE
Blood, UA: NEGATIVE
Glucose, UA: NEGATIVE mg/dL
Ketones, POC UA: NEGATIVE mg/dL
Nitrite, UA: NEGATIVE
Protein Ur, POC: NEGATIVE mg/dL
Spec Grav, UA: 1.015 (ref 1.010–1.025)
Urobilinogen, UA: 0.2 U/dL
pH, UA: 6 (ref 5.0–8.0)

## 2022-11-13 MED ORDER — SULFAMETHOXAZOLE-TRIMETHOPRIM 800-160 MG PO TABS
1.0000 | ORAL_TABLET | Freq: Two times a day (BID) | ORAL | 0 refills | Status: AC
Start: 2022-11-13 — End: 2022-11-16
  Filled 2022-11-13: qty 6, 3d supply, fill #0

## 2022-11-13 NOTE — Discharge Instructions (Addendum)
Instructed patient to take medication as directed with food to completion.  Advised patient to adhere to a bland/brat (banana, rice/rice pudding, applesauce, toast) diet for the next 2 to 3 days gradually returning to normal diet.  Encouraged increase daily water intake to 64 ounces per day may include Gatorade zero for cardiac electrolyte replacement.  Advised we will follow-up with urine culture results once received.  Advised if symptoms worsen and/or unresolved please follow-up PCP or here for further evaluation.

## 2022-11-13 NOTE — ED Provider Notes (Signed)
[facility]    CSN: [id number] Arrival date & time: 11/13/22  0849      History   Chief Complaint Chief Complaint  Patient presents with   Diarrhea    HPI [name] is a 47 y.o. female.   HPI 47 year old female presents with diarrhea after eating leftover lunch with fruit cup and salmon.  Patient reports 7 episodes of diarrhea and using OTC Imodium and drinking Gatorade.  Past Medical History:  Diagnosis Date   Allergy    Autoimmune disorder (HCC)    Crohn's disease (HCC)    Endometriosis    Hypertension    Kidney, medullary sponge    Ovarian cyst    Pre-eclampsia    Raynaud's disease    Stress incontinence in female     Patient Active Problem List   Diagnosis Date Noted   Myofascial pain syndrome 06/02/2021   Hearing loss, mild right ear only 04/25/2021   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   Chronic pain syndrome 10/04/2020   Fibromyalgia 03/12/2020   Piriformis syndrome, left 11/27/2019   Tremor 03/16/2019   Prediabetes 02/09/2019   Hypothyroidism (acquired) 08/02/2018   Decreased cortisol level (HCC) 04/13/2018   Amenorrhea, unspecified 02/06/2018   Chronic fatigue 02/06/2018   Nonallopathic lesion of rib cage 10/08/2017   Antalgic gait 04/19/2017   Polyarthralgia 02/26/2016   GERD (gastroesophageal reflux disease) 02/17/2016   Cervical lymphadenopathy 01/21/2016   Nonallopathic lesion of thoracic region 08/23/2015   Lumbar radiculopathy 08/02/2015   SI (sacroiliac) joint dysfunction 03/20/2015   Scapular dysfunction 03/20/2015   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   Tendinopathy of right gluteal region 06/29/2014   Nonallopathic lesion of lumbosacral region 06/29/2014   Nonallopathic lesion of sacral region 06/29/2014   Nonallopathic lesion of pelvic region 06/29/2014   Biomechanical lesion, unspecified 06/29/2014   Recurrent UTI 05/18/2013   Lumbar trigger point syndrome 10/07/2010   Allergic rhinitis 08/14/2010   Essential  hypertension, benign 01/22/2010    Past Surgical History:  Procedure Laterality Date   CHOLECYSTECTOMY     DILATION AND CURETTAGE OF UTERUS     ENDOMETRIAL ABLATION     NOVASURE ABLATION     TUBAL LIGATION      OB History   No obstetric history on file.      Home Medications    Prior to Admission medications   Medication Sig Start Date End Date Taking? Authorizing Provider  amLODipine (NORVASC) 5 MG tablet Take 1 tablet (5 mg total) by mouth daily. 10/06/22  Yes Burns, Bobette Mo, MD  Estradiol-Progesterone 1-100 MG CAPS Take 1 capsule by mouth daily at 2 am.   Yes [provider]  sulfamethoxazole-trimethoprim (BACTRIM DS) 800-160 MG tablet Take 1 tablet by mouth 2 (two) times daily for 3 days. 11/13/22 11/16/22 Yes Trevor Iha, FNP  acetaminophen (TYLENOL) 500 MG tablet Take 1,000 mg by mouth every 6 (six) hours as needed for mild pain or headache.    [provider]  Acetaminophen-Caffeine (EXCEDRIN TENSION HEADACHE) 500-65 MG TABS Take 2 tablets by mouth daily as needed (headache).    [provider]  Cholecalciferol (VITAMIN D3) 2000 units TABS Take 2,000 Units by mouth daily.    [provider]  docusate sodium (COLACE) 100 MG capsule Take 100 mg by mouth daily.    [provider]  fluticasone (FLONASE) 50 MCG/ACT nasal spray Place 2 sprays into both nostrils daily. 08/02/18   Pincus Sanes, MD  gabapentin (NEURONTIN) 100 MG capsule Take 2 capsules (  200 mg total) by mouth at bedtime. 10/16/22   Judi Saa, DO  levothyroxine (SYNTHROID) 50 MCG tablet Take 1 tablet (50 mcg total) by mouth daily. 06/23/22   Judi Saa, DO  montelukast (SINGULAIR) 10 MG tablet Take 1 tablet (10 mg total) by mouth at bedtime. 01/23/22   Judi Saa, DO  multivitamin-lutein (OCUVITE-LUTEIN) CAPS capsule Take 1 capsule by mouth daily.    [provider]  nitrofurantoin (MACRODANTIN) 50 MG capsule Take as directed after intercourse 06/20/22    Pincus Sanes, MD  omeprazole (PRILOSEC) 20 MG capsule Take 20 mg by mouth daily.    [provider]  Prasterone, DHEA, (DHEA ADVANCED FORMULA PO) Take 1 tablet by mouth daily.     [provider]  predniSONE (DELTASONE) 50 MG tablet Take 1 tablet (50 mg total) by mouth daily. 11/02/22   Judi Saa, DO  pregabalin (LYRICA) 100 MG capsule Take 1 capsule (100 mg total) by mouth 2 (two) times daily. For nerve pain/fibromyalgia 04/17/22   Lovorn, Aundra Millet, MD  Propylene Glycol (SYSTANE COMPLETE OP) Place 1 drop into both eyes daily.    [provider]  tiZANidine (ZANAFLEX) 4 MG tablet Take 1 tablet (4 mg total) by mouth at bedtime. 11/01/22   Judi Saa, DO  traMADol (ULTRAM) 50 MG tablet Take 1 tablet (50 mg total) by mouth every 12 (twelve) hours as needed for moderate pain 09/21/22   Crain, Whitney L, PA  Vilazodone HCl (VIIBRYD) 20 MG TABS Take 1 tablet (20 mg total) by mouth daily. 10/06/22   Judi Saa, DO    Family History Family History  Problem Relation Age of Onset   Breast cancer Mother    Hypertension Father    Hyperlipidemia Father    Other Brother        lyme-chronic   Stroke Maternal Grandmother    Alcohol abuse Paternal Grandmother    Heart disease Paternal Grandfather    Colon cancer Neg Hx    Esophageal cancer Neg Hx    Rectal cancer Neg Hx     Social History Social History   Tobacco Use   Smoking status: Never   Smokeless tobacco: Never  Vaping Use   Vaping status: Never Used  Substance Use Topics   Alcohol use: Yes   Drug use: No     Allergies   Lamisil [terbinafine]   Review of Systems Review of Systems  Gastrointestinal:  Positive for diarrhea.     Physical Exam Triage Vital Signs ED Triage Vitals  Encounter Vitals Group     BP 11/13/22 0936 (!) 134/91     Systolic BP Percentile --      Diastolic BP Percentile --      Pulse Rate 11/13/22 0936 (!) 108     Resp 11/13/22 0936 18     Temp 11/13/22 0936  98.2 F (36.8 C)     Temp Source 11/13/22 0936 Oral     SpO2 11/13/22 0936 100 %     Weight --      Height --      Head Circumference --      Peak Flow --      Pain Score 11/13/22 0941 4     Pain Loc --      Pain Education --      Exclude from Growth Chart --    No data found.  Updated Vital Signs BP (!) 134/91 (BP Location: Right Arm)  Pulse (!) 108   Temp 98.2 F (36.8 C) (Oral)   Resp 18   SpO2 100%    Physical Exam Vitals and nursing note reviewed.  Constitutional:      Appearance: Normal appearance. She is normal weight.  HENT:     Head: Normocephalic and atraumatic.     Mouth/Throat:     Mouth: Mucous membranes are moist.     Pharynx: Oropharynx is clear.  Eyes:     Extraocular Movements: Extraocular movements intact.     Conjunctiva/sclera: Conjunctivae normal.     Pupils: Pupils are equal, round, and reactive to light.  Cardiovascular:     Rate and Rhythm: Normal rate and regular rhythm.     Pulses: Normal pulses.     Heart sounds: Normal heart sounds.  Pulmonary:     Effort: Pulmonary effort is normal.     Breath sounds: Normal breath sounds. No wheezing, rhonchi or rales.  Abdominal:     Tenderness: There is no right CVA tenderness or left CVA tenderness.  Musculoskeletal:        General: Normal range of motion.     Cervical back: Normal range of motion and neck supple.  Skin:    General: Skin is warm and dry.  Neurological:     General: No focal deficit present.     Mental Status: She is alert and oriented to person, place, and time. Mental status is at baseline.  Psychiatric:        Mood and Affect: Mood normal.        Behavior: Behavior normal.      UC Treatments / Results  Labs (all labs ordered are listed, but only abnormal results are displayed) Labs Reviewed  POCT URINALYSIS DIP (MANUAL ENTRY) - Abnormal; Notable for the following components:      Result Value   Leukocytes, UA Trace (*)    All other components within normal limits   URINE CULTURE    EKG   Radiology DG Foot Complete Left  Result Date: 11/11/2022 Please see detailed radiograph report in office note.  DG Foot Complete Right  Result Date: 11/11/2022 Please see detailed radiograph report in office note.   Procedures Procedures (including critical care time)  Medications Ordered in UC Medications - No data to display  Initial Impression / Assessment and Plan / UC Course  I have reviewed the triage vital signs and the nursing notes.  Pertinent labs & imaging results that were available during my care of the patient were reviewed by me and considered in my medical decision making (see chart for details).     MDM: 1.  Acute cystitis without hematuria-UA revealed above, urine culture ordered Rx'd Bactrim DS 800/160 mg twice daily x 3 days; 2.  Diarrhea-advised patient to adhere to bland/brat (banana, rice/rice pudding, applesauce, toast) diet for the next 2 to 3 days gradually returning to normal diet. Encouraged increase daily water intake to 64 ounces per day may include Gatorade zero for cardiac electrolyte replacement.  Advised we will follow-up with urine culture results once received.  3.  Potential for food poisoning-reassured patient symptoms should resolve in the next 36 to 48 hours.  Work note provided to patient prior to discharge today.  Advised if symptoms worsen and/or unresolved please follow-up PCP or here for further evaluation.  Final Clinical Impressions(s) / UC Diagnoses   Final diagnoses:  Diarrhea, unspecified type  Potential for food poisoning  Acute cystitis without hematuria     Discharge  Instructions      Instructed patient to take medication as directed with food to completion.  Advised patient to adhere to a bland/brat (banana, rice/rice pudding, applesauce, toast) diet for the next 2 to 3 days gradually returning to normal diet.  Encouraged increase daily water intake to 64 ounces per day may include Gatorade zero for  cardiac electrolyte replacement.  Advised we will follow-up with urine culture results once received.  Advised if symptoms worsen and/or unresolved please follow-up PCP or here for further evaluation.     ED Prescriptions     Medication Sig Dispense Auth. Provider   sulfamethoxazole-trimethoprim (BACTRIM DS) 800-160 MG tablet Take 1 tablet by mouth 2 (two) times daily for 3 days. 6 tablet Trevor Iha, FNP      PDMP not reviewed this encounter.   Trevor Iha, FNP 11/13/22 1038

## 2022-11-13 NOTE — ED Triage Notes (Signed)
States her stomach was a little off yesterday but after eating lunch (fruit cup and leftover salmon) she had 7 episodes diarrhea. Taking imodium and drinking gatorade. Today has had 6 episodes "foul watery stool". Last was 2 hours pta.

## 2022-11-14 LAB — URINE CULTURE: Culture: NO GROWTH

## 2022-11-19 NOTE — Progress Notes (Signed)
[name] [address] [place] [address] Phone: [phone] Subjective:   INadine [name], am serving as a scribe for Dr. Antoine Primas.  I'm seeing this patient by the request  of:  Pincus Sanes, MD  CC: back and neck pain follow up   [id number]  [name] is a 47 y.o. female coming in with complaint of back and neck pain. OMT 11/11/2022. Patient states still doesn't feel like she's back to baseline after having toradol. Here for manipulation.  Medications patient has been prescribed:   Taking:         Reviewed prior external information including notes and imaging from previsou exam, outside providers and external EMR if available.   As well as notes that were available from care everywhere and other healthcare systems.  Past medical history, social, surgical and family history all reviewed in electronic medical record.  No pertanent information unless stated regarding to the chief complaint.   Past Medical History:  Diagnosis Date   Allergy    Autoimmune disorder (HCC)    Crohn's disease (HCC)    Endometriosis    Hypertension    Kidney, medullary sponge    Ovarian cyst    Pre-eclampsia    Raynaud's disease    Stress incontinence in female     Allergies  Allergen Reactions   Lamisil [Terbinafine] Hives and Itching    In pill form     Review of Systems:  No headache, visual changes, nausea, vomiting, diarrhea, constipation, dizziness, abdominal pain, skin rash, fevers, chills, night sweats, weight loss, swollen lymph nodes, body aches, joint swelling, chest pain, shortness of breath, mood changes. POSITIVE muscle aches  Objective  Blood pressure 122/84, pulse 82, height 5\' 3"  (1.6 m), weight 149 lb (67.6 kg), SpO2 97%.   General: No apparent distress alert and oriented x3 mood and affect normal, dressed appropriately.  HEENT: Pupils equal, extraocular movements intact  Respiratory: Patient's speak in  full sentences and does not appear short of breath  Cardiovascular: No lower extremity edema, non tender, no erythema  MSK:  Back does have some loss of lordosis noted.  Tightness noted in the paraspinal musculature of the lumbar spine right greater than left.  Does have some gluteal tenderness also noted on the right greater than left.  Osteopathic findings  C2 flexed rotated and side bent right C7 flexed rotated and side bent left T3 extended rotated and side bent right inhaled rib T7 extended rotated and side bent left L2 flexed rotated and side bent right Sacrum right on right Pelvic shear noted    Assessment and Plan:  Lumbar trigger point syndrome Patient has had difficulty with this again.  Seems to be more of this in the myofascial.  Patient though polyarthralgia had been better she states overall.  Continues to have discomfort in that lower back when she does have the aggravating factors.  Responded extremely well though to osteopathic manipulation today.  Discussed which activities to do and which ones to avoid.  Increase again in 6 to 8 weeks    Nonallopathic problems  Decision today to treat with OMT was based on Physical Exam  After verbal consent patient was treated with HVLA, ME, FPR techniques in cervical, rib, thoracic, lumbar, and sacral  areas  Patient tolerated the procedure well with improvement in symptoms  Patient given exercises, stretches and lifestyle modifications  See medications in patient instructions if given  Patient will follow up in  4-8 weeks    The above documentation has been reviewed and is accurate and complete Judi Saa, DO          Note: This dictation was prepared with Dragon dictation along with smaller phrase technology. Any transcriptional errors that result from this process are unintentional.

## 2022-11-23 ENCOUNTER — Encounter: Payer: Self-pay | Admitting: Family Medicine

## 2022-11-23 ENCOUNTER — Ambulatory Visit: Payer: Commercial Managed Care - PPO | Admitting: Family Medicine

## 2022-11-23 VITALS — BP 122/84 | HR 82 | Ht 63.0 in | Wt 149.0 lb

## 2022-11-23 DIAGNOSIS — M9903 Segmental and somatic dysfunction of lumbar region: Secondary | ICD-10-CM | POA: Diagnosis not present

## 2022-11-23 DIAGNOSIS — M9904 Segmental and somatic dysfunction of sacral region: Secondary | ICD-10-CM | POA: Diagnosis not present

## 2022-11-23 DIAGNOSIS — M9902 Segmental and somatic dysfunction of thoracic region: Secondary | ICD-10-CM | POA: Diagnosis not present

## 2022-11-23 DIAGNOSIS — M5459 Other low back pain: Secondary | ICD-10-CM | POA: Diagnosis not present

## 2022-11-23 DIAGNOSIS — M9901 Segmental and somatic dysfunction of cervical region: Secondary | ICD-10-CM | POA: Diagnosis not present

## 2022-11-23 DIAGNOSIS — M9908 Segmental and somatic dysfunction of rib cage: Secondary | ICD-10-CM

## 2022-11-23 NOTE — Patient Instructions (Signed)
Good to see you! Glad the families okay See you again in 5 weeks

## 2022-11-23 NOTE — Assessment & Plan Note (Signed)
Patient has had difficulty with this again.  Seems to be more of this in the myofascial.  Patient though polyarthralgia had been better she states overall.  Continues to have discomfort in that lower back when she does have the aggravating factors.  Responded extremely well though to osteopathic manipulation today.  Discussed which activities to do and which ones to avoid.  Increase again in 6 to 8 weeks

## 2022-11-27 ENCOUNTER — Ambulatory Visit: Payer: Commercial Managed Care - PPO | Admitting: Family Medicine

## 2022-11-27 DIAGNOSIS — F341 Dysthymic disorder: Secondary | ICD-10-CM | POA: Diagnosis not present

## 2022-11-27 DIAGNOSIS — F419 Anxiety disorder, unspecified: Secondary | ICD-10-CM | POA: Diagnosis not present

## 2022-11-30 ENCOUNTER — Encounter: Payer: Self-pay | Admitting: Family Medicine

## 2022-11-30 ENCOUNTER — Other Ambulatory Visit: Payer: Self-pay | Admitting: Family Medicine

## 2022-11-30 ENCOUNTER — Other Ambulatory Visit (HOSPITAL_COMMUNITY): Payer: Self-pay

## 2022-12-01 ENCOUNTER — Other Ambulatory Visit: Payer: Self-pay | Admitting: Physical Medicine and Rehabilitation

## 2022-12-01 ENCOUNTER — Other Ambulatory Visit (HOSPITAL_COMMUNITY): Payer: Self-pay

## 2022-12-01 MED ORDER — LEVOTHYROXINE SODIUM 75 MCG PO TABS
75.0000 ug | ORAL_TABLET | Freq: Every day | ORAL | 0 refills | Status: DC
  Filled 2022-12-01: qty 90, 90d supply, fill #0

## 2022-12-02 ENCOUNTER — Other Ambulatory Visit (HOSPITAL_COMMUNITY): Payer: Self-pay

## 2022-12-02 MED ORDER — PREGABALIN 100 MG PO CAPS
100.0000 mg | ORAL_CAPSULE | Freq: Two times a day (BID) | ORAL | 1 refills | Status: DC
Start: 2022-12-02 — End: 2022-12-28
  Filled 2022-12-02: qty 60, 30d supply, fill #0

## 2022-12-03 ENCOUNTER — Ambulatory Visit: Payer: Commercial Managed Care - PPO

## 2022-12-03 NOTE — Progress Notes (Signed)
Orthotic eval   Patient was seen, measured for custom molded foot orthotics  Patient will benefit from CFO's as they will help provide total contact to MLA's helping to better distribute body weight across BIL feet greater reducing plantar pressure and pain and to also encourage FF and RF alignment  Patient was scanned items to be ordered and fit when in  [employer], CFo, CFm

## 2022-12-04 DIAGNOSIS — Z79890 Hormone replacement therapy (postmenopausal): Secondary | ICD-10-CM | POA: Diagnosis not present

## 2022-12-09 DIAGNOSIS — M25512 Pain in left shoulder: Secondary | ICD-10-CM | POA: Diagnosis not present

## 2022-12-09 DIAGNOSIS — M797 Fibromyalgia: Secondary | ICD-10-CM | POA: Diagnosis not present

## 2022-12-09 DIAGNOSIS — M545 Low back pain, unspecified: Secondary | ICD-10-CM | POA: Diagnosis not present

## 2022-12-17 DIAGNOSIS — H16223 Keratoconjunctivitis sicca, not specified as Sjogren's, bilateral: Secondary | ICD-10-CM | POA: Diagnosis not present

## 2022-12-17 DIAGNOSIS — H0288B Meibomian gland dysfunction left eye, upper and lower eyelids: Secondary | ICD-10-CM | POA: Diagnosis not present

## 2022-12-17 DIAGNOSIS — H0288A Meibomian gland dysfunction right eye, upper and lower eyelids: Secondary | ICD-10-CM | POA: Diagnosis not present

## 2022-12-28 ENCOUNTER — Encounter
Payer: Commercial Managed Care - PPO | Attending: Physical Medicine and Rehabilitation | Admitting: Physical Medicine and Rehabilitation

## 2022-12-28 ENCOUNTER — Other Ambulatory Visit (HOSPITAL_COMMUNITY): Payer: Self-pay

## 2022-12-28 ENCOUNTER — Encounter: Payer: Self-pay | Admitting: Physical Medicine and Rehabilitation

## 2022-12-28 ENCOUNTER — Other Ambulatory Visit: Payer: Self-pay | Admitting: Physical Medicine and Rehabilitation

## 2022-12-28 VITALS — BP 129/89 | HR 94 | Ht 63.0 in | Wt 151.4 lb

## 2022-12-28 DIAGNOSIS — M5416 Radiculopathy, lumbar region: Secondary | ICD-10-CM | POA: Diagnosis not present

## 2022-12-28 DIAGNOSIS — M7918 Myalgia, other site: Secondary | ICD-10-CM | POA: Diagnosis not present

## 2022-12-28 DIAGNOSIS — G5702 Lesion of sciatic nerve, left lower limb: Secondary | ICD-10-CM | POA: Diagnosis not present

## 2022-12-28 DIAGNOSIS — G894 Chronic pain syndrome: Secondary | ICD-10-CM

## 2022-12-28 MED ORDER — PREGABALIN 100 MG PO CAPS
100.0000 mg | ORAL_CAPSULE | Freq: Two times a day (BID) | ORAL | 1 refills | Status: DC
Start: 2022-12-28 — End: 2022-12-28
  Filled 2022-12-28: qty 180, 90d supply, fill #0
  Filled ????-??-??: fill #0

## 2022-12-28 MED ORDER — PREGABALIN 150 MG PO CAPS
150.0000 mg | ORAL_CAPSULE | Freq: Two times a day (BID) | ORAL | 5 refills | Status: DC
  Filled 2022-12-28: qty 60, 30d supply, fill #0
  Filled 2023-01-27: qty 60, 30d supply, fill #1
  Filled 2023-03-24: qty 60, 30d supply, fill #2
  Filled 2023-05-26: qty 60, 30d supply, fill #3
  Filled 2023-07-01: qty 60, 30d supply, fill #4
  Filled 2023-08-16: qty 60, 30d supply, fill #5

## 2022-12-28 MED ORDER — GABAPENTIN 100 MG PO CAPS
200.0000 mg | ORAL_CAPSULE | Freq: Two times a day (BID) | ORAL | 1 refills | Status: DC
  Filled 2022-12-28 (×2): qty 360, 90d supply, fill #0
  Filled 2023-05-06: qty 360, 90d supply, fill #1

## 2022-12-28 NOTE — Patient Instructions (Addendum)
Pt is a 47 yr old female with dx of Fibromyalgia, HTN, and hypothyroidism- last TSH 1.07;  Has medullar sponge kidney.  Here for evaluation of fibromyalgia- got dx 10/21- but Sx's since 2017. Also has myofascial pain syndrome- Here for f/u on Fibromyalgia and to get Trigger point injections.   Dr Katrinka Blazing- DO/OMT doesn't think pt has Fibromyalgia per his note.  Suggest using tramadol- during day if needed for severe pain.   2. Can try to increase Lyrica to 150 mg 2x/day for nerve/myofascial pain. With Gabapentin  3. Agree with her working on stress/anxiety- with her current regimen.   4. Will change gabapentin to 200 mg 2x/day- to take WITH Lyrica 150 mg BID   5. Tramadol per Dr Lawerance Bach- sees her q6 months- and I agree with plan - written for 30 pills- which will last her 3 months or so.    6.  F/u in 3-4 months- f/u on chronic pain. chance might not need to take Gabapentin as much with increase in Lyrica.

## 2022-12-28 NOTE — Progress Notes (Signed)
Subjective:    Patient ID: [name], female    DOB: 04/20/1975, 47 y.o.   MRN: [id number]  HPI  Pt is a 47 yr old female with dx of Fibromyalgia, HTN, and hypothyroidism- last TSH 1.07;  Has medullar sponge kidney.  Here for evaluation of fibromyalgia- got dx 10/21- but Sx's since 2017. Also has myofascial pain syndrome- Here for f/u on Fibromyalgia and to get Trigger point injections.   Dr Katrinka Blazing- DO/OMT doesn't think pt has Fibromyalgia per his note.   Had to reschedule- and then supposedly I was sick- so haven't seen her since February 2024.    Had a really long flare- months long- felt like crap for"ever"-  Pain everywhere- fatigue- and perceived muscle weakness.  Eased off after a few months- finally took til September to feel somewhat better.    Knows what flares her up- stress, no sleeping well- not doing PT exercises, theracane all help her feel better, but wasn't having time to do anything.   Had a few vacations this summer- sleeping as long as wanted to -was helpful.   Seeing Dr Katrinka Blazing- and Dr Lawerance Bach- q6 months- she writes for Tramadol- uses sometimes.  Taking ibuprofen- but was bruising badly.  Was still having pain, and a couple of flares with back-  Had to do Prednisone and gets a Toradol and IM steroid injection from Dr Katrinka Blazing every so often.   Wrote for gabapentin 200 mg at bedtime at night-  Cannot take tramadol at work- but doesn't feel like functions less well- can focus more- thinking clarity is actually better.  Gabapentin- don't take much now- 100 mg- 1-2x/day when hurting- and 200 mg at bedtime.   Hard to think about what's hurting- because it affects how feels and not wanting to get OOB.   Takes Zanaflex sometimes if SI joint really hurting- not as much as flexeril- gives AM sluggish feeling, esp if takes gabapentin.   Got fill of Gabapentin for 90 days. But will run out early.     Pain Inventory Average Pain 3 Pain Right Now 4 My pain is  constant and aching  In the last 24 hours, has pain interfered with the following? General activity 3 Relation with others 4 Enjoyment of life 7 What TIME of day is your pain at its worst? evening Sleep (in general) Poor  Pain is worse with: sitting and standing Pain improves with: rest, therapy/exercise, pacing activities, and medication Relief from Meds: 7  Family History  Problem Relation Age of Onset   Breast cancer Mother    Hypertension Father    Hyperlipidemia Father    Other Brother        lyme-chronic   Stroke Maternal Grandmother    Alcohol abuse Paternal Grandmother    Heart disease Paternal Grandfather    Colon cancer Neg Hx    Esophageal cancer Neg Hx    Rectal cancer Neg Hx    Social History   Socioeconomic History   Marital status: Married    Spouse name: Not on file   Number of children: 3   Years of education: Not on file   Highest education level: Not on file  Occupational History   Occupation: Teacher, adult education: [employer]  Tobacco Use   Smoking status: Never   Smokeless tobacco: Never  Vaping Use   Vaping status: Never Used  Substance and Sexual Activity   Alcohol use: Yes   Drug use: No   Sexual  activity: Yes  Other Topics Concern   Not on file  Social History Narrative   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, OR   Social Determinants of Health   Financial Resource Strain: Not on file  Food Insecurity: Not on file  Transportation Needs: Not on file  Physical Activity: Not on file  Stress: Not on file  Social Connections: Unknown (07/19/2021)   Received from [employer], [facility]   Social Network    Social Network: Not on file   Past Surgical History:  Procedure Laterality Date   CHOLECYSTECTOMY     DILATION AND CURETTAGE OF UTERUS     ENDOMETRIAL ABLATION     NOVASURE ABLATION     TUBAL LIGATION     Past Surgical History:  Procedure Laterality Date   CHOLECYSTECTOMY     DILATION AND CURETTAGE OF UTERUS      ENDOMETRIAL ABLATION     NOVASURE ABLATION     TUBAL LIGATION     Past Medical History:  Diagnosis Date   Allergy    Autoimmune disorder (HCC)    Crohn's disease (HCC)    Endometriosis    Hypertension    Kidney, medullary sponge    Ovarian cyst    Pre-eclampsia    Raynaud's disease    Stress incontinence in female    BP 129/89   Pulse 94   Ht 5\' 3"  (1.6 m)   Wt 151 lb 6.4 oz (68.7 kg)   SpO2 97%   BMI 26.82 kg/m   Opioid Risk Score:   Fall Risk Score:  `1  Depression screen [facility] 2/9     12/28/2022    3:34 PM 10/27/2022    3:41 PM 06/19/2022    1:04 PM 11/21/2021    2:35 PM 09/19/2021    2:05 PM 06/02/2021   10:58 AM 04/25/2021    1:53 PM  Depression screen PHQ 2/9  Decreased Interest 1 0 0 0 0 0 0  Down, Depressed, Hopeless 1 0 0 0 0 1 0  PHQ - 2 Score 2 0 0 0 0 1 0  Altered sleeping      2   Tired, decreased energy      2   Change in appetite      0   Feeling bad or failure about yourself       0   Trouble concentrating      0   Moving slowly or fidgety/restless      0   Suicidal thoughts      0   PHQ-9 Score      5   Difficult doing work/chores      Somewhat difficult      Review of Systems  Constitutional: Negative.   HENT: Negative.    Eyes: Negative.   Respiratory: Negative.    Cardiovascular: Negative.   Gastrointestinal: Negative.   Endocrine: Negative.   Genitourinary: Negative.   Musculoskeletal:  Positive for back pain and myalgias.  Skin: Negative.   Allergic/Immunologic: Negative.   Neurological: Negative.   Hematological: Negative.   Psychiatric/Behavioral:  Positive for dysphoric mood.   All other systems reviewed and are negative.      Objective:   Physical Exam  Awake, alert, appears aching,/in pain due to complete stillness- trying to avoid moving, NAD       Assessment & Plan:   Pt is a 47 yr old female with dx of Fibromyalgia, HTN, and hypothyroidism- last TSH 1.07;  Has medullar sponge kidney.  Here for evaluation of  fibromyalgia- got dx 10/21- but Sx's since 2017. Also has myofascial pain syndrome- Here for f/u on Fibromyalgia and to get Trigger point injections.   Dr Katrinka Blazing- DO/OMT doesn't think pt has Fibromyalgia per his note.  Suggest using tramadol- during day if needed for severe pain.   2. Can try to increase Lyrica to 150 mg 2x/day for nerve/myofascial pain. With Gabapentin  3. Agree with her working on stress/anxiety- with her current regimen.   4. Will change gabapentin to 200 mg 2x/day- to take WITH Lyrica 150 mg BID   5. Tramadol per Dr Lawerance Bach- sees her q6 months- and I agree with plan - written for 30 pills- which will last her 3 months or so.    6.  F/u in 3-4 months- f/u on chronic pain.    I spent a total of 24    minutes on total care today- >50% coordination of care- due to d/w pt about options for pain control- chance might not need to take Gabapentin as much with increase in Lyrica. Also took over gabapentin and increased lyrica- and d/w pt how to take other meds

## 2023-01-01 DIAGNOSIS — F341 Dysthymic disorder: Secondary | ICD-10-CM | POA: Diagnosis not present

## 2023-01-01 DIAGNOSIS — F419 Anxiety disorder, unspecified: Secondary | ICD-10-CM | POA: Diagnosis not present

## 2023-01-03 ENCOUNTER — Other Ambulatory Visit: Payer: Self-pay | Admitting: Family Medicine

## 2023-01-04 ENCOUNTER — Other Ambulatory Visit (HOSPITAL_COMMUNITY): Payer: Self-pay

## 2023-01-04 MED ORDER — VILAZODONE HCL 20 MG PO TABS
1.0000 | ORAL_TABLET | Freq: Every day | ORAL | 0 refills | Status: DC
  Filled 2023-01-04: qty 90, 90d supply, fill #0

## 2023-01-06 DIAGNOSIS — M545 Low back pain, unspecified: Secondary | ICD-10-CM | POA: Diagnosis not present

## 2023-01-06 DIAGNOSIS — M797 Fibromyalgia: Secondary | ICD-10-CM | POA: Diagnosis not present

## 2023-01-06 DIAGNOSIS — M25512 Pain in left shoulder: Secondary | ICD-10-CM | POA: Diagnosis not present

## 2023-01-07 NOTE — Progress Notes (Signed)
[name] [address] [place] [address] Phone: [phone] Subjective:   INadine Counts, am serving as a scribe for Dr. Antoine Primas.  I'm seeing this patient by the request  of:  Pincus Sanes, MD  CC: back and neck pain   [id number]  [name] is a 47 y.o. female coming in with complaint of back and neck pain. OMT 11/23/2022. Patient states doing well. Something fell on left foot. Just take a gander. No other concerns.  Medications patient has been prescribed: Vybryd, Synthroid, Zanaflex  Taking:         Reviewed prior external information including notes and imaging from previsou exam, outside providers and external EMR if available.   As well as notes that were available from care everywhere and other healthcare systems.  Past medical history, social, surgical and family history all reviewed in electronic medical record.  No pertanent information unless stated regarding to the chief complaint.   Past Medical History:  Diagnosis Date   Allergy    Autoimmune disorder (HCC)    Crohn's disease (HCC)    Endometriosis    Hypertension    Kidney, medullary sponge    Ovarian cyst    Pre-eclampsia    Raynaud's disease    Stress incontinence in female     Allergies  Allergen Reactions   Lamisil [Terbinafine] Hives and Itching    In pill form     Review of Systems:  No headache, visual changes, nausea, vomiting, diarrhea, constipation, dizziness, abdominal pain, skin rash, fevers, chills, night sweats, weight loss, swollen lymph nodes, body aches, joint swelling, chest pain, shortness of breath, mood changes. POSITIVE muscle aches  Objective  Pulse 83, height 5\' 3"  (1.6 m), weight 151 lb (68.5 kg), SpO2 98%.   General: No apparent distress alert and oriented x3 mood and affect normal, dressed appropriately.  HEENT: Pupils equal, extraocular movements intact  Respiratory: Patient's speak in full sentences and  does not appear short of breath  Cardiovascular: No lower extremity edema, non tender, no erythema  MSK:  Back does have some loss of lordosis noted.  Some tenderness to palpation in the paraspinal musculature.  Tightness noted with Pearlean Brownie right greater than left  Osteopathic findings  C2 flexed rotated and side bent right T3 extended rotated and side bent right inhaled rib T9 extended rotated and side bent left L2 flexed rotated and side bent right L3 flexed rotated and side bent Sacrum right on right       Assessment and Plan:  SI (sacroiliac) joint dysfunction Continue to have some difficulty on the right side of the back.  Discussed icing regimen and home exercises, discussed which activities to do and which ones to avoid, increase activity slowly over the course of next several weeks.  Follow-up again in 6 to 8 weeks    Nonallopathic problems  Decision today to treat with OMT was based on Physical Exam  After verbal consent patient was treated with HVLA, ME, FPR techniques in cervical, rib, thoracic, lumbar, and sacral  areas  Patient tolerated the procedure well with improvement in symptoms  Patient given exercises, stretches and lifestyle modifications  See medications in patient instructions if given  Patient will follow up in 4-8 weeks    The above documentation has been reviewed and is accurate and complete Judi Saa, DO          Note: This dictation was prepared with Dragon dictation along with  smaller phrase technology. Any transcriptional errors that result from this process are unintentional.

## 2023-01-08 ENCOUNTER — Ambulatory Visit: Payer: Commercial Managed Care - PPO | Admitting: Family Medicine

## 2023-01-11 ENCOUNTER — Ambulatory Visit (INDEPENDENT_AMBULATORY_CARE_PROVIDER_SITE_OTHER): Payer: Commercial Managed Care - PPO

## 2023-01-11 DIAGNOSIS — M2141 Flat foot [pes planus] (acquired), right foot: Secondary | ICD-10-CM

## 2023-01-11 DIAGNOSIS — M2142 Flat foot [pes planus] (acquired), left foot: Secondary | ICD-10-CM | POA: Diagnosis not present

## 2023-01-12 ENCOUNTER — Encounter: Payer: Self-pay | Admitting: Family Medicine

## 2023-01-12 ENCOUNTER — Ambulatory Visit: Payer: Commercial Managed Care - PPO | Admitting: Family Medicine

## 2023-01-12 VITALS — HR 83 | Ht 63.0 in | Wt 151.0 lb

## 2023-01-12 DIAGNOSIS — M533 Sacrococcygeal disorders, not elsewhere classified: Secondary | ICD-10-CM

## 2023-01-12 DIAGNOSIS — M9903 Segmental and somatic dysfunction of lumbar region: Secondary | ICD-10-CM

## 2023-01-12 DIAGNOSIS — M9904 Segmental and somatic dysfunction of sacral region: Secondary | ICD-10-CM | POA: Diagnosis not present

## 2023-01-12 DIAGNOSIS — M9902 Segmental and somatic dysfunction of thoracic region: Secondary | ICD-10-CM | POA: Diagnosis not present

## 2023-01-12 DIAGNOSIS — M9908 Segmental and somatic dysfunction of rib cage: Secondary | ICD-10-CM

## 2023-01-12 DIAGNOSIS — M9901 Segmental and somatic dysfunction of cervical region: Secondary | ICD-10-CM | POA: Diagnosis not present

## 2023-01-12 NOTE — Patient Instructions (Signed)
Good to see you! Doing great Find time for yourself See you again in 6-8 weeks

## 2023-01-12 NOTE — Assessment & Plan Note (Signed)
Continue to have some difficulty on the right side of the back.  Discussed icing regimen and home exercises, discussed which activities to do and which ones to avoid, increase activity slowly over the course of next several weeks.  Follow-up again in 6 to 8 weeks

## 2023-01-12 NOTE — Progress Notes (Signed)
Patient presents today to pick up custom molded foot orthotics, diagnosed with Pes Planus by Dr. Ralene Cork.   Orthotics were dispensed and fit was satisfactory. Reviewed instructions for break-in and wear. Written instructions given to patient.  Patient will follow up as needed.   [name] Cped, CFo, CFm

## 2023-01-20 DIAGNOSIS — M25512 Pain in left shoulder: Secondary | ICD-10-CM | POA: Diagnosis not present

## 2023-01-20 DIAGNOSIS — M797 Fibromyalgia: Secondary | ICD-10-CM | POA: Diagnosis not present

## 2023-01-20 DIAGNOSIS — M545 Low back pain, unspecified: Secondary | ICD-10-CM | POA: Diagnosis not present

## 2023-01-21 ENCOUNTER — Encounter: Payer: Self-pay | Admitting: Internal Medicine

## 2023-01-21 NOTE — Patient Instructions (Addendum)
       Blood work was ordered.   The lab is on the first floor.    Medications changes include :   none    A mammogram and ultrasound was ordered ordered and someone will call you to schedule an appointment.     Return in about 6 months (around 07/22/2023) for Physical Exam.

## 2023-01-21 NOTE — Progress Notes (Signed)
Subjective:    Patient ID: [name], female    DOB: 1975/07/05, 47 y.o.   MRN: [id number]     HPI Nicloe is here for follow up of her chronic medical problems.  More swelling in legs in evening.  Wears compression socks some days.  She may be sitting more.   Recent fibro flare  - started feeling better end of Sept. Rarely takes ibuprofen,   takes tramadol occasionally.   LN in right axilla -last mammogram was just over a year ago.  She feels a couple very small bumps in the right axilla.  Denies any lumps or bumps in either breast.  Doing PT exercises.  No cardio.     Medications and allergies reviewed with patient and updated if appropriate.  Current Outpatient Medications on File Prior to Visit  Medication Sig Dispense Refill   Acetaminophen-Caffeine (EXCEDRIN TENSION HEADACHE) 500-65 MG TABS Take 2 tablets by mouth daily as needed (headache).     amLODipine (NORVASC) 5 MG tablet Take 1 tablet (5 mg total) by mouth daily. 90 tablet 1   Cholecalciferol (VITAMIN D3) 2000 units TABS Take 2,000 Units by mouth daily.     docusate sodium (COLACE) 100 MG capsule Take 100 mg by mouth daily.     Estradiol-Progesterone 1-100 MG CAPS Take 1 capsule by mouth daily at 2 am.     fluticasone (FLONASE) 50 MCG/ACT nasal spray Place 2 sprays into both nostrils daily. 16 g 6   gabapentin (NEURONTIN) 100 MG capsule Take 2 capsules (200 mg total) by mouth 2 (two) times daily. To take with Lyrica. 360 capsule 1   levothyroxine (SYNTHROID) 75 MCG tablet Take 1 tablet (75 mcg total) by mouth daily before breakfast. 90 tablet 0   montelukast (SINGULAIR) 10 MG tablet Take 1 tablet (10 mg total) by mouth at bedtime. 90 tablet 1   multivitamin-lutein (OCUVITE-LUTEIN) CAPS capsule Take 1 capsule by mouth daily.     nitrofurantoin (MACRODANTIN) 50 MG capsule Take as directed after intercourse 45 capsule 0   omeprazole (PRILOSEC) 20 MG capsule Take 20 mg by mouth daily.     Prasterone, DHEA,  (DHEA ADVANCED FORMULA PO) Take 1 tablet by mouth daily.      pregabalin (LYRICA) 150 MG capsule Take 1 capsule (150 mg total) by mouth 2 (two) times daily. 60 capsule 5   Propylene Glycol (SYSTANE COMPLETE OP) Place 1 drop into both eyes daily.     tiZANidine (ZANAFLEX) 4 MG tablet Take 1 tablet (4 mg total) by mouth at bedtime. 30 tablet 0   traMADol (ULTRAM) 50 MG tablet Take 1 tablet (50 mg total) by mouth every 12 (twelve) hours as needed for moderate pain 30 tablet 0   Vilazodone HCl (VIIBRYD) 20 MG TABS Take 1 tablet (20 mg total) by mouth daily. 90 tablet 0   No current facility-administered medications on file prior to visit.     Review of Systems  Constitutional:  Negative for fever.  Respiratory:  Negative for cough, shortness of breath and wheezing.   Cardiovascular:  Positive for palpitations (occ) and leg swelling. Negative for chest pain.  Musculoskeletal:  Positive for arthralgias and back pain.  Neurological:  Positive for headaches (occ). Negative for light-headedness.       Objective:   Vitals:   01/22/23 1425  BP: 120/84  Pulse: 75  Temp: 97.8 F (36.6 C)  SpO2: 95%   BP Readings from Last 3 Encounters:  01/22/23 120/84  12/28/22 129/89  11/23/22 122/84   Wt Readings from Last 3 Encounters:  01/22/23 153 lb (69.4 kg)  01/12/23 151 lb (68.5 kg)  12/28/22 151 lb 6.4 oz (68.7 kg)   Body mass index is 27.1 kg/m.    Physical Exam Constitutional:      General: She is not in acute distress.    Appearance: Normal appearance.  HENT:     Head: Normocephalic and atraumatic.  Eyes:     Conjunctiva/sclera: Conjunctivae normal.  Cardiovascular:     Rate and Rhythm: Normal rate and regular rhythm.     Heart sounds: Normal heart sounds.  Pulmonary:     Effort: Pulmonary effort is normal. No respiratory distress.     Breath sounds: Normal breath sounds. No wheezing.  Musculoskeletal:     Cervical back: Neck supple.     Right lower leg: No edema.      Left lower leg: No edema.  Lymphadenopathy:     Cervical: No cervical adenopathy.  Skin:    General: Skin is warm and dry.     Findings: No rash.  Neurological:     Mental Status: She is alert. Mental status is at baseline.  Psychiatric:        Mood and Affect: Mood normal.        Behavior: Behavior normal.        Lab Results  Component Value Date   WBC 11.5 (H) 06/19/2022   HGB 13.6 06/19/2022   HCT 41.3 06/19/2022   PLT 322.0 06/19/2022   GLUCOSE 82 06/19/2022   CHOL 184 06/19/2022   TRIG 137.0 06/19/2022   HDL 56.70 06/19/2022   LDLCALC 100 (H) 06/19/2022   ALT 12 06/19/2022   AST 13 06/19/2022   NA 139 06/19/2022   K 3.8 06/19/2022   CL 101 06/19/2022   CREATININE 0.81 06/19/2022   BUN 13 06/19/2022   CO2 29 06/19/2022   TSH 2.47 06/19/2022   HGBA1C 5.7 06/19/2022     Assessment & Plan:    See Problem List for Assessment and Plan of chronic medical problems.

## 2023-01-22 ENCOUNTER — Other Ambulatory Visit (HOSPITAL_COMMUNITY): Payer: Self-pay

## 2023-01-22 ENCOUNTER — Ambulatory Visit: Payer: Commercial Managed Care - PPO | Admitting: Internal Medicine

## 2023-01-22 VITALS — BP 120/84 | HR 75 | Temp 97.8°F | Ht 63.0 in | Wt 153.0 lb

## 2023-01-22 DIAGNOSIS — N390 Urinary tract infection, site not specified: Secondary | ICD-10-CM

## 2023-01-22 DIAGNOSIS — R600 Localized edema: Secondary | ICD-10-CM | POA: Insufficient documentation

## 2023-01-22 DIAGNOSIS — E039 Hypothyroidism, unspecified: Secondary | ICD-10-CM | POA: Diagnosis not present

## 2023-01-22 DIAGNOSIS — R7303 Prediabetes: Secondary | ICD-10-CM | POA: Diagnosis not present

## 2023-01-22 DIAGNOSIS — I10 Essential (primary) hypertension: Secondary | ICD-10-CM | POA: Diagnosis not present

## 2023-01-22 DIAGNOSIS — M7918 Myalgia, other site: Secondary | ICD-10-CM

## 2023-01-22 DIAGNOSIS — K219 Gastro-esophageal reflux disease without esophagitis: Secondary | ICD-10-CM

## 2023-01-22 DIAGNOSIS — R2231 Localized swelling, mass and lump, right upper limb: Secondary | ICD-10-CM | POA: Insufficient documentation

## 2023-01-22 LAB — COMPREHENSIVE METABOLIC PANEL
ALT: 12 U/L (ref 0–35)
AST: 14 U/L (ref 0–37)
Albumin: 4.2 g/dL (ref 3.5–5.2)
Alkaline Phosphatase: 71 U/L (ref 39–117)
BUN: 18 mg/dL (ref 6–23)
CO2: 31 meq/L (ref 19–32)
Calcium: 9.6 mg/dL (ref 8.4–10.5)
Chloride: 102 meq/L (ref 96–112)
Creatinine, Ser: 1.05 mg/dL (ref 0.40–1.20)
GFR: 62.34 mL/min (ref >60.00)
Glucose, Bld: 96 mg/dL (ref 70–99)
Potassium: 3.6 meq/L (ref 3.5–5.1)
Sodium: 140 meq/L (ref 135–145)
Total Bilirubin: 0.2 mg/dL (ref 0.2–1.2)
Total Protein: 6.8 g/dL (ref 6.0–8.3)

## 2023-01-22 LAB — TSH: TSH: 1.38 u[IU]/mL (ref 0.35–5.50)

## 2023-01-22 LAB — HEMOGLOBIN A1C: Hgb A1c MFr Bld: 5.6 % (ref 4.6–6.5)

## 2023-01-22 MED ORDER — TRAMADOL HCL 50 MG PO TABS
50.0000 mg | ORAL_TABLET | Freq: Two times a day (BID) | ORAL | 0 refills | Status: DC | PRN
  Filled 2023-01-22: qty 30, 15d supply, fill #0

## 2023-01-22 NOTE — Assessment & Plan Note (Signed)
Chronic Has medullary sponge kidney disease Frequent UTIs Continue nitrofurantoin 50 mg after intercourse for prevention 

## 2023-01-22 NOTE — Assessment & Plan Note (Signed)
Chronic °Blood pressure well controlled °CMP °Continue amlodipine 5 mg daily °

## 2023-01-22 NOTE — Assessment & Plan Note (Signed)
Chronic GERD controlled Continue omeprazole 20 mg daily  

## 2023-01-22 NOTE — Assessment & Plan Note (Addendum)
Chronic Myofascial pain and fibromyalgia Trigger point injections have not helped With current flare Following with Dr Katrinka Blazing and Dr lovorn Taking lyrica, tizanidine 4 mg at bed and tramadol as needed which is not often Taking gabapentin as needed taking this more Continue tramadol 50 mg daily prn

## 2023-01-22 NOTE — Assessment & Plan Note (Signed)
Chronic  euthyroid Check tsh and will titrate med dose if needed Currently taking levothyroxine 50 mcg daily

## 2023-01-22 NOTE — Assessment & Plan Note (Signed)
Right axillary palpable lumps-?  Lymph nodes versus other Last mammogram 12/2021 Diagnostic mammogram, ultrasound of right breast and axilla ordered

## 2023-01-22 NOTE — Assessment & Plan Note (Signed)
New Mild At end of day May be sitting more On amlodipine and lyrica which may be contributing Wears compression socks some days - advised wearing them daily Will change amlodipine only if needed since BP is good

## 2023-01-22 NOTE — Assessment & Plan Note (Signed)
Chronic Lab Results  Component Value Date   HGBA1C 5.7 06/19/2022   Check a1c Low sugar / carb diet Stressed regular exercise

## 2023-01-25 ENCOUNTER — Ambulatory Visit
Admission: RE | Admit: 2023-01-25 | Discharge: 2023-01-25 | Disposition: A | Discharge status: Home / Self Care | Payer: Commercial Managed Care - PPO | Source: Ambulatory Visit | Attending: Internal Medicine | Admitting: Internal Medicine

## 2023-01-25 ENCOUNTER — Other Ambulatory Visit: Payer: Self-pay | Admitting: Internal Medicine

## 2023-01-25 DIAGNOSIS — N390 Urinary tract infection, site not specified: Secondary | ICD-10-CM

## 2023-01-25 DIAGNOSIS — R2231 Localized swelling, mass and lump, right upper limb: Secondary | ICD-10-CM | POA: Diagnosis not present

## 2023-01-25 DIAGNOSIS — N644 Mastodynia: Secondary | ICD-10-CM

## 2023-01-25 DIAGNOSIS — M7918 Myalgia, other site: Secondary | ICD-10-CM

## 2023-01-25 DIAGNOSIS — E039 Hypothyroidism, unspecified: Secondary | ICD-10-CM

## 2023-01-25 DIAGNOSIS — K219 Gastro-esophageal reflux disease without esophagitis: Secondary | ICD-10-CM

## 2023-01-25 DIAGNOSIS — R7303 Prediabetes: Secondary | ICD-10-CM

## 2023-01-25 DIAGNOSIS — I10 Essential (primary) hypertension: Secondary | ICD-10-CM

## 2023-01-25 DIAGNOSIS — R600 Localized edema: Secondary | ICD-10-CM

## 2023-01-27 ENCOUNTER — Other Ambulatory Visit (HOSPITAL_COMMUNITY): Payer: Self-pay

## 2023-01-28 ENCOUNTER — Ambulatory Visit: Payer: Commercial Managed Care - PPO | Admitting: Dietician

## 2023-02-01 DIAGNOSIS — M25512 Pain in left shoulder: Secondary | ICD-10-CM | POA: Diagnosis not present

## 2023-02-01 DIAGNOSIS — M797 Fibromyalgia: Secondary | ICD-10-CM | POA: Diagnosis not present

## 2023-02-01 DIAGNOSIS — M545 Low back pain, unspecified: Secondary | ICD-10-CM | POA: Diagnosis not present

## 2023-02-17 DIAGNOSIS — M25512 Pain in left shoulder: Secondary | ICD-10-CM | POA: Diagnosis not present

## 2023-02-17 DIAGNOSIS — M797 Fibromyalgia: Secondary | ICD-10-CM | POA: Diagnosis not present

## 2023-02-17 DIAGNOSIS — M545 Low back pain, unspecified: Secondary | ICD-10-CM | POA: Diagnosis not present

## 2023-02-19 ENCOUNTER — Other Ambulatory Visit (HOSPITAL_COMMUNITY): Payer: Self-pay

## 2023-02-19 DIAGNOSIS — Z803 Family history of malignant neoplasm of breast: Secondary | ICD-10-CM | POA: Diagnosis not present

## 2023-02-19 DIAGNOSIS — R6882 Decreased libido: Secondary | ICD-10-CM | POA: Diagnosis not present

## 2023-02-19 DIAGNOSIS — Z1331 Encounter for screening for depression: Secondary | ICD-10-CM | POA: Diagnosis not present

## 2023-02-19 DIAGNOSIS — Z01419 Encounter for gynecological examination (general) (routine) without abnormal findings: Secondary | ICD-10-CM | POA: Diagnosis not present

## 2023-02-19 DIAGNOSIS — Z79890 Hormone replacement therapy (postmenopausal): Secondary | ICD-10-CM | POA: Diagnosis not present

## 2023-02-19 DIAGNOSIS — Z124 Encounter for screening for malignant neoplasm of cervix: Secondary | ICD-10-CM | POA: Diagnosis not present

## 2023-02-19 DIAGNOSIS — Z113 Encounter for screening for infections with a predominantly sexual mode of transmission: Secondary | ICD-10-CM | POA: Diagnosis not present

## 2023-02-19 DIAGNOSIS — Z01411 Encounter for gynecological examination (general) (routine) with abnormal findings: Secondary | ICD-10-CM | POA: Diagnosis not present

## 2023-02-19 MED ORDER — COMBIPATCH 0.05-0.25 MG/DAY TD PTTW
1.0000 | MEDICATED_PATCH | TRANSDERMAL | 3 refills | Status: DC
  Filled 2023-02-19: qty 24, 84d supply, fill #0

## 2023-02-19 MED ORDER — CLIMARA PRO 0.045-0.015 MG/DAY TD PTWK
1.0000 | MEDICATED_PATCH | TRANSDERMAL | 0 refills | Status: DC
  Filled 2023-02-19: qty 12, 84d supply, fill #0

## 2023-02-22 ENCOUNTER — Other Ambulatory Visit (HOSPITAL_COMMUNITY): Payer: Self-pay

## 2023-02-23 ENCOUNTER — Other Ambulatory Visit (HOSPITAL_COMMUNITY): Payer: Self-pay

## 2023-02-24 DIAGNOSIS — M25512 Pain in left shoulder: Secondary | ICD-10-CM | POA: Diagnosis not present

## 2023-02-24 DIAGNOSIS — M797 Fibromyalgia: Secondary | ICD-10-CM | POA: Diagnosis not present

## 2023-02-24 DIAGNOSIS — M545 Low back pain, unspecified: Secondary | ICD-10-CM | POA: Diagnosis not present

## 2023-02-26 ENCOUNTER — Other Ambulatory Visit (HOSPITAL_COMMUNITY): Payer: Self-pay

## 2023-02-26 DIAGNOSIS — F341 Dysthymic disorder: Secondary | ICD-10-CM | POA: Diagnosis not present

## 2023-03-04 ENCOUNTER — Other Ambulatory Visit: Payer: Self-pay | Admitting: Family Medicine

## 2023-03-05 ENCOUNTER — Other Ambulatory Visit (HOSPITAL_COMMUNITY): Payer: Self-pay

## 2023-03-05 MED ORDER — LEVOTHYROXINE SODIUM 75 MCG PO TABS
75.0000 ug | ORAL_TABLET | Freq: Every day | ORAL | 0 refills | Status: DC
  Filled 2023-03-05: qty 90, 90d supply, fill #0

## 2023-03-05 NOTE — Progress Notes (Unsigned)
[name] [address] [place] [address] Phone: [phone] Subjective:   Bruce Donath, am serving as a scribe for Dr. Antoine Primas.  I'm seeing this patient by the request  of:  Pincus Sanes, MD  CC: Back and neck pain follow-up  [id number]  [name] is a 47 y.o. female coming in with complaint of back and neck pain. OMT 01/12/2023. Patient states that she has had pain off and on for the past few weeks. Pain in R SI and lumbar spine. Pain radiates to front of R hip.   Also c/o R knee pain. Pain feels deep at times. Pain over   Medications patient has been prescribed: Synthroid  Taking:      Patient did have a mammogram since we have seen patient likely has a sebaceous cyst in the right axilla   Reviewed prior external information including notes and imaging from previsou exam, outside providers and external EMR if available.   As well as notes that were available from care everywhere and other healthcare systems.  Past medical history, social, surgical and family history all reviewed in electronic medical record.  No pertanent information unless stated regarding to the chief complaint.   Past Medical History:  Diagnosis Date   Allergy    Autoimmune disorder (HCC)    Crohn's disease (HCC)    Endometriosis    Hypertension    Kidney, medullary sponge    Ovarian cyst    Pre-eclampsia    Raynaud's disease    Stress incontinence in female     Allergies  Allergen Reactions   Lamisil [Terbinafine] Hives and Itching    In pill form     Review of Systems:  No headache, visual changes, nausea, vomiting, diarrhea, constipation, dizziness, abdominal pain, skin rash, fevers, chills, night sweats, weight loss, swollen lymph nodes, body aches, joint swelling, chest pain, shortness of breath, mood changes. POSITIVE muscle aches  Objective  Blood pressure 116/80, pulse 77, height 5\' 3"  (1.6 m), weight 153 lb (69.4  kg), SpO2 98%.   General: No apparent distress alert and oriented x3 mood and affect normal, dressed appropriately.  HEENT: Pupils equal, extraocular movements intact  Respiratory: Patient's speak in full sentences and does not appear short of breath  Cardiovascular: No lower extremity edema, non tender, no erythema  Gait MSK:  Back does have some loss lordosis noted.  Some tenderness to palpation in the paraspinal musculature  Osteopathic findings  C2 flexed rotated and side bent right C6 flexed rotated and side bent left T3 extended rotated and side bent right inhaled rib T9 extended rotated and side bent left L2 flexed rotated and side bent right Sacrum right on right       Assessment and Plan:  Lumbar radiculopathy Worsening discomfort again.  I do think that this is secondary to some of the flare that she is having.  The patient does have some facet arthropathy that we will need to continue to monitor.  Discussed icing regimen and home exercises, no change in medications at this time.  Worsening pain can consider an exacerbation and then will consider injections.  Follow-up again in 6 to 8 weeks otherwise. Multiple underlying problems including the fibromyalgia and myofascial pain can be contributing as well  Nonallopathic problems  Decision today to treat with OMT was based on Physical Exam  After verbal consent patient was treated with HVLA, ME, FPR techniques in cervical, rib, thoracic, lumbar, and sacral  areas  Patient tolerated the procedure well with improvement in symptoms  Patient given exercises, stretches and lifestyle modifications  See medications in patient instructions if given  Patient will follow up in 4-8 weeks    The above documentation has been reviewed and is accurate and complete Judi Saa, DO          Note: This dictation was prepared with Dragon dictation along with smaller phrase technology. Any transcriptional errors that result  from this process are unintentional.

## 2023-03-08 ENCOUNTER — Other Ambulatory Visit (HOSPITAL_COMMUNITY): Payer: Self-pay

## 2023-03-08 ENCOUNTER — Encounter: Payer: Self-pay | Admitting: Family Medicine

## 2023-03-08 ENCOUNTER — Ambulatory Visit: Payer: Commercial Managed Care - PPO | Admitting: Family Medicine

## 2023-03-08 VITALS — BP 116/80 | HR 77 | Ht 63.0 in | Wt 153.0 lb

## 2023-03-08 DIAGNOSIS — M9902 Segmental and somatic dysfunction of thoracic region: Secondary | ICD-10-CM

## 2023-03-08 DIAGNOSIS — M5416 Radiculopathy, lumbar region: Secondary | ICD-10-CM

## 2023-03-08 DIAGNOSIS — M9908 Segmental and somatic dysfunction of rib cage: Secondary | ICD-10-CM

## 2023-03-08 DIAGNOSIS — M9904 Segmental and somatic dysfunction of sacral region: Secondary | ICD-10-CM

## 2023-03-08 DIAGNOSIS — M9903 Segmental and somatic dysfunction of lumbar region: Secondary | ICD-10-CM

## 2023-03-08 DIAGNOSIS — M9901 Segmental and somatic dysfunction of cervical region: Secondary | ICD-10-CM

## 2023-03-08 NOTE — Assessment & Plan Note (Signed)
Worsening discomfort again.  I do think that this is secondary to some of the flare that she is having.  The patient does have some facet arthropathy that we will need to continue to monitor.  Discussed icing regimen and home exercises, no change in medications at this time.  Worsening pain can consider an exacerbation and then will consider injections.  Follow-up again in 6 to 8 weeks otherwise.

## 2023-03-08 NOTE — Patient Instructions (Signed)
Popliteal exercises Happy New Year See me again in 5 weeks

## 2023-03-14 ENCOUNTER — Encounter: Payer: Self-pay | Admitting: Family Medicine

## 2023-03-15 ENCOUNTER — Ambulatory Visit (INDEPENDENT_AMBULATORY_CARE_PROVIDER_SITE_OTHER): Payer: Commercial Managed Care - PPO

## 2023-03-15 DIAGNOSIS — M9904 Segmental and somatic dysfunction of sacral region: Secondary | ICD-10-CM

## 2023-03-15 DIAGNOSIS — M9901 Segmental and somatic dysfunction of cervical region: Secondary | ICD-10-CM | POA: Diagnosis not present

## 2023-03-15 DIAGNOSIS — M9903 Segmental and somatic dysfunction of lumbar region: Secondary | ICD-10-CM

## 2023-03-15 DIAGNOSIS — M5416 Radiculopathy, lumbar region: Secondary | ICD-10-CM | POA: Diagnosis not present

## 2023-03-15 DIAGNOSIS — M9902 Segmental and somatic dysfunction of thoracic region: Secondary | ICD-10-CM | POA: Diagnosis not present

## 2023-03-15 MED ORDER — KETOROLAC TROMETHAMINE 60 MG/2ML IM SOLN
60.0000 mg | Freq: Once | INTRAMUSCULAR | Status: AC
  Administered 2023-03-15: 60 mg via INTRAMUSCULAR

## 2023-03-15 MED ORDER — METHYLPREDNISOLONE ACETATE 80 MG/ML IJ SUSP
80.0000 mg | Freq: Once | INTRAMUSCULAR | Status: AC
  Administered 2023-03-15: 80 mg via INTRAMUSCULAR

## 2023-03-15 NOTE — Progress Notes (Signed)
 Patient received a Toradol 60mg  and depo 80mg  injection per Dr. Katrinka Blazing. Patient tolerated injection well

## 2023-03-17 ENCOUNTER — Other Ambulatory Visit (HOSPITAL_COMMUNITY): Payer: Self-pay

## 2023-03-17 MED ORDER — PROGESTERONE MICRONIZED 100 MG PO CAPS
100.0000 mg | ORAL_CAPSULE | Freq: Every evening | ORAL | 0 refills | Status: DC
  Filled 2023-03-17 – 2023-04-09 (×2): qty 90, 90d supply, fill #0

## 2023-03-17 MED ORDER — DOTTI 0.05 MG/24HR TD PTTW
1.0000 | MEDICATED_PATCH | TRANSDERMAL | 0 refills | Status: DC
  Filled 2023-03-17 – 2023-04-09 (×2): qty 24, 84d supply, fill #0

## 2023-03-19 DIAGNOSIS — F419 Anxiety disorder, unspecified: Secondary | ICD-10-CM | POA: Diagnosis not present

## 2023-03-19 DIAGNOSIS — F341 Dysthymic disorder: Secondary | ICD-10-CM | POA: Diagnosis not present

## 2023-03-26 ENCOUNTER — Telehealth: Payer: Commercial Managed Care - PPO | Admitting: Family Medicine

## 2023-03-26 DIAGNOSIS — U071 COVID-19: Secondary | ICD-10-CM

## 2023-03-26 MED ORDER — PROMETHAZINE-DM 6.25-15 MG/5ML PO SYRP: 5.0000 mL | ORAL_SOLUTION | Freq: Four times a day (QID) | ORAL | 0 refills | Status: DC | PRN

## 2023-03-26 NOTE — Progress Notes (Signed)
Virtual Visit Consent   [name], you are scheduled for a virtual visit with a [facility] provider today. Just as with appointments in the office, your consent must be obtained to participate. Your consent will be active for this visit and any virtual visit you may have with one of our providers in the next 365 days. If you have a MyChart account, a copy of this consent can be sent to you electronically.  As this is a virtual visit, video technology does not allow for your provider to perform a traditional examination. This may limit your provider's ability to fully assess your condition. If your provider identifies any concerns that need to be evaluated in person or the need to arrange testing (such as labs, EKG, etc.), we will make arrangements to do so. Although advances in technology are sophisticated, we cannot ensure that it will always work on either your end or our end. If the connection with a video visit is poor, the visit may have to be switched to a telephone visit. With either a video or telephone visit, we are not always able to ensure that we have a secure connection.  By engaging in this virtual visit, you consent to the provision of healthcare and authorize for your insurance to be billed (if applicable) for the services provided during this visit. Depending on your insurance coverage, you may receive a charge related to this service.  I need to obtain your verbal consent now. Are you willing to proceed with your visit today? Cossette Gatson has provided verbal consent on 03/26/2023 for a virtual visit (video or telephone). Freddy Finner, NP  Date: 03/26/2023 12:05 PM  Virtual Visit via Video Note   I, Freddy Finner, connected with  [name]  ([id number], [birth year]) on 03/26/23 at 12:15 PM EST by a video-enabled telemedicine application and verified that I am speaking with the correct person using two identifiers.  Location: Patient: Virtual Visit  Location Patient: Home Provider: Virtual Visit Location Provider: Home Office   I discussed the limitations of evaluation and management by telemedicine and the availability of in person appointments. The patient expressed understanding and agreed to proceed.    History of Present Illness: [name] is a 48 y.o. 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, known COVID test: + today Vaccines: series and covid booster in past (not the recent one)   Problems:  Patient Active Problem List   Diagnosis Date Noted   Bilateral leg edema 01/22/2023   Axillary lump, right 01/22/2023   Myofascial pain syndrome 06/02/2021   Hearing loss, mild right ear only 04/25/2021   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   Chronic pain syndrome 10/04/2020   Fibromyalgia 03/12/2020   Piriformis syndrome, left 11/27/2019   Tremor 03/16/2019   Prediabetes 02/09/2019   Hypothyroidism (acquired) 08/02/2018   Decreased cortisol level (HCC) 04/13/2018   Amenorrhea, unspecified 02/06/2018   Chronic fatigue 02/06/2018   Nonallopathic lesion of rib cage 10/08/2017   Antalgic gait 04/19/2017   Polyarthralgia 02/26/2016   GERD (gastroesophageal reflux disease) 02/17/2016   Cervical lymphadenopathy 01/21/2016   Nonallopathic lesion of thoracic region 08/23/2015  Lumbar radiculopathy 08/02/2015   SI (sacroiliac) joint dysfunction 03/20/2015   Scapular dysfunction 03/20/2015   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   Tendinopathy of right gluteal region 06/29/2014   Nonallopathic lesion of lumbosacral region 06/29/2014   Nonallopathic lesion of sacral region 06/29/2014    Nonallopathic lesion of pelvic region 06/29/2014   Biomechanical lesion, unspecified 06/29/2014   Recurrent UTI 05/18/2013   Lumbar trigger point syndrome 10/07/2010   Allergic rhinitis 08/14/2010   Essential hypertension, benign 01/22/2010    Allergies:  Allergies  Allergen Reactions   Lamisil [Terbinafine] Hives and Itching    In pill form   Medications:  Current Outpatient Medications:    Acetaminophen-Caffeine (EXCEDRIN TENSION HEADACHE) 500-65 MG TABS, Take 2 tablets by mouth daily as needed (headache)., Disp: , Rfl:    amLODipine (NORVASC) 5 MG tablet, Take 1 tablet (5 mg total) by mouth daily., Disp: 90 tablet, Rfl: 1   Cholecalciferol (VITAMIN D3) 2000 units TABS, Take 2,000 Units by mouth daily., Disp: , Rfl:    docusate sodium (COLACE) 100 MG capsule, Take 100 mg by mouth daily., Disp: , Rfl:    DOTTI 0.05 MG/24HR patch, Place 1 patch (0.05 mg total) onto the skin 2 (two) times a week., Disp: 24 patch, Rfl: 0   Estradiol-Progesterone 1-100 MG CAPS, Take 1 capsule by mouth daily at 2 am., Disp: , Rfl:    fluticasone (FLONASE) 50 MCG/ACT nasal spray, Place 2 sprays into both nostrils daily., Disp: 16 g, Rfl: 6   gabapentin (NEURONTIN) 100 MG capsule, Take 2 capsules (200 mg total) by mouth 2 (two) times daily. To take with Lyrica., Disp: 360 capsule, Rfl: 1   levothyroxine (SYNTHROID) 75 MCG tablet, Take 1 tablet (75 mcg total) by mouth daily before breakfast., Disp: 90 tablet, Rfl: 0   montelukast (SINGULAIR) 10 MG tablet, Take 1 tablet (10 mg total) by mouth at bedtime., Disp: 90 tablet, Rfl: 1   multivitamin-lutein (OCUVITE-LUTEIN) CAPS capsule, Take 1 capsule by mouth daily., Disp: , Rfl:    nitrofurantoin (MACRODANTIN) 50 MG capsule, Take as directed after intercourse, Disp: 45 capsule, Rfl: 0   omeprazole (PRILOSEC) 20 MG capsule, Take 20 mg by mouth daily., Disp: , Rfl:    Prasterone, DHEA, (DHEA ADVANCED FORMULA PO), Take 1 tablet by mouth daily. , Disp: , Rfl:     pregabalin (LYRICA) 150 MG capsule, Take 1 capsule (150 mg total) by mouth 2 (two) times daily., Disp: 60 capsule, Rfl: 5   progesterone (PROMETRIUM) 100 MG capsule, Take 1 capsule (100 mg total) by mouth at bedtime., Disp: 90 capsule, Rfl: 0   Propylene Glycol (SYSTANE COMPLETE OP), Place 1 drop into both eyes daily., Disp: , Rfl:    tiZANidine (ZANAFLEX) 4 MG tablet, Take 1 tablet (4 mg total) by mouth at bedtime., Disp: 30 tablet, Rfl: 0   traMADol (ULTRAM) 50 MG tablet, Take 1 tablet (50 mg total) by mouth every 12 (twelve) hours as needed for moderate pain, Disp: 30 tablet, Rfl: 0   Vilazodone HCl (VIIBRYD) 20 MG TABS, Take 1 tablet (20 mg total) by mouth daily., Disp: 90 tablet, Rfl: 0  Observations/Objective: Patient is well-developed, well-nourished in no acute distress.  Resting comfortably  at home.  Head is normocephalic, atraumatic.  No labored breathing.  Speech is clear and coherent with logical content.  Patient is alert and oriented at baseline.    Assessment and Plan:   1. COVID-19 (Primary)  - promethazine-dextromethorphan (PROMETHAZINE-DM) 6.25-15 MG/5ML syrup; Take 5 mLs by  mouth 4 (four) times daily as needed for cough.  Dispense: 118 mL; Refill: 0  - Continue OTC symptomatic management of choice -Given mild symptoms will refrain from antivirals at this time  - Info for COVID sent on AVS as well - Take prescribed medications as directed - Push fluids - Rest as needed - Discussed return precautions and when to seek in-person evaluation, sent via AVS as well    Reviewed side effects, risks and benefits of medication.    Patient acknowledged agreement and understanding of the plan.   Past Medical, Surgical, Social History, Allergies, and Medications have been Reviewed.    Follow Up Instructions: I discussed the assessment and treatment plan with the patient. The patient was provided an opportunity to ask questions and all were answered. The patient agreed  with the plan and demonstrated an understanding of the instructions.  A copy of instructions were sent to the patient via MyChart unless otherwise noted below.    The patient was advised to call back or seek an in-person evaluation if the symptoms worsen or if the condition fails to improve as anticipated.    Freddy Finner, NP

## 2023-03-26 NOTE — Patient Instructions (Signed)
Scharlene Gloss, thank you for joining Freddy Finner, NP for today's virtual visit.  While this provider is not your primary care provider (PCP), if your PCP is located in our provider database this encounter information will be shared with them immediately following your visit.   A [facility] MyChart account gives you access to today's visit and all your visits, tests, and labs performed at [facility] " click here if you don't have a [facility] MyChart account or go to [email or web address]  Consent: (Patient) [name] provided verbal consent for this virtual visit at the beginning of the encounter.  Current Medications:  Current Outpatient Medications:    promethazine-dextromethorphan (PROMETHAZINE-DM) 6.25-15 MG/5ML syrup, Take 5 mLs by mouth 4 (four) times daily as needed for cough., Disp: 118 mL, Rfl: 0   Acetaminophen-Caffeine (EXCEDRIN TENSION HEADACHE) 500-65 MG TABS, Take 2 tablets by mouth daily as needed (headache)., Disp: , Rfl:    amLODipine (NORVASC) 5 MG tablet, Take 1 tablet (5 mg total) by mouth daily., Disp: 90 tablet, Rfl: 1   Cholecalciferol (VITAMIN D3) 2000 units TABS, Take 2,000 Units by mouth daily., Disp: , Rfl:    docusate sodium (COLACE) 100 MG capsule, Take 100 mg by mouth daily., Disp: , Rfl:    DOTTI 0.05 MG/24HR patch, Place 1 patch (0.05 mg total) onto the skin 2 (two) times a week., Disp: 24 patch, Rfl: 0   Estradiol-Progesterone 1-100 MG CAPS, Take 1 capsule by mouth daily at 2 am., Disp: , Rfl:    fluticasone (FLONASE) 50 MCG/ACT nasal spray, Place 2 sprays into both nostrils daily., Disp: 16 g, Rfl: 6   gabapentin (NEURONTIN) 100 MG capsule, Take 2 capsules (200 mg total) by mouth 2 (two) times daily. To take with Lyrica., Disp: 360 capsule, Rfl: 1   levothyroxine (SYNTHROID) 75 MCG tablet, Take 1 tablet (75 mcg total) by mouth daily before breakfast., Disp: 90 tablet, Rfl: 0   montelukast (SINGULAIR) 10 MG tablet, Take  1 tablet (10 mg total) by mouth at bedtime., Disp: 90 tablet, Rfl: 1   multivitamin-lutein (OCUVITE-LUTEIN) CAPS capsule, Take 1 capsule by mouth daily., Disp: , Rfl:    nitrofurantoin (MACRODANTIN) 50 MG capsule, Take as directed after intercourse, Disp: 45 capsule, Rfl: 0   omeprazole (PRILOSEC) 20 MG capsule, Take 20 mg by mouth daily., Disp: , Rfl:    Prasterone, DHEA, (DHEA ADVANCED FORMULA PO), Take 1 tablet by mouth daily. , Disp: , Rfl:    pregabalin (LYRICA) 150 MG capsule, Take 1 capsule (150 mg total) by mouth 2 (two) times daily., Disp: 60 capsule, Rfl: 5   progesterone (PROMETRIUM) 100 MG capsule, Take 1 capsule (100 mg total) by mouth at bedtime., Disp: 90 capsule, Rfl: 0   Propylene Glycol (SYSTANE COMPLETE OP), Place 1 drop into both eyes daily., Disp: , Rfl:    tiZANidine (ZANAFLEX) 4 MG tablet, Take 1 tablet (4 mg total) by mouth at bedtime., Disp: 30 tablet, Rfl: 0   traMADol (ULTRAM) 50 MG tablet, Take 1 tablet (50 mg total) by mouth every 12 (twelve) hours as needed for moderate pain, Disp: 30 tablet, Rfl: 0   Vilazodone HCl (VIIBRYD) 20 MG TABS, Take 1 tablet (20 mg total) by mouth daily., Disp: 90 tablet, Rfl: 0   Medications ordered in this encounter:  Meds ordered this encounter  Medications   promethazine-dextromethorphan (PROMETHAZINE-DM) 6.25-15 MG/5ML syrup    Sig: Take 5 mLs by mouth 4 (four) times daily as needed for cough.  Dispense:  118 mL    Refill:  0    Supervising Provider:   Merrilee Jansky [[id number]]     *If you need refills on other medications prior to your next appointment, please contact your pharmacy*  Follow-Up: Call back or seek an in-person evaluation if the symptoms worsen or if the condition fails to improve as anticipated.  [facility] [phone]  Care Instructions:  - Continue OTC symptomatic management of choice  - Take prescribed medications as directed - Push fluids - Rest as needed - Discussed return  precautions and when to seek in-person evaluation   Isolation Instructions: You are to isolate at home until you have been fever free for at least 24 hours without a fever-reducing medication, and symptoms have been steadily improving for 24 hours. At that time,  you can end isolation but need to mask for an additional 5 days.   If you must be around other household members who do not have symptoms, you need to make sure that both you and the family members are masking consistently with a high-quality mask.  If you note any worsening of symptoms despite treatment, please seek an in-person evaluation ASAP. If you note any significant shortness of breath or any chest pain, please seek ER evaluation. Please do not delay care!   COVID-19: What to Do if You Are Sick If you test positive and are an older adult or someone who is at high risk of getting very sick from COVID-19, treatment may be available. Contact a healthcare provider right away after a positive test to determine if you are eligible, even if your symptoms are mild right now. You can also visit a Test to Treat location and, if eligible, receive a prescription from a provider. Don't delay: Treatment must be started within the first few days to be effective. If you have a fever, cough, or other symptoms, you might have COVID-19. Most people have mild illness and are able to recover at home. If you are sick: Keep track of your symptoms. If you have an emergency warning sign (including trouble breathing), call 911. Steps to help prevent the spread of COVID-19 if you are sick If you are sick with COVID-19 or think you might have COVID-19, follow the steps below to care for yourself and to help protect other people in your home and community. Stay home except to get medical care Stay home. Most people with COVID-19 have mild illness and can recover at home without medical care. Do not leave your home, except to get medical care. Do not visit  public areas and do not go to places where you are unable to wear a mask. Take care of yourself. Get rest and stay hydrated. Take over-the-counter medicines, such as acetaminophen, to help you feel better. Stay in touch with your doctor. Call before you get medical care. Be sure to get care if you have trouble breathing, or have any other emergency warning signs, or if you think it is an emergency. Avoid public transportation, ride-sharing, or taxis if possible. Get tested If you have symptoms of COVID-19, get tested. While waiting for test results, stay away from others, including staying apart from those living in your household. Get tested as soon as possible after your symptoms start. Treatments may be available for people with COVID-19 who are at risk for becoming very sick. Don't delay: Treatment must be started early to be effective--some treatments must begin within 5 days of your  first symptoms. Contact your healthcare provider right away if your test result is positive to determine if you are eligible. Self-tests are one of several options for testing for the virus that causes COVID-19 and may be more convenient than laboratory-based tests and point-of-care tests. Ask your healthcare provider or your local health department if you need help interpreting your test results. You can visit your state, tribal, local, and territorial health department's website to look for the latest local information on testing sites. Separate yourself from other people As much as possible, stay in a specific room and away from other people and pets in your home. If possible, you should use a separate bathroom. If you need to be around other people or animals in or outside of the home, wear a well-fitting mask. Tell your close contacts that they may have been exposed to COVID-19. An infected person can spread COVID-19 starting 48 hours (or 2 days) before the person has any symptoms or tests positive. By letting your  close contacts know they may have been exposed to COVID-19, you are helping to protect everyone. See COVID-19 and Animals if you have questions about pets. If you are diagnosed with COVID-19, someone from the health department may call you. Answer the call to slow the spread. Monitor your symptoms Symptoms of COVID-19 include fever, cough, or other symptoms. Follow care instructions from your healthcare provider and local health department. Your local health authorities may give instructions on checking your symptoms and reporting information. When to seek emergency medical attention Look for emergency warning signs* for COVID-19. If someone is showing any of these signs, seek emergency medical care immediately: Trouble breathing Persistent pain or pressure in the chest New confusion Inability to wake or stay awake Pale, gray, or blue-colored skin, lips, or nail beds, depending on skin tone *This list is not all possible symptoms. Please call your medical provider for any other symptoms that are severe or concerning to you. Call 911 or call ahead to your local emergency facility: Notify the operator that you are seeking care for someone who has or may have COVID-19. Call ahead before visiting your doctor Call ahead. Many medical visits for routine care are being postponed or done by phone or telemedicine. If you have a medical appointment that cannot be postponed, call your doctor's office, and tell them you have or may have COVID-19. This will help the office protect themselves and other patients. If you are sick, wear a well-fitting mask You should wear a mask if you must be around other people or animals, including pets (even at home). Wear a mask with the best fit, protection, and comfort for you. You don't need to wear the mask if you are alone. If you can't put on a mask (because of trouble breathing, for example), cover your coughs and sneezes in some other way. Try to stay at least 6  feet away from other people. This will help protect the people around you. Masks should not be placed on young children under age 84 years, anyone who has trouble breathing, or anyone who is not able to remove the mask without help. Cover your coughs and sneezes Cover your mouth and nose with a tissue when you cough or sneeze. Throw away used tissues in a lined trash can. Immediately wash your hands with soap and water for at least 20 seconds. If soap and water are not available, clean your hands with an alcohol-based hand sanitizer that contains at least 60% alcohol.  Clean your hands often Wash your hands often with soap and water for at least 20 seconds. This is especially important after blowing your nose, coughing, or sneezing; going to the bathroom; and before eating or preparing food. Use hand sanitizer if soap and water are not available. Use an alcohol-based hand sanitizer with at least 60% alcohol, covering all surfaces of your hands and rubbing them together until they feel dry. Soap and water are the best option, especially if hands are visibly dirty. Avoid touching your eyes, nose, and mouth with unwashed hands. Handwashing Tips Avoid sharing personal household items Do not share dishes, drinking glasses, cups, eating utensils, towels, or bedding with other people in your home. Wash these items thoroughly after using them with soap and water or put in the dishwasher. Clean surfaces in your home regularly Clean and disinfect high-touch surfaces (for example, doorknobs, tables, handles, light switches, and countertops) in your "sick room" and bathroom. In shared spaces, you should clean and disinfect surfaces and items after each use by the person who is ill. If you are sick and cannot clean, a caregiver or other person should only clean and disinfect the area around you (such as your bedroom and bathroom) on an as needed basis. Your caregiver/other person should wait as long as possible  (at least several hours) and wear a mask before entering, cleaning, and disinfecting shared spaces that you use. Clean and disinfect areas that may have blood, stool, or body fluids on them. Use household cleaners and disinfectants. Clean visible dirty surfaces with household cleaners containing soap or detergent. Then, use a household disinfectant. Use a product from [employer] List N: Disinfectants for Coronavirus (COVID-19). Be sure to follow the instructions on the label to ensure safe and effective use of the product. Many products recommend keeping the surface wet with a disinfectant for a certain period of time (look at "contact time" on the product label). You may also need to wear personal protective equipment, such as gloves, depending on the directions on the product label. Immediately after disinfecting, wash your hands with soap and water for 20 seconds. For completed guidance on cleaning and disinfecting your home, visit Complete Disinfection Guidance. Take steps to improve ventilation at home Improve ventilation (air flow) at home to help prevent from spreading COVID-19 to other people in your household. Clear out COVID-19 virus particles in the air by opening windows, using air filters, and turning on fans in your home. Use this interactive tool to learn how to improve air flow in your home. When you can be around others after being sick with COVID-19 Deciding when you can be around others is different for different situations. Find out when you can safely end home isolation. For any additional questions about your care, contact your healthcare provider or state or local health department. 05/28/2020 Content source: [facility] for Immunization and Respiratory Diseases ([facility]), Division of Viral Diseases This information is not intended to replace advice given to you by your health care provider. Make sure you discuss any questions you have with your health care provider. Document  Revised: 07/11/2020 Document Reviewed: 07/11/2020 Elsevier Patient Education  2022 [employer].     If you have been instructed to have an in-person evaluation today at a local Urgent Care facility, please use the link below. It will take you to a list of all of our available [facility], including address, phone number and hours of operation. Please do not delay care.  [facility]  If you or a family member do not have a primary care provider, use the link below to schedule a visit and establish care. When you choose a [facility] primary care physician or advanced practice provider, you gain a long-term partner in health. Find a Primary Care Provider  Learn more about [facility] in-office and virtual care options:  - Get Care Now

## 2023-03-28 ENCOUNTER — Other Ambulatory Visit: Payer: Self-pay | Admitting: Internal Medicine

## 2023-03-28 ENCOUNTER — Other Ambulatory Visit: Payer: Self-pay | Admitting: Family Medicine

## 2023-03-29 ENCOUNTER — Other Ambulatory Visit (HOSPITAL_COMMUNITY): Payer: Self-pay

## 2023-03-29 ENCOUNTER — Other Ambulatory Visit: Payer: Self-pay

## 2023-03-29 MED ORDER — AMLODIPINE BESYLATE 5 MG PO TABS
5.0000 mg | ORAL_TABLET | Freq: Every day | ORAL | 1 refills | Status: DC
  Filled 2023-03-29: qty 90, 90d supply, fill #0
  Filled 2023-07-01: qty 90, 90d supply, fill #1

## 2023-03-30 ENCOUNTER — Other Ambulatory Visit (HOSPITAL_COMMUNITY): Payer: Self-pay

## 2023-03-30 MED ORDER — VILAZODONE HCL 20 MG PO TABS
1.0000 | ORAL_TABLET | Freq: Every day | ORAL | 0 refills | Status: DC
  Filled 2023-03-30: qty 90, 90d supply, fill #0

## 2023-04-02 ENCOUNTER — Encounter
Payer: Commercial Managed Care - PPO | Attending: Physical Medicine and Rehabilitation | Admitting: Physical Medicine and Rehabilitation

## 2023-04-02 ENCOUNTER — Encounter: Payer: Self-pay | Admitting: Physical Medicine and Rehabilitation

## 2023-04-02 VITALS — BP 130/91 | HR 75 | Ht 63.0 in | Wt 150.4 lb

## 2023-04-02 DIAGNOSIS — F341 Dysthymic disorder: Secondary | ICD-10-CM | POA: Diagnosis not present

## 2023-04-02 DIAGNOSIS — M5416 Radiculopathy, lumbar region: Secondary | ICD-10-CM | POA: Diagnosis not present

## 2023-04-02 DIAGNOSIS — M7918 Myalgia, other site: Secondary | ICD-10-CM | POA: Diagnosis not present

## 2023-04-02 DIAGNOSIS — G894 Chronic pain syndrome: Secondary | ICD-10-CM

## 2023-04-02 DIAGNOSIS — F419 Anxiety disorder, unspecified: Secondary | ICD-10-CM | POA: Diagnosis not present

## 2023-04-02 NOTE — Patient Instructions (Signed)
Pt is a 48 yr old female with dx of Fibromyalgia, HTN, and hypothyroidism- last TSH 1.07;  Has medullar sponge kidney.  Here for evaluation of fibromyalgia- got dx 10/21- but Sx's since 2017. Also has myofascial pain syndrome- Here for f/u on Fibromyalgia and to get Trigger point injections.   Dr Katrinka Blazing- DO/OMT doesn't think pt has Fibromyalgia per his note.  Has a plan for meal prepping to cut out sugar in body as well as wheat and dairy as well as breathing /meditation. I think this is a great to reduce pain for her long term- decreasing inflammation is ideal.   2.  Suggest adding tumeric to diet- traditional dosing is 500 mg 2/day- if you take capsules.   3. Con't Lyrica- I suggest using Lyrica 2nd dose/day for those BAD days, to try keep pain levels down.   4. con't gabapentin- 300 mg/day total 100 mg in Am and 200 mg at bedtime. Doesn't need refills- call me if does  5. On a combo pill for HRT-  should help some of pain issues or at least not INCREASE pain levels to be on HRT.   6. Gets Tramadol from Dr Lawerance Bach   7.  F/U in 3months- and let me know how low sugar/low inflammation diet going.

## 2023-04-02 NOTE — Progress Notes (Signed)
Subjective:    Patient ID: [name], female    DOB: 1975/09/14, 48 y.o.   MRN: [id number]  HPI Pt is a 48 yr old female with dx of Fibromyalgia, HTN, and hypothyroidism- last TSH 1.07;  Has medullar sponge kidney.  Here for evaluation of fibromyalgia- got dx 10/21- but Sx's since 2017. Also has myofascial pain syndrome- Here for f/u on Fibromyalgia and to get Trigger point injections.   Dr Katrinka Blazing- DO/OMT doesn't think pt has Fibromyalgia per his note.   Got steroid injection cocktail 03/14/23 with Dr Katrinka Blazing-  calmed things down a little.  Tries not to take steroids- gives her a migraine and and all over the place.    We talked about increasing Lyrica 150 mg BID- libido was really bad at 2x/day- backed off to 1x/day in AM now.  Taking gabapentin 100 mg in AM and 200 mg at bedtime.  More of flare in last 6 months than has ever had before.   Tramadol- still only fills q3 months- 30 tabs each time-  was taking it more early January, total body aches and very bad back pain.  Just trying to function. And work. And take care of home.   Never more than 1x/day.  Will take ~ 10am- most of day is covered.   And still takes tylenol and sometimes Ibuprofen- no more than 200 mg/day because causes severe bruising when takes more.   Mainly for back pain.  Doesn't help whole body pain very much, but helps back pain.   Getting ready to start to decrease inflammatory substances- like sugar/wheat/caffeine and dairy. Through a plan. Also to do meditation and breathing as well.   Too much time in survival mode!  Has an app paying for -for yoga.   Pain Inventory Average Pain 5 Pain Right Now 5 My pain is  generalized  In the last 24 hours, has pain interfered with the following? General activity 5 Relation with others 3 Enjoyment of life 5 What TIME of day is your pain at its worst? varies Sleep (in general) Fair  Pain is worse with: unsure Pain improves with: rest and  medication Relief from Meds: 3  Family History  Problem Relation Age of Onset   Breast cancer Mother    Hypertension Father    Hyperlipidemia Father    Other Brother        lyme-chronic   Stroke Maternal Grandmother    Alcohol abuse Paternal Grandmother    Heart disease Paternal Grandfather    Colon cancer Neg Hx    Esophageal cancer Neg Hx    Rectal cancer Neg Hx    Social History   Socioeconomic History   Marital status: Married    Spouse name: Not on file   Number of children: 3   Years of education: Not on file   Highest education level: Bachelor's degree (e.g., BA, AB, BS)  Occupational History   Occupation: Teacher, adult education: [employer]  Tobacco Use   Smoking status: Never   Smokeless tobacco: Never  Vaping Use   Vaping status: Never Used  Substance and Sexual Activity   Alcohol use: Yes   Drug use: No   Sexual activity: Yes  Other Topics Concern   Not on file  Social History Narrative   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, OR   Social Drivers of Health   Financial Resource Strain: Low Risk  (01/22/2023)   Overall Financial Resource Strain (CARDIA)  Difficulty of Paying Living Expenses: Not hard at all  Food Insecurity: No Food Insecurity (01/22/2023)   Hunger Vital Sign    Worried About Running Out of Food in the Last Year: Never true    Ran Out of Food in the Last Year: Never true  Transportation Needs: No Transportation Needs (01/22/2023)   PRAPARE - Administrator, Civil Service (Medical): No    Lack of Transportation (Non-Medical): No  Physical Activity: Insufficiently Active (01/22/2023)   Exercise Vital Sign    Days of Exercise per Week: 1 day    Minutes of Exercise per Session: 10 min  Stress: Stress Concern Present (01/22/2023)   [employer] of Occupational Health - Occupational Stress Questionnaire    Feeling of Stress : To some extent  Social Connections: Moderately Isolated (01/22/2023)   Social Connection and  Isolation Panel [NHANES]    Frequency of Communication with Friends and Family: Once a week    Frequency of Social Gatherings with Friends and Family: Once a week    Attends Religious Services: 1 to 4 times per year    Active Member of [employer] or Organizations: No    Attends Engineer, structural: Not on file    Marital Status: Married   Past Surgical History:  Procedure Laterality Date   CHOLECYSTECTOMY     DILATION AND CURETTAGE OF UTERUS     ENDOMETRIAL ABLATION     NOVASURE ABLATION     TUBAL LIGATION     Past Surgical History:  Procedure Laterality Date   CHOLECYSTECTOMY     DILATION AND CURETTAGE OF UTERUS     ENDOMETRIAL ABLATION     NOVASURE ABLATION     TUBAL LIGATION     Past Medical History:  Diagnosis Date   Allergy    Autoimmune disorder (HCC)    Crohn's disease (HCC)    Endometriosis    Hypertension    Kidney, medullary sponge    Ovarian cyst    Pre-eclampsia    Raynaud's disease    Stress incontinence in female    BP (!) 130/91   Pulse 75   Ht 5\' 3"  (1.6 m)   Wt 150 lb 6.4 oz (68.2 kg)   SpO2 99%   BMI 26.64 kg/m   Opioid Risk Score:   Fall Risk Score:  `1  Depression screen [facility] 2/9     04/02/2023    1:43 PM 01/22/2023    2:32 PM 12/28/2022    3:34 PM 10/27/2022    3:41 PM 06/19/2022    1:04 PM 11/21/2021    2:35 PM 09/19/2021    2:05 PM  Depression screen PHQ 2/9  Decreased Interest 2 1 1  0 0 0 0  Down, Depressed, Hopeless 2 1 1  0 0 0 0  PHQ - 2 Score 4 2 2  0 0 0 0  Altered sleeping  2       Tired, decreased energy  2       Change in appetite  0       Feeling bad or failure about yourself   0       Trouble concentrating  0       Moving slowly or fidgety/restless  0       Suicidal thoughts  0       PHQ-9 Score  6       Difficult doing work/chores  Somewhat difficult          Review of Systems  Musculoskeletal:  Positive for myalgias.  All other systems reviewed and are negative.      Objective:   Physical Exam  Awake,  alert, appropriate, flattened affect, NAD  Holding self very still again- doesn't like to move.  TTP with trigger points palpated  in rhomboids as well and paraspinals all the way to low back/lumbar spine-  also in upper traps. And pecs.       Assessment & Plan:   Pt is a 48 yr old female with dx of Fibromyalgia, HTN, and hypothyroidism- last TSH 1.07;  Has medullar sponge kidney.  Here for evaluation of fibromyalgia- got dx 10/21- but Sx's since 2017. Also has myofascial pain syndrome- Here for f/u on Fibromyalgia and to get Trigger point injections.   Dr Katrinka Blazing- DO/OMT doesn't think pt has Fibromyalgia per his note.  Has a plan for meal prepping to cut out sugar in body as well as wheat and dairy as well as breathing /meditation. I think this is a great to reduce pain for her long term- decreasing inflammation is ideal.   2.  Suggest adding tumeric to diet- traditional dosing is 500 mg 2/day- if you take capsules.   3. Con't Lyrica- I suggest using Lyrica 2nd dose/day for those BAD days, to try keep pain levels down.   4. con't gabapentin- 300 mg/day total 100 mg in Am and 200 mg at bedtime. Doesn't need refills- call me if does  5. On a combo pill for HRT-  should help some of pain issues or at least not INCREASE pain levels to be on HRT.   6. Gets Tramadol from Dr Lawerance Bach   7.  F/U in 3months- and let me know how low sugar/low inflammation diet going.     I spent a total of 20   minutes on total care today- >50% coordination of care- due to as detailed above.

## 2023-04-09 ENCOUNTER — Other Ambulatory Visit (HOSPITAL_COMMUNITY): Payer: Self-pay

## 2023-04-12 DIAGNOSIS — M797 Fibromyalgia: Secondary | ICD-10-CM | POA: Diagnosis not present

## 2023-04-12 DIAGNOSIS — M25512 Pain in left shoulder: Secondary | ICD-10-CM | POA: Diagnosis not present

## 2023-04-12 DIAGNOSIS — M545 Low back pain, unspecified: Secondary | ICD-10-CM | POA: Diagnosis not present

## 2023-04-13 NOTE — Progress Notes (Signed)
 [name] [address] [place] [address] Phone: [phone] Subjective:   [name], am serving as a scribe for Dr. Arthea Claudene.  I'm seeing this patient by the request  of:  Geofm Glade PARAS, MD  CC: Back pain, neck pain, polyarthralgia follow-up  [id number]  [name] is a 48 y.o. female coming in with complaint of back and neck pain. OMT on 03/08/2023. Patient states that she is the same as last visit. No new issiues.   Medications patient has been prescribed: viibryd  synthroid   Taking:         Reviewed prior external information including notes and imaging from previsou exam, outside providers and external EMR if available.   As well as notes that were available from care everywhere and other healthcare systems.  Past medical history, social, surgical and family history all reviewed in electronic medical record.  No pertanent information unless stated regarding to the chief complaint.   Past Medical History:  Diagnosis Date   Allergy    Autoimmune disorder (HCC)    Crohn's disease (HCC)    Endometriosis    Hypertension    Kidney, medullary sponge    Ovarian cyst    Pre-eclampsia    Raynaud's disease    Stress incontinence in female     Allergies  Allergen Reactions   Lamisil [Terbinafine] Hives and Itching    In pill form     Review of Systems:  No headache, visual changes, nausea, vomiting, diarrhea, constipation, dizziness, abdominal pain, skin rash, fevers, chills, night sweats, weight loss, swollen lymph nodes, body aches, joint swelling, chest pain, shortness of breath, mood changes. POSITIVE muscle aches  Objective  Blood pressure 124/84, pulse 64, height 5' 3 (1.6 m), weight 148 lb (67.1 kg), SpO2 96%.   General: No apparent distress alert and oriented x3 mood and affect normal, dressed appropriately.  HEENT: Pupils equal, extraocular movements intact  Respiratory: Patient's speak in  full sentences and does not appear short of breath  Cardiovascular: No lower extremity edema, non tender, no erythema  MSK:  Back does have some loss lordosis noted.  Some tenderness to palpation in the paraspinal musculature.  Patient's neck exam does have some limited sidebending bilaterally.  Osteopathic findings  C2 flexed rotated and side bent right C6 flexed rotated and side bent left T3 extended rotated and side bent right inhaled rib T7 extended rotated and side bent left L3 flexed rotated and side bent right Sacrum right on right       Assessment and Plan:  SI (sacroiliac) joint dysfunction Sacroiliac dysfunction.  Discussed which activities to do and which ones to avoid.  Increase activity slowly overall.  Discussed which activities would be helpful with the potentially aggravating.  No other significant changes in medication at this point.  Follow-up again in 6 to 8 weeks.    Nonallopathic problems  Decision today to treat with OMT was based on Physical Exam  After verbal consent patient was treated with HVLA, ME, FPR techniques in cervical, rib, thoracic, lumbar, and sacral  areas  Patient tolerated the procedure well with improvement in symptoms  Patient given exercises, stretches and lifestyle modifications  See medications in patient instructions if given  Patient will follow up in 4-8 weeks     The above documentation has been reviewed and is accurate and complete Storey Stangeland M Solon Alban, DO         Note: This dictation was prepared with [place]  dictation along with smaller phrase technology. Any transcriptional errors that result from this process are unintentional.

## 2023-04-16 ENCOUNTER — Ambulatory Visit: Payer: Commercial Managed Care - PPO | Admitting: Family Medicine

## 2023-04-16 VITALS — BP 124/84 | HR 64 | Ht 63.0 in | Wt 148.0 lb

## 2023-04-16 DIAGNOSIS — M9903 Segmental and somatic dysfunction of lumbar region: Secondary | ICD-10-CM | POA: Diagnosis not present

## 2023-04-16 DIAGNOSIS — M533 Sacrococcygeal disorders, not elsewhere classified: Secondary | ICD-10-CM

## 2023-04-16 DIAGNOSIS — F419 Anxiety disorder, unspecified: Secondary | ICD-10-CM | POA: Diagnosis not present

## 2023-04-16 DIAGNOSIS — M9901 Segmental and somatic dysfunction of cervical region: Secondary | ICD-10-CM | POA: Diagnosis not present

## 2023-04-16 DIAGNOSIS — M9902 Segmental and somatic dysfunction of thoracic region: Secondary | ICD-10-CM | POA: Diagnosis not present

## 2023-04-16 DIAGNOSIS — F341 Dysthymic disorder: Secondary | ICD-10-CM | POA: Diagnosis not present

## 2023-04-16 DIAGNOSIS — M9908 Segmental and somatic dysfunction of rib cage: Secondary | ICD-10-CM

## 2023-04-16 DIAGNOSIS — M9904 Segmental and somatic dysfunction of sacral region: Secondary | ICD-10-CM

## 2023-04-16 NOTE — Patient Instructions (Signed)
 Good to see you Overall doing great Hope you reap benefits soon See me in 6-8 weeks

## 2023-04-18 ENCOUNTER — Encounter: Payer: Self-pay | Admitting: Family Medicine

## 2023-04-18 NOTE — Assessment & Plan Note (Signed)
 Sacroiliac dysfunction.  Discussed which activities to do and which ones to avoid.  Increase activity slowly overall.  Discussed which activities would be helpful with the potentially aggravating.  No other significant changes in medication at this point.  Follow-up again in 6 to 8 weeks.

## 2023-04-27 DIAGNOSIS — F341 Dysthymic disorder: Secondary | ICD-10-CM | POA: Diagnosis not present

## 2023-04-27 DIAGNOSIS — F419 Anxiety disorder, unspecified: Secondary | ICD-10-CM | POA: Diagnosis not present

## 2023-05-14 DIAGNOSIS — F419 Anxiety disorder, unspecified: Secondary | ICD-10-CM | POA: Diagnosis not present

## 2023-05-14 DIAGNOSIS — F341 Dysthymic disorder: Secondary | ICD-10-CM | POA: Diagnosis not present

## 2023-05-21 ENCOUNTER — Other Ambulatory Visit (HOSPITAL_COMMUNITY): Payer: Self-pay

## 2023-05-21 DIAGNOSIS — Z79890 Hormone replacement therapy (postmenopausal): Secondary | ICD-10-CM | POA: Diagnosis not present

## 2023-05-21 MED ORDER — DOTTI 0.05 MG/24HR TD PTTW
1.0000 | MEDICATED_PATCH | TRANSDERMAL | 3 refills | Status: AC
  Filled 2023-07-01: qty 24, 84d supply, fill #0
  Filled 2023-09-21: qty 24, 84d supply, fill #1
  Filled 2023-12-21: qty 24, 84d supply, fill #2
  Filled 2024-03-07: qty 24, 84d supply, fill #0

## 2023-05-21 MED ORDER — PROGESTERONE MICRONIZED 100 MG PO CAPS
100.0000 mg | ORAL_CAPSULE | Freq: Every day | ORAL | 3 refills | Status: AC
  Filled 2023-05-21 – 2023-07-01 (×2): qty 90, 90d supply, fill #0
  Filled 2023-10-03: qty 90, 90d supply, fill #1
  Filled 2024-01-05: qty 90, 90d supply, fill #2
  Filled 2024-03-30: qty 90, 90d supply, fill #0

## 2023-05-27 ENCOUNTER — Other Ambulatory Visit: Payer: Self-pay

## 2023-05-27 NOTE — Progress Notes (Signed)
 [name] [address] [place] [address] Phone: [phone] Subjective:   [name], am serving as a scribe for Dr. Antoine Primas.  I'm seeing this patient by the request  of:  Pincus Sanes, MD  CC: Back and neck pain follow-up  [id number]  [name] is a 48 y.o. female coming in with complaint of back and neck pain. OMT on 04/16/2023. Patient states that she continues to have pain in the R SI joint. Considering epidural injection.   Medications patient has been prescribed: Viibryd  Taking:         Reviewed prior external information including notes and imaging from previsou exam, outside providers and external EMR if available.   As well as notes that were available from care everywhere and other healthcare systems.  Past medical history, social, surgical and family history all reviewed in electronic medical record.  No pertanent information unless stated regarding to the chief complaint.   Past Medical History:  Diagnosis Date   Allergy    Autoimmune disorder (HCC)    Crohn's disease (HCC)    Endometriosis    Hypertension    Kidney, medullary sponge    Ovarian cyst    Pre-eclampsia    Raynaud's disease    Stress incontinence in female     Allergies  Allergen Reactions   Lamisil [Terbinafine] Hives and Itching    In pill form     Review of Systems:  No headache, visual changes, nausea, vomiting, diarrhea, constipation, dizziness, abdominal pain, skin rash, fevers, chills, night sweats,  swollen lymph nodes,  joint swelling, chest pain, shortness of breath, mood changes. POSITIVE muscle aches, body aches, weight loss  Objective  Blood pressure 122/82, pulse 80, height 5\' 3"  (1.6 m), SpO2 96%.   General: No apparent distress alert and oriented x3 mood and affect normal, dressed appropriately.  HEENT: Pupils equal, extraocular movements intact  Respiratory: Patient's speak in full sentences and does  not appear short of breath  Cardiovascular: No lower extremity edema, non tender, no erythema  Gait relatively normal MSK:  Back does have some loss lordosis noted.  Some tenderness to palpation in the paraspinal musculature patient does have tightness noted in the neck with sidebending bilaterally.  Patient does have a positive straight leg test which is new.  Seems to be left greater than right.  Seems to be though bilateral with some radicular symptoms in the L5 distribution.  4-5 strength of the lower extremities with dorsi flexion  Osteopathic findings  C2 flexed rotated and side bent right C4 flexed rotated and side bent left T3 extended rotated and side bent right inhaled rib T4 extended rotated and side bent left L4 flexed rotated and side bent left Sacrum right on right       Assessment and Plan:  SI (sacroiliac) joint dysfunction Pain though has been out of proportion.  Lower affecting daily activities on a more regular basis.  Concerned that there is more of a nerve root impingement that could be contributing as well.  Do feel that advanced imaging is warranted an MRI of the lumbar and pelvis today to rule out any occult fractures that could be potentially contributing.  With patient also having radicular symptoms rule out any type of imaging management on the nerve secondary to a bulging disc.  Depending on findings this could change medical management.  Sacroiliac dysfunction noted.  Discussed icing regimen and home exercises, discussed which activities to  do and which ones to avoid.  Discussed increasing activity.  Follow-up again in 6 to 8 weeks.    Nonallopathic problems  Decision today to treat with OMT was based on Physical Exam  After verbal consent patient was treated with  ME, FPR techniques in cervical, rib, thoracic, lumbar, and sacral  areas avoided HVLA with patient having significant amount of discomfort today.  Patient tolerated the procedure well with  improvement in symptoms  Patient given exercises, stretches and lifestyle modifications  See medications in patient instructions if given  Patient will follow up in 4-8 weeks    The above documentation has been reviewed and is accurate and complete Judi Saa, DO          Note: This dictation was prepared with Dragon dictation along with smaller phrase technology. Any transcriptional errors that result from this process are unintentional.

## 2023-05-28 ENCOUNTER — Ambulatory Visit: Payer: Commercial Managed Care - PPO | Admitting: Family Medicine

## 2023-05-28 ENCOUNTER — Ambulatory Visit

## 2023-05-28 VITALS — BP 122/82 | HR 80 | Ht 63.0 in

## 2023-05-28 DIAGNOSIS — M9904 Segmental and somatic dysfunction of sacral region: Secondary | ICD-10-CM

## 2023-05-28 DIAGNOSIS — M25552 Pain in left hip: Secondary | ICD-10-CM | POA: Diagnosis not present

## 2023-05-28 DIAGNOSIS — M9908 Segmental and somatic dysfunction of rib cage: Secondary | ICD-10-CM | POA: Diagnosis not present

## 2023-05-28 DIAGNOSIS — M545 Low back pain, unspecified: Secondary | ICD-10-CM

## 2023-05-28 DIAGNOSIS — F419 Anxiety disorder, unspecified: Secondary | ICD-10-CM | POA: Diagnosis not present

## 2023-05-28 DIAGNOSIS — M25551 Pain in right hip: Secondary | ICD-10-CM

## 2023-05-28 DIAGNOSIS — M9903 Segmental and somatic dysfunction of lumbar region: Secondary | ICD-10-CM

## 2023-05-28 DIAGNOSIS — M533 Sacrococcygeal disorders, not elsewhere classified: Secondary | ICD-10-CM | POA: Diagnosis not present

## 2023-05-28 DIAGNOSIS — F341 Dysthymic disorder: Secondary | ICD-10-CM | POA: Diagnosis not present

## 2023-05-28 DIAGNOSIS — M9901 Segmental and somatic dysfunction of cervical region: Secondary | ICD-10-CM | POA: Diagnosis not present

## 2023-05-28 DIAGNOSIS — M4807 Spinal stenosis, lumbosacral region: Secondary | ICD-10-CM | POA: Diagnosis not present

## 2023-05-28 DIAGNOSIS — M9902 Segmental and somatic dysfunction of thoracic region: Secondary | ICD-10-CM | POA: Diagnosis not present

## 2023-05-28 DIAGNOSIS — G8929 Other chronic pain: Secondary | ICD-10-CM | POA: Diagnosis not present

## 2023-05-28 NOTE — Patient Instructions (Addendum)
 Xray today MRI lumbar and pelvis Allergy and Asthma: Dr. Delorse Lek or Dr. Rosalio Loud See me again in 6-8 weeks

## 2023-05-29 ENCOUNTER — Encounter: Payer: Self-pay | Admitting: Family Medicine

## 2023-05-29 NOTE — Assessment & Plan Note (Addendum)
 Pain though has been out of proportion.  Lower affecting daily activities on a more regular basis.  Concerned that there is more of a nerve root impingement that could be contributing as well.  Do feel that advanced imaging is warranted an MRI of the lumbar and pelvis today to rule out any occult fractures that could be potentially contributing.  With patient also having radicular symptoms rule out any type of imaging management on the nerve secondary to a bulging disc.  Depending on findings this could change medical management.  Sacroiliac dysfunction noted.  Discussed icing regimen and home exercises, discussed which activities to do and which ones to avoid.  Discussed increasing activity.  Follow-up again in 6 to 8 weeks.

## 2023-06-08 ENCOUNTER — Other Ambulatory Visit: Payer: Self-pay | Admitting: Family Medicine

## 2023-06-09 ENCOUNTER — Other Ambulatory Visit (HOSPITAL_COMMUNITY): Payer: Self-pay

## 2023-06-09 MED ORDER — LEVOTHYROXINE SODIUM 75 MCG PO TABS
75.0000 ug | ORAL_TABLET | Freq: Every day | ORAL | 0 refills | Status: DC
  Filled 2023-06-09: qty 90, 90d supply, fill #0

## 2023-06-10 ENCOUNTER — Encounter: Payer: Self-pay | Admitting: Family Medicine

## 2023-06-13 ENCOUNTER — Other Ambulatory Visit: Payer: Self-pay | Admitting: Internal Medicine

## 2023-06-14 ENCOUNTER — Other Ambulatory Visit (HOSPITAL_COMMUNITY): Payer: Self-pay

## 2023-06-14 MED ORDER — NITROFURANTOIN MACROCRYSTAL 50 MG PO CAPS
50.0000 mg | ORAL_CAPSULE | ORAL | 0 refills | Status: AC
  Filled 2023-06-14: qty 45, 45d supply, fill #0

## 2023-06-18 ENCOUNTER — Ambulatory Visit
Admission: RE | Admit: 2023-06-18 | Discharge: 2023-06-18 | Disposition: A | Discharge status: Home / Self Care | Source: Ambulatory Visit | Attending: Family Medicine | Admitting: Family Medicine

## 2023-06-18 DIAGNOSIS — M545 Low back pain, unspecified: Secondary | ICD-10-CM

## 2023-06-18 DIAGNOSIS — F341 Dysthymic disorder: Secondary | ICD-10-CM | POA: Diagnosis not present

## 2023-06-18 DIAGNOSIS — M48061 Spinal stenosis, lumbar region without neurogenic claudication: Secondary | ICD-10-CM | POA: Diagnosis not present

## 2023-06-18 DIAGNOSIS — M25551 Pain in right hip: Secondary | ICD-10-CM

## 2023-06-18 DIAGNOSIS — F419 Anxiety disorder, unspecified: Secondary | ICD-10-CM | POA: Diagnosis not present

## 2023-06-18 DIAGNOSIS — M25559 Pain in unspecified hip: Secondary | ICD-10-CM | POA: Diagnosis not present

## 2023-06-24 ENCOUNTER — Other Ambulatory Visit: Payer: Self-pay | Admitting: Obstetrics and Gynecology

## 2023-06-24 DIAGNOSIS — J0140 Acute pansinusitis, unspecified: Secondary | ICD-10-CM

## 2023-06-24 MED ORDER — AMOXICILLIN 875 MG PO TABS: 875.0000 mg | ORAL_TABLET | Freq: Two times a day (BID) | ORAL | 0 refills | Status: AC

## 2023-06-30 ENCOUNTER — Other Ambulatory Visit: Payer: Self-pay | Admitting: Family Medicine

## 2023-07-01 ENCOUNTER — Other Ambulatory Visit (HOSPITAL_COMMUNITY): Payer: Self-pay

## 2023-07-01 MED ORDER — VILAZODONE HCL 20 MG PO TABS
1.0000 | ORAL_TABLET | Freq: Every day | ORAL | 0 refills | Status: DC
  Filled 2023-07-01: qty 90, 90d supply, fill #0

## 2023-07-02 ENCOUNTER — Encounter: Payer: Commercial Managed Care - PPO | Admitting: Physical Medicine and Rehabilitation

## 2023-07-05 ENCOUNTER — Encounter: Payer: Self-pay | Admitting: Family Medicine

## 2023-07-05 ENCOUNTER — Encounter (HOSPITAL_COMMUNITY): Payer: Self-pay

## 2023-07-05 ENCOUNTER — Other Ambulatory Visit (HOSPITAL_COMMUNITY): Payer: Self-pay

## 2023-07-06 ENCOUNTER — Encounter: Payer: Self-pay | Admitting: Family Medicine

## 2023-07-06 ENCOUNTER — Other Ambulatory Visit: Payer: Self-pay

## 2023-07-06 DIAGNOSIS — N2889 Other specified disorders of kidney and ureter: Secondary | ICD-10-CM

## 2023-07-06 DIAGNOSIS — M5416 Radiculopathy, lumbar region: Secondary | ICD-10-CM

## 2023-07-06 NOTE — Progress Notes (Signed)
 [place] ordered

## 2023-07-08 ENCOUNTER — Encounter: Payer: Self-pay | Admitting: Family Medicine

## 2023-07-09 NOTE — Discharge Instructions (Signed)

## 2023-07-12 ENCOUNTER — Encounter: Payer: Self-pay | Admitting: Family Medicine

## 2023-07-12 ENCOUNTER — Ambulatory Visit
Admission: RE | Admit: 2023-07-12 | Discharge: 2023-07-12 | Disposition: A | Discharge status: Home / Self Care | Source: Ambulatory Visit | Attending: Family Medicine | Admitting: Family Medicine

## 2023-07-12 DIAGNOSIS — N2889 Other specified disorders of kidney and ureter: Secondary | ICD-10-CM

## 2023-07-12 DIAGNOSIS — N289 Disorder of kidney and ureter, unspecified: Secondary | ICD-10-CM | POA: Diagnosis not present

## 2023-07-12 DIAGNOSIS — M5416 Radiculopathy, lumbar region: Secondary | ICD-10-CM

## 2023-07-12 DIAGNOSIS — M4727 Other spondylosis with radiculopathy, lumbosacral region: Secondary | ICD-10-CM | POA: Diagnosis not present

## 2023-07-12 MED ORDER — METHYLPREDNISOLONE ACETATE 40 MG/ML INJ SUSP (RADIOLOG
80.0000 mg | Freq: Once | INTRAMUSCULAR | Status: AC
  Administered 2023-07-12: 80 mg via EPIDURAL

## 2023-07-12 MED ORDER — IOPAMIDOL (ISOVUE-M 200) INJECTION 41%
1.0000 mL | Freq: Once | INTRAMUSCULAR | Status: AC
  Administered 2023-07-12: 1 mL via EPIDURAL

## 2023-07-17 ENCOUNTER — Other Ambulatory Visit: Payer: Self-pay | Admitting: Internal Medicine

## 2023-07-19 ENCOUNTER — Other Ambulatory Visit (HOSPITAL_COMMUNITY): Payer: Self-pay

## 2023-07-19 ENCOUNTER — Encounter: Payer: Self-pay | Admitting: Family Medicine

## 2023-07-19 MED ORDER — TRAMADOL HCL 50 MG PO TABS
50.0000 mg | ORAL_TABLET | Freq: Two times a day (BID) | ORAL | 0 refills | Status: DC | PRN
  Filled 2023-07-19: qty 30, 15d supply, fill #0

## 2023-07-20 ENCOUNTER — Other Ambulatory Visit: Payer: Self-pay

## 2023-07-20 DIAGNOSIS — Q6102 Congenital multiple renal cysts: Secondary | ICD-10-CM

## 2023-07-21 NOTE — Progress Notes (Unsigned)
 [facility] [address] [place] [address] Phone: [phone] Subjective:   [name] am a scribe for Dr. Felipe Horton.   I'm seeing this patient by the request  of:  Colene Dauphin, MD  CC: Back and neck pain follow-up  [id number]  [name] is a 48 y.o. female coming in with complaint of back and neck pain. OMT on 05/28/2023. Patient states back is a little bit better after epidural. Neck and upper back are about the same.   Medications patient has been prescribed: Viibyrd, Synthroid   Taking:yes      Patient is scheduled for a CT abdomen pelvis with contrast.  This is going to be done on May 27.  Multiple renal cysts noted on ultrasound.   Reviewed prior external information including notes and imaging from previsou exam, outside providers and external EMR if available.   As well as notes that were available from care everywhere and other healthcare systems.  Past medical history, social, surgical and family history all reviewed in electronic medical record.  No pertanent information unless stated regarding to the chief complaint.   Past Medical History:  Diagnosis Date   Allergy    Autoimmune disorder (HCC)    Crohn's disease (HCC)    Endometriosis    Hypertension    Kidney, medullary sponge    Ovarian cyst    Pre-eclampsia    Raynaud's disease    Stress incontinence in female     Allergies  Allergen Reactions   Lamisil [Terbinafine] Hives and Itching    In pill form     Review of Systems:  No headache, visual changes, nausea, vomiting, diarrhea, constipation, dizziness, abdominal pain, skin rash, fevers, chills, night sweats, weight loss, swollen lymph nodes, body aches, joint swelling, chest pain, shortness of breath, mood changes. POSITIVE muscle aches  Objective  Blood pressure 104/80, pulse 83, height 5\' 3"  (1.6 m), weight 141 lb 6.4 oz (64.1 kg), SpO2 100%.   General: No apparent distress alert  and oriented x3 mood and affect normal, dressed appropriately.  HEENT: Pupils equal, extraocular movements intact  Respiratory: Patient's speak in full sentences and does not appear short of breath  Cardiovascular: No lower extremity edema, non tender, no erythema  Gait relatively normal MSK:  Back from some loss lordosis noted.  Does have some tenderness to palpation diffusely.  Neck exam does have some tightness noted with Neer and Hawkins.  Osteopathic findings  C3 flexed rotated and side bent right C7 flexed rotated and side bent left T3 extended rotated and side bent right inhaled rib T7 extended rotated and side bent left L2 flexed rotated and side bent right Sacrum right on right       Assessment and Plan:  No problem-specific Assessment & Plan notes found for this encounter.    Nonallopathic problems  Decision today to treat with OMT was based on Physical Exam  After verbal consent patient was treated with HVLA, ME, FPR techniques in cervical, rib, thoracic, lumbar, and sacral  areas  Patient tolerated the procedure well with improvement in symptoms  Patient given exercises, stretches and lifestyle modifications  See medications in patient instructions if given  Patient will follow up in 4-8 weeks    The above documentation has been reviewed and is accurate and complete Lorraine Reim M Darrold Bezek, DO          Note: This dictation was prepared with Dragon dictation along with smaller phrase technology. Any transcriptional  errors that result from this process are unintentional.

## 2023-07-23 ENCOUNTER — Encounter: Payer: Self-pay | Admitting: Family Medicine

## 2023-07-23 ENCOUNTER — Ambulatory Visit: Admitting: Family Medicine

## 2023-07-23 VITALS — BP 104/80 | HR 83 | Ht 63.0 in | Wt 141.4 lb

## 2023-07-23 DIAGNOSIS — M9908 Segmental and somatic dysfunction of rib cage: Secondary | ICD-10-CM | POA: Diagnosis not present

## 2023-07-23 DIAGNOSIS — M533 Sacrococcygeal disorders, not elsewhere classified: Secondary | ICD-10-CM

## 2023-07-23 DIAGNOSIS — M9902 Segmental and somatic dysfunction of thoracic region: Secondary | ICD-10-CM | POA: Diagnosis not present

## 2023-07-23 DIAGNOSIS — M9904 Segmental and somatic dysfunction of sacral region: Secondary | ICD-10-CM

## 2023-07-23 DIAGNOSIS — M9903 Segmental and somatic dysfunction of lumbar region: Secondary | ICD-10-CM

## 2023-07-23 DIAGNOSIS — M9901 Segmental and somatic dysfunction of cervical region: Secondary | ICD-10-CM | POA: Diagnosis not present

## 2023-07-23 NOTE — Assessment & Plan Note (Signed)
 Recently lost her father but seems to be in relatively good space.Aaron Aas  Spot trying to find time for herself.  Is doing some different books on tape that I think have been already beneficial.  Discussed icing regimen and home exercises, discussed which activities to do and which ones to avoid.  Increase activity slowly otherwise.  Follow-up with me again in 6 to 8 weeks.

## 2023-07-23 NOTE — Patient Instructions (Addendum)
 Good to see you! [email or web address] [facility]. See me again in 6 to 8 weeks.

## 2023-07-29 ENCOUNTER — Other Ambulatory Visit (HOSPITAL_COMMUNITY): Payer: Self-pay

## 2023-07-29 ENCOUNTER — Encounter: Payer: Self-pay | Admitting: Internal Medicine

## 2023-07-29 NOTE — Patient Instructions (Addendum)
 Blood work was ordered.       Medications changes include :   None      Return in about 6 months (around 01/30/2024) for follow up.   Health Maintenance, Female Adopting a healthy lifestyle and getting preventive care are important in promoting health and wellness. Ask your health care provider about: The right schedule for you to have regular tests and exams. Things you can do on your own to prevent diseases and keep yourself healthy. What should I know about diet, weight, and exercise? Eat a healthy diet  Eat a diet that includes plenty of vegetables, fruits, low-fat dairy products, and lean protein. Do not eat a lot of foods that are high in solid fats, added sugars, or sodium. Maintain a healthy weight Body mass index (BMI) is used to identify weight problems. It estimates body fat based on height and weight. Your health care provider can help determine your BMI and help you achieve or maintain a healthy weight. Get regular exercise Get regular exercise. This is one of the most important things you can do for your health. Most adults should: Exercise for at least 150 minutes each week. The exercise should increase your heart rate and make you sweat (moderate-intensity exercise). Do strengthening exercises at least twice a week. This is in addition to the moderate-intensity exercise. Spend less time sitting. Even light physical activity can be beneficial. Watch cholesterol and blood lipids Have your blood tested for lipids and cholesterol at 50 years of age, then have this test every 5 years. Have your cholesterol levels checked more often if: Your lipid or cholesterol levels are high. You are older than 50 years of age. You are at high risk for heart disease. What should I know about cancer screening? Depending on your health history and family history, you may need to have cancer screening at various ages. This may include screening for: Breast cancer. Cervical  cancer. Colorectal cancer. Skin cancer. Lung cancer. What should I know about heart disease, diabetes, and high blood pressure? Blood pressure and heart disease High blood pressure causes heart disease and increases the risk of stroke. This is more likely to develop in people who have high blood pressure readings or are overweight. Have your blood pressure checked: Every 3-5 years if you are 37-7 years of age. Every year if you are 34 years old or older. Diabetes Have regular diabetes screenings. This checks your fasting blood sugar level. Have the screening done: Once every three years after age 62 if you are at a normal weight and have a low risk for diabetes. More often and at a younger age if you are overweight or have a high risk for diabetes. What should I know about preventing infection? Hepatitis B If you have a higher risk for hepatitis B, you should be screened for this virus. Talk with your health care provider to find out if you are at risk for hepatitis B infection. Hepatitis C Testing is recommended for: Everyone born from [birth year] through 1967. Anyone with known risk factors for hepatitis C. Sexually transmitted infections (STIs) Get screened for STIs, including gonorrhea and chlamydia, if: You are sexually active and are younger than 50 years of age. You are older than 50 years of age and your health care provider tells you that you are at risk for this type of infection. Your sexual activity has changed since you were last screened, and you are at increased risk for chlamydia or  gonorrhea. Ask your health care provider if you are at risk. Ask your health care provider about whether you are at high risk for HIV. Your health care provider may recommend a prescription medicine to help prevent HIV infection. If you choose to take medicine to prevent HIV, you should first get tested for HIV. You should then be tested every 3 months for as long as you are taking the  medicine. Pregnancy If you are about to stop having your period (premenopausal) and you may become pregnant, seek counseling before you get pregnant. Take 400 to 800 micrograms (mcg) of folic acid every day if you become pregnant. Ask for birth control (contraception) if you want to prevent pregnancy. Osteoporosis and menopause Osteoporosis is a disease in which the bones lose minerals and strength with aging. This can result in bone fractures. If you are 63 years old or older, or if you are at risk for osteoporosis and fractures, ask your health care provider if you should: Be screened for bone loss. Take a calcium or vitamin D  supplement to lower your risk of fractures. Be given hormone replacement therapy (HRT) to treat symptoms of menopause. Follow these instructions at home: Alcohol use Do not drink alcohol if: Your health care provider tells you not to drink. You are pregnant, may be pregnant, or are planning to become pregnant. If you drink alcohol: Limit how much you have to: 0-1 drink a day. Know how much alcohol is in your drink. In the [place] one drink equals one 12 oz bottle of beer (355 mL), one 5 oz glass of wine (148 mL), or one 1 oz glass of hard liquor (44 mL). Lifestyle Do not use any products that contain nicotine or tobacco. These products include cigarettes, chewing tobacco, and vaping devices, such as e-cigarettes. If you need help quitting, ask your health care provider. Do not use street drugs. Do not share needles. Ask your health care provider for help if you need support or information about quitting drugs. General instructions Schedule regular health, dental, and eye exams. Stay current with your vaccines. Tell your health care provider if: You often feel depressed. You have ever been abused or do not feel safe at home. Summary Adopting a healthy lifestyle and getting preventive care are important in promoting health and wellness. Follow your health care  provider's instructions about healthy diet, exercising, and getting tested or screened for diseases. Follow your health care provider's instructions on monitoring your cholesterol and blood pressure. This information is not intended to replace advice given to you by your health care provider. Make sure you discuss any questions you have with your health care provider. Document Revised: 07/15/2020 Document Reviewed: 07/15/2020 Elsevier Patient Education  2024 [employer].

## 2023-07-29 NOTE — Progress Notes (Signed)
 Subjective:    Patient ID: [name], female    DOB: [birth year], 48 y.o.   MRN: [id number]      HPI [name] is here for a Physical exam and her chronic medical problems.    Her father died recently of a heart attack   Medications and allergies reviewed with patient and updated if appropriate.  Current Outpatient Medications on File Prior to Visit  Medication Sig Dispense Refill   Acetaminophen -Caffeine (EXCEDRIN TENSION HEADACHE) 500-65 MG TABS Take 2 tablets by mouth daily as needed (headache).     amLODipine  (NORVASC ) 5 MG tablet Take 1 tablet (5 mg total) by mouth daily. 90 tablet 1   Cholecalciferol (VITAMIN D3) 2000 units TABS Take 2,000 Units by mouth daily.     docusate sodium (COLACE) 100 MG capsule Take 100 mg by mouth daily.     DOTTI  0.05 MG/24HR patch Place 1 patch (0.05 mg total) onto the skin 2 (two) times a week. 24 patch 3   gabapentin  (NEURONTIN ) 100 MG capsule Take 2 capsules (200 mg total) by mouth 2 (two) times daily. To take with Lyrica . 360 capsule 1   levothyroxine  (SYNTHROID ) 75 MCG tablet Take 1 tablet (75 mcg total) by mouth daily before breakfast. 90 tablet 0   nitrofurantoin  (MACRODANTIN ) 50 MG capsule Take as directed after intercourse 45 capsule 0   omeprazole (PRILOSEC) 20 MG capsule Take 20 mg by mouth daily.     pregabalin  (LYRICA ) 150 MG capsule Take 1 capsule (150 mg total) by mouth 2 (two) times daily. 60 capsule 5   progesterone  (PROMETRIUM ) 100 MG capsule Take 1 capsule (100 mg total) by mouth at bedtime. 90 capsule 3   Propylene Glycol (SYSTANE COMPLETE OP) Place 1 drop into both eyes daily.     Vilazodone  HCl (VIIBRYD ) 20 MG TABS Take 1 tablet (20 mg total) by mouth daily. 90 tablet 0   multivitamin-lutein (OCUVITE-LUTEIN) CAPS capsule Take 1 capsule by mouth daily. (Patient not taking: Reported on 07/30/2023)     tiZANidine  (ZANAFLEX ) 4 MG tablet Take 1 tablet (4 mg total) by mouth at bedtime. (Patient not taking: Reported on  07/30/2023) 30 tablet 0   [DISCONTINUED] estradiol -levonorgestrel  (CLIMARA  PRO) 0.045-0.015 MG/DAY Place 1 patch onto the skin once a week. 12 patch 0   [DISCONTINUED] estradiol -norethindrone  (COMBIPATCH ) 0.05-0.25 MG/DAY Place 1 patch onto the skin 2 (two) times a week. 24 patch 3   No current facility-administered medications on file prior to visit.    Review of Systems  Constitutional:  Negative for fever.  Eyes:  Negative for visual disturbance.  Respiratory:  Negative for cough, shortness of breath and wheezing.   Cardiovascular:  Positive for leg swelling (occ). Negative for chest pain and palpitations.  Gastrointestinal:  Positive for constipation (intermittent). Negative for abdominal pain, blood in stool and diarrhea.       No gerd  Genitourinary:  Negative for dysuria.  Musculoskeletal:  Positive for back pain (chronic R SI joint, upper back) and myalgias. Negative for arthralgias.  Skin:  Negative for rash.  Neurological:  Positive for headaches (occ). Negative for light-headedness.  Psychiatric/Behavioral:  Positive for sleep disturbance. Negative for dysphoric mood. The patient is nervous/anxious (increased recently).        Objective:   Vitals:   07/30/23 1419  BP: 110/74  Pulse: 76  Temp: 98.5 F (36.9 C)  SpO2: 99%   Filed Weights   07/30/23 1419  Weight: 141 lb 9.6 oz (64.2 kg)  Body mass index is 25.08 kg/m.  BP Readings from Last 3 Encounters:  07/30/23 110/74  07/23/23 104/80  07/12/23 (!) 142/87    Wt Readings from Last 3 Encounters:  07/30/23 141 lb 9.6 oz (64.2 kg)  07/23/23 141 lb 6.4 oz (64.1 kg)  04/16/23 148 lb (67.1 kg)       Physical Exam Constitutional: She appears well-developed and well-nourished. No distress.  HENT:  Head: Normocephalic and atraumatic.  Right Ear: External ear normal. Normal ear canal and TM Left Ear: External ear normal.  Normal ear canal and TM Mouth/Throat: Oropharynx is clear and moist.  Eyes:  Conjunctivae normal.  Neck: Neck supple. No tracheal deviation present. No thyromegaly present.  No carotid bruit  Cardiovascular: Normal rate, regular rhythm and normal heart sounds.   No murmur heard.  No edema. Pulmonary/Chest: Effort normal and breath sounds normal. No respiratory distress. She has no wheezes. She has no rales.  Breast: deferred   Abdominal: Soft. She exhibits no distension. There is no tenderness.  Lymphadenopathy: She has no cervical adenopathy.  Skin: Skin is warm and dry. She is not diaphoretic.  Psychiatric: She has a normal mood and affect. Her behavior is normal.     Lab Results  Component Value Date   WBC 11.5 (H) 06/19/2022   HGB 13.6 06/19/2022   HCT 41.3 06/19/2022   PLT 322.0 06/19/2022   GLUCOSE 96 01/22/2023   CHOL 184 06/19/2022   TRIG 137.0 06/19/2022   HDL 56.70 06/19/2022   LDLCALC 100 (H) 06/19/2022   ALT 12 01/22/2023   AST 14 01/22/2023   NA 140 01/22/2023   K 3.6 01/22/2023   CL 102 01/22/2023   CREATININE 1.05 01/22/2023   BUN 18 01/22/2023   CO2 31 01/22/2023   TSH 1.38 01/22/2023   HGBA1C 5.6 01/22/2023         Assessment & Plan:   Physical exam: Screening blood work  ordered Exercise  walking at lunch sometimes Weight  normal  - has lost weight -- doing some intermittent fasting Substance abuse  none   Reviewed recommended immunizations.   Health Maintenance  Topic Date Due   Cervical Cancer Screening (HPV/Pap Cotest)  06/17/2020   COVID-19 Vaccine (4 - 2024-25 season) 11/08/2022   Zoster Vaccines- Shingrix (1 of 2) Never done   DTaP/Tdap/Td (3 - Td or Tdap) 09/12/2023   INFLUENZA VACCINE  10/08/2023   Fecal DNA (Cologuard)  11/09/2023   MAMMOGRAM  01/25/2024   HIV Screening  Completed   HPV VACCINES  Aged Out   Meningococcal B Vaccine  Aged Out   Hepatitis C Screening  Discontinued          See Problem List for Assessment and Plan of chronic medical problems.

## 2023-07-30 ENCOUNTER — Ambulatory Visit (INDEPENDENT_AMBULATORY_CARE_PROVIDER_SITE_OTHER): Payer: Commercial Managed Care - PPO | Admitting: Internal Medicine

## 2023-07-30 ENCOUNTER — Other Ambulatory Visit (HOSPITAL_COMMUNITY): Payer: Self-pay

## 2023-07-30 ENCOUNTER — Encounter: Payer: Self-pay | Admitting: Internal Medicine

## 2023-07-30 ENCOUNTER — Encounter: Payer: Self-pay | Admitting: Family Medicine

## 2023-07-30 VITALS — BP 110/74 | HR 76 | Temp 98.5°F | Ht 63.0 in | Wt 141.6 lb

## 2023-07-30 DIAGNOSIS — R7303 Prediabetes: Secondary | ICD-10-CM | POA: Diagnosis not present

## 2023-07-30 DIAGNOSIS — K219 Gastro-esophageal reflux disease without esophagitis: Secondary | ICD-10-CM | POA: Diagnosis not present

## 2023-07-30 DIAGNOSIS — E039 Hypothyroidism, unspecified: Secondary | ICD-10-CM

## 2023-07-30 DIAGNOSIS — I10 Essential (primary) hypertension: Secondary | ICD-10-CM

## 2023-07-30 DIAGNOSIS — M7918 Myalgia, other site: Secondary | ICD-10-CM | POA: Diagnosis not present

## 2023-07-30 DIAGNOSIS — Z23 Encounter for immunization: Secondary | ICD-10-CM | POA: Diagnosis not present

## 2023-07-30 DIAGNOSIS — N390 Urinary tract infection, site not specified: Secondary | ICD-10-CM | POA: Diagnosis not present

## 2023-07-30 DIAGNOSIS — Z0000 Encounter for general adult medical examination without abnormal findings: Secondary | ICD-10-CM | POA: Diagnosis not present

## 2023-07-30 LAB — CBC WITH DIFFERENTIAL/PLATELET
Basophils Absolute: 0.1 10*3/uL (ref 0.0–0.1)
Basophils Relative: 1 % (ref 0.0–3.0)
Eosinophils Absolute: 0.4 10*3/uL (ref 0.0–0.7)
Eosinophils Relative: 3.6 % (ref 0.0–5.0)
HCT: 42.6 % (ref 36.0–46.0)
Hemoglobin: 14.3 g/dL (ref 12.0–15.0)
Lymphocytes Relative: 41.7 % (ref 12.0–46.0)
Lymphs Abs: 4.1 10*3/uL — ABNORMAL HIGH (ref 0.7–4.0)
MCHC: 33.6 g/dL (ref 30.0–36.0)
MCV: 91.9 fl (ref 78.0–100.0)
Monocytes Absolute: 0.6 10*3/uL (ref 0.1–1.0)
Monocytes Relative: 5.8 % (ref 3.0–12.0)
Neutro Abs: 4.7 10*3/uL (ref 1.4–7.7)
Neutrophils Relative %: 47.9 % (ref 43.0–77.0)
Platelets: 278 10*3/uL (ref 150.0–400.0)
RBC: 4.64 Mil/uL (ref 3.87–5.11)
RDW: 13.1 % (ref 11.5–15.5)
WBC: 9.9 10*3/uL (ref 4.0–10.5)

## 2023-07-30 LAB — LIPID PANEL
Cholesterol: 224 mg/dL — ABNORMAL HIGH (ref 0–200)
HDL: 64.9 mg/dL (ref >39.00)
LDL Cholesterol: 139 mg/dL — ABNORMAL HIGH (ref 0–99)
NonHDL: 158.99
Total CHOL/HDL Ratio: 3
Triglycerides: 101 mg/dL (ref 0.0–149.0)
VLDL: 20.2 mg/dL (ref 0.0–40.0)

## 2023-07-30 LAB — COMPREHENSIVE METABOLIC PANEL WITH GFR
ALT: 18 U/L (ref 0–35)
AST: 16 U/L (ref 0–37)
Albumin: 4.6 g/dL (ref 3.5–5.2)
Alkaline Phosphatase: 99 U/L (ref 39–117)
BUN: 18 mg/dL (ref 6–23)
CO2: 30 meq/L (ref 19–32)
Calcium: 9.7 mg/dL (ref 8.4–10.5)
Chloride: 102 meq/L (ref 96–112)
Creatinine, Ser: 1.07 mg/dL (ref 0.40–1.20)
GFR: 60.72 mL/min (ref >60.00)
Glucose, Bld: 93 mg/dL (ref 70–99)
Potassium: 3.8 meq/L (ref 3.5–5.1)
Sodium: 140 meq/L (ref 135–145)
Total Bilirubin: 0.5 mg/dL (ref 0.2–1.2)
Total Protein: 7.3 g/dL (ref 6.0–8.3)

## 2023-07-30 LAB — HEMOGLOBIN A1C: Hgb A1c MFr Bld: 5.8 % (ref 4.6–6.5)

## 2023-07-30 LAB — TSH: TSH: 0.94 u[IU]/mL (ref 0.35–5.50)

## 2023-07-30 MED ORDER — TRAMADOL HCL 50 MG PO TABS
50.0000 mg | ORAL_TABLET | Freq: Two times a day (BID) | ORAL | 0 refills | Status: DC | PRN
Start: 2023-07-30 — End: 2023-11-28
  Filled 2023-07-30: qty 30, 15d supply, fill #0

## 2023-07-30 NOTE — Assessment & Plan Note (Signed)
Chronic °Blood pressure well controlled °CMP °Continue amlodipine 5 mg daily °

## 2023-07-30 NOTE — Assessment & Plan Note (Signed)
 Chronic  euthyroid Check tsh  Currently taking levothyroxine  50 mcg daily-managed by Dr. Felipe Horton

## 2023-07-30 NOTE — Assessment & Plan Note (Addendum)
 Chronic Myofascial pain and fibromyalgia Trigger point injections have not helped Following with Dr Felipe Horton and Dr lovorn Taking lyrica , tizanidine  4 mg at bed prn, gabapentin  and tramadol  as needed which is not often Continue tramadol  50 mg daily prn

## 2023-07-30 NOTE — Assessment & Plan Note (Signed)
Chronic GERD controlled Continue omeprazole 20 mg daily  

## 2023-07-30 NOTE — Assessment & Plan Note (Signed)
 Chronic Lab Results  Component Value Date   HGBA1C 5.6 01/22/2023   Check a1c Low sugar / carb diet Stressed regular exercise

## 2023-07-30 NOTE — Assessment & Plan Note (Signed)
Chronic Has medullary sponge kidney disease Frequent UTIs Continue nitrofurantoin 50 mg after intercourse for prevention 

## 2023-08-01 ENCOUNTER — Ambulatory Visit: Payer: Self-pay | Admitting: Internal Medicine

## 2023-08-03 ENCOUNTER — Ambulatory Visit
Admission: RE | Admit: 2023-08-03 | Discharge: 2023-08-03 | Disposition: A | Discharge status: Home / Self Care | Source: Ambulatory Visit | Attending: Family Medicine | Admitting: Family Medicine

## 2023-08-03 DIAGNOSIS — N281 Cyst of kidney, acquired: Secondary | ICD-10-CM | POA: Diagnosis not present

## 2023-08-03 DIAGNOSIS — Q6102 Congenital multiple renal cysts: Secondary | ICD-10-CM

## 2023-08-03 DIAGNOSIS — N200 Calculus of kidney: Secondary | ICD-10-CM | POA: Diagnosis not present

## 2023-08-03 MED ORDER — IOPAMIDOL (ISOVUE-300) INJECTION 61%
100.0000 mL | Freq: Once | INTRAVENOUS | Status: AC | PRN
  Administered 2023-08-03: 100 mL via INTRAVENOUS

## 2023-08-06 ENCOUNTER — Other Ambulatory Visit (HOSPITAL_COMMUNITY): Payer: Self-pay

## 2023-08-09 ENCOUNTER — Other Ambulatory Visit: Payer: Self-pay

## 2023-08-09 ENCOUNTER — Ambulatory Visit: Payer: Self-pay | Admitting: Family Medicine

## 2023-08-09 DIAGNOSIS — N200 Calculus of kidney: Secondary | ICD-10-CM

## 2023-08-09 DIAGNOSIS — M545 Low back pain, unspecified: Secondary | ICD-10-CM | POA: Diagnosis not present

## 2023-08-09 DIAGNOSIS — M797 Fibromyalgia: Secondary | ICD-10-CM | POA: Diagnosis not present

## 2023-08-09 DIAGNOSIS — M25512 Pain in left shoulder: Secondary | ICD-10-CM | POA: Diagnosis not present

## 2023-08-23 DIAGNOSIS — M797 Fibromyalgia: Secondary | ICD-10-CM | POA: Diagnosis not present

## 2023-08-23 DIAGNOSIS — M545 Low back pain, unspecified: Secondary | ICD-10-CM | POA: Diagnosis not present

## 2023-08-23 DIAGNOSIS — M25512 Pain in left shoulder: Secondary | ICD-10-CM | POA: Diagnosis not present

## 2023-09-06 DIAGNOSIS — M797 Fibromyalgia: Secondary | ICD-10-CM | POA: Diagnosis not present

## 2023-09-06 DIAGNOSIS — M25512 Pain in left shoulder: Secondary | ICD-10-CM | POA: Diagnosis not present

## 2023-09-06 DIAGNOSIS — M545 Low back pain, unspecified: Secondary | ICD-10-CM | POA: Diagnosis not present

## 2023-09-06 NOTE — Progress Notes (Unsigned)
 [name] [address] [place] [address] Phone: [phone] Subjective:   [name], am serving as a scribe for Dr. Arthea Claudene.  I'm seeing this patient by the request  of:  Geofm Glade PARAS, MD  CC: Back and neck pain  YEP:Dlagzrupcz  [name] is a 48 y.o. female coming in with complaint of back and neck pain. OMT 07/23/2023. Patient states that she continues to have back pain in B SI joints and lower lumbar spine. Denies any radiating symptoms.   R sided scapular pain that sometimes radiates into the R arm.  Still waiting to see nephrology as they won't schedule patient at this time as they did not deem her diagnosis as urgent.   Medications patient has been prescribed: Viibryd   Taking:         Reviewed prior external information including notes and imaging from previsou exam, outside providers and external EMR if available.   As well as notes that were available from care everywhere and other healthcare systems.  Past medical history, social, surgical and family history all reviewed in electronic medical record.  No pertanent information unless stated regarding to the chief complaint.   Past Medical History:  Diagnosis Date   Allergy    Autoimmune disorder (HCC)    Crohn's disease (HCC)    Endometriosis    Hypertension    Kidney, medullary sponge    Ovarian cyst    Pre-eclampsia    Raynaud's disease    Stress incontinence in female     Allergies  Allergen Reactions   Lamisil [Terbinafine] Hives and Itching    In pill form     Review of Systems:  No , visual changes, nausea, vomiting, diarrhea, constipation, dizziness, abdominal pain, skin rash, fevers, chills, night sweats, weight loss, swollen lymph nodes, body aches, joint swelling, chest pain, shortness of breath, mood changes. POSITIVE muscle aches, headache  Objective  Blood pressure 122/82, height 5' 3 (1.6 m), weight 146 lb (66.2 kg).    General: No apparent distress alert and oriented x3 mood and affect normal, dressed appropriately.  HEENT: Pupils equal, extraocular movements intact  Respiratory: Patient's speak in full sentences and does not appear short of breath  Cardiovascular: No lower extremity edema, non tender, no erythema  Gait MSK:  Back pain in the paraspinal musculature of the lumbar spine.  Tightness with Deri right greater than left.  Negative straight leg test noted today.  Patient does have pain over the sacroiliac joint.  Osteopathic findings  C2 flexed rotated and side bent right C6 flexed rotated and side bent left T3 extended rotated and side bent right inhaled rib T7 extended rotated and side bent right L4 flexed rotated and side bent right Sacrum right on right       Assessment and Plan:  SI (sacroiliac) joint dysfunction Continues to have significant discomfort.  Continues to have instability of the area.  Is going to be back into her routine now that she is back at this moment.  Discussed icing regimen of home exercises, increase activity slowly.  Follow-up again in 6 to 8 weeks otherwise.    Nonallopathic problems  Decision today to treat with OMT was based on Physical Exam  After verbal consent patient was treated with HVLA, ME, FPR techniques in cervical, rib, thoracic, lumbar, and sacral  areas  Patient tolerated the procedure well with improvement in symptoms  Patient given exercises, stretches and lifestyle modifications  See  medications in patient instructions if given  Patient will follow up in 4-8 weeks     The above documentation has been reviewed and is accurate and complete Arthea CHRISTELLA Sharps, DO         Note: This dictation was prepared with Dragon dictation along with smaller phrase technology. Any transcriptional errors that result from this process are unintentional.

## 2023-09-07 ENCOUNTER — Encounter: Payer: Self-pay | Admitting: Family Medicine

## 2023-09-07 ENCOUNTER — Ambulatory Visit: Admitting: Family Medicine

## 2023-09-07 VITALS — BP 122/82 | Ht 63.0 in | Wt 146.0 lb

## 2023-09-07 DIAGNOSIS — M9904 Segmental and somatic dysfunction of sacral region: Secondary | ICD-10-CM

## 2023-09-07 DIAGNOSIS — M9903 Segmental and somatic dysfunction of lumbar region: Secondary | ICD-10-CM

## 2023-09-07 DIAGNOSIS — M533 Sacrococcygeal disorders, not elsewhere classified: Secondary | ICD-10-CM | POA: Diagnosis not present

## 2023-09-07 DIAGNOSIS — M9902 Segmental and somatic dysfunction of thoracic region: Secondary | ICD-10-CM

## 2023-09-07 DIAGNOSIS — M9908 Segmental and somatic dysfunction of rib cage: Secondary | ICD-10-CM

## 2023-09-07 DIAGNOSIS — M9901 Segmental and somatic dysfunction of cervical region: Secondary | ICD-10-CM

## 2023-09-07 NOTE — Patient Instructions (Signed)
 You are in a good place Have fun on vacation See me in 8 weeks

## 2023-09-07 NOTE — Assessment & Plan Note (Signed)
 Continues to have significant discomfort.  Continues to have instability of the area.  Is going to be back into her routine now that she is back at this moment.  Discussed icing regimen of home exercises, increase activity slowly.  Follow-up again in 6 to 8 weeks otherwise.

## 2023-09-08 ENCOUNTER — Other Ambulatory Visit: Payer: Self-pay | Admitting: Physical Medicine and Rehabilitation

## 2023-09-08 ENCOUNTER — Other Ambulatory Visit (HOSPITAL_COMMUNITY): Payer: Self-pay

## 2023-09-08 LAB — HM PAP SMEAR: HM Pap smear: NEGATIVE

## 2023-09-08 MED ORDER — GABAPENTIN 100 MG PO CAPS
200.0000 mg | ORAL_CAPSULE | Freq: Two times a day (BID) | ORAL | 1 refills | Status: DC
  Filled 2023-09-08 – 2024-01-25 (×4): qty 360, 90d supply, fill #0

## 2023-09-16 DIAGNOSIS — F341 Dysthymic disorder: Secondary | ICD-10-CM | POA: Diagnosis not present

## 2023-09-20 ENCOUNTER — Other Ambulatory Visit (HOSPITAL_COMMUNITY): Payer: Self-pay

## 2023-09-20 ENCOUNTER — Other Ambulatory Visit: Payer: Self-pay | Admitting: Internal Medicine

## 2023-09-20 DIAGNOSIS — M797 Fibromyalgia: Secondary | ICD-10-CM | POA: Diagnosis not present

## 2023-09-20 DIAGNOSIS — M25512 Pain in left shoulder: Secondary | ICD-10-CM | POA: Diagnosis not present

## 2023-09-20 DIAGNOSIS — M545 Low back pain, unspecified: Secondary | ICD-10-CM | POA: Diagnosis not present

## 2023-09-20 MED ORDER — LEVOTHYROXINE SODIUM 75 MCG PO TABS
75.0000 ug | ORAL_TABLET | Freq: Every day | ORAL | 0 refills | Status: DC
  Filled 2023-09-20: qty 90, 90d supply, fill #0

## 2023-09-21 ENCOUNTER — Other Ambulatory Visit (HOSPITAL_COMMUNITY): Payer: Self-pay

## 2023-09-22 ENCOUNTER — Other Ambulatory Visit (HOSPITAL_COMMUNITY): Payer: Self-pay

## 2023-09-28 ENCOUNTER — Other Ambulatory Visit (HOSPITAL_COMMUNITY): Payer: Self-pay

## 2023-09-30 ENCOUNTER — Other Ambulatory Visit: Payer: Self-pay | Admitting: Family Medicine

## 2023-09-30 ENCOUNTER — Other Ambulatory Visit: Payer: Self-pay | Admitting: Physical Medicine and Rehabilitation

## 2023-09-30 ENCOUNTER — Other Ambulatory Visit (HOSPITAL_COMMUNITY): Payer: Self-pay

## 2023-10-01 ENCOUNTER — Other Ambulatory Visit (HOSPITAL_COMMUNITY): Payer: Self-pay

## 2023-10-01 ENCOUNTER — Other Ambulatory Visit: Payer: Self-pay

## 2023-10-01 DIAGNOSIS — F419 Anxiety disorder, unspecified: Secondary | ICD-10-CM | POA: Diagnosis not present

## 2023-10-01 DIAGNOSIS — F341 Dysthymic disorder: Secondary | ICD-10-CM | POA: Diagnosis not present

## 2023-10-01 MED ORDER — PREGABALIN 150 MG PO CAPS
150.0000 mg | ORAL_CAPSULE | Freq: Two times a day (BID) | ORAL | 5 refills | Status: DC
  Filled 2023-10-01: qty 60, 30d supply, fill #0
  Filled 2023-11-01: qty 60, 30d supply, fill #1
  Filled 2023-11-28 – 2023-12-03 (×2): qty 60, 30d supply, fill #2
  Filled 2024-01-05 – 2024-01-07 (×2): qty 60, 30d supply, fill #3
  Filled 2024-01-14 – 2024-02-09 (×2): qty 60, 30d supply, fill #0
  Filled 2024-03-04: qty 60, 30d supply, fill #1

## 2023-10-01 MED ORDER — VILAZODONE HCL 20 MG PO TABS
1.0000 | ORAL_TABLET | Freq: Every day | ORAL | 0 refills | Status: DC
  Filled 2023-10-01: qty 90, 90d supply, fill #0

## 2023-10-03 ENCOUNTER — Other Ambulatory Visit: Payer: Self-pay | Admitting: Internal Medicine

## 2023-10-04 ENCOUNTER — Other Ambulatory Visit: Payer: Self-pay

## 2023-10-04 ENCOUNTER — Other Ambulatory Visit (HOSPITAL_COMMUNITY): Payer: Self-pay

## 2023-10-04 MED ORDER — AMLODIPINE BESYLATE 5 MG PO TABS
5.0000 mg | ORAL_TABLET | Freq: Every day | ORAL | 1 refills | Status: DC
  Filled 2023-10-04: qty 90, 90d supply, fill #0
  Filled 2024-01-05: qty 90, 90d supply, fill #1

## 2023-10-08 ENCOUNTER — Encounter: Attending: Physical Medicine and Rehabilitation | Admitting: Physical Medicine and Rehabilitation

## 2023-10-08 ENCOUNTER — Encounter: Payer: Self-pay | Admitting: Physical Medicine and Rehabilitation

## 2023-10-08 VITALS — BP 128/88 | HR 73 | Ht 62.0 in | Wt 144.0 lb

## 2023-10-08 DIAGNOSIS — M5416 Radiculopathy, lumbar region: Secondary | ICD-10-CM | POA: Diagnosis not present

## 2023-10-08 DIAGNOSIS — M533 Sacrococcygeal disorders, not elsewhere classified: Secondary | ICD-10-CM | POA: Insufficient documentation

## 2023-10-08 DIAGNOSIS — M7918 Myalgia, other site: Secondary | ICD-10-CM | POA: Diagnosis not present

## 2023-10-08 NOTE — Progress Notes (Signed)
 Subjective:    Patient ID: [name], female    DOB: [birth year], 48 y.o.   MRN: [id number]  HPI  Pt is a 48 yr old female with dx of Fibromyalgia, HTN, and hypothyroidism- last TSH 1.07;  Also has hx of anxiety-  Has medullar sponge kidney.  Here for evaluation of fibromyalgia- got dx 10/21- but Sx's since 2017. Also has myofascial pain syndrome- Here for f/u on Fibromyalgia and to get Trigger point injections.   Dr Claudene- DO/OMT doesn't think pt has Fibromyalgia per his note.   Got MRI of    Did autoimmune reset-  Dr Rosalva- very expensive scam- online platform- that pays for- for gut reset-  Trying to reset microbiome- and to get rid of potential allergens- likes the diet-   Encourages organ meats and bone broth-  and supplements. Intermittent fasting- 12 hours  Lost 15 lbs- was feeling somewhat better- and spiraled after dad passed from MI.  Easier eating patterns- not having to prep food-  has program materials- -  mind body connection things as well.   Listening to Nat Rainwater- LCSW- mind your body.  Meditation and journal speak.    Pain has been pretty bad lately- knows secondary to stress at work, grief issues- not sleeping well.   Takes 200 mg Gabapentin  at bedtime to sleep and a Zquil- no melatonin-  waking up around 3-4 am most nights and cannot get back into deep sleep-  just wakes up- sometimes pain- R SI joint.  Sometimes anxiety-  Working with counselor about this.   Got Rx from Dr Geofm- her PCP-for Tramadol  30 tabs- 5/25-  sees her q6 months-  gets Tramadol   every so often from Dr Geofm- goes 3 months between refills. Rarely takes during week- takes more on weekend.  Thinks could use during the week. Thinks would be less distracted from pain- knows she's not impaired on it-  and can drive and not sedated.    Still taking Lyrica -  150 mg 1x/day- had switched to BID for awhile- when takes BID, Libido is severely impaired.   Switched to 1x/day 1 month  ago.     Did a couple Ct scans and MRI due to lack SI- does have polycystic kidneys. Won't give her a ball park when to see them- Is 1 on acuity- 4 is max acuity-  Doesn't want to take NSAIDs because of medullary sponge kidney, and polycystic kidney disease   Did Epidural Steroid injection- with [facility]-  07/2023- was kind of helpful.    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        Pain Inventory Average Pain 5 Pain Right Now 4 My pain is constant and aching  In the last 24 hours, has pain interfered with the following? General activity 4 Relation with others 2 Enjoyment of life 7 What TIME of day is your pain at its worst? morning  Sleep (in general) Poor  Pain is worse with: sitting, standing, and some activites Pain improves with: rest and medication Relief from Meds: 7  Family History  Problem Relation Age of Onset   Breast cancer Mother    Heart disease Father    Hypertension Father    Hyperlipidemia Father    Heart attack Father    Other Brother        lyme-chronic   Stroke Maternal Grandmother    Alcohol abuse Paternal Grandmother    Heart disease Paternal Grandfather    Colon cancer Neg  Hx    Esophageal cancer Neg Hx    Rectal cancer Neg Hx    Social History   Socioeconomic History   Marital status: Married    Spouse name: Not on file   Number of children: 3   Years of education: Not on file   Highest education level: Bachelor's degree (e.g., BA, AB, BS)  Occupational History   Occupation: Teacher, adult education: [employer]  Tobacco Use   Smoking status: Never   Smokeless tobacco: Never  Vaping Use   Vaping status: Never Used  Substance and Sexual Activity   Alcohol use: Yes   Drug use: No   Sexual activity: Yes  Other Topics Concern   Not on file  Social History Narrative   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, OR   Social Drivers of Health   Financial Resource Strain: Low Risk  (01/22/2023)   Overall Financial Resource Strain  (CARDIA)    Difficulty of Paying Living Expenses: Not hard at all  Food Insecurity: No Food Insecurity (01/22/2023)   Hunger Vital Sign    Worried About Running Out of Food in the Last Year: Never true    Ran Out of Food in the Last Year: Never true  Transportation Needs: No Transportation Needs (01/22/2023)   PRAPARE - Administrator, Civil Service (Medical): No    Lack of Transportation (Non-Medical): No  Physical Activity: Insufficiently Active (01/22/2023)   Exercise Vital Sign    Days of Exercise per Week: 1 day    Minutes of Exercise per Session: 10 min  Stress: Stress Concern Present (01/22/2023)   [employer] of Occupational Health - Occupational Stress Questionnaire    Feeling of Stress : To some extent  Social Connections: Moderately Isolated (01/22/2023)   Social Connection and Isolation Panel    Frequency of Communication with Friends and Family: Once a week    Frequency of Social Gatherings with Friends and Family: Once a week    Attends Religious Services: 1 to 4 times per year    Active Member of [employer] or Organizations: No    Attends Engineer, structural: Not on file    Marital Status: Married   Past Surgical History:  Procedure Laterality Date   CHOLECYSTECTOMY     DILATION AND CURETTAGE OF UTERUS     ENDOMETRIAL ABLATION     NOVASURE ABLATION     TUBAL LIGATION     Past Surgical History:  Procedure Laterality Date   CHOLECYSTECTOMY     DILATION AND CURETTAGE OF UTERUS     ENDOMETRIAL ABLATION     NOVASURE ABLATION     TUBAL LIGATION     Past Medical History:  Diagnosis Date   Allergy    Autoimmune disorder (HCC)    Crohn's disease (HCC)    Endometriosis    Hypertension    Kidney, medullary sponge    Ovarian cyst    Pre-eclampsia    Raynaud's disease    Stress incontinence in female    Ht 5' 2 (1.575 m)   Wt 144 lb (65.3 kg)   BMI 26.34 kg/m   Opioid Risk Score:   Fall Risk Score:  `1  Depression screen PHQ  2/9     07/30/2023    2:21 PM 04/02/2023    1:43 PM 01/22/2023    2:32 PM 12/28/2022    3:34 PM 10/27/2022    3:41 PM 06/19/2022    1:04 PM 11/21/2021  2:35 PM  Depression screen PHQ 2/9  Decreased Interest 1 2 1 1  0 0 0  Down, Depressed, Hopeless 1 2 1 1  0 0 0  PHQ - 2 Score 2 4 2 2  0 0 0  Altered sleeping 2  2      Tired, decreased energy 2  2      Change in appetite 0  0      Feeling bad or failure about yourself  0  0      Trouble concentrating 0  0      Moving slowly or fidgety/restless 0  0      Suicidal thoughts 0  0      PHQ-9 Score 6  6      Difficult doing work/chores   Somewhat difficult        Review of Systems  Musculoskeletal:  Positive for back pain and neck pain.       Pain in both shoulders, pain behind the left knee, both feet & hips  All other systems reviewed and are negative.      Objective:   Physical Exam Awake, alert, appropriate, flat affect, NAD No assistive device        Assessment & Plan:   Pt is a 48 yr old female with dx of Fibromyalgia, HTN, and hypothyroidism- last TSH 1.07;  Has medullar sponge kidney.  Here for evaluation of fibromyalgia- got dx 10/21- but Sx's since 2017. Also has myofascial pain syndrome- also has anxiety-  Here for f/u on myofascial pain syndrome-  and to get Trigger point injections.   Dr Claudene- DO/OMT doesn't think pt has Fibromyalgia per his note.  Suggest voltaren  gel up to 4x/day.  As needed- don't bathe in it, but apply no more than 4 grams on LE's and 2 g on Ue's at one time (not 6 Grams)   2.  Can prescribe Tramadol  in future-  if she ever wants- so could get a few more pills/month- say 1 pill/day vs her taking it rarely during the week.  I think it would do well controlling her pain- more than other meds and can use with kidney issues- but of course it's up to pt.    3.  Just refilled Lyrica - for 6 months-  so continue 150 mg Qday vs BID.    4. Con't gabapentin  200 mg at bedtime   5. Dr  Smith-prescribing Vibryd  and doing OMT q 6 weeks  and then [facility] by [facility].    6.   F/U in 6 months- f/u on chronic pain   7. Doesn't need Trp Injections- body overreacts to them   I spent a total of 21   minutes on total care today- >50% coordination of care- due to  going over issues with chronic pain- as detailed above- d/w options for pain- and detailed above.

## 2023-10-08 NOTE — Patient Instructions (Signed)
 Pt is a 48 yr old female with dx of Fibromyalgia, HTN, and hypothyroidism- last TSH 1.07;  Has medullar sponge kidney.  Here for evaluation of fibromyalgia- got dx 10/21- but Sx's since 2017. Also has myofascial pain syndrome- also has anxiety-  Here for f/u on myofascial pain syndrome-  and to get Trigger point injections.   Dr Claudene- DO/OMT doesn't think pt has Fibromyalgia per his note.  Suggest voltaren  gel up to 4x/day.  As needed- don't bathe in it, but apply no more than 4 grams on LE's and 2 g on Ue's at one time (not 6 Grams)   2.  Can prescribe Tramadol  in future-  if she ever wants- so could get a few more pills/month- say 1 pill/day vs her taking it rarely during the week.  I think it would do well controlling her pain- more than other meds and can use with kidney issues- but of course it's up to pt.    3.  Just refilled Lyrica - for 6 months-  so continue 150 mg Qday vs BID.    4. Con't gabapentin  200 mg at bedtime   5. Dr Smith-prescribing Vibryd  and doing OMT q 6 weeks  and then [facility] by [facility].    6.   F/U in 6 months- f/u on chronic pain   7. Doesn't need Trp Injections- body overreacts to them

## 2023-10-13 ENCOUNTER — Encounter: Payer: Self-pay | Admitting: Internal Medicine

## 2023-10-13 NOTE — Progress Notes (Signed)
 Outside notes received. Information abstracted. Notes sent to scan.

## 2023-10-22 DIAGNOSIS — F341 Dysthymic disorder: Secondary | ICD-10-CM | POA: Diagnosis not present

## 2023-10-22 DIAGNOSIS — F419 Anxiety disorder, unspecified: Secondary | ICD-10-CM | POA: Diagnosis not present

## 2023-11-02 ENCOUNTER — Other Ambulatory Visit: Payer: Self-pay

## 2023-11-05 DIAGNOSIS — F419 Anxiety disorder, unspecified: Secondary | ICD-10-CM | POA: Diagnosis not present

## 2023-11-05 DIAGNOSIS — F341 Dysthymic disorder: Secondary | ICD-10-CM | POA: Diagnosis not present

## 2023-11-10 DIAGNOSIS — M797 Fibromyalgia: Secondary | ICD-10-CM | POA: Diagnosis not present

## 2023-11-10 DIAGNOSIS — M25512 Pain in left shoulder: Secondary | ICD-10-CM | POA: Diagnosis not present

## 2023-11-10 DIAGNOSIS — I129 Hypertensive chronic kidney disease with stage 1 through stage 4 chronic kidney disease, or unspecified chronic kidney disease: Secondary | ICD-10-CM | POA: Diagnosis not present

## 2023-11-10 DIAGNOSIS — Q6102 Congenital multiple renal cysts: Secondary | ICD-10-CM | POA: Diagnosis not present

## 2023-11-10 DIAGNOSIS — N200 Calculus of kidney: Secondary | ICD-10-CM | POA: Diagnosis not present

## 2023-11-10 DIAGNOSIS — M545 Low back pain, unspecified: Secondary | ICD-10-CM | POA: Diagnosis not present

## 2023-11-10 DIAGNOSIS — R7303 Prediabetes: Secondary | ICD-10-CM | POA: Diagnosis not present

## 2023-11-10 DIAGNOSIS — N182 Chronic kidney disease, stage 2 (mild): Secondary | ICD-10-CM | POA: Diagnosis not present

## 2023-11-11 NOTE — Progress Notes (Signed)
 [name] [address] [place] [address] Phone: [phone] Subjective:   [name] am a scribe for Dr. Claudene.   I'm seeing this patient by the request  of:  Lorraine Glade PARAS, MD  CC: Back and neck pain follow-up  YEP:Dlagzrupcz  [name] is a 48 y.o. female coming in with complaint of back and neck pain. OMT on 09/07/2023. Patient states that they are the usual. Low back pain SI Joint.  Medications patient has been prescribed:   Taking:         Reviewed prior external information including notes and imaging from previsou exam, outside providers and external EMR if available.   As well as notes that were available from care everywhere and other healthcare systems.  Past medical history, social, surgical and family history all reviewed in electronic medical record.  No pertanent information unless stated regarding to the chief complaint.   Past Medical History:  Diagnosis Date   Allergy    Autoimmune disorder (HCC)    Crohn's disease (HCC)    Endometriosis    Hypertension    Kidney, medullary sponge    Ovarian cyst    Pre-eclampsia    Raynaud's disease    Stress incontinence in female     Allergies  Allergen Reactions   Lamisil [Terbinafine] Hives and Itching    In pill form     Review of Systems:  No headache, visual changes, nausea, vomiting, diarrhea, constipation, dizziness, abdominal pain, skin rash, fevers, chills, night sweats, weight loss, swollen lymph nodes, body aches, joint swelling, chest pain, shortness of breath, mood changes. POSITIVE muscle aches  Objective  Blood pressure 100/60, pulse 95, height 5' 2 (1.575 m), weight 145 lb 12.8 oz (66.1 kg), SpO2 98%.   General: No apparent distress alert and oriented x3 mood and affect normal, dressed appropriately.  HEENT: Pupils equal, extraocular movements intact  Respiratory: Patient's speak in full sentences and does not appear short of  breath  Cardiovascular: No lower extremity edema, non tender, no erythema  Gait MSK:  Back does have some loss of lordosis in the lumbar spine.  Significant tightness noted in the right parascapular area.  No midline tenderness noted.  Osteopathic findings  C2 flexed rotated and side bent right T3 extended rotated and side bent right inhaled rib T6 extended rotated and side bent right inhaled rib L2 flexed rotated and side bent right L5 flexed rotated and side bent left Sacrum right on right       Assessment and Plan:  SI (sacroiliac) joint dysfunction Continues to have chronic difficulty with this as well as the right tendinopathy of the gluteal area.  Continuing to respond though relatively well to manipulation.  Patient has been doing some meditation and continuing to try to work more holistically and doing very well.  Discussed which activities to do and which ones to avoid.  Increase activity slowly.  Follow-up again in 6 to 8 weeks.  Chronic pain syndrome Continue same medications at this time.  Seems to be doing relatively well.  No other significant changes.  Scapular dysfunction Scapular dyskinesis, continue to work on home exercises and strengthening.    Nonallopathic problems  Decision today to treat with OMT was based on Physical Exam  After verbal consent patient was treated with HVLA, ME, FPR techniques in cervical, rib, thoracic, lumbar, and sacral  areas  Patient tolerated the procedure well with improvement in symptoms  Patient given exercises,  stretches and lifestyle modifications  See medications in patient instructions if given  Patient will follow up in 4-8 weeks    The above documentation has been reviewed and is accurate and complete Lorraine Glanz M Mert Dietrick, DO          Note: This dictation was prepared with Dragon dictation along with smaller phrase technology. Any transcriptional errors that result from this process are unintentional.

## 2023-11-12 ENCOUNTER — Ambulatory Visit: Admitting: Family Medicine

## 2023-11-12 VITALS — BP 100/60 | HR 95 | Ht 62.0 in | Wt 145.8 lb

## 2023-11-12 DIAGNOSIS — G894 Chronic pain syndrome: Secondary | ICD-10-CM | POA: Diagnosis not present

## 2023-11-12 DIAGNOSIS — M9904 Segmental and somatic dysfunction of sacral region: Secondary | ICD-10-CM

## 2023-11-12 DIAGNOSIS — M9901 Segmental and somatic dysfunction of cervical region: Secondary | ICD-10-CM | POA: Diagnosis not present

## 2023-11-12 DIAGNOSIS — M9902 Segmental and somatic dysfunction of thoracic region: Secondary | ICD-10-CM | POA: Diagnosis not present

## 2023-11-12 DIAGNOSIS — M9903 Segmental and somatic dysfunction of lumbar region: Secondary | ICD-10-CM

## 2023-11-12 DIAGNOSIS — M899 Disorder of bone, unspecified: Secondary | ICD-10-CM

## 2023-11-12 DIAGNOSIS — M533 Sacrococcygeal disorders, not elsewhere classified: Secondary | ICD-10-CM

## 2023-11-12 DIAGNOSIS — M9908 Segmental and somatic dysfunction of rib cage: Secondary | ICD-10-CM | POA: Diagnosis not present

## 2023-11-12 NOTE — Assessment & Plan Note (Signed)
 Scapular dyskinesis, continue to work on home exercises and strengthening.

## 2023-11-12 NOTE — Patient Instructions (Signed)
 IYTA on your stomach Focus on shoulders blades and shoulder Try bag on other shoulder See you again in 2-3 months

## 2023-11-12 NOTE — Assessment & Plan Note (Signed)
 Continue same medications at this time.  Seems to be doing relatively well.  No other significant changes.

## 2023-11-12 NOTE — Assessment & Plan Note (Signed)
 Continues to have chronic difficulty with this as well as the right tendinopathy of the gluteal area.  Continuing to respond though relatively well to manipulation.  Patient has been doing some meditation and continuing to try to work more holistically and doing very well.  Discussed which activities to do and which ones to avoid.  Increase activity slowly.  Follow-up again in 6 to 8 weeks.

## 2023-11-13 ENCOUNTER — Encounter: Payer: Self-pay | Admitting: Family Medicine

## 2023-11-17 DIAGNOSIS — M797 Fibromyalgia: Secondary | ICD-10-CM | POA: Diagnosis not present

## 2023-11-17 DIAGNOSIS — M25512 Pain in left shoulder: Secondary | ICD-10-CM | POA: Diagnosis not present

## 2023-11-17 DIAGNOSIS — M545 Low back pain, unspecified: Secondary | ICD-10-CM | POA: Diagnosis not present

## 2023-11-19 DIAGNOSIS — F419 Anxiety disorder, unspecified: Secondary | ICD-10-CM | POA: Diagnosis not present

## 2023-11-19 DIAGNOSIS — F341 Dysthymic disorder: Secondary | ICD-10-CM | POA: Diagnosis not present

## 2023-11-24 DIAGNOSIS — M545 Low back pain, unspecified: Secondary | ICD-10-CM | POA: Diagnosis not present

## 2023-11-24 DIAGNOSIS — M797 Fibromyalgia: Secondary | ICD-10-CM | POA: Diagnosis not present

## 2023-11-24 DIAGNOSIS — M25512 Pain in left shoulder: Secondary | ICD-10-CM | POA: Diagnosis not present

## 2023-11-28 ENCOUNTER — Other Ambulatory Visit: Payer: Self-pay | Admitting: Internal Medicine

## 2023-11-29 ENCOUNTER — Other Ambulatory Visit: Payer: Self-pay

## 2023-11-29 ENCOUNTER — Other Ambulatory Visit (HOSPITAL_COMMUNITY): Payer: Self-pay

## 2023-11-29 MED ORDER — TRAMADOL HCL 50 MG PO TABS
50.0000 mg | ORAL_TABLET | Freq: Two times a day (BID) | ORAL | 0 refills | Status: DC | PRN
  Filled 2023-11-29 – 2023-12-10 (×2): qty 30, 15d supply, fill #0

## 2023-12-01 DIAGNOSIS — M797 Fibromyalgia: Secondary | ICD-10-CM | POA: Diagnosis not present

## 2023-12-01 DIAGNOSIS — M25512 Pain in left shoulder: Secondary | ICD-10-CM | POA: Diagnosis not present

## 2023-12-01 DIAGNOSIS — M545 Low back pain, unspecified: Secondary | ICD-10-CM | POA: Diagnosis not present

## 2023-12-08 DIAGNOSIS — M545 Low back pain, unspecified: Secondary | ICD-10-CM | POA: Diagnosis not present

## 2023-12-08 DIAGNOSIS — M25512 Pain in left shoulder: Secondary | ICD-10-CM | POA: Diagnosis not present

## 2023-12-08 DIAGNOSIS — M797 Fibromyalgia: Secondary | ICD-10-CM | POA: Diagnosis not present

## 2023-12-09 ENCOUNTER — Other Ambulatory Visit (HOSPITAL_COMMUNITY): Payer: Self-pay

## 2023-12-10 ENCOUNTER — Other Ambulatory Visit (HOSPITAL_COMMUNITY): Payer: Self-pay

## 2023-12-15 DIAGNOSIS — M25512 Pain in left shoulder: Secondary | ICD-10-CM | POA: Diagnosis not present

## 2023-12-15 DIAGNOSIS — M797 Fibromyalgia: Secondary | ICD-10-CM | POA: Diagnosis not present

## 2023-12-15 DIAGNOSIS — M545 Low back pain, unspecified: Secondary | ICD-10-CM | POA: Diagnosis not present

## 2023-12-17 DIAGNOSIS — F419 Anxiety disorder, unspecified: Secondary | ICD-10-CM | POA: Diagnosis not present

## 2023-12-17 DIAGNOSIS — F341 Dysthymic disorder: Secondary | ICD-10-CM | POA: Diagnosis not present

## 2023-12-19 ENCOUNTER — Other Ambulatory Visit: Payer: Self-pay | Admitting: Internal Medicine

## 2023-12-20 ENCOUNTER — Other Ambulatory Visit (HOSPITAL_COMMUNITY): Payer: Self-pay

## 2023-12-20 MED ORDER — LEVOTHYROXINE SODIUM 75 MCG PO TABS
75.0000 ug | ORAL_TABLET | Freq: Every day | ORAL | 0 refills | Status: DC
  Filled 2023-12-20: qty 90, 90d supply, fill #0

## 2023-12-21 ENCOUNTER — Other Ambulatory Visit (HOSPITAL_COMMUNITY): Payer: Self-pay

## 2023-12-26 ENCOUNTER — Other Ambulatory Visit: Payer: Self-pay | Admitting: Family Medicine

## 2023-12-27 ENCOUNTER — Other Ambulatory Visit (HOSPITAL_COMMUNITY): Payer: Self-pay

## 2023-12-27 DIAGNOSIS — M25512 Pain in left shoulder: Secondary | ICD-10-CM | POA: Diagnosis not present

## 2023-12-27 DIAGNOSIS — M545 Low back pain, unspecified: Secondary | ICD-10-CM | POA: Diagnosis not present

## 2023-12-27 DIAGNOSIS — M797 Fibromyalgia: Secondary | ICD-10-CM | POA: Diagnosis not present

## 2023-12-27 MED ORDER — VILAZODONE HCL 20 MG PO TABS
1.0000 | ORAL_TABLET | Freq: Every day | ORAL | 0 refills | Status: DC
  Filled 2023-12-27: qty 90, 90d supply, fill #0

## 2023-12-31 DIAGNOSIS — F419 Anxiety disorder, unspecified: Secondary | ICD-10-CM | POA: Diagnosis not present

## 2023-12-31 DIAGNOSIS — F341 Dysthymic disorder: Secondary | ICD-10-CM | POA: Diagnosis not present

## 2024-01-03 ENCOUNTER — Other Ambulatory Visit: Payer: Self-pay | Admitting: Obstetrics & Gynecology

## 2024-01-03 DIAGNOSIS — Z1231 Encounter for screening mammogram for malignant neoplasm of breast: Secondary | ICD-10-CM

## 2024-01-04 NOTE — Progress Notes (Unsigned)
 [name] [address] [place] [address] Phone: [phone] Subjective:   [name], am serving as a scribe for Dr. Arthea Claudene.  I'm seeing this patient by the request  of:  Lorraine Glade PARAS, MD  CC: Back and neck pain  YEP:Dlagzrupcz  [name] is a 48 y.o. female coming in with complaint of back and neck pain. OMT on 11/12/2023. Patient states that she is not feeling well today. Having a flare.   Medications patient has been prescribed: Viibyrd  Taking:         Reviewed prior external information including notes and imaging from previsou exam, outside providers and external EMR if available.   As well as notes that were available from care everywhere and other healthcare systems.  Past medical history, social, surgical and family history all reviewed in electronic medical record.  No pertanent information unless stated regarding to the chief complaint.   Past Medical History:  Diagnosis Date   Allergy    Autoimmune disorder    Crohn's disease (HCC)    Endometriosis    Hypertension    Kidney, medullary sponge    Ovarian cyst    Pre-eclampsia    Raynaud's disease    Stress incontinence in female     Allergies  Allergen Reactions   Lamisil [Terbinafine] Hives and Itching    In pill form     Review of Systems:  No headache, visual changes, nausea, vomiting, diarrhea, constipation, dizziness, abdominal pain, skin rash, fevers, chills, night sweats, weight loss, swollen lymph nodes, body aches, joint swelling, chest pain, shortness of breath, mood changes. POSITIVE muscle aches  Objective  Blood pressure 116/82, pulse 80, height 5' 2 (1.575 m), weight 143 lb (64.9 kg), SpO2 98%.   General: No apparent distress alert and oriented x3 mood and affect normal, dressed appropriately.  HEENT: Pupils equal, extraocular movements intact  Respiratory: Patient's speak in full sentences and does not appear short of  breath  Cardiovascular: No lower extremity edema, non tender, no erythema  Gait very cautious MSK:  Back does have some loss lordosis noted.  Some tenderness to palpation of the paraspinal musculature.  Osteopathic findings  C2 flexed rotated and side bent right C6 flexed rotated and side bent left T3 extended rotated and side bent right inhaled rib T9 extended rotated and side bent left L2 flexed rotated and side bent right L3 flexed rotated and side bent left Sacrum right on right    Assessment and Plan:  SI (sacroiliac) joint dysfunction Significant exacerbation noted.  Discussed icing regimen and home exercises, patient given Toradol  and Depo-Medrol  injection secondary to the severity of pain at the moment.  Trazodone to give her to try to help her with some sleep.  Discussed with patient about which activities to do and which ones to avoid.  Try to be active or possible.  Follow-up again in 6 to 12 weeks    Nonallopathic problems  Decision today to treat with OMT was based on Physical Exam  After verbal consent patient was treated with HVLA, ME, FPR techniques in cervical, rib, thoracic, lumbar, and sacral  areas had difficulty with HVLA today though.  Patient tolerated the procedure well with improvement in symptoms  Patient given exercises, stretches and lifestyle modifications  See medications in patient instructions if given  Patient will follow up in 4-8 weeks     The above documentation has been reviewed and is accurate and complete [name]  CHRISTELLA Sharps, DO         Note: This dictation was prepared with Dragon dictation along with smaller phrase technology. Any transcriptional errors that result from this process are unintentional.

## 2024-01-06 ENCOUNTER — Other Ambulatory Visit (HOSPITAL_COMMUNITY): Payer: Self-pay

## 2024-01-06 ENCOUNTER — Other Ambulatory Visit: Payer: Self-pay

## 2024-01-07 ENCOUNTER — Other Ambulatory Visit (HOSPITAL_COMMUNITY): Payer: Self-pay

## 2024-01-07 ENCOUNTER — Ambulatory Visit: Admitting: Family Medicine

## 2024-01-07 ENCOUNTER — Encounter: Payer: Self-pay | Admitting: Family Medicine

## 2024-01-07 VITALS — BP 116/82 | HR 80 | Ht 62.0 in | Wt 143.0 lb

## 2024-01-07 DIAGNOSIS — M9902 Segmental and somatic dysfunction of thoracic region: Secondary | ICD-10-CM | POA: Diagnosis not present

## 2024-01-07 DIAGNOSIS — G894 Chronic pain syndrome: Secondary | ICD-10-CM | POA: Diagnosis not present

## 2024-01-07 DIAGNOSIS — M9904 Segmental and somatic dysfunction of sacral region: Secondary | ICD-10-CM | POA: Diagnosis not present

## 2024-01-07 DIAGNOSIS — M9903 Segmental and somatic dysfunction of lumbar region: Secondary | ICD-10-CM | POA: Diagnosis not present

## 2024-01-07 DIAGNOSIS — M533 Sacrococcygeal disorders, not elsewhere classified: Secondary | ICD-10-CM

## 2024-01-07 DIAGNOSIS — M9908 Segmental and somatic dysfunction of rib cage: Secondary | ICD-10-CM

## 2024-01-07 DIAGNOSIS — M9901 Segmental and somatic dysfunction of cervical region: Secondary | ICD-10-CM

## 2024-01-07 MED ORDER — TRAZODONE HCL 50 MG PO TABS
25.0000 mg | ORAL_TABLET | Freq: Every evening | ORAL | 3 refills | Status: AC | PRN
  Filled 2024-01-07: qty 30, 30d supply, fill #0

## 2024-01-07 MED ORDER — KETOROLAC TROMETHAMINE 30 MG/ML IJ SOLN
30.0000 mg | Freq: Once | INTRAMUSCULAR | Status: AC
  Administered 2024-01-07: 30 mg via INTRAMUSCULAR

## 2024-01-07 MED ORDER — METHYLPREDNISOLONE ACETATE 40 MG/ML IJ SUSP
40.0000 mg | Freq: Once | INTRAMUSCULAR | Status: AC
  Administered 2024-01-07: 40 mg via INTRAMUSCULAR

## 2024-01-07 NOTE — Patient Instructions (Addendum)
 Trazadone called in Half cocktail injection today in backside. See me in 7-8 weeks

## 2024-01-07 NOTE — Assessment & Plan Note (Signed)
 Significant exacerbation noted.  Discussed icing regimen and home exercises, patient given Toradol  and Depo-Medrol  injection secondary to the severity of pain at the moment.  Trazodone to give her to try to help her with some sleep.  Discussed with patient about which activities to do and which ones to avoid.  Try to be active or possible.  Follow-up again in 6 to 12 weeks

## 2024-01-14 ENCOUNTER — Other Ambulatory Visit (HOSPITAL_COMMUNITY): Payer: Self-pay

## 2024-01-14 DIAGNOSIS — F341 Dysthymic disorder: Secondary | ICD-10-CM | POA: Diagnosis not present

## 2024-01-14 DIAGNOSIS — F419 Anxiety disorder, unspecified: Secondary | ICD-10-CM | POA: Diagnosis not present

## 2024-01-17 ENCOUNTER — Other Ambulatory Visit (HOSPITAL_COMMUNITY): Payer: Self-pay

## 2024-01-25 ENCOUNTER — Other Ambulatory Visit (HOSPITAL_COMMUNITY): Payer: Self-pay

## 2024-01-26 ENCOUNTER — Ambulatory Visit
Admission: RE | Admit: 2024-01-26 | Discharge: 2024-01-26 | Disposition: A | Discharge status: Home / Self Care | Source: Ambulatory Visit | Attending: Obstetrics & Gynecology | Admitting: Obstetrics & Gynecology

## 2024-01-26 DIAGNOSIS — Z1231 Encounter for screening mammogram for malignant neoplasm of breast: Secondary | ICD-10-CM | POA: Diagnosis not present

## 2024-01-28 DIAGNOSIS — F419 Anxiety disorder, unspecified: Secondary | ICD-10-CM | POA: Diagnosis not present

## 2024-01-28 DIAGNOSIS — F341 Dysthymic disorder: Secondary | ICD-10-CM | POA: Diagnosis not present

## 2024-02-09 ENCOUNTER — Other Ambulatory Visit (HOSPITAL_COMMUNITY): Payer: Self-pay

## 2024-02-10 ENCOUNTER — Encounter: Payer: Self-pay | Admitting: Internal Medicine

## 2024-02-10 NOTE — Progress Notes (Unsigned)
 Subjective:    Patient ID: [name], female    DOB: [birth year], 48 y.o.   MRN: [id number]     HPI Ki is here for follow up of her chronic medical problems.   Discussed the use of AI scribe software for clinical note transcription with the patient, who gave verbal consent to proceed.  History of Present Illness [name] is a 48 year old female with polycystic kidney disease and fibromyalgia who presents with chronic pain management concerns.  She reports a history of polycystic kidney disease and was recently evaluated by a nephrologist, who, according to her recollection, discussed her kidney function and mentioned her GFR was over 60. Her urinalysis was clean. She expresses frustration with the nephrology appointment process, noting a lack of review of her pre-filled paperwork and a long wait for the appointment.  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 She describes a recent flare-up with neck pain, difficulty turning her head, and disrupted sleep. She uses tramadol  more frequently due to the inability to use NSAIDs, taking it mainly on weekends or occasionally at work. She also uses Lyrica  and practices stretching and heat therapy to manage symptoms. Sleep is crucial for managing her fibromyalgia.  She reports occasional palpitations, particularly later in the day, and denies any chest pain or shortness of breath. She also mentions mild leg swelling, which she associates with Norvasc  use, and occasional lightheadedness possibly related to sinus issues.  No recent headaches, urinary tract infections, or significant respiratory symptoms. She notes a history of sinus congestion and laryngitis but has managed to avoid sinus infections recently. She is currently on thyroid  medication and tramadol .      Medications and allergies reviewed with  patient and updated if appropriate.  Current Outpatient Medications on File Prior to Visit  Medication Sig Dispense Refill   Acetaminophen -Caffeine (EXCEDRIN TENSION HEADACHE) 500-65 MG TABS Take 2 tablets by mouth daily as needed (headache).     amLODipine  (NORVASC ) 5 MG tablet Take 1 tablet (5 mg total) by mouth daily. 90 tablet 1   Cholecalciferol (VITAMIN D3) 2000 units TABS Take 2,000 Units by mouth daily.     docusate sodium (COLACE) 100 MG capsule Take 100 mg by mouth daily.     DOTTI  0.05 MG/24HR patch Place 1 patch (0.05 mg total) onto the skin 2 (two) times a week. 24 patch 3   gabapentin  (NEURONTIN ) 100 MG capsule Take 2 capsules (200 mg total) by mouth 2 (two) times daily. To take with Lyrica . 360 capsule 1   levothyroxine  (SYNTHROID ) 75 MCG tablet Take 1 tablet (75 mcg total) by mouth daily before breakfast. 90 tablet 0   multivitamin-lutein (OCUVITE-LUTEIN) CAPS capsule Take 1 capsule by mouth daily.     nitrofurantoin  (MACRODANTIN ) 50 MG capsule Take as directed after intercourse 45 capsule 0   omeprazole (PRILOSEC) 20 MG capsule Take 20 mg by mouth daily.     pregabalin  (LYRICA ) 150 MG capsule Take 1 capsule (150 mg total) by mouth 2 (two) times daily. 60 capsule 5   progesterone  (PROMETRIUM ) 100 MG capsule Take 1 capsule (100 mg total) by mouth at bedtime. 90 capsule 3   Propylene Glycol (SYSTANE COMPLETE OP) Place 1 drop into both eyes daily.     tiZANidine  (ZANAFLEX ) 4 MG tablet Take 1 tablet (4 mg total) by mouth at bedtime. 30 tablet 0   traMADol  (ULTRAM )  50 MG tablet Take 1 tablet (50 mg total) by mouth every 12 (twelve) hours as needed for moderate pain 30 tablet 0   traZODone  (DESYREL ) 50 MG tablet Take 1/2-1 tablet (25-50 mg total) by mouth at bedtime as needed for sleep. 30 tablet 3   Vilazodone  HCl (VIIBRYD ) 20 MG TABS Take 1 tablet (20 mg total) by mouth daily. 90 tablet 0   [DISCONTINUED] estradiol -levonorgestrel  (CLIMARA  PRO) 0.045-0.015 MG/DAY Place 1 patch onto  the skin once a week. 12 patch 0   [DISCONTINUED] estradiol -norethindrone  (COMBIPATCH ) 0.05-0.25 MG/DAY Place 1 patch onto the skin 2 (two) times a week. 24 patch 3   No current facility-administered medications on file prior to visit.     Review of Systems  Constitutional:  Negative for fever.  HENT:  Positive for congestion (mild), postnasal drip and sore throat.   Respiratory:  Negative for cough, shortness of breath and wheezing.   Cardiovascular:  Positive for palpitations and leg swelling (slight end of day). Negative for chest pain.  Musculoskeletal:  Positive for back pain, myalgias and neck stiffness.  Neurological:  Positive for light-headedness (occ). Negative for headaches.       Objective:   Vitals:   02/11/24 1409  BP: 120/82  Pulse: 77  Temp: 97.7 F (36.5 C)  SpO2: 99%   BP Readings from Last 3 Encounters:  02/11/24 120/82  01/07/24 116/82  11/12/23 100/60   Wt Readings from Last 3 Encounters:  02/11/24 148 lb (67.1 kg)  01/07/24 143 lb (64.9 kg)  11/12/23 145 lb 12.8 oz (66.1 kg)   Body mass index is 27.07 kg/m.    Physical Exam Constitutional:      General: She is not in acute distress.    Appearance: Normal appearance.  HENT:     Head: Normocephalic and atraumatic.  Eyes:     Conjunctiva/sclera: Conjunctivae normal.  Cardiovascular:     Rate and Rhythm: Normal rate and regular rhythm.     Heart sounds: Normal heart sounds.  Pulmonary:     Effort: Pulmonary effort is normal. No respiratory distress.     Breath sounds: Normal breath sounds. No wheezing.  Musculoskeletal:     Cervical back: Neck supple.     Right lower leg: No edema.     Left lower leg: No edema.  Lymphadenopathy:     Cervical: No cervical adenopathy.  Skin:    General: Skin is warm and dry.     Findings: No rash.  Neurological:     Mental Status: She is alert. Mental status is at baseline.  Psychiatric:        Mood and Affect: Mood normal.        Behavior: Behavior  normal.        Lab Results  Component Value Date   WBC 9.9 07/30/2023   HGB 14.3 07/30/2023   HCT 42.6 07/30/2023   PLT 278.0 07/30/2023   GLUCOSE 93 07/30/2023   CHOL 224 (H) 07/30/2023   TRIG 101.0 07/30/2023   HDL 64.90 07/30/2023   LDLCALC 139 (H) 07/30/2023   ALT 18 07/30/2023   AST 16 07/30/2023   NA 140 07/30/2023   K 3.8 07/30/2023   CL 102 07/30/2023   CREATININE 1.07 07/30/2023   BUN 18 07/30/2023   CO2 30 07/30/2023   TSH 0.94 07/30/2023   HGBA1C 5.8 07/30/2023     Assessment & Plan:    See Problem List for Assessment and Plan of chronic medical problems.

## 2024-02-10 NOTE — Patient Instructions (Addendum)
      Blood work was ordered.       Medications changes include :   None      Return in about 6 months (around 08/11/2024) for Physical Exam.

## 2024-02-11 ENCOUNTER — Ambulatory Visit: Admitting: Internal Medicine

## 2024-02-11 VITALS — BP 120/82 | HR 77 | Temp 97.7°F | Ht 62.0 in | Wt 148.0 lb

## 2024-02-11 DIAGNOSIS — M7918 Myalgia, other site: Secondary | ICD-10-CM

## 2024-02-11 DIAGNOSIS — N182 Chronic kidney disease, stage 2 (mild): Secondary | ICD-10-CM | POA: Diagnosis not present

## 2024-02-11 DIAGNOSIS — N390 Urinary tract infection, site not specified: Secondary | ICD-10-CM | POA: Diagnosis not present

## 2024-02-11 DIAGNOSIS — E039 Hypothyroidism, unspecified: Secondary | ICD-10-CM

## 2024-02-11 DIAGNOSIS — I10 Essential (primary) hypertension: Secondary | ICD-10-CM

## 2024-02-11 DIAGNOSIS — K219 Gastro-esophageal reflux disease without esophagitis: Secondary | ICD-10-CM | POA: Diagnosis not present

## 2024-02-11 DIAGNOSIS — R7303 Prediabetes: Secondary | ICD-10-CM

## 2024-02-11 DIAGNOSIS — F341 Dysthymic disorder: Secondary | ICD-10-CM | POA: Diagnosis not present

## 2024-02-11 DIAGNOSIS — F419 Anxiety disorder, unspecified: Secondary | ICD-10-CM | POA: Diagnosis not present

## 2024-02-11 LAB — LIPID PANEL
Cholesterol: 180 mg/dL (ref 0–200)
HDL: 64.8 mg/dL (ref >39.00)
LDL Cholesterol: 83 mg/dL (ref 0–99)
NonHDL: 114.7
Total CHOL/HDL Ratio: 3
Triglycerides: 159 mg/dL — ABNORMAL HIGH (ref 0.0–149.0)
VLDL: 31.8 mg/dL (ref 0.0–40.0)

## 2024-02-11 LAB — COMPREHENSIVE METABOLIC PANEL WITH GFR
ALT: 14 U/L (ref 0–35)
AST: 16 U/L (ref 0–37)
Albumin: 4.6 g/dL (ref 3.5–5.2)
Alkaline Phosphatase: 100 U/L (ref 39–117)
BUN: 12 mg/dL (ref 6–23)
CO2: 30 meq/L (ref 19–32)
Calcium: 9.2 mg/dL (ref 8.4–10.5)
Chloride: 103 meq/L (ref 96–112)
Creatinine, Ser: 0.81 mg/dL (ref 0.40–1.20)
GFR: 84.48 mL/min (ref >60.00)
Glucose, Bld: 110 mg/dL — ABNORMAL HIGH (ref 70–99)
Potassium: 3.9 meq/L (ref 3.5–5.1)
Sodium: 140 meq/L (ref 135–145)
Total Bilirubin: 0.5 mg/dL (ref 0.2–1.2)
Total Protein: 7.1 g/dL (ref 6.0–8.3)

## 2024-02-11 LAB — CBC WITH DIFFERENTIAL/PLATELET
Basophils Absolute: 0.1 K/uL (ref 0.0–0.1)
Basophils Relative: 1.2 % (ref 0.0–3.0)
Eosinophils Absolute: 0.4 K/uL (ref 0.0–0.7)
Eosinophils Relative: 4.3 % (ref 0.0–5.0)
HCT: 42.2 % (ref 36.0–46.0)
Hemoglobin: 14.1 g/dL (ref 12.0–15.0)
Lymphocytes Relative: 33.8 % (ref 12.0–46.0)
Lymphs Abs: 3.1 K/uL (ref 0.7–4.0)
MCHC: 33.4 g/dL (ref 30.0–36.0)
MCV: 93.5 fl (ref 78.0–100.0)
Monocytes Absolute: 0.6 K/uL (ref 0.1–1.0)
Monocytes Relative: 6.7 % (ref 3.0–12.0)
Neutro Abs: 4.9 K/uL (ref 1.4–7.7)
Neutrophils Relative %: 54 % (ref 43.0–77.0)
Platelets: 254 K/uL (ref 150.0–400.0)
RBC: 4.51 Mil/uL (ref 3.87–5.11)
RDW: 13.3 % (ref 11.5–15.5)
WBC: 9.1 K/uL (ref 4.0–10.5)

## 2024-02-11 LAB — IBC PANEL
Iron: 88 ug/dL (ref 42–145)
Saturation Ratios: 23.4 % (ref 20.0–50.0)
TIBC: 376.6 ug/dL (ref 250.0–450.0)
Transferrin: 269 mg/dL (ref 212.0–360.0)

## 2024-02-11 LAB — TSH: TSH: 1.03 u[IU]/mL (ref 0.35–5.50)

## 2024-02-11 LAB — VITAMIN D 25 HYDROXY (VIT D DEFICIENCY, FRACTURES): VITD: 28.49 ng/mL — ABNORMAL LOW (ref 30.00–100.00)

## 2024-02-11 LAB — FERRITIN: Ferritin: 50.6 ng/mL (ref 10.0–291.0)

## 2024-02-11 LAB — HEMOGLOBIN A1C: Hgb A1c MFr Bld: 5.4 % (ref 4.6–6.5)

## 2024-02-11 LAB — VITAMIN B12: Vitamin B-12: 324 pg/mL (ref 211–911)

## 2024-02-11 NOTE — Assessment & Plan Note (Signed)
Chronic Blood pressure well controlled CMP, cbc Continue amlodipine 5 mg daily

## 2024-02-11 NOTE — Assessment & Plan Note (Signed)
 Chronic Myofascial pain and fibromyalgia Trigger point injections  Following with Dr Claudene and Dr lovorn Taking vilazodone , lyrica , tizanidine  4 mg at bed prn, gabapentin  and tramadol  as needed Continue tramadol  50 mg twice daily prn

## 2024-02-11 NOTE — Assessment & Plan Note (Signed)
 Chronic Lab Results  Component Value Date   HGBA1C 5.4 02/11/2024   Check a1c Low sugar / carb diet Stressed regular exercise

## 2024-02-11 NOTE — Assessment & Plan Note (Signed)
 Chronic  euthyroid Check tsh  Currently taking levothyroxine  50 mcg daily

## 2024-02-11 NOTE — Assessment & Plan Note (Signed)
Chronic Has medullary sponge kidney disease Frequent UTIs Continue nitrofurantoin 50 mg after intercourse for prevention 

## 2024-02-11 NOTE — Assessment & Plan Note (Signed)
Chronic GERD controlled Continue omeprazole 20 mg daily  

## 2024-02-11 NOTE — Assessment & Plan Note (Signed)
 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

## 2024-02-12 ENCOUNTER — Ambulatory Visit: Payer: Self-pay | Admitting: Internal Medicine

## 2024-02-12 ENCOUNTER — Encounter: Payer: Self-pay | Admitting: Internal Medicine

## 2024-02-14 ENCOUNTER — Other Ambulatory Visit: Payer: Self-pay

## 2024-02-14 DIAGNOSIS — Z1211 Encounter for screening for malignant neoplasm of colon: Secondary | ICD-10-CM

## 2024-02-15 ENCOUNTER — Other Ambulatory Visit: Payer: Self-pay | Admitting: Obstetrics

## 2024-02-15 ENCOUNTER — Other Ambulatory Visit: Payer: Self-pay

## 2024-02-15 DIAGNOSIS — J321 Chronic frontal sinusitis: Secondary | ICD-10-CM

## 2024-02-15 MED ORDER — AZITHROMYCIN 250 MG PO TABS
ORAL_TABLET | ORAL | 2 refills | Status: AC
  Filled 2024-02-15: qty 15, 14d supply, fill #0
  Filled 2024-02-23 – 2024-02-25 (×2): qty 15, 14d supply, fill #1

## 2024-02-16 ENCOUNTER — Other Ambulatory Visit (HOSPITAL_COMMUNITY): Payer: Self-pay

## 2024-02-16 NOTE — Progress Notes (Unsigned)
 [name] [address] [place] [address] Phone: [phone] Subjective:   [name], am serving as a scribe for Dr. Arthea Claudene.  I'm seeing this patient by the request  of:  Burns, Glade PARAS, MD  CC: Multiple joint complaints  YEP:Dlagzrupcz  [name] is a 48 y.o. female coming in with complaint of back and neck pain. OMT on 01/07/2024. Patient states that she is having an increase in R c-spine and shoulder over past 3 weeks. Painful to extend neck to put in eye drops. Using theracane and stretches.   Lower back pain is the same as last visit.   Medications patient has been prescribed: trazodone  viibyrd  Taking:         Reviewed prior external information including notes and imaging from previsou exam, outside providers and external EMR if available.   As well as notes that were available from care everywhere and other healthcare systems.  Past medical history, social, surgical and family history all reviewed in electronic medical record.  No pertanent information unless stated regarding to the chief complaint.   Past Medical History:  Diagnosis Date   Allergy    Autoimmune disorder    Crohn's disease (HCC)    Endometriosis    Hypertension    Kidney, medullary sponge    Ovarian cyst    Pre-eclampsia    Raynaud's disease    Stress incontinence in female     Allergies  Allergen Reactions   Lamisil [Terbinafine] Hives and Itching    In pill form     Review of Systems:  No headache, visual changes, nausea, vomiting, diarrhea, constipation, dizziness, abdominal pain, skin rash, fevers, chills, night sweats, weight loss, swollen lymph nodes, body aches, joint swelling, chest pain, shortness of breath, mood changes. POSITIVE muscle aches  Objective  Blood pressure 122/86, pulse 98, height 5' 2 (1.575 m), weight 148 lb (67.1 kg), SpO2 98%.   General: No apparent distress alert and oriented x3 mood and  affect normal, dressed appropriately.  HEENT: Pupils equal, extraocular movements intact  Respiratory: Patient's speak in full sentences and does not appear short of breath  Cardiovascular: No lower extremity edema, non tender, no erythema  Gait MSK:  Back mild tightness noted but patient's neck seems to be the tighter area.  Having tightness with sidebending bilaterally.  Negative Spurling's, 5 out of 5 strength of the upper extremities  Osteopathic findings  C2 flexed rotated and side bent right C6 flexed rotated and side bent left C7 flexed rotated and side bent right T3 extended rotated and side bent right inhaled rib T9 extended rotated and side bent left L2 flexed rotated and side bent right L3 flexed rotated and side bent left Sacrum right on right       Assessment and Plan:  Chronic pain syndrome Doing relatively well but did have some increasing in tightness of the neck noted.  Did respond extremely well though to osteopathic manipulation after further evaluation.  Increase activity as tolerated.  Discussed icing regimen, increase activity slowly.  Follow-up again in 6 to 12 weeks    Nonallopathic problems  Decision today to treat with OMT was based on Physical Exam  After verbal consent patient was treated with HVLA, ME, FPR techniques in cervical, rib, thoracic, lumbar, and sacral  areas  Patient tolerated the procedure well with improvement in symptoms  Patient given exercises, stretches and lifestyle modifications  See medications in patient instructions if given  Patient will follow up in 4-8 weeks      The above documentation has been reviewed and is accurate and complete Arthea CHRISTELLA Sharps, DO        Note: This dictation was prepared with Dragon dictation along with smaller phrase technology. Any transcriptional errors that result from this process are unintentional.

## 2024-02-18 ENCOUNTER — Ambulatory Visit: Admitting: Family Medicine

## 2024-02-18 VITALS — BP 122/86 | HR 98 | Ht 62.0 in | Wt 148.0 lb

## 2024-02-18 DIAGNOSIS — M9903 Segmental and somatic dysfunction of lumbar region: Secondary | ICD-10-CM

## 2024-02-18 DIAGNOSIS — M9902 Segmental and somatic dysfunction of thoracic region: Secondary | ICD-10-CM | POA: Diagnosis not present

## 2024-02-18 DIAGNOSIS — M9904 Segmental and somatic dysfunction of sacral region: Secondary | ICD-10-CM | POA: Diagnosis not present

## 2024-02-18 DIAGNOSIS — M9908 Segmental and somatic dysfunction of rib cage: Secondary | ICD-10-CM

## 2024-02-18 DIAGNOSIS — G894 Chronic pain syndrome: Secondary | ICD-10-CM | POA: Diagnosis not present

## 2024-02-18 DIAGNOSIS — M9901 Segmental and somatic dysfunction of cervical region: Secondary | ICD-10-CM | POA: Diagnosis not present

## 2024-02-18 NOTE — Assessment & Plan Note (Signed)
 Doing relatively well but did have some increasing in tightness of the neck noted.  Did respond extremely well though to osteopathic manipulation after further evaluation.  Increase activity as tolerated.  Discussed icing regimen, increase activity slowly.  Follow-up again in 6 to 12 weeks

## 2024-02-18 NOTE — Patient Instructions (Signed)
 See me in 2 months

## 2024-02-25 ENCOUNTER — Other Ambulatory Visit (HOSPITAL_COMMUNITY): Payer: Self-pay

## 2024-02-25 DIAGNOSIS — F411 Generalized anxiety disorder: Secondary | ICD-10-CM | POA: Diagnosis not present

## 2024-02-25 DIAGNOSIS — F341 Dysthymic disorder: Secondary | ICD-10-CM | POA: Diagnosis not present

## 2024-02-28 ENCOUNTER — Other Ambulatory Visit (HOSPITAL_COMMUNITY): Payer: Self-pay

## 2024-02-28 ENCOUNTER — Other Ambulatory Visit: Payer: Self-pay | Admitting: Internal Medicine

## 2024-02-29 ENCOUNTER — Other Ambulatory Visit (HOSPITAL_COMMUNITY): Payer: Self-pay

## 2024-03-04 ENCOUNTER — Other Ambulatory Visit (HOSPITAL_COMMUNITY): Payer: Self-pay

## 2024-03-04 ENCOUNTER — Encounter: Payer: Self-pay | Admitting: Internal Medicine

## 2024-03-07 ENCOUNTER — Other Ambulatory Visit: Payer: Self-pay | Admitting: Family

## 2024-03-07 ENCOUNTER — Other Ambulatory Visit (HOSPITAL_COMMUNITY): Payer: Self-pay

## 2024-03-07 MED ORDER — TRAMADOL HCL 50 MG PO TABS
50.0000 mg | ORAL_TABLET | Freq: Two times a day (BID) | ORAL | 0 refills | Status: AC | PRN
  Filled 2024-03-07 – 2024-03-10 (×2): qty 30, 15d supply, fill #0

## 2024-03-08 ENCOUNTER — Other Ambulatory Visit: Payer: Self-pay

## 2024-03-10 ENCOUNTER — Other Ambulatory Visit (HOSPITAL_COMMUNITY): Payer: Self-pay

## 2024-03-13 ENCOUNTER — Other Ambulatory Visit: Payer: Self-pay | Admitting: Internal Medicine

## 2024-03-13 MED ORDER — LEVOTHYROXINE SODIUM 75 MCG PO TABS
75.0000 ug | ORAL_TABLET | Freq: Every day | ORAL | 0 refills | Status: AC
  Filled 2024-03-13: qty 90, 90d supply, fill #0

## 2024-03-14 ENCOUNTER — Other Ambulatory Visit (HOSPITAL_COMMUNITY): Payer: Self-pay

## 2024-03-14 ENCOUNTER — Other Ambulatory Visit: Payer: Self-pay

## 2024-03-14 ENCOUNTER — Encounter: Payer: Self-pay | Admitting: Pharmacist

## 2024-03-15 ENCOUNTER — Ambulatory Visit: Payer: Self-pay | Admitting: Internal Medicine

## 2024-03-15 LAB — COLOGUARD: COLOGUARD: NEGATIVE

## 2024-03-22 ENCOUNTER — Other Ambulatory Visit: Payer: Self-pay | Admitting: Family Medicine

## 2024-03-22 ENCOUNTER — Other Ambulatory Visit (HOSPITAL_COMMUNITY): Payer: Self-pay

## 2024-03-22 MED ORDER — VILAZODONE HCL 20 MG PO TABS
1.0000 | ORAL_TABLET | Freq: Every day | ORAL | 0 refills | Status: AC
  Filled 2024-03-22: qty 90, 90d supply, fill #0

## 2024-03-26 ENCOUNTER — Other Ambulatory Visit: Payer: Self-pay | Admitting: Internal Medicine

## 2024-03-27 ENCOUNTER — Other Ambulatory Visit: Payer: Self-pay

## 2024-03-27 ENCOUNTER — Other Ambulatory Visit (HOSPITAL_COMMUNITY): Payer: Self-pay

## 2024-03-27 MED ORDER — AMLODIPINE BESYLATE 5 MG PO TABS
5.0000 mg | ORAL_TABLET | Freq: Every day | ORAL | 1 refills | Status: AC
  Filled 2024-03-27: qty 90, 90d supply, fill #0

## 2024-04-07 ENCOUNTER — Other Ambulatory Visit: Payer: Self-pay

## 2024-04-07 ENCOUNTER — Encounter: Attending: Physical Medicine and Rehabilitation | Admitting: Physical Medicine and Rehabilitation

## 2024-04-07 ENCOUNTER — Other Ambulatory Visit (HOSPITAL_COMMUNITY): Payer: Self-pay

## 2024-04-07 ENCOUNTER — Encounter: Payer: Self-pay | Admitting: Physical Medicine and Rehabilitation

## 2024-04-07 VITALS — BP 130/84 | HR 74 | Ht 62.0 in | Wt 148.0 lb

## 2024-04-07 DIAGNOSIS — G894 Chronic pain syndrome: Secondary | ICD-10-CM | POA: Insufficient documentation

## 2024-04-07 DIAGNOSIS — M797 Fibromyalgia: Secondary | ICD-10-CM | POA: Diagnosis present

## 2024-04-07 DIAGNOSIS — M533 Sacrococcygeal disorders, not elsewhere classified: Secondary | ICD-10-CM | POA: Insufficient documentation

## 2024-04-07 DIAGNOSIS — M7918 Myalgia, other site: Secondary | ICD-10-CM | POA: Diagnosis present

## 2024-04-07 MED ORDER — GABAPENTIN 100 MG PO CAPS
200.0000 mg | ORAL_CAPSULE | Freq: Two times a day (BID) | ORAL | 1 refills | Status: AC
  Filled 2024-04-07: qty 360, 90d supply, fill #0

## 2024-04-07 MED ORDER — PREGABALIN 150 MG PO CAPS
150.0000 mg | ORAL_CAPSULE | Freq: Two times a day (BID) | ORAL | 1 refills | Status: AC
  Filled 2024-04-07: qty 180, 90d supply, fill #0

## 2024-04-07 NOTE — Progress Notes (Signed)
 "  Subjective:    Patient ID: [name], female    DOB: 1975-05-04, 49 y.o.   MRN: [id number]  HPI  Pt is a 49 yr old female with dx of Fibromyalgia, HTN, and hypothyroidism- last TSH 1.07;  Also has hx of anxiety-  Has medullar sponge kidney.  Here for evaluation of fibromyalgia- got dx 10/21- but Sx's since 2017. Also has myofascial pain syndrome- and new dx 1/26 polycystic disease- and CKD 2 Here for f/u on Fibromyalgia  and to get Trigger point injections.   Dr Claudene- DO/OMT doesn't think pt has Fibromyalgia per his note.  Been in a flare for awhile- more than 1 month- since before Xmas-  FMS stuff-  Weather makes her hurt and more stiff- even more.   A lot of social stuff going on- esp around the holidays.  Mind body connection.   Did an auto-immune diet  1 year ago, but hasn't been on a lot.   - sugar, wheat- nightshades avoidance, etc  Work life balance isn't good.  And has to do food prep- just hasn't restarted it.   Was doing bette ron that diet and meditate 2x/day 10 minutes at a time.   Has a real injury and foot rolling more and more and R buttock getting smaller.     Lyrica  is helping some- will keep taking it- has run out of it and notices when runs out of it- notices a large difference when doesn't take it.   Still doing gabapentin  200 mg at bedtime and 100 mg In AM Could take 400 mg /day if need be. Uses variably.   Dr Geofm her PCP- with fine refilling it- since doesn't use much-   Has a new dx of polycystic disease-    Wants to maintain rx's- doesn't want to do injections- sometimes injections make pain worse- can flare up her system.   Uses theracane and can feel knots on upper back/shoulders and triceps where it's knotty.   Center/core of trP 's that stays tight.  feels like it pulls on attachments and joints.    Mainly takes Lyrica  150 g BID.   Hasn't had lumbar ESI since 18 months ago- per pt.  Doing OK without it.  Thinks her  pain is moe SI joint - Dr Claudene has injected SI joint- Gets used to it hurting.     Pain Inventory Average Pain 4 Pain Right Now 7 My pain is burning and aching  In the last 24 hours, has pain interfered with the following? General activity 3 Relation with others 1 Enjoyment of life 7 What TIME of day is your pain at its worst? morning  and night Sleep (in general) Poor  Pain is worse with: bending and some activites Pain improves with: heat/ice, therapy/exercise, and medication Relief from Meds: 7  Family History  Problem Relation Age of Onset   Breast cancer Mother    Heart disease Father    Hypertension Father    Hyperlipidemia Father    Heart attack Father    Other Brother        lyme-chronic   Stroke Maternal Grandmother    Alcohol abuse Paternal Grandmother    Heart disease Paternal Grandfather    Colon cancer Neg Hx    Esophageal cancer Neg Hx    Rectal cancer Neg Hx    Social History   Socioeconomic History   Marital status: Married    Spouse name: Not on file   Number  of children: 3   Years of education: Not on file   Highest education level: Bachelor's degree (e.g., BA, AB, BS)  Occupational History   Occupation: Teacher, Adult Education: [employer]  Tobacco Use   Smoking status: Never   Smokeless tobacco: Never  Vaping Use   Vaping status: Never Used  Substance and Sexual Activity   Alcohol use: Yes   Drug use: No   Sexual activity: Yes  Other Topics Concern   Not on file  Social History Narrative   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, OR   Social Drivers of Health   Tobacco Use: Low Risk (02/10/2024)   Patient History    Smoking Tobacco Use: Never    Smokeless Tobacco Use: Never    Passive Exposure: Not on file  Financial Resource Strain: Low Risk (01/22/2023)   Overall Financial Resource Strain (CARDIA)    Difficulty of Paying Living Expenses: Not hard at all  Food Insecurity: No Food Insecurity (01/22/2023)   Hunger Vital Sign     Worried About Running Out of Food in the Last Year: Never true    Ran Out of Food in the Last Year: Never true  Transportation Needs: No Transportation Needs (01/22/2023)   PRAPARE - Administrator, Civil Service (Medical): No    Lack of Transportation (Non-Medical): No  Physical Activity: Insufficiently Active (01/22/2023)   Exercise Vital Sign    Days of Exercise per Week: 1 day    Minutes of Exercise per Session: 10 min  Stress: Stress Concern Present (01/22/2023)   [employer] of Occupational Health - Occupational Stress Questionnaire    Feeling of Stress : To some extent  Social Connections: Moderately Isolated (01/22/2023)   Social Connection and Isolation Panel    Frequency of Communication with Friends and Family: Once a week    Frequency of Social Gatherings with Friends and Family: Once a week    Attends Religious Services: 1 to 4 times per year    Active Member of Clubs or Organizations: No    Attends Engineer, Structural: Not on file    Marital Status: Married  Depression (PHQ2-9): Low Risk (02/11/2024)   Depression (PHQ2-9)    PHQ-2 Score: 4  Alcohol Screen: Low Risk (01/22/2023)   Alcohol Screen    Last Alcohol Screening Score (AUDIT): 2  Housing: Low Risk (01/22/2023)   Housing    Last Housing Risk Score: 0  Utilities: Not on file  Health Literacy: Not on file   Past Surgical History:  Procedure Laterality Date   CHOLECYSTECTOMY     DILATION AND CURETTAGE OF UTERUS     ENDOMETRIAL ABLATION     NOVASURE ABLATION     TUBAL LIGATION     Past Surgical History:  Procedure Laterality Date   CHOLECYSTECTOMY     DILATION AND CURETTAGE OF UTERUS     ENDOMETRIAL ABLATION     NOVASURE ABLATION     TUBAL LIGATION     Past Medical History:  Diagnosis Date   Allergy    Autoimmune disorder    Crohn's disease (HCC)    Endometriosis    Hypertension    Kidney, medullary sponge    Ovarian cyst    Pre-eclampsia    Raynaud's disease     Stress incontinence in female    BP 130/84   Pulse 74   Ht 5' 2 (1.575 m)   Wt 148 lb (67.1 kg)   SpO2  97%   BMI 27.07 kg/m   Opioid Risk Score:   Fall Risk Score:  `1  Depression screen [facility] 2/9     02/11/2024    2:14 PM 10/08/2023   11:52 AM 07/30/2023    2:21 PM 04/02/2023    1:43 PM 01/22/2023    2:32 PM 12/28/2022    3:34 PM 10/27/2022    3:41 PM  Depression screen PHQ 2/9  Decreased Interest 0 1 1 2 1 1  0  Down, Depressed, Hopeless 1 1 1 2 1 1  0  PHQ - 2 Score 1 2 2 4 2 2  0  Altered sleeping 1  2  2     Tired, decreased energy 1  2  2     Change in appetite 0  0  0    Feeling bad or failure about yourself  0  0  0    Trouble concentrating 1  0  0    Moving slowly or fidgety/restless 0  0  0    Suicidal thoughts 0  0  0    PHQ-9 Score 4  6   6      Difficult doing work/chores Somewhat difficult    Somewhat difficult       Data saved with a previous flowsheet row definition    Review of Systems  Musculoskeletal:  Positive for back pain.       B/L arm pain  All other systems reviewed and are negative.      Objective:   Physical Exam        Assessment & Plan:   Pt is a 49 yr old female with dx of Fibromyalgia, HTN, and hypothyroidism- last TSH 1.07;  Also has hx of anxiety-  Has medullar sponge kidney.  Here for evaluation of fibromyalgia- got dx 10/21- but Sx's since 2017. Also has myofascial pain syndrome- CKD stage 2; and new dx of polycystic disease- as of 1/26. Not really at risk for dialysis any time soon.  Here for f/u on Fibromyalgia and to get Trigger point injections.     Try to get back on low sugar, low wheat, no nightshades diet.    2.  Continue OMT- with Dr Claudene- try to hold arm across your chest-  to use theracane- to open the rhomboids more.    3. Try to add more stretching- to day daily- add to daily shower stretches- hamstring stretches- get a tennis ball- or therapy ball.  Try to do in car- esp in car- to not add time to your day- has  30 minutes commute!  4. Co'nt tennis ball against the wall- for upper/mid back-  and low back.    5. Sounds like TrP injections flares up her FMS, so will not do currently.   6.  Get your annual  female exam- is on schedule-    7. Ask Dr Claudene- about OMT for SI joint on R. Because it can usally help a lot.    8.  Doesn't have time for PT currently- due to cost/time constraints- for R SI joint -- worth asking Dr Claudene if any OMT can do for SI joint.  9.   F/U- 6 months- for f/u on FMS and Myofascial pain syndrome- no Tpr Injections-    10. We discussed pillows and how to handle that. And sleep with pillow between/under knees.    I spent a total of 31   minutes on total care today- >50% coordination of care- due to d/w  pt about her myofascial pain- different techniques and  And d/w pillow and SI joint issues- and d/w pt about OMT and ergonomics.   "

## 2024-04-07 NOTE — Patient Instructions (Signed)
 Pt is a 49 yr old female with dx of Fibromyalgia, HTN, and hypothyroidism- last TSH 1.07;  Also has hx of anxiety-  Has medullar sponge kidney.  Here for evaluation of fibromyalgia- got dx 10/21- but Sx's since 2017. Also has myofascial pain syndrome- CKD stage 2; and new dx of polycystic disease- as of 1/26. Not really at risk for dialysis any time soon.  Here for f/u on Fibromyalgia and to get Trigger point injections.     Try to get back on low sugar, low wheat, no nightshades diet.    2.  Continue OMT- with Dr Claudene   3. Try to add more stretching- to day daily- add to daily shower stretches- hamstring stretches- get a tennis ball- or therapy ball.  Try to do in car- esp in car- to not add time to your day- has 30 minutes commute!  4. Co'nt tennis ball against the wall- for upper/mid back-  and low back.    5. Sounds like TrP injections flares up her FMS, so will not do currently.   6.  Get your annual  female exam- is on schedule-    7. Ask Dr Claudene- about OMT for SI joint on R. Because it can usally help a lot.    8.  Doesn't have time for PT currently- due to cost/time constraints- for R SI joint -- worth asking Dr Claudene if any OMT can do for SI joint.  9.   F/U- 6 months- for f/u o FMS and Myofascial pain syndrome- no Tpr Injections-

## 2024-04-10 ENCOUNTER — Other Ambulatory Visit: Payer: Self-pay

## 2024-04-11 ENCOUNTER — Other Ambulatory Visit: Payer: Self-pay

## 2024-04-12 NOTE — Progress Notes (Unsigned)
 " [name] [address] [place] [address] Phone: [phone] Subjective:   [name], am serving as a scribe for Dr. Arthea Claudene.  I'm seeing this patient by the request  of:  Geofm Glade PARAS, MD  CC: Neck, bilateral shoulder, back pain  YEP:Dlagzrupcz  [name] is a 49 y.o. female coming in with complaint of back and neck pain. OMT on 02/18/2024. Patient states that her lower back is bothering her over R SI joint. Also has pain in R cervical spine across the R shoulder with rotation.   Also having pain in the [facility] joints and B traps. Sore to touch.   Medications patient has been prescribed: Viibyrd trazodone   Taking:         Reviewed prior external information including notes and imaging from previsou exam, outside providers and external EMR if available.   As well as notes that were available from care everywhere and other healthcare systems.  Past medical history, social, surgical and family history all reviewed in electronic medical record.  No pertanent information unless stated regarding to the chief complaint.   Past Medical History:  Diagnosis Date   Allergy    Autoimmune disorder    Crohn's disease (HCC)    Endometriosis    Hypertension    Kidney, medullary sponge    Ovarian cyst    Pre-eclampsia    Raynaud's disease    Stress incontinence in female     Allergies[1]   Review of Systems:  No  visual changes, nausea, vomiting, diarrhea, constipation, dizziness, abdominal pain, skin rash, fevers, chills, night sweats, weight loss, swollen lymph nodes, joint swelling, chest pain, shortness of breath, mood changes. POSITIVE muscle aches, headaches, muscle aches  Objective  Blood pressure 128/88, height 5' 2 (1.575 m), weight 149 lb (67.6 kg).   General: No apparent distress alert and oriented x3 mood and affect normal, dressed appropriately.  HEENT: Pupils equal, extraocular movements intact   Respiratory: Patient's speak in full sentences and does not appear short of breath  Cardiovascular: No lower extremity edema, non tender, no erythema  Gait relatively normal MSK:  Back does have some loss of lordosis noted.  Patient does have tightness noted in the parascapular area bilaterally.  No significant stiffness with sidebending bilaterally.  Osteopathic findings  C2 flexed rotated and side bent right C6 flexed rotated and side bent left T3 extended rotated and side bent right inhaled rib T9 extended rotated and side bent left L2 flexed rotated and side bent right L3 flexed rotated and side bent left Sacrum right on right       Assessment and Plan:  Scapular dysfunction Scapular dyskinesis noted.  Discussed icing regimen and home exercises, which activities to do and which ones to avoid.  Increase activity slowly.  Discussed icing regimen.  Follow-up again in 6 to 12 weeks.  SI (sacroiliac) joint dysfunction Chronic problem noted, discussed avoiding certain activities.  Patient is going to continue to work on hip abductor strengthening.  Follow-up again in 6 to 12 weeks.    Nonallopathic problems  Decision today to treat with OMT was based on Physical Exam  After verbal consent patient was treated with HVLA, ME, FPR techniques in cervical, rib, thoracic, lumbar, and sacral  areas  Patient tolerated the procedure well with improvement in symptoms  Patient given exercises, stretches and lifestyle modifications  See medications in patient instructions if given  Patient will follow up in 4-8 weeks  The above documentation has been reviewed and is accurate and complete Lorraine Sun M Kiki Bivens, DO          Note: This dictation was prepared with Dragon dictation along with smaller phrase technology. Any transcriptional errors that result from this process are unintentional.            [1]  Allergies Allergen Reactions   Lamisil [Terbinafine] Hives and  Itching    In pill form   "

## 2024-04-14 ENCOUNTER — Ambulatory Visit: Admitting: Family Medicine

## 2024-04-14 ENCOUNTER — Encounter: Payer: Self-pay | Admitting: Family Medicine

## 2024-04-14 VITALS — BP 128/88 | Ht 62.0 in | Wt 149.0 lb

## 2024-04-14 DIAGNOSIS — M9901 Segmental and somatic dysfunction of cervical region: Secondary | ICD-10-CM

## 2024-04-14 DIAGNOSIS — M9902 Segmental and somatic dysfunction of thoracic region: Secondary | ICD-10-CM

## 2024-04-14 DIAGNOSIS — M899 Disorder of bone, unspecified: Secondary | ICD-10-CM

## 2024-04-14 DIAGNOSIS — M9904 Segmental and somatic dysfunction of sacral region: Secondary | ICD-10-CM

## 2024-04-14 DIAGNOSIS — M9908 Segmental and somatic dysfunction of rib cage: Secondary | ICD-10-CM

## 2024-04-14 DIAGNOSIS — M9903 Segmental and somatic dysfunction of lumbar region: Secondary | ICD-10-CM

## 2024-04-14 DIAGNOSIS — M533 Sacrococcygeal disorders, not elsewhere classified: Secondary | ICD-10-CM

## 2024-04-14 NOTE — Assessment & Plan Note (Signed)
 Chronic problem noted, discussed avoiding certain activities.  Patient is going to continue to work on hip abductor strengthening.  Follow-up again in 6 to 12 weeks.

## 2024-04-14 NOTE — Assessment & Plan Note (Signed)
 Scapular dyskinesis noted.  Discussed icing regimen and home exercises, which activities to do and which ones to avoid.  Increase activity slowly.  Discussed icing regimen.  Follow-up again in 6 to 12 weeks.

## 2024-04-14 NOTE — Patient Instructions (Signed)
 Good to see you You are doing well Trazadone next 3 night See me in 2 months

## 2024-06-23 ENCOUNTER — Ambulatory Visit: Admitting: Family Medicine

## 2024-08-25 ENCOUNTER — Encounter: Admitting: Internal Medicine

## 2024-10-06 ENCOUNTER — Encounter: Admitting: Physical Medicine and Rehabilitation
